# Patient Record
Sex: Female | Born: 1960 | Race: White | Hispanic: No | Smoking: Current every day smoker
Health system: Southern US, Community
[De-identification: ages and names within clinical notes are randomized; demographics above are authoritative.]

## PROBLEM LIST (undated history)

## (undated) DIAGNOSIS — J449 Chronic obstructive pulmonary disease, unspecified: Secondary | ICD-10-CM

## (undated) DIAGNOSIS — R519 Headache, unspecified: Secondary | ICD-10-CM

## (undated) DIAGNOSIS — G620 Drug-induced polyneuropathy: Secondary | ICD-10-CM

## (undated) DIAGNOSIS — F1910 Other psychoactive substance abuse, uncomplicated: Secondary | ICD-10-CM

## (undated) DIAGNOSIS — Z5189 Encounter for other specified aftercare: Secondary | ICD-10-CM

## (undated) DIAGNOSIS — E7800 Pure hypercholesterolemia, unspecified: Secondary | ICD-10-CM

## (undated) DIAGNOSIS — C50919 Malignant neoplasm of unspecified site of unspecified female breast: Secondary | ICD-10-CM

## (undated) DIAGNOSIS — F419 Anxiety disorder, unspecified: Secondary | ICD-10-CM

## (undated) DIAGNOSIS — R112 Nausea with vomiting, unspecified: Secondary | ICD-10-CM

## (undated) DIAGNOSIS — J439 Emphysema, unspecified: Secondary | ICD-10-CM

## (undated) DIAGNOSIS — G629 Polyneuropathy, unspecified: Secondary | ICD-10-CM

## (undated) DIAGNOSIS — F32A Depression, unspecified: Secondary | ICD-10-CM

## (undated) DIAGNOSIS — R51 Headache: Secondary | ICD-10-CM

## (undated) DIAGNOSIS — Z9889 Other specified postprocedural states: Secondary | ICD-10-CM

## (undated) DIAGNOSIS — T451X5A Adverse effect of antineoplastic and immunosuppressive drugs, initial encounter: Secondary | ICD-10-CM

## (undated) DIAGNOSIS — F329 Major depressive disorder, single episode, unspecified: Secondary | ICD-10-CM

## (undated) DIAGNOSIS — K219 Gastro-esophageal reflux disease without esophagitis: Secondary | ICD-10-CM

## (undated) DIAGNOSIS — F4310 Post-traumatic stress disorder, unspecified: Secondary | ICD-10-CM

## (undated) DIAGNOSIS — T7840XA Allergy, unspecified, initial encounter: Secondary | ICD-10-CM

## (undated) DIAGNOSIS — E785 Hyperlipidemia, unspecified: Secondary | ICD-10-CM

## (undated) HISTORY — DX: Polyneuropathy, unspecified: G62.9

## (undated) HISTORY — DX: Depression, unspecified: F32.A

## (undated) HISTORY — PX: MASTECTOMY: SHX3

## (undated) HISTORY — DX: Anxiety disorder, unspecified: F41.9

## (undated) HISTORY — DX: Malignant neoplasm of unspecified site of unspecified female breast: C50.919

## (undated) HISTORY — DX: Nausea with vomiting, unspecified: R11.2

## (undated) HISTORY — DX: Encounter for other specified aftercare: Z51.89

## (undated) HISTORY — DX: Hyperlipidemia, unspecified: E78.5

## (undated) HISTORY — PX: TUBAL LIGATION: SHX77

## (undated) HISTORY — DX: Headache: R51

## (undated) HISTORY — DX: Adverse effect of antineoplastic and immunosuppressive drugs, initial encounter: T45.1X5A

## (undated) HISTORY — DX: Other psychoactive substance abuse, uncomplicated: F19.10

## (undated) HISTORY — PX: BREAST SURGERY: SHX581

## (undated) HISTORY — DX: Headache, unspecified: R51.9

## (undated) HISTORY — DX: Gastro-esophageal reflux disease without esophagitis: K21.9

## (undated) HISTORY — DX: Chronic obstructive pulmonary disease, unspecified: J44.9

## (undated) HISTORY — DX: Major depressive disorder, single episode, unspecified: F32.9

## (undated) HISTORY — DX: Emphysema, unspecified: J43.9

## (undated) HISTORY — DX: Other specified postprocedural states: Z98.890

## (undated) HISTORY — DX: Drug-induced polyneuropathy: G62.0

## (undated) HISTORY — DX: Allergy, unspecified, initial encounter: T78.40XA

---

## 1999-02-22 ENCOUNTER — Emergency Department (HOSPITAL_COMMUNITY): Admission: EM | Admit: 1999-02-22 | Discharge: 1999-02-22 | Payer: Self-pay | Admitting: Emergency Medicine

## 1999-02-22 ENCOUNTER — Encounter: Payer: Self-pay | Admitting: Emergency Medicine

## 1999-03-21 ENCOUNTER — Ambulatory Visit (HOSPITAL_COMMUNITY): Admission: RE | Admit: 1999-03-21 | Discharge: 1999-03-21 | Payer: Self-pay | Admitting: Orthopedic Surgery

## 1999-03-21 ENCOUNTER — Encounter: Payer: Self-pay | Admitting: Orthopedic Surgery

## 1999-06-12 ENCOUNTER — Encounter: Payer: Self-pay | Admitting: Emergency Medicine

## 1999-06-12 ENCOUNTER — Emergency Department (HOSPITAL_COMMUNITY): Admission: EM | Admit: 1999-06-12 | Discharge: 1999-06-12 | Payer: Self-pay | Admitting: Emergency Medicine

## 1999-07-17 ENCOUNTER — Encounter: Admission: RE | Admit: 1999-07-17 | Discharge: 1999-10-15 | Payer: Self-pay | Admitting: *Deleted

## 2000-04-09 ENCOUNTER — Encounter: Admission: RE | Admit: 2000-04-09 | Discharge: 2000-04-09 | Payer: Self-pay | Admitting: Family Medicine

## 2000-04-15 ENCOUNTER — Encounter: Payer: Self-pay | Admitting: *Deleted

## 2000-04-15 ENCOUNTER — Encounter: Admission: RE | Admit: 2000-04-15 | Discharge: 2000-04-15 | Payer: Self-pay | Admitting: *Deleted

## 2001-04-27 ENCOUNTER — Encounter: Payer: Self-pay | Admitting: Emergency Medicine

## 2001-04-27 ENCOUNTER — Emergency Department (HOSPITAL_COMMUNITY): Admission: EM | Admit: 2001-04-27 | Discharge: 2001-04-27 | Payer: Self-pay | Admitting: Emergency Medicine

## 2002-10-05 ENCOUNTER — Emergency Department (HOSPITAL_COMMUNITY): Admission: EM | Admit: 2002-10-05 | Discharge: 2002-10-05 | Payer: Self-pay | Admitting: Emergency Medicine

## 2005-12-25 ENCOUNTER — Ambulatory Visit (HOSPITAL_COMMUNITY): Admission: RE | Admit: 2005-12-25 | Discharge: 2005-12-25 | Payer: Self-pay | Admitting: Family Medicine

## 2006-06-12 ENCOUNTER — Ambulatory Visit (HOSPITAL_COMMUNITY): Admission: RE | Admit: 2006-06-12 | Discharge: 2006-06-12 | Payer: Self-pay | Admitting: Family Medicine

## 2006-06-12 ENCOUNTER — Encounter (INDEPENDENT_AMBULATORY_CARE_PROVIDER_SITE_OTHER): Payer: Self-pay | Admitting: Diagnostic Radiology

## 2006-06-12 ENCOUNTER — Encounter (INDEPENDENT_AMBULATORY_CARE_PROVIDER_SITE_OTHER): Payer: Self-pay | Admitting: *Deleted

## 2006-06-24 ENCOUNTER — Encounter: Admission: RE | Admit: 2006-06-24 | Discharge: 2006-06-24 | Payer: Self-pay | Admitting: Family Medicine

## 2006-06-24 ENCOUNTER — Encounter (INDEPENDENT_AMBULATORY_CARE_PROVIDER_SITE_OTHER): Payer: Self-pay | Admitting: *Deleted

## 2006-06-26 ENCOUNTER — Encounter: Admission: RE | Admit: 2006-06-26 | Discharge: 2006-06-26 | Payer: Self-pay | Admitting: General Surgery

## 2006-07-05 ENCOUNTER — Encounter (INDEPENDENT_AMBULATORY_CARE_PROVIDER_SITE_OTHER): Payer: Self-pay | Admitting: General Surgery

## 2006-07-05 ENCOUNTER — Encounter (INDEPENDENT_AMBULATORY_CARE_PROVIDER_SITE_OTHER): Payer: Self-pay | Admitting: Specialist

## 2006-07-05 ENCOUNTER — Inpatient Hospital Stay (HOSPITAL_COMMUNITY): Admission: RE | Admit: 2006-07-05 | Discharge: 2006-07-08 | Payer: Self-pay | Admitting: General Surgery

## 2006-07-24 ENCOUNTER — Encounter (HOSPITAL_COMMUNITY): Admission: RE | Admit: 2006-07-24 | Discharge: 2006-08-23 | Payer: Self-pay | Admitting: Oncology

## 2006-07-24 ENCOUNTER — Ambulatory Visit (HOSPITAL_COMMUNITY): Payer: Self-pay | Admitting: Oncology

## 2006-07-25 ENCOUNTER — Ambulatory Visit: Payer: Self-pay | Admitting: Internal Medicine

## 2006-07-29 ENCOUNTER — Ambulatory Visit (HOSPITAL_COMMUNITY): Admission: RE | Admit: 2006-07-29 | Discharge: 2006-07-29 | Payer: Self-pay | Admitting: General Surgery

## 2006-08-08 ENCOUNTER — Emergency Department (HOSPITAL_COMMUNITY): Admission: EM | Admit: 2006-08-08 | Discharge: 2006-08-08 | Payer: Self-pay | Admitting: Emergency Medicine

## 2006-08-28 ENCOUNTER — Encounter (HOSPITAL_COMMUNITY): Admission: RE | Admit: 2006-08-28 | Discharge: 2006-09-27 | Payer: Self-pay | Admitting: Oncology

## 2006-09-05 ENCOUNTER — Ambulatory Visit: Payer: Self-pay | Admitting: Cardiology

## 2006-09-12 ENCOUNTER — Ambulatory Visit (HOSPITAL_COMMUNITY): Payer: Self-pay | Admitting: Oncology

## 2006-10-02 ENCOUNTER — Encounter (HOSPITAL_COMMUNITY): Admission: RE | Admit: 2006-10-02 | Discharge: 2006-11-01 | Payer: Self-pay | Admitting: Oncology

## 2006-11-04 ENCOUNTER — Ambulatory Visit: Admission: RE | Admit: 2006-11-04 | Discharge: 2007-01-07 | Payer: Self-pay | Admitting: Radiation Oncology

## 2006-11-25 ENCOUNTER — Encounter (HOSPITAL_COMMUNITY): Admission: RE | Admit: 2006-11-25 | Discharge: 2006-12-25 | Payer: Self-pay | Admitting: Oncology

## 2006-11-29 ENCOUNTER — Ambulatory Visit (HOSPITAL_COMMUNITY): Payer: Self-pay | Admitting: Oncology

## 2007-01-20 ENCOUNTER — Ambulatory Visit (HOSPITAL_COMMUNITY): Payer: Self-pay | Admitting: Oncology

## 2007-01-20 ENCOUNTER — Encounter (HOSPITAL_COMMUNITY): Admission: RE | Admit: 2007-01-20 | Discharge: 2007-02-04 | Payer: Self-pay | Admitting: Oncology

## 2007-03-03 ENCOUNTER — Encounter (HOSPITAL_COMMUNITY): Admission: RE | Admit: 2007-03-03 | Discharge: 2007-04-02 | Payer: Self-pay | Admitting: Oncology

## 2007-03-18 ENCOUNTER — Encounter (INDEPENDENT_AMBULATORY_CARE_PROVIDER_SITE_OTHER): Payer: Self-pay | Admitting: Internal Medicine

## 2007-03-18 ENCOUNTER — Other Ambulatory Visit: Admission: RE | Admit: 2007-03-18 | Discharge: 2007-03-18 | Payer: Self-pay | Admitting: Internal Medicine

## 2007-05-12 ENCOUNTER — Encounter (HOSPITAL_COMMUNITY): Admission: RE | Admit: 2007-05-12 | Discharge: 2007-06-11 | Payer: Self-pay | Admitting: Oncology

## 2007-05-21 ENCOUNTER — Emergency Department (HOSPITAL_COMMUNITY): Admission: EM | Admit: 2007-05-21 | Discharge: 2007-05-21 | Payer: Self-pay | Admitting: Emergency Medicine

## 2007-05-27 ENCOUNTER — Ambulatory Visit (HOSPITAL_COMMUNITY): Payer: Self-pay | Admitting: Oncology

## 2007-06-06 ENCOUNTER — Ambulatory Visit (HOSPITAL_COMMUNITY): Admission: RE | Admit: 2007-06-06 | Discharge: 2007-06-06 | Payer: Self-pay | Admitting: Neurology

## 2007-08-11 ENCOUNTER — Ambulatory Visit (HOSPITAL_COMMUNITY): Payer: Self-pay | Admitting: Oncology

## 2007-08-11 ENCOUNTER — Encounter (HOSPITAL_COMMUNITY): Admission: RE | Admit: 2007-08-11 | Discharge: 2007-09-10 | Payer: Self-pay | Admitting: Oncology

## 2007-11-16 ENCOUNTER — Emergency Department (HOSPITAL_COMMUNITY): Admission: EM | Admit: 2007-11-16 | Discharge: 2007-11-17 | Payer: Self-pay | Admitting: Emergency Medicine

## 2008-02-11 ENCOUNTER — Encounter (HOSPITAL_COMMUNITY): Admission: RE | Admit: 2008-02-11 | Discharge: 2008-03-12 | Payer: Self-pay | Admitting: Oncology

## 2008-02-11 ENCOUNTER — Ambulatory Visit (HOSPITAL_COMMUNITY): Payer: Self-pay | Admitting: Oncology

## 2008-03-02 ENCOUNTER — Ambulatory Visit: Payer: Self-pay | Admitting: Gastroenterology

## 2008-03-10 ENCOUNTER — Encounter: Payer: Self-pay | Admitting: Gastroenterology

## 2008-03-10 ENCOUNTER — Ambulatory Visit: Payer: Self-pay | Admitting: Gastroenterology

## 2008-03-10 ENCOUNTER — Ambulatory Visit (HOSPITAL_COMMUNITY): Admission: RE | Admit: 2008-03-10 | Discharge: 2008-03-10 | Payer: Self-pay | Admitting: Gastroenterology

## 2008-03-10 HISTORY — PX: OTHER SURGICAL HISTORY: SHX169

## 2008-03-10 HISTORY — PX: ESOPHAGOGASTRODUODENOSCOPY: SHX1529

## 2008-04-20 ENCOUNTER — Emergency Department (HOSPITAL_COMMUNITY): Admission: EM | Admit: 2008-04-20 | Discharge: 2008-04-20 | Payer: Self-pay | Admitting: Emergency Medicine

## 2008-04-27 ENCOUNTER — Emergency Department (HOSPITAL_COMMUNITY): Admission: EM | Admit: 2008-04-27 | Discharge: 2008-04-27 | Payer: Self-pay | Admitting: Emergency Medicine

## 2008-08-11 ENCOUNTER — Ambulatory Visit (HOSPITAL_COMMUNITY): Payer: Self-pay | Admitting: Oncology

## 2009-02-14 ENCOUNTER — Ambulatory Visit (HOSPITAL_COMMUNITY): Payer: Self-pay | Admitting: Oncology

## 2009-04-06 ENCOUNTER — Encounter: Admission: RE | Admit: 2009-04-06 | Discharge: 2009-04-06 | Payer: Self-pay | Admitting: Family Medicine

## 2009-07-11 ENCOUNTER — Encounter: Admission: RE | Admit: 2009-07-11 | Discharge: 2009-07-11 | Payer: Self-pay | Admitting: Family Medicine

## 2009-08-15 ENCOUNTER — Ambulatory Visit (HOSPITAL_COMMUNITY): Payer: Self-pay | Admitting: Oncology

## 2009-08-15 ENCOUNTER — Encounter (HOSPITAL_COMMUNITY): Admission: RE | Admit: 2009-08-15 | Discharge: 2009-09-14 | Payer: Self-pay | Admitting: Oncology

## 2009-09-01 ENCOUNTER — Ambulatory Visit (HOSPITAL_BASED_OUTPATIENT_CLINIC_OR_DEPARTMENT_OTHER): Admission: RE | Admit: 2009-09-01 | Discharge: 2009-09-01 | Payer: Self-pay | Admitting: Orthopedic Surgery

## 2010-02-28 ENCOUNTER — Encounter (HOSPITAL_COMMUNITY): Admission: RE | Admit: 2010-02-28 | Payer: Self-pay | Admitting: Neurosurgery

## 2010-03-28 ENCOUNTER — Encounter (HOSPITAL_COMMUNITY)
Admission: RE | Admit: 2010-03-28 | Discharge: 2010-04-27 | Payer: Self-pay | Source: Home / Self Care | Attending: Oncology | Admitting: Oncology

## 2010-03-28 ENCOUNTER — Ambulatory Visit (HOSPITAL_COMMUNITY): Payer: Self-pay | Admitting: Oncology

## 2010-05-28 ENCOUNTER — Encounter (HOSPITAL_COMMUNITY): Payer: Self-pay | Admitting: Oncology

## 2010-05-29 ENCOUNTER — Encounter (HOSPITAL_COMMUNITY): Payer: Self-pay | Admitting: Oncology

## 2010-07-18 LAB — DIFFERENTIAL
Eosinophils Absolute: 0.2 10*3/uL (ref 0.0–0.7)
Eosinophils Relative: 3 % (ref 0–5)
Lymphs Abs: 2.1 10*3/uL (ref 0.7–4.0)

## 2010-07-18 LAB — COMPREHENSIVE METABOLIC PANEL
ALT: 14 U/L (ref 0–35)
AST: 18 U/L (ref 0–37)
CO2: 28 mEq/L (ref 19–32)
Calcium: 9.5 mg/dL (ref 8.4–10.5)
Chloride: 107 mEq/L (ref 96–112)
GFR calc Af Amer: >60 mL/min (ref >60)
GFR calc non Af Amer: >60 mL/min (ref >60)
Sodium: 141 mEq/L (ref 135–145)
Total Bilirubin: 0.4 mg/dL (ref 0.3–1.2)

## 2010-07-18 LAB — CANCER ANTIGEN 27.29: CA 27.29: 11 U/mL (ref 0–39)

## 2010-07-18 LAB — CBC
Hemoglobin: 14 g/dL (ref 12.0–15.0)
RBC: 4.54 MIL/uL (ref 3.87–5.11)

## 2010-07-18 LAB — SEDIMENTATION RATE: Sed Rate: 10 mm/hr (ref 0–22)

## 2010-07-26 LAB — DIFFERENTIAL
Basophils Absolute: 0 10*3/uL (ref 0.0–0.1)
Basophils Relative: 1 % (ref 0–1)
Lymphocytes Relative: 35 % (ref 12–46)
Neutro Abs: 3.2 10*3/uL (ref 1.7–7.7)
Neutrophils Relative %: 57 % (ref 43–77)

## 2010-07-26 LAB — CBC
HCT: 37.7 % (ref 36.0–46.0)
Hemoglobin: 13.1 g/dL (ref 12.0–15.0)
MCV: 86.1 fL (ref 78.0–100.0)
Platelets: 281 10*3/uL (ref 150–400)
WBC: 5.5 10*3/uL (ref 4.0–10.5)

## 2010-07-26 LAB — COMPREHENSIVE METABOLIC PANEL
BUN: 7 mg/dL (ref 6–23)
CO2: 27 mEq/L (ref 19–32)
Chloride: 103 mEq/L (ref 96–112)
Creatinine, Ser: 0.56 mg/dL (ref 0.4–1.2)
GFR calc non Af Amer: >60 mL/min (ref >60)
Glucose, Bld: 82 mg/dL (ref 70–99)
Total Bilirubin: 0.3 mg/dL (ref 0.3–1.2)

## 2010-09-19 NOTE — Procedures (Signed)
[name], Judy               ACCOUNT NO.:  [id number]   MEDICAL RECORD NO.:  [id number]           PATIENT TYPE:  REC   LOCATION:                                FACILITY:  [facility]   PHYSICIAN:  Gerrit Friends. Dietrich Pates, MD, FACCDATE OF BIRTH:  04-30-1961   DATE OF PROCEDURE:  09/05/2006  DATE OF DISCHARGE:                                ECHOCARDIOGRAM   REFERRING:  Nystrom.   CLINICAL DATA:  A 50 year old woman receiving chemotherapy.   Aorta 3.0, left atrium 3.1, septum 0.9, posterior wall 0.8, LV diastole  4.9, LV systole 3.5.  1. Technically adequate echocardiographic study.  2. Normal left atrium and right atrium.  3. Right ventricular size is prominent from the parasternal long axis      but normal from other views; no RVH; normal RV systolic function.  4. Normal pulmonic valve and proximal pulmonary artery.  5. Normal proximal ascending aorta; mild calcification of the wall.  6. Normal and trileaflet aortic valve.  7. Normal mitral and tricuspid valve; physiologic tricuspid      regurgitation.  8. Normal left ventricular size; no LVH; normal regional and global LV      systolic function.  Estimated ejection fraction is 0.65.  9. Normal IVC.      Gerrit Friends. Dietrich Pates, MD, [facility]  Electronically Signed     RMR/MEDQ  D:  09/05/2006  T:  09/05/2006  Job:  [id number]

## 2010-09-19 NOTE — Op Note (Signed)
[name], [name]               ACCOUNT NO.:  [id number]   MEDICAL RECORD NO.:  [id number]          PATIENT TYPE:  AMB   LOCATION:  DAY                           FACILITY:  [facility]   PHYSICIAN:  Kassie Mends, M.D.      DATE OF BIRTH:  [birth year]   DATE OF PROCEDURE:  03/10/2008  DATE OF DISCHARGE:                               OPERATIVE REPORT   REFERRING PROVIDERS:  1. [facility] Department.  2. Ladona Horns. Neijstrom, MD   PROCEDURES:  1. Ileocolonoscopy with random cold forceps biopsy.  2. Esophagogastroduodenoscopy with cold forceps biopsy of the gastric      and duodenal mucosa.   INDICATION FOR EXAMINATION:  [name] is a 50 year old female who  complains of intermittent nausea, vomiting, and diarrhea since the age  of 28.  She initially thought it was related to a kerosene heater.  She  denies any problem swallowing.  She reports not eating much, but she  has not had any weight loss.  Her BMI is 32.4(obese). She says she  snacks on bologna sandwiches and Little Debbies.  She has been taking  Prilosec.  She reports taking Prilosec daily for the last 6 months.  She  reports having 2-3 loose stools a day.  She does take ibuprofen daily.  She had breast cancer in February 2008, which resulted in a mastectomy,  radiation therapy, and chemotherapy.   FINDINGS:  1. Normal terminal ileum, approximately 10 cm visualized.  2. Normal colon without evidence of polyps, mass, inflammatory      changes, diverticula, or AVMs.  Biopsies obtained via cold forceps      to evaluate for microscopic colitis.  3. Normal retroflexed view of the rectum.  The retroflexed view had to      be performed with a therapeutic gastroscope.  4. Normal esophagus without evidence of Barrett, mass, erosion,      ulceration, or stricture.  5. Patchy erythema in the antrum without erosion or ulceration.      Biopsies obtained via cold forceps to evaluate for Helicobacter      pylori gastritis.  6.  Normal duodenal bulb and second portion of the duodenum.  Mild bile      staining.  Biopsies obtained via cold forceps to evaluate for      celiac sprue as an etiology for her chronic diarrhea.   RECOMMENDATIONS:  1. Screening colonoscopy in 10 years.  She should follow a lactose-      free and high-fiber diet.  She was given a handout on both.  2. She should continue the Prilosec and take it 30 minutes before her      first meal.  3. No aspirin, NSAIDs, or anticoagulation for 7 days.  She may use      Tylenol for headaches.  4. She was given information on gastritis.  5. If her biopsies do not reveal an etiology for nausea, vomiting, and      diarrhea, then would consider a gastric emptying study or a breath      test for lactose or fructose intolerance.  MEDICATIONS:  1. Demerol 100 mg IV.  2. Versed 4 mg IV.  3. Phenergan 25 mg IV.   PROCEDURE TECHNIQUE:  Physical exam was performed.  Informed consent was  obtained from the patient after explaining the benefits, risks, and  alternatives of the procedure.  The patient was connected to the monitor  and placed in the left lateral position.  Continuous oxygen was provided  by nasal cannula.  IV medicine administered through an indwelling  cannula.  After administration of sedation and rectal exam, the  patient's rectum was intubated and the scope was advanced under direct  visualization to the distal terminal ileum.  The scope was removed  slowly by carefully examining the color, texture, anatomy, and integrity  of the mucosa on the way out.   After the colonoscopy, the patient's esophagus was intubated with a  diagnostic gastroscope and the scope was advanced under direct  visualization to the second portion of the duodenum.  The scope was  removed slowly by carefully examining the color, texture, anatomy, and  integrity of the mucosa on the way out.  The patient was recovered in  endoscopy and discharged home in satisfactory  condition.   PATH:  Reactive gastropathy. Nl duodenum/colon.      Kassie Mends, M.D.  Electronically Signed     SM/MEDQ  D:  03/10/2008  T:  03/10/2008  Job:  [id number]   cc:   [facility] Department   Ladona Horns. Mariel Sleet, MD  Fax: [phone]

## 2010-09-19 NOTE — Procedures (Signed)
[name], [name]               ACCOUNT NO.:  [id number]   MEDICAL RECORD NO.:  [id number]          PATIENT TYPE:  REC   LOCATION:  RAD                           FACILITY:  [facility]   PHYSICIAN:  Madaline Savage, M.D.DATE OF BIRTH:  1960/12/28   DATE OF PROCEDURE:  10/02/2006  DATE OF DISCHARGE:                                ECHOCARDIOGRAM   INDICATIONS FOR PROCEDURE:  Left ventricular function assessment in a  patient with breast cancer.   Technically this study is suboptimal for interpretation results.   Left ventricle:  Left ventricular size is normal with LV systolic  dimension being 3.3 and LV diastolic dimension 4.6.  There is normal LV  size overall.  The septal and posterior wall dimensions of the left  ventricle are normal at between 0.7 and 0.9.  Left ventricular systolic  function looks to be between 45 and 55.  Segmental wall motion  abnormalities are not seen but cannot be ruled out due to the technical  quality of the study.  No obvious LV thrombus was seen.   Right ventricle:  Not well seen.   Right atrium:  Not well seen.   Left atrium:  Normal left atrial size 1.9.   Aorta:  Normal.  Normal aortic root dimension of 2.4.   Aortic valve:  Aortic valve was normal in appearance with no evidence of  stenosis or regurgitation.   Mitral valve looked normal.   Pulmonic valve was not seen.   Tricuspid valve was not seen.   Pericardium:  No obvious effusion.   FINAL DIAGNOSIS:  1. Left ventricular systolic function as described above.  2. No evidence of any major valvular pathology.           ______________________________  Madaline Savage, M.D.     WHG/MEDQ  D:  10/02/2006  T:  10/03/2006  Job:  [id number]   cc:   Ladona Horns. Mariel Sleet, MD  Fax: [phone]

## 2010-09-19 NOTE — Consult Note (Signed)
NAME:  [name], [name]               ACCOUNT NO.:  [id number]   MEDICAL RECORD NO.:  [id number]          PATIENT TYPE:  AMB   LOCATION:  DAY                           FACILITY:  [facility]   PHYSICIAN:  Kassie Mends, M.D.      DATE OF BIRTH:  1960-12-24   DATE OF CONSULTATION:  03/02/2008  DATE OF DISCHARGE:                                 CONSULTATION   REASON FOR CONSULTATION:  Chronic nausea, vomiting, and diarrhea.   REQUESTING PHYSICIAN:  Ladona Horns. Mariel Sleet, MD.   PRIMARY CARE PHYSICIAN:  [facility] Department.   HISTORY OF PRESENT ILLNESS:  The patient is a 50 year old lady who has a  history of bilateral breast cancer, status post right modified radical  mastectomy, in the left simple mastectomy back in February 2008, status  post radiation therapy and chemotherapy who presents today for further  evaluation of chronic vomiting and diarrhea.  Currently, she is in the  final stages of her reconstructive surgery.  She has finished her cancer  treatments.  She states for the past 10 years, she has had chronic  vomiting and diarrhea.  She may have vomiting daily for 2 weeks and then  may go a few weeks without any symptoms.  Generally, she wakes up early  in the morning with nausea.  Progresses throughout the morning and it  may last all day.  In between episodes of vomiting, she continues to  eat.  She generally only eats liquid diet, however.  She also complains  of chronic diarrhea.  She is very difficult in giving a history,  however.  She notes little more food she eats, more diarrhea she does  have.  Generally, she may have 2 or 3 stools a day.  Lately, she does  feel that she has been consuming very much and therefore, has not had 2  bouts of diarrhea.  She notes however, that she has a long-standing  weight.  She does have some intermittent heartburn, but takes Prilosec  as needed.  She denies any dysphagia or odynophagia.  She states a  couple of weeks ago, she had  black stool.  This may have been related to  Pepto-Bismol.  She had been on ibuprofen about 400 mg 3 times a day up  until last week when her Celexa and Ativan were increased.  She was  taking ibuprofen for headaches.   CURRENT MEDICATIONS:  1. Celexa 40 mg daily.  2. Wellbutrin 300 mg daily.  3. Ativan 1 mg t.i.d.  4. Prilosec 20 mg daily p.r.n.  5. Ibuprofen 400 mg t.i.d. p.r.n.   ALLERGIES:  No known drug allergies.   PAST MEDICAL HISTORY:  Bilateral breast cancer diagnosed in February  2008, status post resection, chemo and radiation therapy.  She has  reconstructive surgery in December 2008.  Port-A-Cath placed on March  2008.  Tubal ligation in 1990.  On chest CT, she had apparent mild COPD.   FAMILY HISTORY:  Negative for breast cancer.  She has a brother with  hepatitis C.  One brother died with a drug overdose.  Father had skin  cancer.  Mother has history of MI, aneurysm, and emphysema.   SOCIAL HISTORY:  She is single.  She has 2 children.  She is unemployed.  She smokes half pack of cigarettes daily, smoked for over 13 years.  Rarely consumes alcohol.   REVIEW OF SYSTEMS:  See HPI for GI and constitutional.  CARDIOPULMONARY:  She denies any shortness of breath, dyspnea on exertion, chest pain,  palpitations, or cough.  GENITOURINARY:  Denies any dysuria or  hematuria.  She does not have any menses.   PHYSICAL EXAMINATION:  VITAL SIGNS:  Weight 189, height 5 feet 4 inches,  temperature 97.5, blood pressure 116/64, and pulse 84.  GENERAL:  Pleasant, well-nourished, and well-developed Caucasian female,  in no acute distress.  SKIN:  Warm and dry.  No jaundice.  HEENT:  Sclerae nonicteric.  Oropharyngeal mucosa moist and pink.  No  lesions, erythema, or exudate.  No lymphadenopathy or thyromegaly.  CHEST:  Lungs are clear to auscultation.  CARDIAC:  Regular rate and rhythm.  Normal S1 and S2.  No murmurs, rubs,  or gallops.  ABDOMEN:  Positive bowel sounds.  Abdomen,  soft, nondistended, and  nontender.  No organomegaly or masses.  No rebound or guarding.  No  abdominal bruits or hernias.  LOWER EXTREMITIES:  No edema.   PLAN:  1. Hemoccult stool x3.  2. Retrieve labs from Dr. Thornton Papas office done in a couple of weeks      ago.  3. She will continue Prilosec to take it 20 mg daily for now.  4. Further recommendations and to follow pending labs.  I suspect, the      patient will need to have EGD and colonoscopy for further      evaluation of her symptoms.      Tana Coast, P.A.      Kassie Mends, M.D.  Electronically Signed    LL/MEDQ  D:  03/02/2008  T:  03/03/2008  Job:  [id number]   cc:   Ladona Horns. Mariel Sleet, MD  Fax: [phone]   [facility] Department

## 2010-09-22 NOTE — Discharge Summary (Signed)
NAME:  [name], [name] NO.:  [id number]   MEDICAL RECORD NO.:  [id number]          PATIENT TYPE:  INP   LOCATION:  A311                          FACILITY:  [facility]   PHYSICIAN:  Dalia Heading, M.D.  DATE OF BIRTH:  1961/03/06   DATE OF ADMISSION:  07/05/2006  DATE OF DISCHARGE:  03/03/2008LH                               DISCHARGE SUMMARY   HOSPITAL COURSE SUMMARY:  The patient is a 50 year old white female with  a history of right breast carcinoma and a highly suspicious neoplasm in  the left breast.  She subsequently came to the hospital on 07/05/2006  and underwent a right modified radical mastectomy as well as a left  simple mastectomy.  She tolerated the procedures well.  The  postoperative course was remarkable for a hematoma that developed below  the left breast incision.  This was evacuated at bedside on  postoperative day  #1.  She did receive 1 unit packed red blood cells for surgical anemia.  Her hematocrit subsequently corrected to 30.  Her diet was advanced  without difficulty.  Final pathology is pending.   FOLLOW UP:  The patient is to follow up with Dr. Franky Macho on  07/11/2006 .   DISCHARGE MEDICATIONS:  Include Xanax p.r.n., Vicodin 1-2 tablets p.o.  q.4 h p.r.n. pain, multivitamin 1 tablet p.o. daily.  She is to drain  and record her bulb suction in the right axilla twice a day.   PRINCIPAL DIAGNOSES:  1. Right breast carcinoma.  2. Possible left breast carcinoma, final pathology pending.  3. Anemia, secondary to surgery.   PRINCIPAL PROCEDURE:  Right modified radical mastectomy, left simple  mastectomy on 07/05/2006.  Please send a copy of this dictation to my  office to the chart and to [facility] Department thank you      Dalia Heading, M.D.  Electronically Signed     MAJ/MEDQ  D:  07/08/2006  T:  07/08/2006  Job:  [id number]   cc:   Dalia Heading, M.D.  Fax: [phone]   [facility] Department

## 2010-09-22 NOTE — H&P (Signed)
NAME:  [name], [name]               ACCOUNT NO.:  [id number]   MEDICAL RECORD NO.:  [id number]          PATIENT TYPE:  AMB   LOCATION:  DAY                           FACILITY:  [facility]   PHYSICIAN:  Dalia Heading, M.D.  DATE OF BIRTH:  [birth year]   DATE OF ADMISSION:  DATE OF DISCHARGE:  LH                              HISTORY & PHYSICAL   CHIEF COMPLAINT:  Right breast carcinoma, question left breast carcinoma  with atypia.   HISTORY OF PRESENT ILLNESS:  The patient is a 50 year old white female  who is referred for evaluation and treatment of a right breast cancer.  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  She has had an MRI which shows these 2 areas, but no other  evidence of disease.  She has no family history of breast carcinoma.  After extensive discussion with the patient, she would like to proceed  with bilateral mastectomies.   PAST MEDICAL HISTORY:  Unremarkable.   PAST SURGICAL HISTORY:  Unremarkable.   CURRENT MEDICATIONS:  None.   ALLERGIES:  No known drug allergies.   REVIEW OF SYSTEMS:  The patient smokes a pack of cigarettes a day.  She  drinks alcohol rarely.   PHYSICAL EXAMINATION:  GENERAL:  The patient is a well-developed, well-  nourished white female in no acute distress.  NECK:  Supple without lymphadenopathy.  LUNGS:  Clear to auscultation with equal breath sounds bilaterally.  HEART:  Regular rate and rhythm without S3, S4, or murmurs.  BREASTS:  Right breast examination reveals a dominant mass noted in the  lower, outer quadrant of the breast.  No nipple discharge or dimpling is  noted.  Shoddy lymphadenopathy is noted.  Left breast examination  reveals no dominant mass, nipple discharge, or dimpling.  The axilla is  negative for palpable nodes.   IMPRESSION:  1.  Right breast carcinoma.  2. Left breast neoplasm.   PLAN:  The patient is scheduled for right modified radical mastectomy as  well as left simple mastectomy on July 05, 2006.  The risks and  benefits of the procedures were fully explained to the patient who gave  informed consent.      Dalia Heading, M.D.  Electronically Signed     MAJ/MEDQ  D:  07/04/2006  T:  07/04/2006  Job:  [id number]   cc:   [facility] Department

## 2010-09-22 NOTE — Op Note (Signed)
[name], [name]               ACCOUNT NO.:  [id number]   MEDICAL RECORD NO.:  [id number]          PATIENT TYPE:  AMB   LOCATION:  DAY                           FACILITY:  [facility]   PHYSICIAN:  Dalia Heading, M.D.  DATE OF BIRTH:  1960-11-06   DATE OF PROCEDURE:  07/29/2006  DATE OF DISCHARGE:                               OPERATIVE REPORT   PREOPERATIVE DIAGNOSIS:  Right breast carcinoma.   POSTOPERATIVE DIAGNOSIS:  Right breast carcinoma.   PROCEDURE:  Port-A-Cath insertion.   SURGEON:  Dalia Heading, M.D.   ANESTHESIA:  MAC.   INDICATIONS:  The patient is a 50 year old white female with a history  of breast carcinoma who now presents for Port-A-Cath insertion for  central venous access for chemotherapy.  The risks and benefits of the  procedure, including bleeding, infection, and pneumothorax were fully  explained to the patient who gave informed consent.   PROCEDURE NOTE:  The patient was placed in the Trendelenburg position  after the left upper chest was prepped and draped using the usual  sterile technique with Betadine.  Surgical site confirmation was  performed.   1% Xylocaine was used for local anesthesia.  An incision was made below  the left clavicle.  A subcutaneous pocket was then formed.  A needle was  advanced into the left subclavian vein using the Seldinger technique  without difficulty.  The guidewire was then advanced into the right  atrium under fluoroscopic guidance.  An introducer and peel-away sheath  were then placed over the guidewire.  The catheter was inserted through  the peel-away sheath, and the peel-away sheath was removed.  The  catheter was then attached to the port and the port placed in the  subcutaneous pocket.  Adequate position was confirmed by fluoroscopy.  The port was flushed with 3000 units of heparin.  The subcutaneous layer  was reapproximated using 3-0 Vicryl interrupted suture.  The skin was  closed using 4-0 Vicryl  subcuticular suture.  Dermabond was then  applied.   All tape and needle counts were correct at the end of the procedure.  The patient was transferred to PACU in stable condition.  A chest x-ray  will be performed at that time.   COMPLICATIONS:  None.   SPECIMEN:  None.   ESTIMATED BLOOD LOSS:  Minimal.      Dalia Heading, M.D.  Electronically Signed     MAJ/MEDQ  D:  07/29/2006  T:  07/29/2006  Job:  [id number]   cc:   [facility] Department   Ladona Horns. Mariel Sleet, MD  Fax: [phone]

## 2010-09-22 NOTE — H&P (Signed)
NAME:  [name], [name]               ACCOUNT NO.:  [id number]   MEDICAL RECORD NO.:  [id number]          PATIENT TYPE:  AMB   LOCATION:  DAY                           FACILITY:  [facility]   PHYSICIAN:  Dalia Heading, M.D.  DATE OF BIRTH:  [birth year]   DATE OF ADMISSION:  DATE OF DISCHARGE:  LH                              HISTORY & PHYSICAL   CHIEF COMPLAINT:  Right breast carcinoma, need for central venous  access.   HISTORY OF PRESENT ILLNESS:  The patient is a 50 year old white female,  status post bilateral mastectomies for breast cancer.  She is now about  to undergo chemotherapy and needs a Port-A-Cath placed.   PAST MEDICAL HISTORY:  Is unremarkable.   PAST SURGICAL HISTORY:  Bilateral mastectomies on July 05, 2006.   CURRENT MEDICATIONS:  Vicodin p.r.n. pain.   ALLERGIES:  NO KNOWN DRUG ALLERGIES.   REVIEW OF SYSTEMS:  The patient smokes a pack cigarettes a day.  She  drinks alcohol rarely.   PHYSICAL EXAMINATION:  GENERAL:  The patient is a well-developed, well-  nourished white female in no acute distress.  LUNGS:  Clear to auscultation with equal breath sounds bilaterally.  HEART:  Examination reveals a regular rate and rhythm without S3, S4, or  murmurs.  Bilateral breast examination reveals well-healed mastectomy  scars.   IMPRESSION:  Right breast carcinoma, need for central venous access.   PLAN:  The patient is scheduled for Port-A-Cath insertion on July 29, 2006.  The risks and benefits of procedure including bleeding,  infection, and pneumothorax were fully explained to the patient, gave  informed consent.      Dalia Heading, M.D.  Electronically Signed     MAJ/MEDQ  D:  07/25/2006  T:  07/25/2006  Job:  [id number]   cc:   Dalia Heading, M.D.  Fax: [phone]   [facility]   [facility] Department   Ladona Horns. Mariel Sleet, MD  Fax: [phone]

## 2010-09-22 NOTE — Op Note (Signed)
[name], [name]               ACCOUNT NO.:  [id number]   MEDICAL RECORD NO.:  [id number]          PATIENT TYPE:  INP   LOCATION:  A311                          FACILITY:  [facility]   PHYSICIAN:  Dalia Heading, M.D.  DATE OF BIRTH:  1961-01-21   DATE OF PROCEDURE:  07/05/2006  DATE OF DISCHARGE:                               OPERATIVE REPORT   PREOPERATIVE DIAGNOSIS:  Bilateral breast carcinomas   POSTOPERATIVE DIAGNOSIS:  Bilateral breast carcinomas   PROCEDURE:  Right modified radical mastectomy, left simple mastectomy.   SURGEON:  Dr. Franky Macho.   ANESTHESIA:  General endotracheal.   INDICATIONS:  The patient is a 50 year old white female who presents  with biopsy-proven right breast carcinoma and a biopsy-proven atypical  ductal hyperplasia, with strong suspicion for malignancy.  She is about  to undergo a right modified radical mastectomy as well as left simple  mastectomy.  Risks and benefits of both procedures including bleeding,  infection, nerve injury, and the possibly arm swelling were fully  explained to the patient, who gave informed consent.   PROCEDURE NOTE:  The patient was placed in supine position.  After  induction of general endotracheal anesthesia, both breasts and axilla  were prepped and draped using the usual sterile technique with Betadine.  Surgical site confirmation was performed.   An elliptical incision was made medial lateral around the right nipple.  A superior flap was then formed up close to the clavicle.  An inferior  flap was then formed to the chest wall.  The right breast and fascia  were then removed from the pectoralis major muscle without difficulty.  A short suture was placed superiorly and long suture placed laterally  for orientation purposes.  The breast was sent to pathology for further  examination.  A level II right axillary dissection was then performed.  Care was taken to avoid the long thoracic nerve and the  thoracodorsal  nerve.  Any small vessels were ligated using small clips.  Several  shotty lymph nodes were palpable.  These were removed and sent to  pathology for further examination.  A #10 flat Jackson-Pratt drain was  placed into this region and brought inferiorly at the skin level and  secured using a 3-0 nylon interrupted suture.  The wound was then  irrigated normal saline.  The subcutaneous layer was reapproximated  using 2-0 Vicryl interrupted sutures.  The skin was closed using  staples.   Next, a left simple mastectomy was performed.  An elliptical incision  was made medial lateral around the left nipple.  The superior flap was  then formed up to the clavicle and inferior flap formed to the chest  wall.  The left breast with fascia was then freed away from the  pectoralis major muscle without difficulty.  A short suture was placed  superiorly and a long suture placed laterally to orient the left breast.  The left breast specimen was then sent to pathology for further  examination.  Any bleeding was controlled using Bovie electrocautery.  The wound was irrigated with normal saline.  Subcutaneous layer was  reapproximated using 2-0 Vicryl interrupted suture.  Skin was closed  using staples.  Betadine ointment and dry sterile dressings were applied  to both incisions.   All tape and needle counts were correct at the end of the procedure.  The patient was extubated in the operating room and went back to  recovery room awake in stable condition.   COMPLICATIONS:  None.   SPECIMEN:  Right breast and axilla, left breast.   BLOOD LOSS:  Less than 100 mL.   DRAINS:  Jackson-Pratt drain to right axilla.      Dalia Heading, M.D.  Electronically Signed     MAJ/MEDQ  D:  07/05/2006  T:  07/05/2006  Job:  [id number]   cc:   Health Department [facility]  Fax: [phone]

## 2010-09-22 NOTE — Procedures (Signed)
[name], [name]               ACCOUNT NO.:  [id number]   MEDICAL RECORD NO.:  [id number]         PATIENT TYPE:  SPCL   LOCATION:  DAY                           FACILITY:  [facility]   PHYSICIAN:  Pricilla Riffle, MD, FACCDATE OF BIRTH:  July 09, 1960   DATE OF PROCEDURE:  07/25/2006  DATE OF DISCHARGE:                                ECHOCARDIOGRAM   REFERRING PHYSICIAN:  Ladona Horns. Neijstrom, MD   INDICATIONS:  The patient is a 50 year old with breast cancer to receive  chemotherapy.   RESULTS:  Two-dimensional echocardiogram with echocardiogram Doppler:  Left ventricle is normal in size with an end-diastolic dimension at 45  mm.  Interventricular septum and posterior wall are normal at 9 mm each.   Left atrium normal at 30 mm.  Right atrium and right ventricle are  normal.   The aortic valve is normal with no insufficiency.  Mitral valve is  mildly thickened with no insufficiency.  Tricuspid valve is normal with  trace insufficiency.   Overall, LV systolic function is normal with an LVEF of approximately 55-  60%.  RVEF is normal.   No pericardial effusion is seen.      Pricilla Riffle, MD, [facility]  Electronically Signed     PVR/MEDQ  D:  07/25/2006  T:  07/25/2006  Job:  [id number]

## 2010-09-26 ENCOUNTER — Ambulatory Visit (HOSPITAL_COMMUNITY): Payer: Self-pay | Admitting: *Deleted

## 2010-09-26 ENCOUNTER — Other Ambulatory Visit (HOSPITAL_COMMUNITY): Payer: Self-pay | Admitting: Oncology

## 2010-09-26 ENCOUNTER — Ambulatory Visit (HOSPITAL_COMMUNITY): Payer: Self-pay | Admitting: Oncology

## 2010-09-26 ENCOUNTER — Encounter (HOSPITAL_COMMUNITY): Payer: Medicare Other | Attending: Oncology | Admitting: Oncology

## 2010-09-26 DIAGNOSIS — E042 Nontoxic multinodular goiter: Secondary | ICD-10-CM | POA: Insufficient documentation

## 2010-09-26 DIAGNOSIS — C50919 Malignant neoplasm of unspecified site of unspecified female breast: Secondary | ICD-10-CM

## 2010-09-26 DIAGNOSIS — J4489 Other specified chronic obstructive pulmonary disease: Secondary | ICD-10-CM | POA: Insufficient documentation

## 2010-09-26 DIAGNOSIS — J449 Chronic obstructive pulmonary disease, unspecified: Secondary | ICD-10-CM | POA: Insufficient documentation

## 2010-09-26 DIAGNOSIS — F411 Generalized anxiety disorder: Secondary | ICD-10-CM | POA: Insufficient documentation

## 2010-09-26 DIAGNOSIS — Z79899 Other long term (current) drug therapy: Secondary | ICD-10-CM | POA: Insufficient documentation

## 2010-09-26 DIAGNOSIS — Z853 Personal history of malignant neoplasm of breast: Secondary | ICD-10-CM | POA: Insufficient documentation

## 2010-09-26 LAB — DIFFERENTIAL
Basophils Absolute: 0 10*3/uL (ref 0.0–0.1)
Basophils Relative: 1 % (ref 0–1)
Eosinophils Absolute: 0.1 10*3/uL (ref 0.0–0.7)
Eosinophils Relative: 2 % (ref 0–5)
Lymphocytes Relative: 35 % (ref 12–46)
Lymphs Abs: 2.2 10*3/uL (ref 0.7–4.0)
Monocytes Absolute: 0.4 10*3/uL (ref 0.1–1.0)
Monocytes Relative: 6 % (ref 3–12)
Neutro Abs: 3.7 10*3/uL (ref 1.7–7.7)
Neutrophils Relative %: 57 % (ref 43–77)

## 2010-09-26 LAB — COMPREHENSIVE METABOLIC PANEL
ALT: 13 U/L (ref 0–35)
Albumin: 3.8 g/dL (ref 3.5–5.2)
Alkaline Phosphatase: 70 U/L (ref 39–117)
BUN: 8 mg/dL (ref 6–23)
Chloride: 103 mEq/L (ref 96–112)
Glucose, Bld: 81 mg/dL (ref 70–99)
Potassium: 4.1 mEq/L (ref 3.5–5.1)
Sodium: 139 mEq/L (ref 135–145)
Total Bilirubin: 0.2 mg/dL — ABNORMAL LOW (ref 0.3–1.2)
Total Protein: 6.6 g/dL (ref 6.0–8.3)

## 2010-09-26 LAB — CBC
HCT: 37.7 % (ref 36.0–46.0)
MCV: 91.5 fL (ref 78.0–100.0)
Platelets: 258 10*3/uL (ref 150–400)
RBC: 4.12 MIL/uL (ref 3.87–5.11)
WBC: 6.5 10*3/uL (ref 4.0–10.5)

## 2010-10-24 ENCOUNTER — Emergency Department (HOSPITAL_COMMUNITY)
Admission: EM | Admit: 2010-10-24 | Discharge: 2010-10-24 | Disposition: A | Discharge status: Home / Self Care | Payer: Medicare Other | Attending: Emergency Medicine | Admitting: Emergency Medicine

## 2010-10-24 ENCOUNTER — Emergency Department (HOSPITAL_COMMUNITY): Payer: Medicare Other

## 2010-10-24 DIAGNOSIS — M549 Dorsalgia, unspecified: Secondary | ICD-10-CM | POA: Insufficient documentation

## 2010-10-24 DIAGNOSIS — M542 Cervicalgia: Secondary | ICD-10-CM | POA: Insufficient documentation

## 2010-10-24 DIAGNOSIS — Z853 Personal history of malignant neoplasm of breast: Secondary | ICD-10-CM | POA: Insufficient documentation

## 2010-10-24 DIAGNOSIS — F3289 Other specified depressive episodes: Secondary | ICD-10-CM | POA: Insufficient documentation

## 2010-10-24 DIAGNOSIS — F411 Generalized anxiety disorder: Secondary | ICD-10-CM | POA: Insufficient documentation

## 2010-10-24 DIAGNOSIS — IMO0002 Reserved for concepts with insufficient information to code with codable children: Secondary | ICD-10-CM | POA: Insufficient documentation

## 2010-10-24 DIAGNOSIS — F329 Major depressive disorder, single episode, unspecified: Secondary | ICD-10-CM | POA: Insufficient documentation

## 2010-10-24 DIAGNOSIS — Z79899 Other long term (current) drug therapy: Secondary | ICD-10-CM | POA: Insufficient documentation

## 2010-10-24 DIAGNOSIS — X58XXXA Exposure to other specified factors, initial encounter: Secondary | ICD-10-CM | POA: Insufficient documentation

## 2010-10-24 LAB — BASIC METABOLIC PANEL
BUN: 9 mg/dL (ref 6–23)
CO2: 24 mEq/L (ref 19–32)
Calcium: 9.8 mg/dL (ref 8.4–10.5)
Chloride: 102 mEq/L (ref 96–112)
Creatinine, Ser: 0.54 mg/dL (ref 0.50–1.10)
Glucose, Bld: 86 mg/dL (ref 70–99)

## 2010-10-24 LAB — CBC
MCH: 30.3 pg (ref 26.0–34.0)
MCHC: 33.9 g/dL (ref 30.0–36.0)
MCV: 89.3 fL (ref 78.0–100.0)
Platelets: 294 10*3/uL (ref 150–400)
RBC: 4.59 MIL/uL (ref 3.87–5.11)
RDW: 13.3 % (ref 11.5–15.5)

## 2010-10-24 LAB — CK TOTAL AND CKMB (NOT AT ARMC): Total CK: 56 U/L (ref 7–177)

## 2010-10-24 LAB — DIFFERENTIAL
Basophils Relative: 1 % (ref 0–1)
Eosinophils Absolute: 0.2 10*3/uL (ref 0.0–0.7)
Eosinophils Relative: 2 % (ref 0–5)
Lymphs Abs: 2.3 10*3/uL (ref 0.7–4.0)
Monocytes Relative: 8 % (ref 3–12)
Neutrophils Relative %: 57 % (ref 43–77)

## 2011-01-30 LAB — CBC
Hemoglobin: 13.5
MCHC: 35.4
MCV: 89.1
RBC: 4.29
WBC: 5.3

## 2011-01-30 LAB — COMPREHENSIVE METABOLIC PANEL
ALT: 16
AST: 17
CO2: 26
Calcium: 9.5
Chloride: 103
GFR calc non Af Amer: >60
Glucose, Bld: 96
Sodium: 137
Total Bilirubin: 0.5

## 2011-01-30 LAB — DIFFERENTIAL
Basophils Absolute: 0.1
Basophils Relative: 1
Eosinophils Absolute: 0.1
Eosinophils Relative: 2
Lymphs Abs: 1.7
Neutrophils Relative %: 57

## 2011-02-05 LAB — CBC
HCT: 39.8
Platelets: 336
RDW: 13.6
WBC: 6.4

## 2011-02-05 LAB — COMPREHENSIVE METABOLIC PANEL
AST: 17
Albumin: 4.5
Alkaline Phosphatase: 69
BUN: 11
Chloride: 107
GFR calc Af Amer: >60
Potassium: 3.9
Sodium: 140
Total Bilirubin: 0.5
Total Protein: 7.3

## 2011-02-05 LAB — DIFFERENTIAL
Basophils Absolute: 0
Basophils Relative: 1
Eosinophils Relative: 1
Monocytes Absolute: 0.6
Monocytes Relative: 9
Neutro Abs: 4

## 2011-02-05 LAB — CANCER ANTIGEN 27.29: CA 27.29: 11

## 2011-02-09 LAB — URINALYSIS, ROUTINE W REFLEX MICROSCOPIC
Bilirubin Urine: NEGATIVE
Hgb urine dipstick: NEGATIVE
Ketones, ur: NEGATIVE mg/dL
Specific Gravity, Urine: 1.01 (ref 1.005–1.030)
Urobilinogen, UA: 0.2 mg/dL (ref 0.0–1.0)
pH: 7 (ref 5.0–8.0)

## 2011-02-14 ENCOUNTER — Telehealth (HOSPITAL_COMMUNITY): Payer: Self-pay | Admitting: *Deleted

## 2011-02-15 LAB — COMPREHENSIVE METABOLIC PANEL
BUN: 7
CO2: 27
Calcium: 9.3
Chloride: 103
Creatinine, Ser: 0.61
GFR calc non Af Amer: >60
Total Bilirubin: 0.5

## 2011-02-15 LAB — CBC
HCT: 39.2
MCHC: 33.9
MCV: 90.8
RBC: 4.32
WBC: 4.4

## 2011-02-15 LAB — DIFFERENTIAL
Basophils Absolute: 0
Lymphocytes Relative: 19
Neutro Abs: 3.1
Neutrophils Relative %: 70

## 2011-02-15 LAB — T4, FREE: Free T4: 1.07

## 2011-02-15 LAB — TSH: TSH: 1.751

## 2011-02-16 ENCOUNTER — Telehealth (HOSPITAL_COMMUNITY): Payer: Self-pay

## 2011-02-16 NOTE — Telephone Encounter (Signed)
Message left regarding patient request for Nicotine patches stage 3 and Prilosec 20 mg 1 po daily called to [facility] pharmacy in [place] per request of [employer], PA-C.

## 2011-02-22 LAB — DIFFERENTIAL
Basophils Absolute: 0
Eosinophils Absolute: 0.1
Eosinophils Relative: 1
Lymphocytes Relative: 14
Monocytes Absolute: 0.9 — ABNORMAL HIGH
Neutro Abs: 7.6
Neutrophils Relative %: 76

## 2011-02-22 LAB — CBC
MCHC: 34.4
RBC: 3.61 — ABNORMAL LOW
WBC: 9.9

## 2011-03-27 ENCOUNTER — Encounter (HOSPITAL_COMMUNITY): Payer: Medicare Other | Attending: Oncology | Admitting: Oncology

## 2011-03-27 ENCOUNTER — Encounter (HOSPITAL_COMMUNITY): Payer: Self-pay | Admitting: Oncology

## 2011-03-27 VITALS — BP 126/73 | HR 73 | Temp 97.7°F | Ht 64.76 in | Wt 134.2 lb

## 2011-03-27 DIAGNOSIS — J449 Chronic obstructive pulmonary disease, unspecified: Secondary | ICD-10-CM

## 2011-03-27 DIAGNOSIS — C50919 Malignant neoplasm of unspecified site of unspecified female breast: Secondary | ICD-10-CM

## 2011-03-27 NOTE — Patient Instructions (Addendum)
[name]  [id number] [birth year]   [facility]  Discharge Instructions  RECOMMENDATIONS MADE BY THE CONSULTANT AND ANY TEST RESULTS WILL BE SENT TO YOUR REFERRING DOCTOR.   EXAM FINDINGS BY MD TODAY AND SIGNS AND SYMPTOMS TO REPORT TO CLINIC OR PRIMARY MD: Per Dr. Mariel Sleet, no abnormalities were found on your breast exam.  INSTRUCTIONS GIVEN AND DISCUSSED: Return in the spring to see Dr. Mariel Sleet and have your labs drawn same day.  I acknowledge that I have been informed and understand all the instructions given to me and received a copy. I do not have any more questions at this time, but understand that I may call the Specialty Clinic at [facility] at [phone] during business hours should I have any further questions or need assistance in obtaining follow-up care.    __________________________________________  _____________  __________ Signature of Patient or Authorized Representative            Date                   Time    __________________________________________ Nurse's Signature  Discharge teaching was previously discussed with patient by Randall An, MD.

## 2011-03-27 NOTE — Progress Notes (Signed)
This office note has been dictated.

## 2011-03-27 NOTE — Progress Notes (Signed)
CC:   Billie Lade, Ph.D., M.D. [facility] Dalia Heading, M.D. Concepcion Living, MD  DIAGNOSES: 1. Stage II (T1c N1 M0), grade 3, triple negative breast cancer on the     right with 1/5 positive nodes.  She is, again, status post FEC     every 2 weeks in a dose-dense fashion for 6 cycles and then     radiation therapy by Dr. Roselind Messier for her high-risk disease.  Her     date of surgery was in February 2008. 2. Chronic obstructive pulmonary disease, still smoking on-half a pack     a day. 3. Anxiety and depression over multiple social issues. 4. Bilateral breast reconstruction by Dr. Dawna Part at [facility] with good     results. 5. Multinodular thyroid gland, most likely a goiter.  INTERVAL HISTORY:  [name] is here today for followup.  She is living with her 48 year old daughter, who is a drug addict.  She states she is also bipolar and is really giving Megan a fit presently.  She is just not taking care of herself and leaves a lot of stuff to be done by [place].  She herself today thought she found a lump underneath her right breast a month ago and brought it to my attention.  She has nothing else positive on review of systems.  PHYSICAL EXAMINATION:  Vital Signs:  She is still about 134 pounds. Height 5 feet and 4-3/4 inches.  BMI 22.5.  Blood pressure 126/73 on left arm in sitting position.  Pulse 72 and regular.  Respirations 16-18 and unlabored.  She is afebrile and not in any pain.  Lymphs:  She has no adenopathy in any location.  Lungs:  Actually clear to auscultation. Heart:  Regular rhythm and rate without murmur, rub, or gallop.  Breast Exam:  Both reconstructed breasts are negative and what she pointed out to me is really just part of the implant.  It is a nice little ridge that goes all along the inferior and lateral aspects of the right reconstructed breast and easily felt to be the implant.  There is no nodularity to the breast.  Abdomen:  Soft and  nontender without organomegaly.  Bowel sounds are normal.  Extremities:  She has no peripheral edema.  ASSESSMENT AND PLAN:  I think she is doing really quite well.  I do not think I need to do blood work today.  I will see her in 6 months and we will do it then.    ______________________________ Ladona Horns. Mariel Sleet, MD ESN/MEDQ  D:  03/27/2011  T:  03/27/2011  Job:  [id number]

## 2011-04-26 ENCOUNTER — Other Ambulatory Visit (HOSPITAL_COMMUNITY): Payer: Self-pay | Admitting: Oncology

## 2011-07-04 ENCOUNTER — Other Ambulatory Visit (HOSPITAL_COMMUNITY): Payer: Self-pay | Admitting: Oncology

## 2011-09-24 ENCOUNTER — Encounter (HOSPITAL_BASED_OUTPATIENT_CLINIC_OR_DEPARTMENT_OTHER): Payer: Medicare Other | Admitting: Oncology

## 2011-09-24 ENCOUNTER — Encounter (HOSPITAL_COMMUNITY): Payer: Medicare Other | Attending: Oncology

## 2011-09-24 VITALS — BP 94/66 | HR 88 | Temp 97.6°F | Wt 129.2 lb

## 2011-09-24 DIAGNOSIS — F17200 Nicotine dependence, unspecified, uncomplicated: Secondary | ICD-10-CM | POA: Insufficient documentation

## 2011-09-24 DIAGNOSIS — Z09 Encounter for follow-up examination after completed treatment for conditions other than malignant neoplasm: Secondary | ICD-10-CM | POA: Insufficient documentation

## 2011-09-24 DIAGNOSIS — C50919 Malignant neoplasm of unspecified site of unspecified female breast: Secondary | ICD-10-CM

## 2011-09-24 DIAGNOSIS — F341 Dysthymic disorder: Secondary | ICD-10-CM | POA: Insufficient documentation

## 2011-09-24 DIAGNOSIS — Z853 Personal history of malignant neoplasm of breast: Secondary | ICD-10-CM | POA: Insufficient documentation

## 2011-09-24 DIAGNOSIS — R209 Unspecified disturbances of skin sensation: Secondary | ICD-10-CM | POA: Insufficient documentation

## 2011-09-24 DIAGNOSIS — J438 Other emphysema: Secondary | ICD-10-CM | POA: Insufficient documentation

## 2011-09-24 DIAGNOSIS — F411 Generalized anxiety disorder: Secondary | ICD-10-CM

## 2011-09-24 DIAGNOSIS — K219 Gastro-esophageal reflux disease without esophagitis: Secondary | ICD-10-CM

## 2011-09-24 DIAGNOSIS — J449 Chronic obstructive pulmonary disease, unspecified: Secondary | ICD-10-CM

## 2011-09-24 DIAGNOSIS — E042 Nontoxic multinodular goiter: Secondary | ICD-10-CM | POA: Insufficient documentation

## 2011-09-24 LAB — CBC
HCT: 44.9 % (ref 36.0–46.0)
MCHC: 34.1 g/dL (ref 30.0–36.0)
MCV: 90 fL (ref 78.0–100.0)
Platelets: 294 10*3/uL (ref 150–400)
RDW: 13.2 % (ref 11.5–15.5)
WBC: 6.1 10*3/uL (ref 4.0–10.5)

## 2011-09-24 LAB — DIFFERENTIAL
Basophils Relative: 1 % (ref 0–1)
Lymphocytes Relative: 49 % — ABNORMAL HIGH (ref 12–46)
Lymphs Abs: 3 10*3/uL (ref 0.7–4.0)
Monocytes Absolute: 0.4 10*3/uL (ref 0.1–1.0)
Monocytes Relative: 6 % (ref 3–12)
Neutro Abs: 2.5 10*3/uL (ref 1.7–7.7)
Neutrophils Relative %: 41 % — ABNORMAL LOW (ref 43–77)

## 2011-09-24 LAB — COMPREHENSIVE METABOLIC PANEL
AST: 14 U/L (ref 0–37)
Albumin: 4.2 g/dL (ref 3.5–5.2)
BUN: 7 mg/dL (ref 6–23)
Chloride: 102 mEq/L (ref 96–112)
Creatinine, Ser: 0.61 mg/dL (ref 0.50–1.10)
Total Bilirubin: 0.4 mg/dL (ref 0.3–1.2)
Total Protein: 7.8 g/dL (ref 6.0–8.3)

## 2011-09-24 MED ORDER — OMEPRAZOLE 20 MG PO CPDR: 20.0000 mg | DELAYED_RELEASE_CAPSULE | Freq: Every day | ORAL | Status: DC

## 2011-09-24 NOTE — Progress Notes (Signed)
Labs drawn today for cbc/diff,cmp 

## 2011-09-24 NOTE — Progress Notes (Signed)
CC:   Earle Gell, M.D. Billie Lade, Ph.D., M.D. Dalia Heading, M.D.  DIAGNOSES: 1. Stage II (T1c N1 M0) grade 3 triple negative breast cancer, right-     sided with 1 of 5 positive nodes, status post FEC in a dose dense     fashion for 6 cycles followed by radiation therapy by Dr. Roselind Messier     for her high-risk disease with her initial date of surgery in     February 2008. 2. 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  She     started smoking when she was 34.  She has smoked as much as a pack     a day. 3. Anxiety and depression over multiple social issues, and her     daughter is still living with her. 4. Bilateral breast reconstruction by Dr. Dawna Part at [facility] with good     results though she is concerned that the right breast is starting     to become ptotic.  She will try to make an appoint with him to go     over things, but I do not think she needs surgery at this time, but     she wants an opinion. 5. Multinodular thyroid gland, most likely a goiter.  Reniyah is still having numbness in her 3rd, 4th and 5th fingers on both hands, about the same.  She did have an ulnar nerve release several years ago by Dr. Teressa Senter she thinks, but he could not do more on that right side.  He has not operated on the left and does not want to.  She also has numbness in several of her toes, but she is not specific which toes they were.  She is still smoking.  Her pulses in her feet, I think, are mildly diminished without question, and her lungs show markedly diminished breath sounds and hyperresonance to percussion now.  The rest of her exam shows both reconstructed breasts to be negative.  She has no masses, no obvious skin changes, perhaps mild ptosis on the right compared to the left.  Heart shows a regular rhythm and rate.  Abdomen is soft, nontender, without organomegaly.  She has no peripheral edema.  She  really does need to quit smoking.  I went over her chest x-ray with her that she had last year and that clearly shows some early emphysematous changes.  Her breathing evaluation even with the deepest of breath she cannot have tremendous breath sounds and a lot of air movement.  She has hyperresonance to percussion as well without question.  She has no adenopathy.  No skin lesions as I mentioned, no obvious recurrence of disease.  Her labs from today do show her hemoglobin is now high. and I suspect that is related to her COPD.  She will see me in a year now from now on.  I have asked her once again to quit smoking somehow and someway, to get more exercise to see if she can build up her breathing capacity since she does not have a great deal of reserve.  She can walk probably about a quarter of a mile, but gets short of breath even doing that.  She has a lot of stress at home.  She is living with her daughter who is bipolar, had been on drugs, etc.  Cipriana's own review of systems really is otherwise negative oncologically.  ______________________________ Ladona Horns. Mariel Sleet, MD ESN/MEDQ  D:  09/24/2011  T:  09/24/2011  Job:  [id number]

## 2011-09-24 NOTE — Patient Instructions (Signed)
[name]  [id number] 1960-06-10 Dr. Glenford Peers   [facility]  Discharge Instructions  RECOMMENDATIONS MADE BY THE CONSULTANT AND ANY TEST RESULTS WILL BE SENT TO YOUR REFERRING DOCTOR.   EXAM FINDINGS BY MD TODAY AND SIGNS AND SYMPTOMS TO REPORT TO CLINIC OR PRIMARY MD: Exam and discussion per MD.  MEDICATIONS PRESCRIBED: none   INSTRUCTIONS GIVEN AND DISCUSSED: Other :  Need to stop smoking.  Report any new lumps, bone pain or shortness of breath.  SPECIAL INSTRUCTIONS/FOLLOW-UP: Return to Clinic in 1 year.   I acknowledge that I have been informed and understand all the instructions given to me and received a copy. I do not have any more questions at this time, but understand that I may call the Specialty Clinic at [facility] at [phone] during business hours should I have any further questions or need assistance in obtaining follow-up care.    __________________________________________  _____________  __________ Signature of Patient or Authorized Representative            Date                   Time    __________________________________________ Nurse's Signature

## 2011-09-24 NOTE — Progress Notes (Signed)
This office note has been dictated.

## 2012-04-21 ENCOUNTER — Encounter (HOSPITAL_COMMUNITY): Payer: Medicare Other | Attending: Oncology | Admitting: Oncology

## 2012-04-21 ENCOUNTER — Encounter (HOSPITAL_COMMUNITY): Payer: Self-pay | Admitting: Oncology

## 2012-04-21 VITALS — BP 112/59 | HR 70 | Temp 97.4°F | Resp 16 | Wt 147.4 lb

## 2012-04-21 DIAGNOSIS — C50919 Malignant neoplasm of unspecified site of unspecified female breast: Secondary | ICD-10-CM

## 2012-04-21 DIAGNOSIS — N644 Mastodynia: Secondary | ICD-10-CM

## 2012-04-21 HISTORY — DX: Malignant neoplasm of unspecified site of unspecified female breast: C50.919

## 2012-04-21 NOTE — Progress Notes (Signed)
Ranell is here as a work-in today.  She called complaining of left breast pain.  She reports that it began 1 week ago.  She denies any radiation of the pain.  She reports that it is burning in sensation.    BP 112/59  Pulse 70  Temp 97.4 F (36.3 C) (Oral)  Resp 16  Wt 147 lb 6.4 oz (66.86 kg) Gen: NAD, pleasant HEENT: Atraumatic, normocephalic Cardiac: RRR Lungs: CTA B/L with severely diminished breath sounds.  Breast: B/L Breast implants without any abnormality appreciated on exam.  No masses, lesion, skin changes, or nodules appreciated.  Skin: Warm and dry Lymph: No adenopathy noted  Extremities: No erythema, heat, pain, or edema. Neuro: A and O x 3.  No focal deficits  Assessment:  1. Left breast pain. 2. Stage II (T1c N1 M0) grade 3 triple negative breast cancer, right- sided with 1 of 5 positive nodes, status post FEC in a dose dense fashion for 6 cycles followed by radiation therapy by Dr. Roselind Messier for her high-risk disease with her initial date of surgery in February 2008.   Plan: 1. CT of chest with contrast to evaluate for chest wall recurrence.   2. Chart reviewed 3. Return as scheduled for follow-up.  Will contact the patient after CT with results and further recommendations as necessary.   All questions were answered.   Patient and plan discussed with Dr. Mariel Sleet and he is in agreement with the aforementioned.   Elsi Stelzer

## 2012-04-21 NOTE — Patient Instructions (Addendum)
[facility] Discharge Instructions  RECOMMENDATIONS MADE BY THE CONSULTANT AND ANY TEST RESULTS WILL BE SENT TO YOUR REFERRING PHYSICIAN.  Exam per Jenita Seashore, PA today. We will get you scheduled for a CT scan within 1-2 weeks. Keep other appointments as already scheduled. Report any issues/concerns to clinic as needed.   Thank you for choosing [facility] to provide your oncology and hematology care.  To afford each patient quality time with our providers, please arrive at least 15 minutes before your scheduled appointment time.  With your help, our goal is to use those 15 minutes to complete the necessary work-up to ensure our physicians have the information they need to help with your evaluation and healthcare recommendations.    Effective January 1st, 2014, we ask that you re-schedule your appointment with our physicians should you arrive 10 or more minutes late for your appointment.  We strive to give you quality time with our providers, and arriving late affects you and other patients whose appointments are after yours.    Again, thank you for choosing [facility].  Our hope is that these requests will decrease the amount of time that you wait before being seen by our physicians.       _____________________________________________________________  I acknowledge that I have been informed and understand all the instructions given to me and received a copy. I do not have anymore questions at this time but understand that I may call the Cancer Center at [facility] at [phone] during business hours should I have any further questions or need assistance in obtaining follow-up care.    __________________________________________  _____________  __________ Signature of Patient or Authorized Representative            Date                   Time    __________________________________________ Nurse's Signature

## 2012-04-25 ENCOUNTER — Ambulatory Visit (HOSPITAL_COMMUNITY)
Admission: RE | Admit: 2012-04-25 | Discharge: 2012-04-25 | Disposition: A | Discharge status: Home / Self Care | Payer: Medicare Other | Source: Ambulatory Visit | Attending: Oncology | Admitting: Oncology

## 2012-04-25 DIAGNOSIS — Z853 Personal history of malignant neoplasm of breast: Secondary | ICD-10-CM | POA: Insufficient documentation

## 2012-04-25 DIAGNOSIS — N644 Mastodynia: Secondary | ICD-10-CM | POA: Insufficient documentation

## 2012-04-25 DIAGNOSIS — C50919 Malignant neoplasm of unspecified site of unspecified female breast: Secondary | ICD-10-CM

## 2012-04-25 MED ORDER — IOHEXOL 300 MG/ML  SOLN
80.0000 mL | Freq: Once | INTRAMUSCULAR | Status: AC | PRN
  Administered 2012-04-25: 80 mL via INTRAVENOUS

## 2012-09-22 ENCOUNTER — Ambulatory Visit (HOSPITAL_COMMUNITY): Payer: Medicare Other

## 2012-10-08 NOTE — Progress Notes (Signed)
-  No show, letter sent-   

## 2012-10-09 ENCOUNTER — Ambulatory Visit (HOSPITAL_COMMUNITY): Payer: Medicare Other | Admitting: Oncology

## 2012-12-18 ENCOUNTER — Encounter (HOSPITAL_COMMUNITY): Payer: Medicare Other | Attending: Oncology | Admitting: Oncology

## 2012-12-18 ENCOUNTER — Encounter (HOSPITAL_COMMUNITY): Payer: Self-pay | Admitting: Oncology

## 2012-12-18 VITALS — BP 110/76 | HR 73 | Temp 98.1°F | Resp 18 | Wt 157.0 lb

## 2012-12-18 DIAGNOSIS — R058 Other specified cough: Secondary | ICD-10-CM

## 2012-12-18 DIAGNOSIS — R05 Cough: Secondary | ICD-10-CM

## 2012-12-18 DIAGNOSIS — C50919 Malignant neoplasm of unspecified site of unspecified female breast: Secondary | ICD-10-CM

## 2012-12-18 DIAGNOSIS — C50911 Malignant neoplasm of unspecified site of right female breast: Secondary | ICD-10-CM

## 2012-12-18 DIAGNOSIS — Z720 Tobacco use: Secondary | ICD-10-CM | POA: Insufficient documentation

## 2012-12-18 DIAGNOSIS — K219 Gastro-esophageal reflux disease without esophagitis: Secondary | ICD-10-CM | POA: Insufficient documentation

## 2012-12-18 DIAGNOSIS — F17200 Nicotine dependence, unspecified, uncomplicated: Secondary | ICD-10-CM

## 2012-12-18 DIAGNOSIS — R059 Cough, unspecified: Secondary | ICD-10-CM

## 2012-12-18 HISTORY — DX: Gastro-esophageal reflux disease without esophagitis: K21.9

## 2012-12-18 MED ORDER — AZITHROMYCIN 250 MG PO TABS: ORAL_TABLET | ORAL | Status: AC

## 2012-12-18 MED ORDER — OMEPRAZOLE 20 MG PO CPDR: 20.0000 mg | DELAYED_RELEASE_CAPSULE | Freq: Every day | ORAL | Status: DC

## 2012-12-18 NOTE — Patient Instructions (Addendum)
[facility] Discharge Instructions  RECOMMENDATIONS MADE BY THE CONSULTANT AND ANY TEST RESULTS WILL BE SENT TO YOUR REFERRING PHYSICIAN.  MEDICATIONS PRESCRIBED:  Azithromycin Recommend Delsym Cough Suppressant for cough  INSTRUCTIONS GIVEN AND DISCUSSED: None  SPECIAL INSTRUCTIONS/FOLLOW-UP: Return in 1 year for follow-up. Continue follow-up with PCP  Thank you for choosing [facility] to provide your oncology and hematology care.  To afford each patient quality time with our providers, please arrive at least 15 minutes before your scheduled appointment time.  With your help, our goal is to use those 15 minutes to complete the necessary work-up to ensure our physicians have the information they need to help with your evaluation and healthcare recommendations.    Effective January 1st, 2014, we ask that you re-schedule your appointment with our physicians should you arrive 10 or more minutes late for your appointment.  We strive to give you quality time with our providers, and arriving late affects you and other patients whose appointments are after yours.    Again, thank you for choosing [facility].  Our hope is that these requests will decrease the amount of time that you wait before being seen by our physicians.       _____________________________________________________________  Should you have questions after your visit to [facility], please contact our office at [phone] between the hours of 8:30 a.m. and 5:00 p.m.  Voicemails left after 4:30 p.m. will not be returned until the following business day.  For prescription refill requests, have your pharmacy contact our office with your prescription refill request.

## 2012-12-18 NOTE — Progress Notes (Signed)
No primary provider on file. No primary provider on file.  Breast cancer, right  Acid reflux - Plan: omeprazole (PRILOSEC) 20 MG capsule, azithromycin (ZITHROMAX) 250 MG tablet  Productive cough - Plan: omeprazole (PRILOSEC) 20 MG capsule, azithromycin (ZITHROMAX) 250 MG tablet  Tobacco abuse - Plan: omeprazole (PRILOSEC) 20 MG capsule, azithromycin (ZITHROMAX) 250 MG tablet  CURRENT THERAPY: Observation  INTERVAL HISTORY: [name] 52 y.o. female returns for  regular  visit for followup of  Stage II (T1c N1 M0) grade 3 triple negative breast cancer, right- sided with 1 of 5 positive nodes, status post FEC in a dose dense fashion for 6 cycles followed by radiation therapy by Dr. Roselind Messier for her high-risk disease with her initial date of surgery in February 2008.  From an oncology standpoint, she denies any complaints and ROS questioning is negative.   With this being said, she reports that about 1 month ago she developed a productive cough of yellow and green sputum.  She treated it symptomatically with OTC medications.  It has improved but she continues with a cough a and sputum production.  The sputum is brown in color. She denies any fevers or shaking chills.  I will treat with antibiotic for bronchitis/URI and I have recommend Delsym OTC for cough suppression.  Unfortunately, last year, her daughter passed at home.  She likely had heart failure secondary to drug abuse.  This of course was a traumatic time for the patient and she is coping with this loss well to date she reports.   She continues to smoke 1/2-1 ppd.  Smoking cessation education was provided today.  She hopes to quit within the year.    She asks for a refill on her Omeprazole and I have give her a 1 year supply of this medication.   Past Medical History  Diagnosis Date  . Breast cancer   . Neuropathy   . Osteoporosis   . Depression   . Breast cancer 04/21/2012    Stage II (T1c N1 M0) grade 3 triple  negative breast cancer, right- sided with 1 of 5 positive nodes, status post FEC in a dose dense fashion for 6 cycles followed by radiation therapy by Dr. Roselind Messier for her high-risk disease with her initial date of surgery in February 2008.     has Breast cancer and Tobacco abuse on her problem list.     has No Known Allergies.  Ms. Scogin had no medications administered during this visit.  Past Surgical History  Procedure Laterality Date  . Tubal ligation    . Mastectomy      bilateral    Denies any headaches, dizziness, double vision, fevers, chills, night sweats, nausea, vomiting, diarrhea, constipation, chest pain, heart palpitations, shortness of breath, blood in stool, black tarry stool, urinary pain, urinary burning, urinary frequency, hematuria.   PHYSICAL EXAMINATION  ECOG PERFORMANCE STATUS: 0 - Asymptomatic  Filed Vitals:   12/18/12 1300  BP: 110/76  Pulse: 73  Temp: 98.1 F (36.7 C)  Resp: 18    GENERAL:alert, no distress, well nourished, well developed, comfortable, cooperative and smiling SKIN: skin color, texture, turgor are normal, no rashes or significant lesions HEAD: Normocephalic, No masses, lesions, tenderness or abnormalities EYES: normal, PERRLA, EOMI, Conjunctiva are pink and non-injected EARS: External ears normal OROPHARYNX:mucous membranes are moist  NECK: supple, no adenopathy, thyroid normal size, non-tender, without nodularity, no stridor, non-tender, trachea midline LYMPH:  no palpable lymphadenopathy, no hepatosplenomegaly BREAST:bilateral implants, no palpable  abnormalities otherwise LUNGS: positive findings: rhonchi  right mid posterior, right lower posterior and left lower posterior, decreased breath sounds HEART: regular rate & rhythm, no murmurs, no gallops, S1 normal and S2 normal ABDOMEN:abdomen soft, non-tender, normal bowel sounds, no masses or organomegaly and no hepatosplenomegaly BACK: Back symmetric, no curvature., No CVA  tenderness EXTREMITIES:less then 2 second capillary refill, no joint deformities, effusion, or inflammation, no edema, no skin discoloration, no clubbing, no cyanosis  NEURO: alert & oriented x 3 with fluent speech, no focal motor/sensory deficits, gait normal    ASSESSMENT:  1. Stage II (T1c N1 M0) grade 3 triple negative breast cancer, right- sided with 1 of 5 positive nodes, status post FEC in a dose dense fashion for 6 cycles followed by radiation therapy by Dr. Roselind Messier for her high-risk disease with her initial date of surgery in February 2008. 2. Productive cough, bronchitis versus URI 3. GERD 4. Tobacco abuse, still smoking 1/2-1 ppd  Patient Active Problem List   Diagnosis Date Noted  . Tobacco abuse 12/18/2012  . Breast cancer 04/21/2012     PLAN:  1. Smoking cessation education provided 2. Patient education regarding self-breast exams 3. Rx for Omeprazole 20 mg daily x 1 year supply 4. Rx for Z-Pak 5. Recommended OTC Delsym 6. Return in 1 year for follow-up   THERAPY PLAN:  Oncologically, the patient is doing well.  We will see her back in 1 year for follow-up, sooner if needed.   All questions were answered. The patient knows to call the clinic with any problems, questions or concerns. We can certainly see the patient much sooner if necessary.  Patient and plan discussed with Dr. Benita Gutter and he is in agreement with the aforementioned.   KEFALAS,THOMAS

## 2013-03-04 ENCOUNTER — Other Ambulatory Visit (HOSPITAL_COMMUNITY): Payer: Self-pay | Admitting: Family Medicine

## 2013-03-10 ENCOUNTER — Telehealth: Payer: Self-pay

## 2013-03-10 NOTE — Telephone Encounter (Signed)
Pt was referred for colonoscopy. Last one was 03/10/2008 by Dr. Darrick Penna and next one due 04/2018. Pt is having some diarrhea and OV is scheduled for 03/25/2013 at 3:00 PM.

## 2013-03-11 ENCOUNTER — Ambulatory Visit (HOSPITAL_COMMUNITY)
Admission: RE | Admit: 2013-03-11 | Discharge: 2013-03-11 | Disposition: A | Discharge status: Home / Self Care | Payer: Medicare Other | Source: Ambulatory Visit | Attending: Family Medicine | Admitting: Family Medicine

## 2013-03-25 ENCOUNTER — Ambulatory Visit: Payer: Medicare Other | Admitting: Gastroenterology

## 2013-04-06 ENCOUNTER — Telehealth: Payer: Self-pay | Admitting: Gastroenterology

## 2013-04-06 ENCOUNTER — Ambulatory Visit: Payer: Medicare Other | Admitting: Gastroenterology

## 2013-04-06 NOTE — Telephone Encounter (Signed)
Please send note

## 2013-04-06 NOTE — Telephone Encounter (Signed)
Pt was a no show

## 2013-07-01 ENCOUNTER — Encounter (HOSPITAL_COMMUNITY): Payer: Self-pay | Admitting: Oncology

## 2013-07-01 ENCOUNTER — Ambulatory Visit (HOSPITAL_COMMUNITY)
Admission: RE | Admit: 2013-07-01 | Discharge: 2013-07-01 | Disposition: A | Discharge status: Home / Self Care | Payer: Medicare Other | Source: Ambulatory Visit | Attending: Oncology | Admitting: Oncology

## 2013-07-01 ENCOUNTER — Encounter (HOSPITAL_COMMUNITY): Payer: Medicare Other | Attending: Oncology | Admitting: Oncology

## 2013-07-01 VITALS — BP 111/71 | HR 114 | Temp 97.8°F | Resp 18 | Wt 174.0 lb

## 2013-07-01 DIAGNOSIS — M25512 Pain in left shoulder: Secondary | ICD-10-CM

## 2013-07-01 DIAGNOSIS — M79609 Pain in unspecified limb: Secondary | ICD-10-CM | POA: Insufficient documentation

## 2013-07-01 DIAGNOSIS — C50519 Malignant neoplasm of lower-outer quadrant of unspecified female breast: Secondary | ICD-10-CM

## 2013-07-01 DIAGNOSIS — M79602 Pain in left arm: Secondary | ICD-10-CM

## 2013-07-01 DIAGNOSIS — C50919 Malignant neoplasm of unspecified site of unspecified female breast: Secondary | ICD-10-CM

## 2013-07-01 DIAGNOSIS — M25519 Pain in unspecified shoulder: Secondary | ICD-10-CM

## 2013-07-01 NOTE — Progress Notes (Signed)
Patient is seen as a work in today.  She reports since the fall of 2014, she's been having significant issues with her left "shoulder." She reports that her primary care physician has been working on this issue. She's had x-rays of the left shoulder she reports. She's also undergone a "steroid injection" in the left shoulder. This was ineffective.  She is seen holding her left forearm across her chest. She denies any trauma. She explains that the discomfort is actually in the humeral area of the left arm and not so much in the left shoulder. On examination, she has significantly decreased range of motion. She is able to extend and abduct the left shoulder to about 90. His motion causes left humeral oral and left superior shoulder discomfort near the acromion. Deep palpation left humeral area is uncomfortable. Her left arm is not significantly edematous compared to her right.  Mild erythema is noted the left humeral area. No increased heat appreciated. She is unable to externally rotate her left shoulder. She is unable to put her arm behind her back.  No adenopathy noted in the left axilla.  She reports her primary care physician has recommended she follow up with [place] since he has no other opinions or options. This does not seem to be an oncologic issue, but I will work this up.  I will order the following: 1. Ultrasound of left upper extremity, focus on humeral area.  This will be done to rule out DVT. 2. Left humeral x-ray 3. MRI of left shoulder with and without contrast with extended imaging of left axilla.  4. She will return in approximately 2 weeks following MRI to review results. She may need a physical therapy referral for evaluation and treatment if MRI is negative. She maintained required orthopedic referral as well.  Patient and plan discussed with Dr. Farrel Gobble and he is in agreement with the aforementioned.   [name]  Addendum:  CLINICAL DATA: Pain. Breast cancer.   EXAM:  LEFT HUMERUS - 2+ VIEW  COMPARISON: Left shoulder series 06/29/2013.  FINDINGS:  Degenerative changes noted of the left shoulder. No acute bony  abnormality identified. Specifically no focal abnormality noted of  the humerus.  IMPRESSION:  No acute abnormality.  Electronically Signed  By: Marcello Moores Register  On: 07/01/2013 16:20  CLINICAL DATA: Left arm pain  EXAM:  LEFT UPPER EXTREMITY VENOUS DUPLEX ULTRASOUND  TECHNIQUE:  Doppler venous assessment of the left upper extremity deep venous  system was performed, including characterization of spectral flow,  compressibility, and phasicity.  COMPARISON: None.  FINDINGS:  There is complete compressibility of the left internal jugular, left  subclavian, left axillary, left brachial, left basilic, left  cephalic veins. There is compressibility of the radial and ulnar  veins in the forearm. Doppler analysis demonstrates a  normal-appearing venous waveform.  IMPRESSION:  No evidence of left upper extremity DVT.  Electronically Signed  By: Maryclare Bean M.D.  On: 07/01/2013 16:15   Both studies are negative.  Will pursue MRI left shoulder.  I discussed the case with Dr. Zigmund Daniel (Radiologist) as I have requested left axilla imaging with the MRI.  This is noted in the order for MRI.   Duane Earnshaw

## 2013-07-01 NOTE — Patient Instructions (Signed)
[facility] Discharge Instructions  RECOMMENDATIONS MADE BY THE CONSULTANT AND ANY TEST RESULTS WILL BE SENT TO YOUR REFERRING PHYSICIAN.  EXAM FINDINGS BY THE PHYSICIAN TODAY AND SIGNS OR SYMPTOMS TO REPORT TO CLINIC OR PRIMARY PHYSICIAN: Exam and findings as discussed by Robynn Pane, PA-C.  Will do an ultrasound and xrays of your left upper arm.  If they are negative we will get a MRI of your left shoulder.  MEDICATIONS PRESCRIBED:  none  INSTRUCTIONS/FOLLOW-UP: 2 weeks.  Thank you for choosing [facility] to provide your oncology and hematology care.  To afford each patient quality time with our providers, please arrive at least 15 minutes before your scheduled appointment time.  With your help, our goal is to use those 15 minutes to complete the necessary work-up to ensure our physicians have the information they need to help with your evaluation and healthcare recommendations.    Effective January 1st, 2014, we ask that you re-schedule your appointment with our physicians should you arrive 10 or more minutes late for your appointment.  We strive to give you quality time with our providers, and arriving late affects you and other patients whose appointments are after yours.    Again, thank you for choosing [facility].  Our hope is that these requests will decrease the amount of time that you wait before being seen by our physicians.       _____________________________________________________________  Should you have questions after your visit to [facility], please contact our office at [phone] between the hours of 8:30 a.m. and 5:00 p.m.  Voicemails left after 4:30 p.m. will not be returned until the following business day.  For prescription refill requests, have your pharmacy contact our office with your prescription refill request.

## 2013-07-07 ENCOUNTER — Ambulatory Visit (HOSPITAL_COMMUNITY): Admission: RE | Admit: 2013-07-07 | Payer: Medicare Other | Source: Ambulatory Visit

## 2013-07-09 ENCOUNTER — Ambulatory Visit (HOSPITAL_COMMUNITY)
Admission: RE | Admit: 2013-07-09 | Discharge: 2013-07-09 | Disposition: A | Discharge status: Home / Self Care | Payer: Medicare Other | Source: Ambulatory Visit | Attending: Oncology | Admitting: Oncology

## 2013-07-09 DIAGNOSIS — M25512 Pain in left shoulder: Secondary | ICD-10-CM

## 2013-07-09 DIAGNOSIS — M259 Joint disorder, unspecified: Secondary | ICD-10-CM | POA: Diagnosis not present

## 2013-07-09 DIAGNOSIS — M719 Bursopathy, unspecified: Principal | ICD-10-CM | POA: Insufficient documentation

## 2013-07-09 DIAGNOSIS — M79602 Pain in left arm: Secondary | ICD-10-CM

## 2013-07-09 DIAGNOSIS — M25519 Pain in unspecified shoulder: Secondary | ICD-10-CM | POA: Diagnosis present

## 2013-07-09 DIAGNOSIS — Z853 Personal history of malignant neoplasm of breast: Secondary | ICD-10-CM | POA: Insufficient documentation

## 2013-07-09 DIAGNOSIS — M67919 Unspecified disorder of synovium and tendon, unspecified shoulder: Secondary | ICD-10-CM | POA: Diagnosis not present

## 2013-07-09 DIAGNOSIS — C50919 Malignant neoplasm of unspecified site of unspecified female breast: Secondary | ICD-10-CM

## 2013-07-09 MED ORDER — GADOBENATE DIMEGLUMINE 529 MG/ML IV SOLN
15.0000 mL | Freq: Once | INTRAVENOUS | Status: AC | PRN
  Administered 2013-07-09: 15 mL via INTRAVENOUS

## 2013-07-15 ENCOUNTER — Encounter: Payer: Self-pay | Admitting: Orthopedic Surgery

## 2013-07-15 ENCOUNTER — Ambulatory Visit (INDEPENDENT_AMBULATORY_CARE_PROVIDER_SITE_OTHER): Payer: Medicare Other | Admitting: Orthopedic Surgery

## 2013-07-15 VITALS — BP 124/74 | Ht 64.0 in | Wt 171.0 lb

## 2013-07-15 DIAGNOSIS — M7500 Adhesive capsulitis of unspecified shoulder: Secondary | ICD-10-CM

## 2013-07-15 MED ORDER — ACETAMINOPHEN-CODEINE #3 300-30 MG PO TABS: 1.0000 | ORAL_TABLET | ORAL | Status: DC | PRN

## 2013-07-15 NOTE — Patient Instructions (Signed)
Call [name] to arrange therapy

## 2013-07-15 NOTE — Progress Notes (Signed)
Patient ID: [name], female   DOB: March 01, 1961, 53 y.o.   MRN: [id number]  Chief Complaint  Patient presents with  . Shoulder Pain    Left shoulder pain, no injury. Referred by Dr. Sheldon Silvan for eval and treat.   HISTORY: Pain left arm with no trauma sudden onset started October received one intramuscular shot of steroids in the left shoulder still complains of 10 out of 10 throbbing constant pain and popping. Noticeable stiffness over the last month or so  The past, family history and social history have been reviewed and are recorded in the corresponding sections of epic   Vital signs:   General the patient is well-developed and well-nourished grooming and hygiene are normal Oriented x3 Mood and affect normal Ambulation normal  Inspection of the left shoulder reveals decreased range of motion internal rotation as well as external rotation she also has decreased for elevation. Shoulder remained stable. Motor exam is normal scans intact there is tenderness diffusely about the shoulder. The scope neurovascular exam is intact  Right shoulder full range of motion   Cardiovascular exam is normal Sensory exam normal  X-ray and MRI already performed no evidence of rotator cuff tear  Impression frozen shoulder  Recommend physical therapy and Tylenol with codeine for pain as she is experiencing more has experienced nausea from hydrocodone  Followup in 2 months

## 2013-07-17 ENCOUNTER — Ambulatory Visit (HOSPITAL_COMMUNITY): Payer: Medicare Other | Admitting: Oncology

## 2013-07-23 ENCOUNTER — Ambulatory Visit (HOSPITAL_COMMUNITY)
Admission: RE | Admit: 2013-07-23 | Discharge: 2013-07-23 | Disposition: A | Discharge status: Home / Self Care | Payer: Medicare Other | Source: Ambulatory Visit | Attending: Orthopedic Surgery | Admitting: Orthopedic Surgery

## 2013-07-23 DIAGNOSIS — M7500 Adhesive capsulitis of unspecified shoulder: Secondary | ICD-10-CM

## 2013-07-23 DIAGNOSIS — J4489 Other specified chronic obstructive pulmonary disease: Secondary | ICD-10-CM | POA: Insufficient documentation

## 2013-07-23 DIAGNOSIS — F17200 Nicotine dependence, unspecified, uncomplicated: Secondary | ICD-10-CM | POA: Insufficient documentation

## 2013-07-23 DIAGNOSIS — J449 Chronic obstructive pulmonary disease, unspecified: Secondary | ICD-10-CM | POA: Insufficient documentation

## 2013-07-23 DIAGNOSIS — M25519 Pain in unspecified shoulder: Secondary | ICD-10-CM | POA: Insufficient documentation

## 2013-07-23 DIAGNOSIS — M25619 Stiffness of unspecified shoulder, not elsewhere classified: Secondary | ICD-10-CM | POA: Insufficient documentation

## 2013-07-23 DIAGNOSIS — IMO0001 Reserved for inherently not codable concepts without codable children: Secondary | ICD-10-CM | POA: Insufficient documentation

## 2013-07-23 NOTE — Evaluation (Signed)
Occupational Therapy Evaluation  Patient Details  Name: [name] MRN: [id number] Date of Birth: July 09, 1960  Today's Date: 07/23/2013 Time: 1350-1430 OT Time Calculation (min): 40 min OT Evaluation 1350-1405 15' Manual therapy 1405-1420 15'  heat no charge Visit#: 1 of 16  Re-eval: 08/20/13  Assessment Diagnosis: Left Shoulder Adhesive Capsulitis Next MD Visit: 2 months Prior Therapy: n/a  Authorization: Medicare   Authorization Time Period: before 10th visit  Authorization Visit#: 1 of 10   Past Medical History:  Past Medical History  Diagnosis Date  . Breast cancer   . Neuropathy   . Osteoporosis   . Depression   . Breast cancer 04/21/2012    Stage II (T1c N1 M0) grade 3 triple negative breast cancer, right- sided with 1 of 5 positive nodes, status post FEC in a dose dense fashion for 6 cycles followed by radiation therapy by Dr. Sondra [name] for her high-risk disease with her initial date of surgery in February 2008.   [name] GERD (gastroesophageal reflux disease) 12/18/2012   Past Surgical History:  Past Surgical History  Procedure Laterality Date  . Tubal ligation    . Mastectomy      bilateral  . Esophagogastroduodenoscopy  03/10/2008    SLF: Normal esophagus without evidence of Barrett, mass, erosion  ulceration, or stricture  . Leocolonoscopy  03/10/2008    YYQ:MGNOIB terminal ileum, approximately 10 cm visualized/Normal colon without evidence of polyps, mass, inflammatory changes, diverticula, or AVMs/Normal retroflexed view of the rectum/Patchy erythema in the antrum without erosion or ulceration    Subjective Symptoms/Limitations Symptoms: S:  I can't reach overhead, or behind my back very well. Pertinent History: [name] reports that she woke up one day in October with horrible shoulder pain that did not go away.  She consulted with her PCP and received a cortisone injection, which did not alleviate her symptoms.  She had an xray, ultrasound, and MRI and was  diagnosed with left shoulder adhesive capsulitis by Dr. Aline [name].  She has been referred to occupational therapy for evaluation and treatment. Limitations: progress as tolerated  Patient has history of bilateral breast cancer, double mastectomy and reconstructive surgery, COPD Special Tests: FOTO 31/100 Pain Assessment Currently in Pain?: Yes Pain Score: 10-Worst pain ever Pain Location: Shoulder Pain Orientation: Left Pain Type: Acute pain Effect of Pain on Daily Activities: pain is 0/10 at rest and increases to 10/10 when reaching or actively using her left arm.   Precautions/Restrictions  Precautions Precautions: None Restrictions Weight Bearing Restrictions: No  Balance Screening Balance Screen Has the patient fallen in the past 6 months: No  Prior [place] expects to be discharged to:: Private residence Additional Comments: lives alone Prior Function Level of Independence: Independent with basic ADLs;Independent with homemaking with ambulation  Able to Take Stairs?: Yes Driving: Yes Vocation: On disability Comments: enjoys swimming, hiking, sports and watching her grandson play sports  Assessment ADL/Vision/Perception ADL ADL Comments: Patient is having difficulty raising her left arm over her head, pulling up her pants, getting dressed,and washing her hair. Dominant Hand: Left Vision - History Baseline Vision: Wears glasses all the time Perception Perception: Within Functional Limits Praxis Praxis: Intact  Cognition/Observation Cognition Overall Cognitive Status: Within Functional Limits for tasks assessed  Sensation/Coordination/Edema Sensation Light Touch: Appears Intact Coordination Gross Motor Movements are Fluid and Coordinated: Yes Fine Motor Movements are Fluid and Coordinated: Yes Edema Edema: n/a  Additional Assessments LUE AROM (degrees) LUE Overall AROM Comments: assessed in standing, ER/IR with shoulder  adducted  Left Shoulder Flexion: 110 Degrees Left Shoulder ABduction: 30 Degrees Left Shoulder Internal Rotation: 70 Degrees Left Shoulder External Rotation: 35 Degrees LUE PROM (degrees) LUE Overall PROM Comments: assessed in standing, ER/IR with shoulder adducted Left Shoulder Flexion: 130 Degrees Left Shoulder ABduction: 75 Degrees Left Shoulder Internal Rotation: 70 Degrees Left Shoulder External Rotation: 44 Degrees LUE Strength LUE Overall Strength Comments: assessed in standing, in available range Left Shoulder Flexion: 3+/5 Left Shoulder ABduction: 3-/5 Left Shoulder Internal Rotation: 4/5 Left Shoulder External Rotation: 3-/5 Palpation Palpation: max fascial restrictions noted in left shoulder region     Exercise/Treatments    Modalities Modalities: Moist Heat Manual Therapy Manual Therapy: Myofascial release Myofascial Release: MFR and manual stretching to left upper arm, shoulder, and scapular region to decrease pain and fascial restrictions and increase pain free mobiilty in left shoulder. Moist Heat Therapy Number Minutes Moist Heat: 10 Minutes Moist Heat Location: Shoulder  Occupational Therapy Assessment and Plan OT Assessment and Plan Clinical Impression Statement: A:  Patient referred to occupational therapy due to decreased AROM and strength, and increased pain and fascial restrictions in her left shoulder due to adhesive capsulitis Pt will benefit from skilled therapeutic intervention in order to improve on the following deficits: Decreased range of motion;Decreased strength;Pain;Increased fascial restricitons;Increased muscle spasms Rehab Potential: Good OT Frequency: Min 2X/week OT Duration: 8 weeks OT Treatment/Interventions: Self-care/ADL training;Therapeutic exercise;Patient/family education;Modalities;Manual therapy;Therapeutic activities OT Plan: P:  Skilled OT intervention to decrease pain and fascial restrictions and improve pain free mobility in  her left shoulder region in order to return to use of left upper extremity as dominant.  Treatment Plan:  MFR and manual stretching in supine, PROM progressing to AAROM and AROM, as tolerated.  Scapular mobilizations, ball stretches, thumbtacks, prot.ret.elev.dep.  progress as tolerated   Goals Short Term Goals Time to Complete Short Term Goals: 4 weeks Short Term Goal 1: Patient will be educated on HEP. Short Term Goal 2: Patient will increase left shoulder PROM to [facility] for increased ability to don and doff shirts and coats. Short Term Goal 3: Patient will increase left shoulder strength to 4-/5 for increased ability to lift bags of groceries. Short Term Goal 4: Patient will decrease left shoulder pain to 5/10 when completing daily activities. Short Term Goal 5: Patient will decrease left shoulder restrictions to moderate. Long Term Goals Time to Complete Long Term Goals: 8 weeks Long Term Goal 1: Patient will return to prior level of function and use of left arm as dominant with all daily activities.  Long Term Goal 2: Patient will increase left shoulder AROM to WNL for increased ability to reach overhead, comb her hair and pull up her pants.  Long Term Goal 3: Patient will increase left shoulder strength to 4+/5 for increased ability to lift bags of groceries. Long Term Goal 4: Patient will decrease left shoulder pain to 2/10 when completing daily activities. Long Term Goal 5: Patient will decrease left shoulder restrictions to minimal.  Problem List Patient Active Problem List   Diagnosis Date Noted  . Pain in joint, shoulder region 07/23/2013  . Decreased range of motion of shoulder 07/23/2013  . 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 Tobacco abuse 12/18/2012  . GERD (gastroesophageal reflux disease) 12/18/2012  . Breast cancer 04/21/2012    End of Session Activity Tolerance: Patient tolerated treatment well General Behavior During Therapy: [facility] for tasks assessed/performed OT  Plan of Care OT Home Exercise Plan: towel slides OT Patient Instructions: issued handout, handout  scanned Consulted and Agree with Plan of Care: Patient  GO Functional Assessment Tool Used: FOTO scored 31/100 = 69% impairment level  Functional Limitation: Carrying, moving and handling objects Carrying, Moving and Handling Objects Current Status ([id number]): At least 60 percent but less than 80 percent impaired, limited or restricted Carrying, Moving and Handling Objects Goal Status [phone]): At least 40 percent but less than 60 percent impaired, limited or restricted  Vangie Bicker, OTR/L  07/23/2013, 4:58 PM  Physician Documentation Your signature is required to indicate approval of the treatment plan as stated above.  Please sign and either send electronically or make a copy of this report for your files and return this physician signed original.  Please mark one 1.__approve of plan  2. ___approve of plan with the following conditions.   ______________________________                                                          _____________________ Physician Signature                                                                                                             Date

## 2013-07-27 ENCOUNTER — Ambulatory Visit (HOSPITAL_COMMUNITY)
Admission: RE | Admit: 2013-07-27 | Discharge: 2013-07-27 | Disposition: A | Discharge status: Home / Self Care | Payer: Medicare Other | Source: Ambulatory Visit | Attending: Internal Medicine | Admitting: Internal Medicine

## 2013-07-27 NOTE — Progress Notes (Signed)
Occupational Therapy Treatment Patient Details  Name: [name] MRN: [id number] Date of Birth: Aug 27, 1960  Today's Date: 07/27/2013 Time: 5361-4431 OT Time Calculation (min): 40 min Manual 1347-1410 (23') Therapeutic Exercises 5400-8676 (78')  Visit#: 2 of 16  Re-eval: 08/20/13    Authorization: Medicare   Authorization Time Period: before 10th visit  Authorization Visit#: 2 of 10  Subjective Symptoms/Limitations Symptoms: Its getting better with the exericses, its not as bad. I haven't been movign it much either." Pain Assessment Currently in Pain?: Yes Pain Score: 5  Pain Location: Shoulder Pain Orientation: Left Pain Type: Acute pain  Precautions/Restrictions     Exercise/Treatments Supine Protraction: PROM;10 reps Horizontal ABduction: PROM;10 reps External Rotation: PROM;10 reps Internal Rotation: PROM;10 reps Flexion: PROM;10 reps ABduction: PROM;10 reps Other Supine Exercises: bridges 15x Seated Elevation: AROM;15 reps Extension: AROM;15 reps Row: AROM;15 reps Therapy Ball Flexion: 15 reps ABduction: 15 reps ROM / Strengthening / Isometric Strengthening Prot/Ret//Elev/Dep: 10 reps      Manual Therapy Manual Therapy: Myofascial release Myofascial Release: MFR and manual stretching to left upper arm, shoulder, and scapular region to decrease pain and fascial restrictions and increase pain free mobiilty in left shoulder.    Occupational Therapy Assessment and Plan OT Assessment and Plan Clinical Impression Statement: Pt had increased pain with ball stretches in abudction. Overall decreased range in PROM - flexion to just above 90, abduction to only 90, and external rotation to neutral.  Pt tolerated well all PROM and scapular AROM this session. OT Plan: P: Continue PROM to increase range. MFR. Add thumntacks.   Goals Short Term Goals Short Term Goal 1: Patient will be educated on HEP. Short Term Goal 1 Progress: Progressing toward goal Short  Term Goal 2: Patient will increase left shoulder PROM to [facility] for increased ability to don and doff shirts and coats. Short Term Goal 2 Progress: Progressing toward goal Short Term Goal 3: Patient will increase left shoulder strength to 4-/5 for increased ability to lift bags of groceries. Short Term Goal 3 Progress: Progressing toward goal Short Term Goal 4: Patient will decrease left shoulder pain to 5/10 when completing daily activities. Short Term Goal 4 Progress: Progressing toward goal Short Term Goal 5: Patient will decrease left shoulder restrictions to moderate. Short Term Goal 5 Progress: Progressing toward goal Long Term Goals Long Term Goal 1: Patient will return to prior level of function and use of left arm as dominant with all daily activities.  Long Term Goal 1 Progress: Progressing toward goal Long Term Goal 2: Patient will increase left shoulder AROM to WNL for increased ability to reach overhead, comb her hair and pull up her pants.  Long Term Goal 2 Progress: Progressing toward goal Long Term Goal 3: Patient will increase left shoulder strength to 4+/5 for increased ability to lift bags of groceries. Long Term Goal 3 Progress: Progressing toward goal Long Term Goal 4: Patient will decrease left shoulder pain to 2/10 when completing daily activities. Long Term Goal 4 Progress: Progressing toward goal Long Term Goal 5: Patient will decrease left shoulder restrictions to minimal. Long Term Goal 5 Progress: Progressing toward goal  Problem List Patient Active Problem List   Diagnosis Date Noted  . Pain in joint, shoulder region 07/23/2013  . Decreased range of motion of shoulder 07/23/2013  . 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 Tobacco abuse 12/18/2012  . GERD (gastroesophageal reflux disease) 12/18/2012  . Breast cancer 04/21/2012    End of Session Activity Tolerance: Patient tolerated  treatment well General Behavior During Therapy: [facility] for tasks  assessed/performed  GO    Bea Graff, MS, OTR/L [phone]  07/27/2013, 2:29 PM

## 2013-07-29 ENCOUNTER — Inpatient Hospital Stay (HOSPITAL_COMMUNITY): Admission: RE | Admit: 2013-07-29 | Payer: Medicare Other | Source: Ambulatory Visit

## 2013-08-05 ENCOUNTER — Ambulatory Visit (HOSPITAL_COMMUNITY)
Admission: RE | Admit: 2013-08-05 | Discharge: 2013-08-05 | Disposition: A | Discharge status: Home / Self Care | Payer: Medicare Other | Source: Ambulatory Visit | Attending: Internal Medicine | Admitting: Internal Medicine

## 2013-08-05 DIAGNOSIS — M25619 Stiffness of unspecified shoulder, not elsewhere classified: Secondary | ICD-10-CM

## 2013-08-05 DIAGNOSIS — F17200 Nicotine dependence, unspecified, uncomplicated: Secondary | ICD-10-CM | POA: Insufficient documentation

## 2013-08-05 DIAGNOSIS — M25519 Pain in unspecified shoulder: Secondary | ICD-10-CM

## 2013-08-05 DIAGNOSIS — J4489 Other specified chronic obstructive pulmonary disease: Secondary | ICD-10-CM | POA: Insufficient documentation

## 2013-08-05 DIAGNOSIS — J449 Chronic obstructive pulmonary disease, unspecified: Secondary | ICD-10-CM | POA: Insufficient documentation

## 2013-08-05 DIAGNOSIS — IMO0001 Reserved for inherently not codable concepts without codable children: Secondary | ICD-10-CM | POA: Insufficient documentation

## 2013-08-05 NOTE — Progress Notes (Signed)
Occupational Therapy Treatment Patient Details  Name: [name] MRN: [id number] Date of Birth: 1960/05/27  Today's Date: 08/05/2013 Time: 7290-2111 OT Time Calculation (min): 37 min Manual therapy 1350-1405 15' Therapeutic exercises 5520-8022 22'  Visit#: 3 of 16  Re-eval: 08/20/13    Authorization: Medicare   Authorization Time Period: before 10th visit  Authorization Visit#: 3 of 10  Subjective  S:  Its still sore, just not quite as bad.   Pain Assessment Currently in Pain?: Yes Pain Score: 5  Pain Location: Shoulder Pain Orientation: Left Pain Type: Acute pain  Precautions/Restrictions     Exercise/Treatments Supine Protraction: PROM;AAROM;10 reps Horizontal ABduction: PROM;AAROM;10 reps External Rotation: PROM;AAROM;10 reps Internal Rotation: PROM;AAROM;10 reps Flexion: PROM;AAROM;10 reps ABduction: PROM;AAROM;10 reps Seated Elevation: AROM;15 reps Extension: AROM;15 reps Row: AROM;15 reps Pulleys Flexion: 2 minutes ABduction: 2 minutes Therapy Ball Flexion: 20 reps ABduction: 20 reps ROM / Strengthening / Isometric Strengthening Thumb Tacks: 1' Prot/Ret//Elev/Dep: 1' with max tactile facilitation from OTR/L for correct positioning.       Manual Therapy Manual Therapy: Myofascial release Myofascial Release: MFR and manual stretching to left upper arm, shoulder, and scapular region to decrease pain and fascial restrictions and increase pain free mobiilty in left shoulder.   Occupational Therapy Assessment and Plan OT Assessment and Plan Clinical Impression Statement: A:  Patient able to stretch herself a bit further into external rotation with dowel rod vs PROM due to guarding.  Required max tactile faciliation to retraction scapula. OT Plan: P:  Add scapular mobilizations to improve pain free mobility.    Goals    Problem List Patient Active Problem List   Diagnosis Date Noted  . Pain in joint, shoulder region 07/23/2013  . Decreased range of  motion of shoulder 07/23/2013  . 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 Tobacco abuse 12/18/2012  . GERD (gastroesophageal reflux disease) 12/18/2012  . Breast cancer 04/21/2012    End of Session Activity Tolerance: Patient tolerated treatment well General Behavior During Therapy: [facility] for tasks assessed/performed  GO    Vangie Bicker, OTR/L  08/05/2013, 2:31 PM

## 2013-08-06 ENCOUNTER — Ambulatory Visit (HOSPITAL_COMMUNITY)
Admission: RE | Admit: 2013-08-06 | Discharge: 2013-08-06 | Disposition: A | Discharge status: Home / Self Care | Payer: Medicare Other | Source: Ambulatory Visit | Attending: Internal Medicine | Admitting: Internal Medicine

## 2013-08-06 NOTE — Progress Notes (Signed)
Occupational Therapy Treatment Patient Details  Name: [name] MRN: [id number] Date of Birth: 1960/10/13  Today's Date: 08/06/2013 Time: 5027-7412 OT Time Calculation (min): 47 min Manual 1525-1555 (30') Therapeutic Exercises 8786-7672 (75')  Visit#: 4 of 16  Re-eval: 08/20/13    Authorization: Medicare   Authorization Time Period: before 10th visit  Authorization Visit#: 4 of 10  Subjective Symptoms/Limitations Symptoms: "Sore, because I worked out yesterday." Pain Assessment Currently in Pain?: Yes Pain Score: 5  Pain Location: Shoulder Pain Orientation: Left Pain Type: Acute pain  Precautions/Restrictions     Exercise/Treatments Supine Protraction: PROM;AAROM;10 reps Horizontal ABduction: PROM;AAROM;10 reps External Rotation: PROM;AAROM;10 reps Internal Rotation: PROM;AAROM;10 reps Flexion: PROM;AAROM;10 reps ABduction: PROM;AAROM;10 reps Seated Elevation: AROM;20 reps Extension: AROM;20 reps Row: AROM;20 reps Pulleys Flexion: 2 minutes ABduction: 2 minutes ROM / Strengthening / Isometric Strengthening Thumb Tacks: 1' Prot/Ret//Elev/Dep: 1' with max tactile facilitation from OTR/L for correct positioning.       Manual Therapy Manual Therapy: Scapular mobilization Myofascial Release: MFR and manual stretching to left upper arm, shoulder, and scapular region to decrease pain and fascial restrictions and increase pain free mobiilty in left shoulder.   Scapular Mobilization: Mobilization to scapula in supine to promote decreased tightness, pain free ROM, and improved scapuar movements  Occupational Therapy Assessment and Plan OT Assessment and Plan Clinical Impression Statement: Pt had improved tolerance of PROM this session and signifiantly less pain with AAROM.  Scapular retraction still requires max facilitation. OT Plan: Add finger ladder. Increase supine AAROM reps.   Goals Short Term Goals Short Term Goal 1: Patient will be educated on  HEP. Short Term Goal 1 Progress: Progressing toward goal Short Term Goal 2: Patient will increase left shoulder PROM to [facility] for increased ability to don and doff shirts and coats. Short Term Goal 2 Progress: Progressing toward goal Short Term Goal 3: Patient will increase left shoulder strength to 4-/5 for increased ability to lift bags of groceries. Short Term Goal 3 Progress: Progressing toward goal Short Term Goal 4: Patient will decrease left shoulder pain to 5/10 when completing daily activities. Short Term Goal 4 Progress: Progressing toward goal Short Term Goal 5: Patient will decrease left shoulder restrictions to moderate. Short Term Goal 5 Progress: Progressing toward goal Long Term Goals Long Term Goal 1: Patient will return to prior level of function and use of left arm as dominant with all daily activities.  Long Term Goal 1 Progress: Progressing toward goal Long Term Goal 2: Patient will increase left shoulder AROM to WNL for increased ability to reach overhead, comb her hair and pull up her pants.  Long Term Goal 2 Progress: Progressing toward goal Long Term Goal 3: Patient will increase left shoulder strength to 4+/5 for increased ability to lift bags of groceries. Long Term Goal 3 Progress: Progressing toward goal Long Term Goal 4: Patient will decrease left shoulder pain to 2/10 when completing daily activities. Long Term Goal 4 Progress: Progressing toward goal Long Term Goal 5: Patient will decrease left shoulder restrictions to minimal. Long Term Goal 5 Progress: Progressing toward goal  Problem List Patient Active Problem List   Diagnosis Date Noted  . Pain in joint, shoulder region 07/23/2013  . Decreased range of motion of shoulder 07/23/2013  . 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 Tobacco abuse 12/18/2012  . GERD (gastroesophageal reflux disease) 12/18/2012  . Breast cancer 04/21/2012    End of Session Activity Tolerance: Patient tolerated treatment  well General Behavior During Therapy: [facility]  for tasks assessed/performed  GO   Bea Graff, MS, OTR/L [phone]  08/06/2013, 4:15 PM

## 2013-08-11 ENCOUNTER — Ambulatory Visit (HOSPITAL_COMMUNITY)
Admission: RE | Admit: 2013-08-11 | Discharge: 2013-08-11 | Disposition: A | Discharge status: Home / Self Care | Payer: Medicare Other | Source: Ambulatory Visit | Attending: Internal Medicine | Admitting: Internal Medicine

## 2013-08-11 DIAGNOSIS — M25519 Pain in unspecified shoulder: Secondary | ICD-10-CM

## 2013-08-11 DIAGNOSIS — M25619 Stiffness of unspecified shoulder, not elsewhere classified: Secondary | ICD-10-CM

## 2013-08-11 NOTE — Progress Notes (Signed)
Occupational Therapy Treatment Patient Details  Name: [name] MRN: [id number] Date of Birth: 1960/08/10  Today's Date: 08/11/2013 Time: 9937-1696 OT Time Calculation (min): 40 min Manual therapy 7893-8101 17' Therapeutic exercises 7510-2585 23' Visit#: 5 of 16  Re-eval: 08/20/13    Authorization: Medicare   Authorization Time Period: before 10th visit  Authorization Visit#: 5 of 10  Subjective Symptoms/Limitations Symptoms: S:  It's sore after we work together but it is a good pain.  Limitations: progress as tolerated  Patient has history of bilateral breast cancer, double mastectomy and reconstructive surgery, COPD Pain Assessment Currently in Pain?: No/denies  Precautions/Restrictions   progress as tolerated  Exercise/Treatments Supine Protraction: PROM;10 reps;AAROM;15 reps Horizontal ABduction: PROM;10 reps;AAROM;15 reps External Rotation: PROM;10 reps;AAROM;15 reps Internal Rotation: PROM;10 reps;AAROM;15 reps Flexion: PROM;10 reps;AAROM;15 reps ABduction: PROM;10 reps;AAROM;15 reps Seated Protraction: AAROM;10 reps Horizontal ABduction: AAROM;10 reps External Rotation: AAROM;10 reps Internal Rotation: AAROM;10 reps Flexion: AAROM;10 reps Abduction: AAROM;10 reps Standing Other Standing Exercises: finger ladder for flexion to top rung X 5, abduction to 8 X 2 with max tactile cues to maintain positioning Pulleys Flexion: 2 minutes ABduction: 2 minutes Therapy Ball Flexion: 20 reps ABduction: 20 reps Right/Left:  (attempted and unable due to lack of range of movement)      Manual Therapy Manual Therapy: Myofascial release Myofascial Release: MFR and manual stretching to left upper arm, shoulder, and scapular region to decrease pain and fascial restrictions and increase pain free mobiilty in left shoulder.  Scapular Mobilization: Mobilization to scapula in supine to promote decreased tightness, pain free ROM, and improved scapuar  movements  Occupational Therapy Assessment and Plan OT Assessment and Plan Clinical Impression Statement: A:  Increased AAROM repetitions to 15, added scapular mobility in seated.  OT Plan: P:  Increase available range with PROM, increase to 10 or better with abduction finger ladder with less cuing for positioning.     Goals Short Term Goals Short Term Goal 1: Patient will be educated on HEP. Short Term Goal 1 Progress: Progressing toward goal Short Term Goal 2: Patient will increase left shoulder PROM to [facility] for increased ability to don and doff shirts and coats. Short Term Goal 2 Progress: Progressing toward goal Short Term Goal 3: Patient will increase left shoulder strength to 4-/5 for increased ability to lift bags of groceries. Short Term Goal 3 Progress: Progressing toward goal Short Term Goal 4: Patient will decrease left shoulder pain to 5/10 when completing daily activities. Short Term Goal 4 Progress: Progressing toward goal Short Term Goal 5: Patient will decrease left shoulder restrictions to moderate. Short Term Goal 5 Progress: Progressing toward goal Long Term Goals Long Term Goal 1: Patient will return to prior level of function and use of left arm as dominant with all daily activities.  Long Term Goal 1 Progress: Progressing toward goal Long Term Goal 2: Patient will increase left shoulder AROM to WNL for increased ability to reach overhead, comb her hair and pull up her pants.  Long Term Goal 2 Progress: Progressing toward goal Long Term Goal 3: Patient will increase left shoulder strength to 4+/5 for increased ability to lift bags of groceries. Long Term Goal 3 Progress: Progressing toward goal Long Term Goal 4: Patient will decrease left shoulder pain to 2/10 when completing daily activities. Long Term Goal 4 Progress: Progressing toward goal Long Term Goal 5: Patient will decrease left shoulder restrictions to minimal. Long Term Goal 5 Progress: Progressing toward  goal  Problem List Patient Active  Problem List   Diagnosis Date Noted  . Pain in joint, shoulder region 07/23/2013  . Decreased range of motion of shoulder 07/23/2013  . 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 Tobacco abuse 12/18/2012  . GERD (gastroesophageal reflux disease) 12/18/2012  . Breast cancer 04/21/2012    End of Session Activity Tolerance: Patient tolerated treatment well General Behavior During Therapy: [facility] for tasks assessed/performed  GO    Vangie Bicker, OTR/L  08/11/2013, 2:35 PM

## 2013-08-13 ENCOUNTER — Telehealth (HOSPITAL_COMMUNITY): Payer: Self-pay

## 2013-08-13 ENCOUNTER — Inpatient Hospital Stay (HOSPITAL_COMMUNITY): Admission: RE | Admit: 2013-08-13 | Payer: Medicare Other | Source: Ambulatory Visit

## 2013-08-18 ENCOUNTER — Ambulatory Visit (HOSPITAL_COMMUNITY)
Admission: RE | Admit: 2013-08-18 | Discharge: 2013-08-18 | Disposition: A | Discharge status: Home / Self Care | Payer: Medicare Other | Source: Ambulatory Visit | Attending: Internal Medicine | Admitting: Internal Medicine

## 2013-08-18 NOTE — Progress Notes (Signed)
Occupational Therapy Treatment Patient Details  Name: [name] MRN: [id number] Date of Birth: [birth year]  Today's Date: 08/18/2013 Time: 0998-3382 OT Time Calculation (min): 42 min Manual 1356-1415 (21') Therapeutic Exercises 1415-1438 (85')  Visit#: 6 of 53  Re-eval: 08/20/13    Authorization: Medicare   Authorization Time Period: before 10th visit  Authorization Visit#: 6 of 10  Subjective Symptoms/Limitations Symptoms: "Its sore, but atleast I'm able to do." Pain Assessment Currently in Pain?: Yes Pain Score: 3  Pain Location: Shoulder Pain Orientation: Left Pain Type: Acute pain  Precautions/Restrictions     Exercise/Treatments Supine Protraction: PROM;10 reps;AAROM;15 reps Horizontal ABduction: PROM;10 reps;AAROM;15 reps External Rotation: PROM;10 reps;AAROM;15 reps Internal Rotation: PROM;10 reps;AAROM;15 reps Flexion: PROM;10 reps;AAROM;15 reps ABduction: PROM;10 reps;AAROM;15 reps Seated Protraction: AAROM;12 reps Horizontal ABduction: AAROM;12 reps External Rotation: AAROM;12 reps Internal Rotation: AAROM;12 reps Flexion: AAROM;12 reps Abduction: AAROM;12 reps Standing Other Standing Exercises: figner ladder in flexion to top rung x5, in abduction to 15 x5 with min cues to mainain positioning   Manual Therapy Manual Therapy: Myofascial release Myofascial Release: MFR and manual stretching to left upper arm, shoulder, and scapular region to decrease pain and fascial restrictions and increase pain free mobiilty in left shoulder.    Occupational Therapy Assessment and Plan OT Assessment and Plan Clinical Impression Statement: pt had significant improvement in abduction at finger ladder this session. Increased seated AAROM reps with no complaints of pain. Pt tolerated well all exercises. OT Plan: Re-assessment   Goals Short Term Goals Short Term Goal 1: Patient will be educated on HEP. Short Term Goal 1 Progress: Progressing toward goal Short  Term Goal 2: Patient will increase left shoulder PROM to [facility] for increased ability to don and doff shirts and coats. Short Term Goal 2 Progress: Progressing toward goal Short Term Goal 3: Patient will increase left shoulder strength to 4-/5 for increased ability to lift bags of groceries. Short Term Goal 3 Progress: Progressing toward goal Short Term Goal 4: Patient will decrease left shoulder pain to 5/10 when completing daily activities. Short Term Goal 4 Progress: Progressing toward goal Short Term Goal 5: Patient will decrease left shoulder restrictions to moderate. Short Term Goal 5 Progress: Progressing toward goal Long Term Goals Long Term Goal 1: Patient will return to prior level of function and use of left arm as dominant with all daily activities.  Long Term Goal 1 Progress: Progressing toward goal Long Term Goal 2: Patient will increase left shoulder AROM to WNL for increased ability to reach overhead, comb her hair and pull up her pants.  Long Term Goal 2 Progress: Progressing toward goal Long Term Goal 3: Patient will increase left shoulder strength to 4+/5 for increased ability to lift bags of groceries. Long Term Goal 3 Progress: Progressing toward goal Long Term Goal 4: Patient will decrease left shoulder pain to 2/10 when completing daily activities. Long Term Goal 4 Progress: Progressing toward goal Long Term Goal 5: Patient will decrease left shoulder restrictions to minimal. Long Term Goal 5 Progress: Progressing toward goal  Problem List Patient Active Problem List   Diagnosis Date Noted  . Pain in joint, shoulder region 07/23/2013  . Decreased range of motion of shoulder 07/23/2013  . 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 Tobacco abuse 12/18/2012  . GERD (gastroesophageal reflux disease) 12/18/2012  . Breast cancer 04/21/2012    End of Session Activity Tolerance: Patient tolerated treatment well General Behavior During Therapy: [facility] for tasks  assessed/performed  GO  Bea Graff, MS, OTR/L [phone]  08/18/2013, 4:10 PM

## 2013-08-20 ENCOUNTER — Ambulatory Visit (HOSPITAL_COMMUNITY): Payer: Medicare Other | Admitting: Specialist

## 2013-09-15 ENCOUNTER — Ambulatory Visit: Payer: Medicare Other | Admitting: Orthopedic Surgery

## 2013-09-23 ENCOUNTER — Telehealth (HOSPITAL_COMMUNITY): Payer: Self-pay

## 2013-09-23 NOTE — Progress Notes (Signed)
  Discharge Note  Patient Details  Name: [name] MRN: [id number] Date of Birth: [birth year]  Today's Date: 09/23/2013 Patient has not attend therapy since 08/18/13 for that reason patient will be discharged from [facility]. Spoke with patient this date to confirm.   Ailene Ravel, OTR/L,CBIS   09/23/2013, 3:27 PM

## 2013-09-23 NOTE — Telephone Encounter (Signed)
Date: 09/23/13  Patient had not been to therapy since 08/18/13. Patient states she is feeling better and would like to be discharged.   Ailene Ravel, OTR/L,CBIS

## 2013-09-23 NOTE — Addendum Note (Signed)
Encounter addended by: Debby Bud, OT on: 09/23/2013  3:29 PM<BR>     Documentation filed: Episodes, Notes Section

## 2013-12-18 ENCOUNTER — Encounter (HOSPITAL_COMMUNITY): Payer: Medicare Other

## 2013-12-19 NOTE — Progress Notes (Signed)
This encounter was created in error - please disregard.

## 2014-01-04 ENCOUNTER — Encounter (HOSPITAL_BASED_OUTPATIENT_CLINIC_OR_DEPARTMENT_OTHER): Payer: Medicare Other

## 2014-01-04 ENCOUNTER — Encounter (HOSPITAL_COMMUNITY): Payer: Medicare Other | Attending: Hematology and Oncology

## 2014-01-04 ENCOUNTER — Encounter (HOSPITAL_COMMUNITY): Payer: Self-pay

## 2014-01-04 VITALS — BP 105/58 | HR 94 | Temp 98.3°F | Resp 22 | Wt 159.5 lb

## 2014-01-04 DIAGNOSIS — Z171 Estrogen receptor negative status [ER-]: Secondary | ICD-10-CM | POA: Insufficient documentation

## 2014-01-04 DIAGNOSIS — C50919 Malignant neoplasm of unspecified site of unspecified female breast: Secondary | ICD-10-CM | POA: Diagnosis present

## 2014-01-04 DIAGNOSIS — K219 Gastro-esophageal reflux disease without esophagitis: Secondary | ICD-10-CM

## 2014-01-04 DIAGNOSIS — M719 Bursopathy, unspecified: Secondary | ICD-10-CM | POA: Insufficient documentation

## 2014-01-04 DIAGNOSIS — M67919 Unspecified disorder of synovium and tendon, unspecified shoulder: Secondary | ICD-10-CM | POA: Diagnosis not present

## 2014-01-04 DIAGNOSIS — G589 Mononeuropathy, unspecified: Secondary | ICD-10-CM | POA: Insufficient documentation

## 2014-01-04 DIAGNOSIS — Z853 Personal history of malignant neoplasm of breast: Secondary | ICD-10-CM

## 2014-01-04 DIAGNOSIS — J42 Unspecified chronic bronchitis: Secondary | ICD-10-CM | POA: Insufficient documentation

## 2014-01-04 DIAGNOSIS — Z885 Allergy status to narcotic agent status: Secondary | ICD-10-CM | POA: Insufficient documentation

## 2014-01-04 DIAGNOSIS — M810 Age-related osteoporosis without current pathological fracture: Secondary | ICD-10-CM | POA: Insufficient documentation

## 2014-01-04 DIAGNOSIS — R05 Cough: Secondary | ICD-10-CM | POA: Diagnosis not present

## 2014-01-04 DIAGNOSIS — C50911 Malignant neoplasm of unspecified site of right female breast: Secondary | ICD-10-CM

## 2014-01-04 DIAGNOSIS — F17200 Nicotine dependence, unspecified, uncomplicated: Secondary | ICD-10-CM | POA: Insufficient documentation

## 2014-01-04 DIAGNOSIS — Z9010 Acquired absence of unspecified breast and nipple: Secondary | ICD-10-CM | POA: Insufficient documentation

## 2014-01-04 DIAGNOSIS — F329 Major depressive disorder, single episode, unspecified: Secondary | ICD-10-CM | POA: Diagnosis not present

## 2014-01-04 DIAGNOSIS — Z809 Family history of malignant neoplasm, unspecified: Secondary | ICD-10-CM | POA: Insufficient documentation

## 2014-01-04 DIAGNOSIS — F3289 Other specified depressive episodes: Secondary | ICD-10-CM | POA: Diagnosis not present

## 2014-01-04 DIAGNOSIS — R059 Cough, unspecified: Secondary | ICD-10-CM | POA: Insufficient documentation

## 2014-01-04 DIAGNOSIS — Z79899 Other long term (current) drug therapy: Secondary | ICD-10-CM | POA: Insufficient documentation

## 2014-01-04 DIAGNOSIS — R058 Other specified cough: Secondary | ICD-10-CM

## 2014-01-04 DIAGNOSIS — M7552 Bursitis of left shoulder: Secondary | ICD-10-CM

## 2014-01-04 DIAGNOSIS — Z720 Tobacco use: Secondary | ICD-10-CM

## 2014-01-04 LAB — CBC WITH DIFFERENTIAL/PLATELET
BASOS ABS: 0.1 10*3/uL (ref 0.0–0.1)
Basophils Relative: 1 % (ref 0–1)
EOS ABS: 0.2 10*3/uL (ref 0.0–0.7)
Eosinophils Relative: 3 % (ref 0–5)
HCT: 43.8 % (ref 36.0–46.0)
Hemoglobin: 15.1 g/dL — ABNORMAL HIGH (ref 12.0–15.0)
LYMPHS ABS: 2.6 10*3/uL (ref 0.7–4.0)
LYMPHS PCT: 41 % (ref 12–46)
MCH: 30.7 pg (ref 26.0–34.0)
MCHC: 34.5 g/dL (ref 30.0–36.0)
MCV: 89 fL (ref 78.0–100.0)
Monocytes Absolute: 0.4 10*3/uL (ref 0.1–1.0)
Monocytes Relative: 6 % (ref 3–12)
NEUTROS PCT: 49 % (ref 43–77)
Neutro Abs: 3.2 10*3/uL (ref 1.7–7.7)
PLATELETS: 283 10*3/uL (ref 150–400)
RBC: 4.92 MIL/uL (ref 3.87–5.11)
RDW: 13.4 % (ref 11.5–15.5)
WBC: 6.4 10*3/uL (ref 4.0–10.5)

## 2014-01-04 LAB — COMPREHENSIVE METABOLIC PANEL
ALK PHOS: 80 U/L (ref 39–117)
ALT: 27 U/L (ref 0–35)
AST: 25 U/L (ref 0–37)
Albumin: 4 g/dL (ref 3.5–5.2)
Anion gap: 13 (ref 5–15)
BUN: 8 mg/dL (ref 6–23)
CALCIUM: 9.4 mg/dL (ref 8.4–10.5)
CO2: 25 mEq/L (ref 19–32)
Chloride: 102 mEq/L (ref 96–112)
Creatinine, Ser: 0.62 mg/dL (ref 0.50–1.10)
GFR calc non Af Amer: >90 mL/min (ref >90)
GLUCOSE: 100 mg/dL — AB (ref 70–99)
POTASSIUM: 4.2 meq/L (ref 3.7–5.3)
SODIUM: 140 meq/L (ref 137–147)
TOTAL PROTEIN: 7.5 g/dL (ref 6.0–8.3)
Total Bilirubin: 0.6 mg/dL (ref 0.3–1.2)

## 2014-01-04 MED ORDER — OMEPRAZOLE 20 MG PO CPDR: 20.0000 mg | DELAYED_RELEASE_CAPSULE | Freq: Every day | ORAL | Status: DC

## 2014-01-04 NOTE — Patient Instructions (Signed)
[facility] Discharge Instructions  RECOMMENDATIONS MADE BY THE CONSULTANT AND ANY TEST RESULTS WILL BE SENT TO YOUR REFERRING PHYSICIAN.  EXAM FINDINGS BY THE PHYSICIAN TODAY AND SIGNS OR SYMPTOMS TO REPORT TO CLINIC OR PRIMARY PHYSICIAN: Exam and findings as discussed by Dr. Barnet Glasgow.   Report any new lumps, bone pain, shortness of breath or other symptoms.  Will let you know of any concerns about your labs.  MEDICATIONS PRESCRIBED:  Prilosec refilled  INSTRUCTIONS/FOLLOW-UP: Follow-up in 1 year with labs and office visit.  Thank you for choosing [facility] to provide your oncology and hematology care.  To afford each patient quality time with our providers, please arrive at least 15 minutes before your scheduled appointment time.  With your help, our goal is to use those 15 minutes to complete the necessary work-up to ensure our physicians have the information they need to help with your evaluation and healthcare recommendations.    Effective January 1st, 2014, we ask that you re-schedule your appointment with our physicians should you arrive 10 or more minutes late for your appointment.  We strive to give you quality time with our providers, and arriving late affects you and other patients whose appointments are after yours.    Again, thank you for choosing [facility].  Our hope is that these requests will decrease the amount of time that you wait before being seen by our physicians.       _____________________________________________________________  Should you have questions after your visit to [facility], please contact our office at [phone] between the hours of 8:30 a.m. and 4:30 p.m.  Voicemails left after 4:30 p.m. will not be returned until the following business day.  For prescription refill requests, have your pharmacy contact our office with your prescription refill request.     _______________________________________________________________  We hope that we have given you very good care.  You may receive a patient satisfaction survey in the mail, please complete it and return it as soon as possible.  We value your feedback!  _______________________________________________________________  Have you asked about our STAR program?  STAR stands for Survivorship Training and Rehabilitation, and this is a nationally recognized cancer care program that focuses on survivorship and rehabilitation.  Cancer and cancer treatments may cause problems, such as, pain, making you feel tired and keeping you from doing the things that you need or want to do. Cancer rehabilitation can help. Our goal is to reduce these troubling effects and help you have the best quality of life possible.  You may receive a survey from a nurse that asks questions about your current state of health.  Based on the survey results, all eligible patients will be referred to the [facility] program for an evaluation so we can better serve you!  A frequently asked questions sheet is available upon request.

## 2014-01-04 NOTE — Progress Notes (Signed)
[name] presented for labwork. Labs per MD order drawn via Peripheral Line 21 gauge needle inserted in left antecubital.  Good blood return present. Procedure without incident.  Needle removed intact. Patient tolerated procedure well.

## 2014-01-04 NOTE — Progress Notes (Signed)
[facility]  OFFICE PROGRESS NOTE  [name], MD [address] [place] [address]  DIAGNOSIS: Breast cancer, right - Plan: CBC with Differential, Comprehensive metabolic panel, CEA, Cancer antigen 27.29, CBC with Differential, Comprehensive metabolic panel, CEA, Cancer antigen 27.29  Gastroesophageal reflux disease without esophagitis - Plan: omeprazole (PRILOSEC) 20 MG capsule  Bursitis of shoulder region, left  Smoking  Productive cough - Plan: omeprazole (PRILOSEC) 20 MG capsule  Tobacco abuse - Plan: omeprazole (PRILOSEC) 20 MG capsule  Chief Complaint  Patient presents with  . Breast Cancer    CURRENT THERAPY: Watchful expectation for triple negative breast cancer diagnosed in February of 2008,  bilateral mastectomies were performed. Tumor was on the right side mostly noninvasive ductal neoplasia with a focus of invasion.  One lymph node was involved. Bilateral breast implants.  INTERVAL HISTORY: [name] 53 y.o. female returns for followup of Stage II (T1c N1 M0) grade 3 triple negative breast cancer, right- sided with 1 of 5 positive nodes, status post FEC in a dose dense fashion for 6 cycles followed by radiation therapy by Dr. Sondra Come for her high-risk disease with her initial date of surgery in February 2008. She is concerned about a growth on the right shoulder. Left shoulder bursitis has improved dramatically after physical therapy. She denies any lymphedema, but does have chronic cough without expectoration or hemoptysis. She denies any epistaxis, melena, hematochezia, hematuria, vaginal bleeding or urinary incontinence. She does get hot flashes. She denies any PND, orthopnea, palpitations, but has had headaches since running out of her anti-anxiety medication.   MEDICAL HISTORY: Past Medical History  Diagnosis Date  . Breast cancer   . Neuropathy   . Osteoporosis   . Depression   . Breast cancer  04/21/2012    Stage II (T1c N1 M0) grade 3 triple negative breast cancer, right- sided with 1 of 5 positive nodes, status post FEC in a dose dense fashion for 6 cycles followed by radiation therapy by Dr. Sondra Come for her high-risk disease with her initial date of surgery in February 2008.   [name] GERD (gastroesophageal reflux disease) 12/18/2012    INTERIM HISTORY: has Breast cancer; Tobacco abuse; GERD (gastroesophageal reflux disease); Frozen shoulder syndrome; Pain in joint, shoulder region; and Decreased range of motion of shoulder on her problem list.    ALLERGIES:  is allergic to vicodin.  MEDICATIONS: has a current medication list which includes the following prescription(s): acetaminophen-codeine, omeprazole, aripiprazole, citalopram, and clonazepam.  SURGICAL HISTORY:  Past Surgical History  Procedure Laterality Date  . Tubal ligation    . Mastectomy      bilateral  . Esophagogastroduodenoscopy  03/10/2008    SLF: Normal esophagus without evidence of Barrett, mass, erosion  ulceration, or stricture  . Leocolonoscopy  03/10/2008    ASN:[id number] terminal ileum, approximately 10 cm visualized/Normal colon without evidence of polyps, mass, inflammatory changes, diverticula, or AVMs/Normal retroflexed view of the rectum/Patchy erythema in the antrum without erosion or ulceration    FAMILY HISTORY: family history includes Cancer in her father.  SOCIAL HISTORY:  reports that she has been smoking Cigarettes.  She has been smoking about 1.00 pack per day. She has never used smokeless tobacco. She reports that she does not drink alcohol or use illicit drugs.  REVIEW OF SYSTEMS:  Other than that discussed above is noncontributory.  PHYSICAL EXAMINATION: ECOG PERFORMANCE STATUS: 1 - Symptomatic but completely ambulatory  Blood pressure  105/58, pulse 94, temperature 98.3 F (36.8 C), temperature source Oral, resp. rate 22, weight 159 lb 8 oz (72.349 kg), SpO2 93.00%.  GENERAL:alert, no  distress and comfortable SKIN: Right shoulder with a 0.8 cm keratosis. No evidence of ulceration or bleeding. EYES: PERLA; Conjunctiva are pink and non-injected, sclera clear SINUSES: No redness or tenderness over maxillary or ethmoid sinuses OROPHARYNX:no exudate, no erythema on lips, buccal mucosa, or tongue. NECK: supple, thyroid normal size, non-tender, without nodularity. No masses CHEST: Status post bilateral mastectomies with no axillary masses. Increased AP diameter. LYMPH:  no palpable lymphadenopathy in the cervical, axillary or inguinal LUNGS: clear to auscultation and percussion with normal breathing effort HEART: regular rate & rhythm and no murmurs. ABDOMEN:abdomen soft, non-tender and normal bowel sounds MUSCULOSKELETAL:no cyanosis of digits and no clubbing. Range of motion normal.  NEURO: alert & oriented x 3 with fluent speech, no focal motor/sensory deficits   LABORATORY DATA: Office Visit on 01/04/2014  Component Date Value Ref Range Status  . WBC 01/04/2014 6.4  4.0 - 10.5 K/uL Final  . RBC 01/04/2014 4.92  3.87 - 5.11 MIL/uL Final  . Hemoglobin 01/04/2014 15.1* 12.0 - 15.0 g/dL Final  . HCT 01/04/2014 43.8  36.0 - 46.0 % Final  . MCV 01/04/2014 89.0  78.0 - 100.0 fL Final  . MCH 01/04/2014 30.7  26.0 - 34.0 pg Final  . MCHC 01/04/2014 34.5  30.0 - 36.0 g/dL Final  . RDW 01/04/2014 13.4  11.5 - 15.5 % Final  . Platelets 01/04/2014 283  150 - 400 K/uL Final  . Neutrophils Relative % 01/04/2014 49  43 - 77 % Final  . Neutro Abs 01/04/2014 3.2  1.7 - 7.7 K/uL Final  . Lymphocytes Relative 01/04/2014 41  12 - 46 % Final  . Lymphs Abs 01/04/2014 2.6  0.7 - 4.0 K/uL Final  . Monocytes Relative 01/04/2014 6  3 - 12 % Final  . Monocytes Absolute 01/04/2014 0.4  0.1 - 1.0 K/uL Final  . Eosinophils Relative 01/04/2014 3  0 - 5 % Final  . Eosinophils Absolute 01/04/2014 0.2  0.0 - 0.7 K/uL Final  . Basophils Relative 01/04/2014 1  0 - 1 % Final  . Basophils Absolute  01/04/2014 0.1  0.0 - 0.1 K/uL Final  . Sodium 01/04/2014 140  137 - 147 mEq/L Final  . Potassium 01/04/2014 4.2  3.7 - 5.3 mEq/L Final  . Chloride 01/04/2014 102  96 - 112 mEq/L Final  . CO2 01/04/2014 25  19 - 32 mEq/L Final  . Glucose, Bld 01/04/2014 100* 70 - 99 mg/dL Final  . BUN 01/04/2014 8  6 - 23 mg/dL Final  . Creatinine, Ser 01/04/2014 0.62  0.50 - 1.10 mg/dL Final  . Calcium 01/04/2014 9.4  8.4 - 10.5 mg/dL Final  . Total Protein 01/04/2014 7.5  6.0 - 8.3 g/dL Final  . Albumin 01/04/2014 4.0  3.5 - 5.2 g/dL Final  . AST 01/04/2014 25  0 - 37 U/L Final  . ALT 01/04/2014 27  0 - 35 U/L Final  . Alkaline Phosphatase 01/04/2014 80  39 - 117 U/L Final  . Total Bilirubin 01/04/2014 0.6  0.3 - 1.2 mg/dL Final  . GFR calc non Af Amer 01/04/2014 >90  >90 mL/min Final  . GFR calc Af Amer 01/04/2014 >90  >90 mL/min Final   Comment: (NOTE)  The eGFR has been calculated using the CKD EPI equation.                          This calculation has not been validated in all clinical situations.                          eGFR's persistently <90 mL/min signify possible Chronic Kidney                          Disease.  . Anion gap 01/04/2014 13  5 - 15 Final    PATHOLOGY: Triple negative right breast cancer with one node positive.  Urinalysis    Component Value Date/Time   COLORURINE YELLOW 04/27/2008 1432   APPEARANCEUR CLEAR 04/27/2008 1432   LABSPEC 1.010 04/27/2008 1432   PHURINE 7.0 04/27/2008 1432   GLUCOSEU NEGATIVE 04/27/2008 1432   HGBUR NEGATIVE 04/27/2008 1432   BILIRUBINUR NEGATIVE 04/27/2008 1432   KETONESUR NEGATIVE 04/27/2008 1432   PROTEINUR NEGATIVE 04/27/2008 1432   UROBILINOGEN 0.2 04/27/2008 1432   NITRITE NEGATIVE 04/27/2008 1432   LEUKOCYTESUR NEGATIVE MICROSCOPIC NOT DONE ON URINES WITH NEGATIVE PROTEIN, BLOOD, LEUKOCYTES, NITRITE, OR GLUCOSE <1000 mg/dL. 04/27/2008 1432    RADIOGRAPHIC STUDIES: MR Shoulder Left W Wo Contrast Status:  Final result         PACS Images    Show images for MR Shoulder Left W Wo Contrast         Study Result    CLINICAL DATA: History of left breast cancer. Left shoulder pain  radiating into the left humerus since October, 2014.  EXAM:  MRI OF THE LEFT SHOULDER WITHOUT AND WITH CONTRAST  TECHNIQUE:  Multiplanar, multisequence MR imaging was performed both before and  after administration of intravenous contrast.  CONTRAST: 15 mL MULTIHANCE GADOBENATE DIMEGLUMINE 529 MG/ML IV SOLN  COMPARISON: Plain films left humerus 07/01/2013.  FINDINGS:  There is no evidence of metastatic disease. Mild appearing  supraspinatus, infraspinatus and subscapularis tendinopathy without  tear is identified. The long head of biceps tendon is intact. The  labrum is intact. There is fluid in the subacromial/subdeltoid bursa  with postcontrast enhancement consistent with bursitis. Mild  acromioclavicular degenerative change is seen. Mild glenohumeral  degenerative disease is also noted. Left breast implant is partially  visualized.  IMPRESSION:  Negative for metastatic disease or acute abnormality.  Mild appearing rotator cuff tendinopathy without tear.  Subacromial/subdeltoid bursitis.  Mild acromioclavicular degenerative change.  Electronically Signed  By: Inge Rise M.D.  On: 07/09/2013 11:03     ASSESSMENT:  1. Stage II (T1c N1 M0) grade 3 triple negative breast cancer, right- sided with 1 of 5 positive nodes, status post FEC in a dose dense fashion for 6 cycles followed by radiation therapy by Dr. Sondra Come for her high-risk disease with her initial date of surgery in February 2008. #2. Chronic bronchitis. #3. Depression, off medications at this time but in communication with PCP to obtain more medication. #4. Gastroesophageal reflux disease, on Prilosec. #5. Smoking 0.5 packs of cigarette.    PLAN:  #1. Continue watchful expectation. #2. Followup one year with CBC, chem profile,  CEA, CA 27-29.   All questions were answered. The patient knows to call the clinic with any problems, questions or concerns. We can certainly see the patient much sooner if necessary.   I spent 25 minutes counseling the patient face to face.  The total time spent in the appointment was 30 minutes.    Doroteo Bradford, MD 01/04/2014 11:43 AM  DISCLAIMER:  This note was dictated with voice recognition software.  Similar sounding words can inadvertently be transcribed inaccurately and may not be corrected upon review.

## 2014-01-05 LAB — CANCER ANTIGEN 27.29: CA 27.29: 7 U/mL (ref 0–39)

## 2014-01-05 LAB — CEA: CEA: 2.2 ng/mL (ref 0.0–5.0)

## 2014-02-05 ENCOUNTER — Ambulatory Visit (INDEPENDENT_AMBULATORY_CARE_PROVIDER_SITE_OTHER): Payer: Medicare Other | Admitting: Psychiatry

## 2014-02-05 ENCOUNTER — Encounter (HOSPITAL_COMMUNITY): Payer: Self-pay | Admitting: Psychiatry

## 2014-02-05 VITALS — BP 130/78 | Ht 64.0 in | Wt 156.0 lb

## 2014-02-05 DIAGNOSIS — F332 Major depressive disorder, recurrent severe without psychotic features: Secondary | ICD-10-CM

## 2014-02-05 DIAGNOSIS — F329 Major depressive disorder, single episode, unspecified: Secondary | ICD-10-CM | POA: Insufficient documentation

## 2014-02-05 DIAGNOSIS — F411 Generalized anxiety disorder: Secondary | ICD-10-CM

## 2014-02-05 DIAGNOSIS — F4310 Post-traumatic stress disorder, unspecified: Secondary | ICD-10-CM

## 2014-02-05 MED ORDER — OLANZAPINE 5 MG PO TABS: 5.0000 mg | ORAL_TABLET | Freq: Every day | ORAL | Status: DC

## 2014-02-05 MED ORDER — CLONAZEPAM 0.5 MG PO TABS: 0.5000 mg | ORAL_TABLET | Freq: Three times a day (TID) | ORAL | Status: DC

## 2014-02-05 MED ORDER — DULOXETINE HCL 60 MG PO CPEP: 60.0000 mg | ORAL_CAPSULE | Freq: Every day | ORAL | Status: DC
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
## 2014-03-05 ENCOUNTER — Encounter (HOSPITAL_COMMUNITY): Payer: Self-pay | Admitting: Psychiatry

## 2014-03-05 ENCOUNTER — Ambulatory Visit (INDEPENDENT_AMBULATORY_CARE_PROVIDER_SITE_OTHER): Payer: Medicare Other | Admitting: Psychiatry

## 2014-03-05 VITALS — BP 101/57 | HR 88 | Ht 64.0 in | Wt 168.6 lb

## 2014-03-05 DIAGNOSIS — R45851 Suicidal ideations: Secondary | ICD-10-CM

## 2014-03-05 DIAGNOSIS — F4310 Post-traumatic stress disorder, unspecified: Secondary | ICD-10-CM

## 2014-03-05 DIAGNOSIS — F332 Major depressive disorder, recurrent severe without psychotic features: Secondary | ICD-10-CM

## 2014-03-05 DIAGNOSIS — F411 Generalized anxiety disorder: Secondary | ICD-10-CM

## 2014-03-05 MED ORDER — QUETIAPINE FUMARATE 50 MG PO TABS: 50.0000 mg | ORAL_TABLET | Freq: Every day | ORAL | Status: DC

## 2014-03-05 MED ORDER — CLONAZEPAM 1 MG PO TABS: 1.0000 mg | ORAL_TABLET | Freq: Three times a day (TID) | ORAL | Status: DC

## 2014-03-05 MED ORDER — DULOXETINE HCL 60 MG PO CPEP: 60.0000 mg | ORAL_CAPSULE | Freq: Every day | ORAL | Status: DC
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
## 2014-04-06 ENCOUNTER — Ambulatory Visit (INDEPENDENT_AMBULATORY_CARE_PROVIDER_SITE_OTHER): Payer: Medicare Other | Admitting: Psychiatry

## 2014-04-06 ENCOUNTER — Encounter (HOSPITAL_COMMUNITY): Payer: Self-pay | Admitting: Psychiatry

## 2014-04-06 VITALS — BP 96/62 | HR 82 | Ht 64.0 in | Wt 166.4 lb

## 2014-04-06 DIAGNOSIS — F332 Major depressive disorder, recurrent severe without psychotic features: Secondary | ICD-10-CM

## 2014-04-06 DIAGNOSIS — F411 Generalized anxiety disorder: Secondary | ICD-10-CM

## 2014-04-06 DIAGNOSIS — F4310 Post-traumatic stress disorder, unspecified: Secondary | ICD-10-CM

## 2014-04-06 DIAGNOSIS — R45851 Suicidal ideations: Secondary | ICD-10-CM

## 2014-04-06 MED ORDER — QUETIAPINE FUMARATE 50 MG PO TABS: 50.0000 mg | ORAL_TABLET | Freq: Every day | ORAL | Status: DC

## 2014-04-06 MED ORDER — DULOXETINE HCL 60 MG PO CPEP: 60.0000 mg | ORAL_CAPSULE | Freq: Every day | ORAL | Status: DC

## 2014-04-06 MED ORDER — CLONAZEPAM 1 MG PO TABS: 1.0000 mg | ORAL_TABLET | Freq: Three times a day (TID) | ORAL | Status: DC
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
## 2014-04-08 ENCOUNTER — Ambulatory Visit (HOSPITAL_COMMUNITY): Payer: Medicare Other | Admitting: Psychology

## 2014-05-13 ENCOUNTER — Ambulatory Visit (HOSPITAL_COMMUNITY): Payer: Medicare Other | Admitting: Psychology

## 2014-05-18 ENCOUNTER — Ambulatory Visit (INDEPENDENT_AMBULATORY_CARE_PROVIDER_SITE_OTHER): Payer: Medicare Other | Admitting: Psychiatry

## 2014-05-18 ENCOUNTER — Encounter (HOSPITAL_COMMUNITY): Payer: Self-pay | Admitting: Psychiatry

## 2014-05-18 VITALS — BP 116/66 | HR 81 | Ht 64.0 in | Wt 168.4 lb

## 2014-05-18 DIAGNOSIS — F332 Major depressive disorder, recurrent severe without psychotic features: Secondary | ICD-10-CM

## 2014-05-18 DIAGNOSIS — F411 Generalized anxiety disorder: Secondary | ICD-10-CM

## 2014-05-18 DIAGNOSIS — F4310 Post-traumatic stress disorder, unspecified: Secondary | ICD-10-CM

## 2014-05-18 MED ORDER — DULOXETINE HCL 60 MG PO CPEP: 60.0000 mg | ORAL_CAPSULE | Freq: Every day | ORAL | Status: DC

## 2014-05-18 MED ORDER — QUETIAPINE FUMARATE 50 MG PO TABS: 50.0000 mg | ORAL_TABLET | Freq: Every day | ORAL | Status: DC

## 2014-05-18 MED ORDER — CLONAZEPAM 1 MG PO TABS: 1.0000 mg | ORAL_TABLET | Freq: Three times a day (TID) | ORAL | Status: DC
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
## 2014-06-08 ENCOUNTER — Ambulatory Visit (HOSPITAL_COMMUNITY): Payer: Self-pay | Admitting: Psychology

## 2014-06-08 ENCOUNTER — Telehealth (HOSPITAL_COMMUNITY): Payer: Self-pay | Admitting: *Deleted

## 2014-07-06 ENCOUNTER — Other Ambulatory Visit (HOSPITAL_COMMUNITY): Payer: Self-pay | Admitting: Psychiatry

## 2014-07-09 ENCOUNTER — Telehealth (HOSPITAL_COMMUNITY): Payer: Self-pay | Admitting: *Deleted

## 2014-07-09 NOTE — Telephone Encounter (Signed)
Phone call from patient need refill for Olanzapine.    She uses [facility].

## 2014-07-09 NOTE — Telephone Encounter (Signed)
O don't prescribe olanzapine for her. Please route to [facility] to call pharmacy

## 2014-07-12 ENCOUNTER — Ambulatory Visit (INDEPENDENT_AMBULATORY_CARE_PROVIDER_SITE_OTHER): Payer: Medicare Other | Admitting: Psychiatry

## 2014-07-12 ENCOUNTER — Encounter (HOSPITAL_COMMUNITY): Payer: Self-pay | Admitting: Psychiatry

## 2014-07-12 VITALS — BP 118/66 | HR 66 | Ht 64.0 in | Wt 179.6 lb

## 2014-07-12 DIAGNOSIS — F332 Major depressive disorder, recurrent severe without psychotic features: Secondary | ICD-10-CM

## 2014-07-12 DIAGNOSIS — F4310 Post-traumatic stress disorder, unspecified: Secondary | ICD-10-CM | POA: Diagnosis not present

## 2014-07-12 DIAGNOSIS — F411 Generalized anxiety disorder: Secondary | ICD-10-CM

## 2014-07-12 MED ORDER — CLONAZEPAM 1 MG PO TABS: 1.0000 mg | ORAL_TABLET | Freq: Four times a day (QID) | ORAL | Status: DC

## 2014-07-12 MED ORDER — QUETIAPINE FUMARATE 50 MG PO TABS: 50.0000 mg | ORAL_TABLET | Freq: Every day | ORAL | Status: DC

## 2014-07-12 MED ORDER — DULOXETINE HCL 60 MG PO CPEP: 60.0000 mg | ORAL_CAPSULE | Freq: Every day | ORAL | Status: DC

## 2014-07-12 NOTE — Telephone Encounter (Signed)
Per pt she did not know she was to stop this medication. Per Dr. Harrington Challenger to sch sooner appt for pt to discuss medications. Per pt agreed to make appt.
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
## 2014-07-15 ENCOUNTER — Ambulatory Visit (INDEPENDENT_AMBULATORY_CARE_PROVIDER_SITE_OTHER): Payer: Medicare Other | Admitting: Psychology

## 2014-07-15 DIAGNOSIS — F332 Major depressive disorder, recurrent severe without psychotic features: Secondary | ICD-10-CM | POA: Diagnosis not present

## 2014-07-16 ENCOUNTER — Ambulatory Visit (HOSPITAL_COMMUNITY): Payer: Self-pay | Admitting: Psychiatry

## 2014-07-30 ENCOUNTER — Emergency Department (HOSPITAL_COMMUNITY)
Admission: EM | Admit: 2014-07-30 | Discharge: 2014-07-30 | Disposition: A | Discharge status: Home / Self Care | Payer: Medicare Other | Attending: Emergency Medicine | Admitting: Emergency Medicine

## 2014-07-30 ENCOUNTER — Encounter (HOSPITAL_COMMUNITY): Payer: Self-pay | Admitting: Emergency Medicine

## 2014-07-30 DIAGNOSIS — Y9389 Activity, other specified: Secondary | ICD-10-CM | POA: Diagnosis not present

## 2014-07-30 DIAGNOSIS — Z8669 Personal history of other diseases of the nervous system and sense organs: Secondary | ICD-10-CM | POA: Insufficient documentation

## 2014-07-30 DIAGNOSIS — Z720 Tobacco use: Secondary | ICD-10-CM | POA: Diagnosis not present

## 2014-07-30 DIAGNOSIS — S39012A Strain of muscle, fascia and tendon of lower back, initial encounter: Secondary | ICD-10-CM | POA: Insufficient documentation

## 2014-07-30 DIAGNOSIS — Z853 Personal history of malignant neoplasm of breast: Secondary | ICD-10-CM | POA: Insufficient documentation

## 2014-07-30 DIAGNOSIS — F329 Major depressive disorder, single episode, unspecified: Secondary | ICD-10-CM | POA: Diagnosis not present

## 2014-07-30 DIAGNOSIS — R51 Headache: Secondary | ICD-10-CM | POA: Diagnosis not present

## 2014-07-30 DIAGNOSIS — Z8639 Personal history of other endocrine, nutritional and metabolic disease: Secondary | ICD-10-CM | POA: Diagnosis not present

## 2014-07-30 DIAGNOSIS — Y998 Other external cause status: Secondary | ICD-10-CM | POA: Diagnosis not present

## 2014-07-30 DIAGNOSIS — X58XXXA Exposure to other specified factors, initial encounter: Secondary | ICD-10-CM | POA: Diagnosis not present

## 2014-07-30 DIAGNOSIS — Y9289 Other specified places as the place of occurrence of the external cause: Secondary | ICD-10-CM | POA: Insufficient documentation

## 2014-07-30 DIAGNOSIS — M545 Low back pain: Secondary | ICD-10-CM | POA: Diagnosis present

## 2014-07-30 DIAGNOSIS — J449 Chronic obstructive pulmonary disease, unspecified: Secondary | ICD-10-CM | POA: Insufficient documentation

## 2014-07-30 DIAGNOSIS — Z79899 Other long term (current) drug therapy: Secondary | ICD-10-CM | POA: Diagnosis not present

## 2014-07-30 LAB — URINALYSIS, ROUTINE W REFLEX MICROSCOPIC
Bilirubin Urine: NEGATIVE
Glucose, UA: NEGATIVE mg/dL
HGB URINE DIPSTICK: NEGATIVE
Ketones, ur: NEGATIVE mg/dL
Leukocytes, UA: NEGATIVE
Nitrite: NEGATIVE
PROTEIN: NEGATIVE mg/dL
Specific Gravity, Urine: 1.01 (ref 1.005–1.030)
UROBILINOGEN UA: 0.2 mg/dL (ref 0.0–1.0)
pH: 6.5 (ref 5.0–8.0)

## 2014-07-30 MED ORDER — ACETAMINOPHEN-CODEINE #3 300-30 MG PO TABS
ORAL_TABLET | ORAL | Status: AC
  Filled 2014-07-30: qty 6

## 2014-07-30 MED ORDER — ACETAMINOPHEN-CODEINE #3 300-30 MG PO TABS: 1.0000 | ORAL_TABLET | Freq: Four times a day (QID) | ORAL | Status: DC | PRN

## 2014-07-30 MED ORDER — HYDROCODONE-ACETAMINOPHEN 5-325 MG PO TABS: 1.0000 | ORAL_TABLET | ORAL | Status: DC | PRN

## 2014-07-30 MED ORDER — PROMETHAZINE HCL 12.5 MG PO TABS
ORAL_TABLET | ORAL | Status: AC
  Filled 2014-07-30: qty 6

## 2014-07-30 MED ORDER — PROMETHAZINE HCL 12.5 MG PO TABS: 12.5000 mg | ORAL_TABLET | Freq: Four times a day (QID) | ORAL | Status: DC | PRN

## 2014-07-30 MED ORDER — BACLOFEN 10 MG PO TABS: 10.0000 mg | ORAL_TABLET | Freq: Three times a day (TID) | ORAL | Status: AC

## 2014-07-30 MED ORDER — ONDANSETRON 4 MG PREPACK (~~LOC~~): 1.0000 | ORAL_TABLET | Freq: Four times a day (QID) | ORAL | Status: DC | PRN

## 2014-07-30 NOTE — Discharge Instructions (Signed)

## 2014-07-30 NOTE — ED Provider Notes (Signed)
CSN: [id number]     Arrival date & time 07/30/14  0927 History   First MD Initiated Contact with Patient 07/30/14 [phone]     Chief Complaint  Patient presents with  . Back Pain     (Consider location/radiation/quality/duration/timing/severity/associated sxs/prior Treatment) Patient is a 54 y.o. female presenting with back pain. The history is provided by the patient.  Back Pain Location:  Lumbar spine Quality:  Aching and stiffness Pain severity:  Severe Pain is:  Same all the time Onset quality:  Sudden Duration:  1 day Timing:  Intermittent Progression:  Worsening Chronicity:  New Context: lifting heavy objects   Relieved by:  Nothing Worsened by:  Movement and standing Ineffective treatments:  None tried Associated symptoms: headaches   Associated symptoms: no abdominal pain, no bladder incontinence, no bowel incontinence, no chest pain, no dysuria and no perianal numbness   Risk factors: hx of cancer     Past Medical History  Diagnosis Date  . Breast cancer   . Neuropathy   . Osteoporosis   . Depression   . Breast cancer 04/21/2012    Stage II (T1c N1 M0) grade 3 triple negative breast cancer, right- sided with 1 of 5 positive nodes, status post FEC in a dose dense fashion for 6 cycles followed by radiation therapy by Dr. Sondra Come for her high-risk disease with her initial date of surgery in February 2008.   [name] GERD (gastroesophageal reflux disease) 12/18/2012  . COPD (chronic obstructive pulmonary disease)   . Headache    Past Surgical History  Procedure Laterality Date  . Tubal ligation    . Mastectomy      bilateral  . Esophagogastroduodenoscopy  03/10/2008    SLF: Normal esophagus without evidence of Barrett, mass, erosion  ulceration, or stricture  . Leocolonoscopy  03/10/2008    UXN:ATFTDD terminal ileum, approximately 10 cm visualized/Normal colon without evidence of polyps, mass, inflammatory changes, diverticula, or AVMs/Normal retroflexed view of the  rectum/Patchy erythema in the antrum without erosion or ulceration   Family History  Problem Relation Age of Onset  . Cancer Father    History  Substance Use Topics  . Smoking status: Current Every Day Smoker -- 1.00 packs/day    Types: Cigarettes  . Smokeless tobacco: Never Used  . Alcohol Use: 3.6 oz/week    6 Shots of liquor per week   OB History    Gravida Para Term Preterm AB TAB SAB Ectopic Multiple Living   2 2 2       2      Review of Systems  Constitutional: Negative for activity change.       All ROS Neg except as noted in HPI  HENT: Negative for nosebleeds.   Eyes: Negative for photophobia and discharge.  Respiratory: Negative for cough, shortness of breath and wheezing.   Cardiovascular: Negative for chest pain and palpitations.  Gastrointestinal: Negative for abdominal pain, blood in stool and bowel incontinence.  Genitourinary: Negative for bladder incontinence, dysuria, frequency and hematuria.  Musculoskeletal: Positive for back pain and arthralgias. Negative for neck pain.  Skin: Negative.   Neurological: Positive for headaches. Negative for dizziness, seizures and speech difficulty.  Psychiatric/Behavioral: Negative for hallucinations and confusion.       Depression      Allergies  Vicodin  Home Medications   Prior to Admission medications   Medication Sig Start Date End Date Taking? Authorizing Provider  clonazePAM (KLONOPIN) 1 MG tablet Take 1 tablet (1 mg total) by mouth 4 (  four) times daily. 07/12/14 07/12/15  Cloria Spring, MD  DULoxetine (CYMBALTA) 60 MG capsule Take 1 capsule (60 mg total) by mouth daily. 07/12/14 07/12/15  Cloria Spring, MD  omeprazole (PRILOSEC) 40 MG capsule Take 40 mg by mouth daily.    Historical Provider, MD  QUEtiapine (SEROQUEL) 50 MG tablet Take 1 tablet (50 mg total) by mouth at bedtime. 07/12/14 07/12/15  Cloria Spring, MD   BP 116/96 mmHg  Pulse 95  Temp(Src) 97.5 F (36.4 C) (Oral)  Resp 24  Ht 5\' 4"  (1.626 m)  Wt 170  lb (77.111 kg)  BMI 29.17 kg/m2  SpO2 95% Physical Exam  Constitutional: She is oriented to person, place, and time. She appears well-developed and well-nourished.  Non-toxic appearance.  HENT:  Head: Normocephalic.  Right Ear: Tympanic membrane and external ear normal.  Left Ear: Tympanic membrane and external ear normal.  Eyes: EOM and lids are normal. Pupils are equal, round, and reactive to light.  Neck: Normal range of motion. Neck supple. Carotid bruit is not present.  Cardiovascular: Normal rate, regular rhythm, normal heart sounds, intact distal pulses and normal pulses.   Pulmonary/Chest: No respiratory distress. She has rhonchi.  Abdominal: Soft. Bowel sounds are normal. There is no tenderness. There is no guarding.  Musculoskeletal:       Lumbar back: She exhibits decreased range of motion, tenderness and pain. She exhibits no deformity.       Back:  Lymphadenopathy:       Head (right side): No submandibular adenopathy present.       Head (left side): No submandibular adenopathy present.    She has no cervical adenopathy.  Neurological: She is alert and oriented to person, place, and time. She has normal strength. No cranial nerve deficit or sensory deficit.  Skin: Skin is warm and dry.  Psychiatric: She has a normal mood and affect. Her speech is normal.  Nursing note and vitals reviewed.   ED Course  Pt states she can not fill a Rx for another week. Prepack given for norco and zofran.  Procedures (including critical care time) Labs Review Labs Reviewed - No data to display  Imaging Review No results found.   EKG Interpretation None      MDM  Patient was moving furniture on yesterday March 24 when she injured her lower back. Examination today is consistent with muscle strain. No gross neurologic deficits appreciated. The patient is concerned for possible urinary tract infection. She denies any recent fever, nausea, burning with urination, she does noted some  increased urine frequency.    Final diagnoses:  None    *I have reviewed nursing notes, vital signs, and all appropriate lab and imaging results for this patient.**    Lily Kocher, PA-C 07/31/14 2037  Daleen Bo, MD 08/02/14 [phone]

## 2014-07-30 NOTE — ED Notes (Signed)
PA at bedside.

## 2014-07-30 NOTE — ED Notes (Signed)
Patient c/o lower back pain that started yesterday. Per patient moved a couch yesterday. Patient reports taking aleve this morning at 5am with no relief. CNS intact. Denies any problems with urination or BMs.

## 2014-07-30 NOTE — ED Notes (Signed)
Pt order 6 pack of Zofran and 6 pack of Norco at discharge per Lily Kocher, PA.  And pt given prescriptions as noted in chart.

## 2014-08-01 LAB — URINE CULTURE: Colony Count: >=100000

## 2014-08-09 ENCOUNTER — Ambulatory Visit (HOSPITAL_COMMUNITY): Payer: Medicare Other | Admitting: Psychiatry

## 2014-08-09 MED FILL — Ondansetron HCl Tab 4 MG: ORAL | Qty: 4 | Status: AC

## 2014-08-09 MED FILL — Hydrocodone-Acetaminophen Tab 5-325 MG: ORAL | Qty: 6 | Status: AC

## 2014-08-18 ENCOUNTER — Ambulatory Visit (INDEPENDENT_AMBULATORY_CARE_PROVIDER_SITE_OTHER): Payer: Medicare Other | Admitting: Psychology

## 2014-08-18 ENCOUNTER — Encounter (HOSPITAL_COMMUNITY): Payer: Self-pay | Admitting: Psychology

## 2014-08-18 DIAGNOSIS — F332 Major depressive disorder, recurrent severe without psychotic features: Secondary | ICD-10-CM | POA: Diagnosis not present

## 2014-08-18 NOTE — Progress Notes (Signed)
asdfasdf

## 2014-08-19 ENCOUNTER — Ambulatory Visit (INDEPENDENT_AMBULATORY_CARE_PROVIDER_SITE_OTHER): Payer: Medicare Other | Admitting: Psychiatry

## 2014-08-19 ENCOUNTER — Encounter (HOSPITAL_COMMUNITY): Payer: Self-pay | Admitting: Psychiatry

## 2014-08-19 VITALS — BP 110/60 | HR 88 | Ht 64.0 in | Wt 181.0 lb

## 2014-08-19 DIAGNOSIS — F4310 Post-traumatic stress disorder, unspecified: Secondary | ICD-10-CM | POA: Diagnosis not present

## 2014-08-19 DIAGNOSIS — F332 Major depressive disorder, recurrent severe without psychotic features: Secondary | ICD-10-CM

## 2014-08-19 DIAGNOSIS — F411 Generalized anxiety disorder: Secondary | ICD-10-CM
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
## 2014-09-20 ENCOUNTER — Encounter (HOSPITAL_COMMUNITY): Payer: Self-pay | Admitting: Psychology

## 2014-09-20 ENCOUNTER — Ambulatory Visit (INDEPENDENT_AMBULATORY_CARE_PROVIDER_SITE_OTHER): Payer: Medicare Other | Admitting: Psychology

## 2014-09-20 DIAGNOSIS — F332 Major depressive disorder, recurrent severe without psychotic features: Secondary | ICD-10-CM | POA: Diagnosis not present

## 2014-09-20 NOTE — Progress Notes (Signed)
  Patient reports that she has been eating a little better and exercising (walking).  She has gone to [facility] to visit a friend.  She reports that her mood has continued to improve and responding well to medications.  The patient reports that she is working on Radiographer, therapeutic, etc.

## 2014-10-14 ENCOUNTER — Encounter (HOSPITAL_COMMUNITY): Payer: Self-pay | Admitting: Psychology

## 2014-10-14 ENCOUNTER — Ambulatory Visit (INDEPENDENT_AMBULATORY_CARE_PROVIDER_SITE_OTHER): Payer: Medicare Other | Admitting: Psychology

## 2014-10-14 DIAGNOSIS — F332 Major depressive disorder, recurrent severe without psychotic features: Secondary | ICD-10-CM | POA: Diagnosis not present

## 2014-10-14 NOTE — Progress Notes (Signed)
  The patient comes in today and reports that she has not been doing all that well. The patient reports that she has been isolating herself again and while there is been a reduction in anxiety the medications have really helped he still struggling. We continued work on therapeutic interventions are in issues related to her anxiety and depressive symptoms recur the patient reports that one of the things that keeps her from doing more activities outside her house is the fact that she feels embarrassed by some of the surgical scars effects of her cancer treatment.

## 2014-10-18 ENCOUNTER — Encounter (HOSPITAL_COMMUNITY): Payer: Self-pay | Admitting: Psychology

## 2014-10-18 NOTE — Progress Notes (Signed)
  We continue to work on issues related to her anxiety and depression. The patient has had a lot of difficulty around these issues.

## 2014-10-19 ENCOUNTER — Ambulatory Visit (INDEPENDENT_AMBULATORY_CARE_PROVIDER_SITE_OTHER): Payer: Medicare Other | Admitting: Psychiatry

## 2014-10-19 ENCOUNTER — Encounter (HOSPITAL_COMMUNITY): Payer: Self-pay | Admitting: Psychiatry

## 2014-10-19 VITALS — Ht 64.0 in

## 2014-10-19 DIAGNOSIS — F411 Generalized anxiety disorder: Secondary | ICD-10-CM

## 2014-10-19 DIAGNOSIS — F4310 Post-traumatic stress disorder, unspecified: Secondary | ICD-10-CM | POA: Diagnosis not present

## 2014-10-19 DIAGNOSIS — F332 Major depressive disorder, recurrent severe without psychotic features: Secondary | ICD-10-CM | POA: Diagnosis not present

## 2014-10-19 MED ORDER — CLONAZEPAM 1 MG PO TABS: 1.0000 mg | ORAL_TABLET | Freq: Four times a day (QID) | ORAL | Status: DC

## 2014-10-19 MED ORDER — QUETIAPINE FUMARATE 50 MG PO TABS: 50.0000 mg | ORAL_TABLET | Freq: Every day | ORAL | Status: DC

## 2014-10-19 MED ORDER — DULOXETINE HCL 60 MG PO CPEP: 60.0000 mg | ORAL_CAPSULE | Freq: Every day | ORAL | Status: DC
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
## 2014-11-05 ENCOUNTER — Ambulatory Visit (HOSPITAL_COMMUNITY): Payer: Self-pay | Admitting: Psychology

## 2014-11-17 ENCOUNTER — Ambulatory Visit (INDEPENDENT_AMBULATORY_CARE_PROVIDER_SITE_OTHER): Payer: Medicare Other | Admitting: Psychology

## 2014-11-17 DIAGNOSIS — F332 Major depressive disorder, recurrent severe without psychotic features: Secondary | ICD-10-CM | POA: Diagnosis not present

## 2014-11-19 NOTE — Progress Notes (Signed)
  The patient comes in today and reports that she has not been doing all that well. The patient reports that she has been isolating herself again and while there is been a reduction in anxiety the medications have really helped he still struggling. We continued work on therapeutic interventions are in issues related to her anxiety and depressive symptoms recur the patient reports that one of the things that keeps her from doing more activities outside her house is the fact that she feels embarrassed by some of the surgical scars effects of her cancer treatment.  11/17/2014

## 2014-12-05 ENCOUNTER — Emergency Department (HOSPITAL_COMMUNITY): Payer: Medicare Other

## 2014-12-05 ENCOUNTER — Emergency Department (HOSPITAL_COMMUNITY)
Admission: EM | Admit: 2014-12-05 | Discharge: 2014-12-05 | Disposition: A | Discharge status: Home / Self Care | Payer: Medicare Other | Attending: Emergency Medicine | Admitting: Emergency Medicine

## 2014-12-05 ENCOUNTER — Encounter (HOSPITAL_COMMUNITY): Payer: Self-pay | Admitting: Emergency Medicine

## 2014-12-05 DIAGNOSIS — Z79899 Other long term (current) drug therapy: Secondary | ICD-10-CM | POA: Diagnosis not present

## 2014-12-05 DIAGNOSIS — Y9389 Activity, other specified: Secondary | ICD-10-CM | POA: Insufficient documentation

## 2014-12-05 DIAGNOSIS — W01198A Fall on same level from slipping, tripping and stumbling with subsequent striking against other object, initial encounter: Secondary | ICD-10-CM | POA: Diagnosis not present

## 2014-12-05 DIAGNOSIS — Z720 Tobacco use: Secondary | ICD-10-CM | POA: Insufficient documentation

## 2014-12-05 DIAGNOSIS — J449 Chronic obstructive pulmonary disease, unspecified: Secondary | ICD-10-CM | POA: Insufficient documentation

## 2014-12-05 DIAGNOSIS — Y998 Other external cause status: Secondary | ICD-10-CM | POA: Insufficient documentation

## 2014-12-05 DIAGNOSIS — Z853 Personal history of malignant neoplasm of breast: Secondary | ICD-10-CM | POA: Insufficient documentation

## 2014-12-05 DIAGNOSIS — G629 Polyneuropathy, unspecified: Secondary | ICD-10-CM | POA: Diagnosis not present

## 2014-12-05 DIAGNOSIS — Z8739 Personal history of other diseases of the musculoskeletal system and connective tissue: Secondary | ICD-10-CM | POA: Diagnosis not present

## 2014-12-05 DIAGNOSIS — M25562 Pain in left knee: Secondary | ICD-10-CM

## 2014-12-05 DIAGNOSIS — Y9289 Other specified places as the place of occurrence of the external cause: Secondary | ICD-10-CM | POA: Diagnosis not present

## 2014-12-05 DIAGNOSIS — K219 Gastro-esophageal reflux disease without esophagitis: Secondary | ICD-10-CM | POA: Diagnosis not present

## 2014-12-05 DIAGNOSIS — F329 Major depressive disorder, single episode, unspecified: Secondary | ICD-10-CM | POA: Insufficient documentation

## 2014-12-05 DIAGNOSIS — S8992XA Unspecified injury of left lower leg, initial encounter: Secondary | ICD-10-CM | POA: Diagnosis present

## 2014-12-05 DIAGNOSIS — W19XXXA Unspecified fall, initial encounter: Secondary | ICD-10-CM

## 2014-12-05 DIAGNOSIS — S80212A Abrasion, left knee, initial encounter: Secondary | ICD-10-CM | POA: Diagnosis not present

## 2014-12-05 MED ORDER — HYDROCODONE-ACETAMINOPHEN 5-325 MG PO TABS: 1.0000 | ORAL_TABLET | Freq: Four times a day (QID) | ORAL | Status: DC | PRN

## 2014-12-05 MED ORDER — ONDANSETRON 4 MG PO TBDP: 4.0000 mg | ORAL_TABLET | Freq: Three times a day (TID) | ORAL | Status: DC | PRN

## 2014-12-05 MED ORDER — MORPHINE SULFATE 4 MG/ML IJ SOLN
4.0000 mg | Freq: Once | INTRAMUSCULAR | Status: AC
  Administered 2014-12-05: 4 mg via INTRAMUSCULAR
  Filled 2014-12-05: qty 1

## 2014-12-05 MED ORDER — KETOROLAC TROMETHAMINE 30 MG/ML IJ SOLN
30.0000 mg | Freq: Once | INTRAMUSCULAR | Status: AC
  Administered 2014-12-05: 30 mg via INTRAMUSCULAR
  Filled 2014-12-05: qty 1

## 2014-12-05 MED ORDER — IBUPROFEN 600 MG PO TABS: 600.0000 mg | ORAL_TABLET | Freq: Three times a day (TID) | ORAL | Status: AC

## 2014-12-05 NOTE — ED Notes (Signed)
Pt reports that she tripped on a curb last night and landed on her left knee. Pt states she applied ice but is unable to bear weight on left leg this AM.

## 2014-12-05 NOTE — Discharge Instructions (Signed)
As discussed, it is normal to feel worse in the days immediately following a fall regardless of medication use. ° °However, please take all medication as directed, use ice packs liberally.  If you develop any new, or concerning changes in your condition, please return here for further evaluation and management.   ° °Otherwise, please return followup with your physician ° ° ° °

## 2014-12-05 NOTE — ED Provider Notes (Signed)
CSN: [id number]     Arrival date & time 12/05/14  6712 History  This chart was scribed for Judy Muskrat, MD by Starleen Arms, ED Scribe. This patient was seen in room APA08/APA08 and the patient's care was started at 9:10 AM.   No chief complaint on file.  The history is provided by the patient. No language interpreter was used.   HPI Comments: [name] is a 54 y.o. female with a history of neuropathy after cancer who reports her leg became numb yesterday cuasing her to fall and hit her left knee on the curb.  The patient complains of pain in the left knee and left lower leg since and denies other injury at the time of fall including head, shoulder, neck.  Patient is unable to bear weight/ambulate without pain.  Patient has iced the complaint without pain relief.  She denies hip pain.   Past Medical History  Diagnosis Date  . Breast cancer   . Neuropathy   . Osteoporosis   . Depression   . Breast cancer 04/21/2012    Stage II (T1c N1 M0) grade 3 triple negative breast cancer, right- sided with 1 of 5 positive nodes, status post FEC in a dose dense fashion for 6 cycles followed by radiation therapy by Dr. Sondra Come for her high-risk disease with her initial date of surgery in February 2008.   [name] GERD (gastroesophageal reflux disease) 12/18/2012  . COPD (chronic obstructive pulmonary disease)   . Headache    Past Surgical History  Procedure Laterality Date  . Tubal ligation    . Mastectomy      bilateral  . Esophagogastroduodenoscopy  03/10/2008    SLF: Normal esophagus without evidence of Barrett, mass, erosion  ulceration, or stricture  . Leocolonoscopy  03/10/2008    [id number] terminal ileum, approximately 10 cm visualized/Normal colon without evidence of polyps, mass, inflammatory changes, diverticula, or AVMs/Normal retroflexed view of the rectum/Patchy erythema in the antrum without erosion or ulceration   Family History  Problem Relation Age of Onset  . Cancer Father     History  Substance Use Topics  . Smoking status: Current Every Day Smoker -- 1.00 packs/day    Types: Cigarettes  . Smokeless tobacco: Never Used  . Alcohol Use: 3.6 oz/week    6 Shots of liquor per week   OB History    Gravida Para Term Preterm AB TAB SAB Ectopic Multiple Living   2 2 2       2      Review of Systems  Constitutional:       Per HPI, otherwise negative  HENT:       Per HPI, otherwise negative  Respiratory:       Per HPI, otherwise negative  Cardiovascular:       Per HPI, otherwise negative  Gastrointestinal: Negative for vomiting.  Endocrine:       Negative aside from HPI  Genitourinary:       Neg aside from HPI   Musculoskeletal: Positive for arthralgias.       Per HPI, otherwise negative  Skin: Negative.   Neurological: Positive for weakness and numbness. Negative for syncope.      Allergies  Vicodin  Home Medications   Prior to Admission medications   Medication Sig Start Date End Date Taking? Authorizing Provider  acetaminophen-codeine (TYLENOL #3) 300-30 MG per tablet Take 1-2 tablets by mouth every 6 (six) hours as needed. 07/30/14   Lily Kocher, PA-C  clonazePAM (KLONOPIN) 1 MG  tablet Take 1 tablet (1 mg total) by mouth 4 (four) times daily. 10/19/14 10/19/15  Cloria Spring, MD  DULoxetine (CYMBALTA) 60 MG capsule Take 1 capsule (60 mg total) by mouth daily. 10/19/14 10/19/15  Cloria Spring, MD  HYDROcodone-acetaminophen (NORCO/VICODIN) 5-325 MG per tablet Take 1-2 tablets by mouth every 4 (four) hours as needed. 07/30/14   Lily Kocher, PA-C  omeprazole (PRILOSEC) 40 MG capsule Take 40 mg by mouth daily.    Historical Provider, MD  ondansetron (ZOFRAN) 4 mg TABS tablet Take 4 tablets by mouth every 6 (six) hours as needed. 07/30/14   Lily Kocher, PA-C  promethazine (PHENERGAN) 12.5 MG tablet Take 1 tablet (12.5 mg total) by mouth every 6 (six) hours as needed for nausea or vomiting. 07/30/14   Lily Kocher, PA-C  QUEtiapine (SEROQUEL) 50 MG  tablet Take 1 tablet (50 mg total) by mouth at bedtime. 10/19/14 10/19/15  Cloria Spring, MD   There were no vitals taken for this visit. Physical Exam  Constitutional: She is oriented to person, place, and time. She appears well-developed and well-nourished. No distress.  HENT:  Head: Normocephalic and atraumatic.  Eyes: Conjunctivae and EOM are normal.  Cardiovascular: Normal rate and regular rhythm.   Pulmonary/Chest: Effort normal and breath sounds normal. No stridor. No respiratory distress.  Abdominal: She exhibits no distension.  Musculoskeletal: She exhibits no edema.  ROM limited 180 by 160 secondary to pain about the knee.  Tender throughout the knee with effusion.  Pain suprapatellarly   Superficial abrasion on anterior aspect  Neurological: She is alert and oriented to person, place, and time. No cranial nerve deficit.  Skin: Skin is warm and dry.  Psychiatric: She has a normal mood and affect.  Nursing note and vitals reviewed.   ED Course  Procedures (including critical care time)  COORDINATION OF CARE:  9:15 AM Discussed treatment plan with patient at bedside.  Patient acknowledges and agrees with plan.    function of the quadriceps tendon is intact.    Imaging Review Dg Tibia/fibula Left  12/05/2014   CLINICAL DATA:  Fall last night, abrasions to left anterior patella and left lateral malleolus.  EXAM: LEFT TIBIA AND FIBULA - 2 VIEW  COMPARISON:  None.  FINDINGS: There is no evidence of fracture or other focal bone lesions. Soft tissues are unremarkable.  IMPRESSION: Negative.   Electronically Signed   By: Rolm Baptise M.D.   On: 12/05/2014 10:06   Dg Knee Complete 4 Views Left  12/05/2014   CLINICAL DATA:  Fall last night. Left anterior knee pain and abrasions. Initial encounter.  EXAM: LEFT KNEE - COMPLETE 4+ VIEW  COMPARISON:  None.  FINDINGS: There is no evidence of fracture, dislocation, or joint effusion. There is no evidence of arthropathy or other focal bone  abnormality. Soft tissues are unremarkable.  IMPRESSION: Negative.   Electronically Signed   By: Earle Gell M.D.   On: 12/05/2014 10:05    10:32 AM Pain improved  MDM    I personally performed the services described in this documentation, which was scribed in my presence. The recorded information has been reviewed and is accurate.   Patient presents one day after tripping on a curb, pain persistently in the knee. Here no evidence for fracture, and the patient has intact tendons throughout the left lower extremity. Given the patient's abrasion, pain, tenderness, mild effusion, she had Ace wrap, crutches, was discharged in stable condition with analgesia, to follow-up with orthopedics.  Judy Muskrat, MD 12/05/14 1034

## 2014-12-14 ENCOUNTER — Ambulatory Visit (INDEPENDENT_AMBULATORY_CARE_PROVIDER_SITE_OTHER): Payer: Medicare Other | Admitting: Orthopedic Surgery

## 2014-12-14 VITALS — BP 113/74 | Ht 64.0 in | Wt 181.0 lb

## 2014-12-14 DIAGNOSIS — S82109A Unspecified fracture of upper end of unspecified tibia, initial encounter for closed fracture: Secondary | ICD-10-CM

## 2014-12-14 DIAGNOSIS — S82409A Unspecified fracture of shaft of unspecified fibula, initial encounter for closed fracture: Secondary | ICD-10-CM

## 2014-12-14 DIAGNOSIS — S8290XA Unspecified fracture of unspecified lower leg, initial encounter for closed fracture: Secondary | ICD-10-CM | POA: Diagnosis not present

## 2014-12-14 DIAGNOSIS — S82839A Other fracture of upper and lower end of unspecified fibula, initial encounter for closed fracture: Secondary | ICD-10-CM

## 2014-12-14 DIAGNOSIS — S83512A Sprain of anterior cruciate ligament of left knee, initial encounter: Secondary | ICD-10-CM

## 2014-12-14 MED ORDER — HYDROCODONE-ACETAMINOPHEN 5-325 MG PO TABS: 1.0000 | ORAL_TABLET | Freq: Four times a day (QID) | ORAL | Status: DC | PRN

## 2014-12-14 MED ORDER — PROMETHAZINE HCL 12.5 MG PO TABS: 12.5000 mg | ORAL_TABLET | Freq: Four times a day (QID) | ORAL | Status: DC | PRN

## 2014-12-14 MED ORDER — IBUPROFEN 800 MG PO TABS: 800.0000 mg | ORAL_TABLET | Freq: Three times a day (TID) | ORAL | Status: DC | PRN

## 2014-12-14 NOTE — Patient Instructions (Addendum)
We will schedule MRI for you and call you with appointment  Two medications sent to your pharmacy

## 2014-12-14 NOTE — Progress Notes (Signed)
Patient ID: [name], female   DOB: [birth year], 54 y.o.   MRN: [id number]  Chief Complaint  Patient presents with  . Knee Pain    er follow up left knee pain, DOI 12/04/14 (FALL)    HPI [name] is a 54 y.o. female.  Presents after falling onto her flexed knee on July 30. She complains of pain and swelling. She went to the ER x-rays were negative. She was given crutches Ace bandage Norco and Phenergan for pain complains of anterior pain over the proximal tibia using and swelling of the entire leg joint effusion left knee and decreased range of motion. She is having painful weightbearing.  Review of systems abrasion of the skin over the left knee bruise of the skin of the left knee numbness and tingling are denied.    Review of Systems Review of Systems  Past Medical History  Diagnosis Date  . Breast cancer   . Neuropathy   . Osteoporosis   . Depression   . Breast cancer 04/21/2012    Stage II (T1c N1 M0) grade 3 triple negative breast cancer, right- sided with 1 of 5 positive nodes, status post FEC in a dose dense fashion for 6 cycles followed by radiation therapy by Dr. Sondra Come for her high-risk disease with her initial date of surgery in February 2008.   [name] GERD (gastroesophageal reflux disease) 12/18/2012  . COPD (chronic obstructive pulmonary disease)   . Headache     Past Surgical History  Procedure Laterality Date  . Tubal ligation    . Mastectomy      bilateral  . Esophagogastroduodenoscopy  03/10/2008    SLF: Normal esophagus without evidence of Barrett, mass, erosion  ulceration, or stricture  . Leocolonoscopy  03/10/2008    [id number]:[id number] terminal ileum, approximately 10 cm visualized/Normal colon without evidence of polyps, mass, inflammatory changes, diverticula, or AVMs/Normal retroflexed view of the rectum/Patchy erythema in the antrum without erosion or ulceration    Family History  Problem Relation Age of Onset  . Cancer Father     Social History History   Substance Use Topics  . Smoking status: Current Every Day Smoker -- 1.00 packs/day    Types: Cigarettes  . Smokeless tobacco: Never Used  . Alcohol Use: 3.6 oz/week    6 Shots of liquor per week    Allergies  Allergen Reactions  . Vicodin [Hydrocodone-Acetaminophen] Nausea Only    Current Outpatient Prescriptions  Medication Sig Dispense Refill  . clonazePAM (KLONOPIN) 1 MG tablet Take 1 tablet (1 mg total) by mouth 4 (four) times daily. 120 tablet 2  . omeprazole (PRILOSEC) 40 MG capsule Take 40 mg by mouth daily.    . ondansetron (ZOFRAN ODT) 4 MG disintegrating tablet Take 1 tablet (4 mg total) by mouth every 8 (eight) hours as needed for nausea or vomiting. 20 tablet 0  . QUEtiapine (SEROQUEL) 50 MG tablet Take 1 tablet (50 mg total) by mouth at bedtime. 30 tablet 2  . HYDROcodone-acetaminophen (NORCO/VICODIN) 5-325 MG per tablet Take 1 tablet by mouth every 6 (six) hours as needed for moderate pain. 56 tablet 0  . ibuprofen (ADVIL,MOTRIN) 800 MG tablet Take 1 tablet (800 mg total) by mouth every 8 (eight) hours as needed. 90 tablet 0  . promethazine (PHENERGAN) 12.5 MG tablet Take 1 tablet (12.5 mg total) by mouth every 6 (six) hours as needed for nausea or vomiting. 56 tablet 0   No current facility-administered medications for this visit.  Physical Exam Physical Exam Blood pressure 113/74, height 5\' 4"  (1.626 m), weight 181 lb (82.101 kg).  Body mass index is 31.05 kg/(m^2).  The patient is awake alert and oriented 3 mood and affect are normal her appearance is normal her body habitus is medium. She's a mature with crutches partial tiptoe weightbearing  Left knee joint effusion tenderness in the proximal tibia bruising of the skin in the proximal tibia and mid tibia sensation is normal vascularity is intact she does have peripheral edema knee feels stable anteriorly and posteriorly as well as collateral ligaments motor exam is normal  Data Reviewed The plain  films are negative  I aspirated the knee and got back 30 mL of blood she will need further imaging to diagnose the problem  Assessment    Encounter Diagnoses  Name Primary?  . Sprain, knee, anterior cruciate ligament, left, initial encounter Yes  . Fracture, tibia and fibula, proximal, unspecified laterality, closed, initial encounter     Of proximal tibia fracture versus cruciate ligament injury based on the amount of blood in the joint proliferative recommend MRI to evaluate further    Plan    MRI left knee  Meds ordered this encounter  Medications  . promethazine (PHENERGAN) 12.5 MG tablet    Sig: Take 1 tablet (12.5 mg total) by mouth every 6 (six) hours as needed for nausea or vomiting.    Dispense:  56 tablet    Refill:  0  . HYDROcodone-acetaminophen (NORCO/VICODIN) 5-325 MG per tablet    Sig: Take 1 tablet by mouth every 6 (six) hours as needed for moderate pain.    Dispense:  56 tablet    Refill:  0  . ibuprofen (ADVIL,MOTRIN) 800 MG tablet    Sig: Take 1 tablet (800 mg total) by mouth every 8 (eight) hours as needed.    Dispense:  90 tablet    Refill:  0          Arther Abbott 12/14/2014, 11:56 AM

## 2014-12-28 ENCOUNTER — Ambulatory Visit (HOSPITAL_COMMUNITY)
Admission: RE | Admit: 2014-12-28 | Discharge: 2014-12-28 | Disposition: A | Discharge status: Home / Self Care | Payer: Medicare Other | Source: Ambulatory Visit | Attending: Orthopedic Surgery | Admitting: Orthopedic Surgery

## 2014-12-28 DIAGNOSIS — S83512A Sprain of anterior cruciate ligament of left knee, initial encounter: Secondary | ICD-10-CM

## 2014-12-28 DIAGNOSIS — W19XXXA Unspecified fall, initial encounter: Secondary | ICD-10-CM | POA: Insufficient documentation

## 2014-12-28 DIAGNOSIS — S82102A Unspecified fracture of upper end of left tibia, initial encounter for closed fracture: Secondary | ICD-10-CM | POA: Diagnosis not present

## 2014-12-28 DIAGNOSIS — M25562 Pain in left knee: Secondary | ICD-10-CM | POA: Diagnosis not present

## 2014-12-29 ENCOUNTER — Ambulatory Visit (HOSPITAL_COMMUNITY): Payer: Self-pay | Admitting: Hematology & Oncology

## 2014-12-29 ENCOUNTER — Telehealth: Payer: Self-pay | Admitting: Orthopedic Surgery

## 2014-12-29 ENCOUNTER — Other Ambulatory Visit (HOSPITAL_COMMUNITY): Payer: Self-pay

## 2014-12-29 NOTE — Telephone Encounter (Addendum)
Patient called for results of MRI, done yesterday, 12/28/14, at [facility]; her ph# is [phone].  (Patient had been scheduled for fol/up appointment for results and for re-check, on 01/11/15).  Please advise.

## 2014-12-30 ENCOUNTER — Other Ambulatory Visit (HOSPITAL_COMMUNITY): Payer: Self-pay | Admitting: Psychiatry

## 2014-12-31 ENCOUNTER — Other Ambulatory Visit: Payer: Self-pay | Admitting: *Deleted

## 2014-12-31 MED ORDER — HYDROCODONE-ACETAMINOPHEN 5-325 MG PO TABS: 1.0000 | ORAL_TABLET | Freq: Four times a day (QID) | ORAL | Status: DC | PRN

## 2015-01-03 ENCOUNTER — Other Ambulatory Visit: Payer: Self-pay | Admitting: *Deleted

## 2015-01-03 ENCOUNTER — Telehealth: Payer: Self-pay | Admitting: Orthopedic Surgery

## 2015-01-03 MED ORDER — PROMETHAZINE HCL 12.5 MG PO TABS: 12.5000 mg | ORAL_TABLET | Freq: Four times a day (QID) | ORAL | Status: DC | PRN

## 2015-01-03 MED ORDER — IBUPROFEN 800 MG PO TABS: 800.0000 mg | ORAL_TABLET | Freq: Three times a day (TID) | ORAL | Status: DC | PRN

## 2015-01-03 NOTE — Telephone Encounter (Signed)
Called patient, no answer 

## 2015-01-03 NOTE — Telephone Encounter (Signed)
Patient is calling asking for a refill on medications HYDROcodone-acetaminophen (NORCO/VICODIN) 5-325 MG per tablet, ibuprofen (ADVIL,MOTRIN) 800 MG tablet  Andpromethazine (PHENERGAN) 12.5 MG tablet please advise?

## 2015-01-03 NOTE — Telephone Encounter (Signed)
Needs brace and medicine tomorrow at 11   Add on

## 2015-01-03 NOTE — Telephone Encounter (Signed)
Prescription available, called patient, no answer 

## 2015-01-04 ENCOUNTER — Ambulatory Visit (INDEPENDENT_AMBULATORY_CARE_PROVIDER_SITE_OTHER): Payer: Medicare Other | Admitting: Orthopedic Surgery

## 2015-01-04 VITALS — BP 130/79 | Ht 64.0 in | Wt 181.0 lb

## 2015-01-04 DIAGNOSIS — S82839A Other fracture of upper and lower end of unspecified fibula, initial encounter for closed fracture: Principal | ICD-10-CM

## 2015-01-04 DIAGNOSIS — S8290XA Unspecified fracture of unspecified lower leg, initial encounter for closed fracture: Secondary | ICD-10-CM

## 2015-01-04 DIAGNOSIS — S82409A Unspecified fracture of shaft of unspecified fibula, initial encounter for closed fracture: Secondary | ICD-10-CM

## 2015-01-04 DIAGNOSIS — S82109A Unspecified fracture of upper end of unspecified tibia, initial encounter for closed fracture: Secondary | ICD-10-CM

## 2015-01-04 MED ORDER — HYDROCODONE-ACETAMINOPHEN 5-325 MG PO TABS: 1.0000 | ORAL_TABLET | Freq: Four times a day (QID) | ORAL | Status: DC | PRN

## 2015-01-04 NOTE — Telephone Encounter (Signed)
Routing to Carol

## 2015-01-04 NOTE — Telephone Encounter (Signed)
Spoke w/Patient; aware of appointment 01/04/15

## 2015-01-04 NOTE — Progress Notes (Signed)
Chief Complaint  Patient presents with  . Follow-up    follow up left knee, needs brace and medication    The patient's MRI showed a proximal tibia fracture with brought her in to get a brace and to get her medication refilled  She exhibits proximal tibial pain  System review no numbness or tingling  No compartment syndrome. BP 130/79 mmHg  Ht 5\' 4"  (1.626 m)  Wt 181 lb (82.101 kg)  BMI 31.05 kg/m2  LMP 07/09/2013  We also note swelling and tenderness in the proximal tibia neurovascular exams intact skin is normal knee is stable range of motion is limited by pain motor exam normal  Fracture proximal tibia on MRI  Brace follow-up one month  Refill medication  Meds ordered this encounter  Medications  . DULoxetine (CYMBALTA) 60 MG capsule    Sig: Take 60 mg by mouth daily.  [name] HYDROcodone-acetaminophen (NORCO/VICODIN) 5-325 MG per tablet    Sig: Take 1 tablet by mouth every 6 (six) hours as needed for moderate pain.    Dispense:  56 tablet    Refill:  0

## 2015-01-05 ENCOUNTER — Ambulatory Visit (INDEPENDENT_AMBULATORY_CARE_PROVIDER_SITE_OTHER): Payer: Medicare Other | Admitting: Psychology

## 2015-01-05 ENCOUNTER — Other Ambulatory Visit (HOSPITAL_COMMUNITY): Payer: Self-pay

## 2015-01-05 ENCOUNTER — Ambulatory Visit (HOSPITAL_COMMUNITY): Payer: Self-pay | Admitting: Hematology & Oncology

## 2015-01-05 ENCOUNTER — Encounter (HOSPITAL_COMMUNITY): Payer: Self-pay | Admitting: Psychology

## 2015-01-05 DIAGNOSIS — F332 Major depressive disorder, recurrent severe without psychotic features: Secondary | ICD-10-CM

## 2015-01-05 NOTE — Progress Notes (Signed)
  The patient comes in today and reports that she has been doing better over the past week or so.  The patient reports that she does continue to avoid most social situations but has been spending time with her Elenor Legato, Barbaraann Rondo and a cousin.  The patient fell and broke her leg and has just gotten to the stage of wearing a brace and helps her mobility.  01/05/2015

## 2015-01-11 ENCOUNTER — Ambulatory Visit: Payer: Medicare Other | Admitting: Orthopedic Surgery

## 2015-01-17 ENCOUNTER — Other Ambulatory Visit (HOSPITAL_COMMUNITY): Payer: Self-pay | Admitting: Psychiatry

## 2015-01-18 ENCOUNTER — Other Ambulatory Visit (HOSPITAL_COMMUNITY): Payer: Self-pay | Admitting: Oncology

## 2015-01-18 DIAGNOSIS — Z720 Tobacco use: Secondary | ICD-10-CM

## 2015-01-18 DIAGNOSIS — R058 Other specified cough: Secondary | ICD-10-CM

## 2015-01-18 DIAGNOSIS — K219 Gastro-esophageal reflux disease without esophagitis: Secondary | ICD-10-CM

## 2015-01-18 DIAGNOSIS — R05 Cough: Secondary | ICD-10-CM

## 2015-01-18 MED ORDER — OMEPRAZOLE 20 MG PO CPDR: 20.0000 mg | DELAYED_RELEASE_CAPSULE | Freq: Every day | ORAL | Status: DC

## 2015-01-19 ENCOUNTER — Ambulatory Visit (INDEPENDENT_AMBULATORY_CARE_PROVIDER_SITE_OTHER): Payer: Medicare Other | Admitting: Psychiatry

## 2015-01-19 ENCOUNTER — Encounter (HOSPITAL_COMMUNITY): Payer: Self-pay | Admitting: Psychiatry

## 2015-01-19 VITALS — BP 107/74 | HR 95 | Ht 64.0 in | Wt 178.6 lb

## 2015-01-19 DIAGNOSIS — Z0100 Encounter for examination of eyes and vision without abnormal findings: Secondary | ICD-10-CM | POA: Diagnosis not present

## 2015-01-19 DIAGNOSIS — F411 Generalized anxiety disorder: Secondary | ICD-10-CM | POA: Diagnosis not present

## 2015-01-19 DIAGNOSIS — F332 Major depressive disorder, recurrent severe without psychotic features: Secondary | ICD-10-CM | POA: Diagnosis not present

## 2015-01-19 DIAGNOSIS — H524 Presbyopia: Secondary | ICD-10-CM | POA: Diagnosis not present

## 2015-01-19 DIAGNOSIS — F4310 Post-traumatic stress disorder, unspecified: Secondary | ICD-10-CM

## 2015-01-19 DIAGNOSIS — H5210 Myopia, unspecified eye: Secondary | ICD-10-CM | POA: Diagnosis not present

## 2015-01-19 MED ORDER — DULOXETINE HCL 60 MG PO CPEP: 60.0000 mg | ORAL_CAPSULE | Freq: Every day | ORAL | Status: DC

## 2015-01-19 MED ORDER — CLONAZEPAM 1 MG PO TABS: 1.0000 mg | ORAL_TABLET | Freq: Four times a day (QID) | ORAL | Status: DC

## 2015-01-19 MED ORDER — QUETIAPINE FUMARATE 100 MG PO TABS: 100.0000 mg | ORAL_TABLET | Freq: Every day | ORAL | Status: DC
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
## 2015-01-26 ENCOUNTER — Other Ambulatory Visit (HOSPITAL_COMMUNITY): Payer: Self-pay

## 2015-01-26 ENCOUNTER — Ambulatory Visit (HOSPITAL_COMMUNITY): Payer: Self-pay | Admitting: Hematology & Oncology

## 2015-01-31 ENCOUNTER — Other Ambulatory Visit: Payer: Self-pay | Admitting: *Deleted

## 2015-01-31 ENCOUNTER — Telehealth: Payer: Self-pay | Admitting: Orthopedic Surgery

## 2015-01-31 MED ORDER — PROMETHAZINE HCL 12.5 MG PO TABS: 12.5000 mg | ORAL_TABLET | Freq: Four times a day (QID) | ORAL | Status: DC | PRN

## 2015-01-31 MED ORDER — HYDROCODONE-ACETAMINOPHEN 5-325 MG PO TABS: 1.0000 | ORAL_TABLET | Freq: Four times a day (QID) | ORAL | Status: DC | PRN

## 2015-02-01 NOTE — Telephone Encounter (Signed)
Prescription available, patient aware  

## 2015-02-02 ENCOUNTER — Ambulatory Visit (HOSPITAL_COMMUNITY): Payer: Self-pay | Admitting: Psychology

## 2015-02-07 ENCOUNTER — Ambulatory Visit: Payer: Medicare Other | Admitting: Orthopedic Surgery

## 2015-02-10 ENCOUNTER — Other Ambulatory Visit (HOSPITAL_COMMUNITY): Payer: Self-pay

## 2015-02-10 DIAGNOSIS — C50919 Malignant neoplasm of unspecified site of unspecified female breast: Secondary | ICD-10-CM

## 2015-02-14 ENCOUNTER — Encounter (HOSPITAL_COMMUNITY): Payer: Self-pay | Admitting: Hematology & Oncology

## 2015-02-14 ENCOUNTER — Encounter (HOSPITAL_COMMUNITY): Payer: Medicare HMO | Attending: Hematology & Oncology | Admitting: Hematology & Oncology

## 2015-02-14 ENCOUNTER — Other Ambulatory Visit (HOSPITAL_COMMUNITY): Payer: Self-pay | Admitting: Psychiatry

## 2015-02-14 ENCOUNTER — Encounter (HOSPITAL_BASED_OUTPATIENT_CLINIC_OR_DEPARTMENT_OTHER): Payer: Medicare HMO

## 2015-02-14 VITALS — BP 121/63 | HR 80 | Temp 97.6°F | Resp 20 | Wt 182.6 lb

## 2015-02-14 DIAGNOSIS — C50919 Malignant neoplasm of unspecified site of unspecified female breast: Secondary | ICD-10-CM | POA: Diagnosis present

## 2015-02-14 DIAGNOSIS — J40 Bronchitis, not specified as acute or chronic: Secondary | ICD-10-CM

## 2015-02-14 DIAGNOSIS — Z0000 Encounter for general adult medical examination without abnormal findings: Secondary | ICD-10-CM

## 2015-02-14 DIAGNOSIS — Z853 Personal history of malignant neoplasm of breast: Secondary | ICD-10-CM

## 2015-02-14 DIAGNOSIS — K219 Gastro-esophageal reflux disease without esophagitis: Secondary | ICD-10-CM | POA: Diagnosis not present

## 2015-02-14 DIAGNOSIS — Z23 Encounter for immunization: Secondary | ICD-10-CM

## 2015-02-14 DIAGNOSIS — Z720 Tobacco use: Secondary | ICD-10-CM | POA: Diagnosis not present

## 2015-02-14 LAB — COMPREHENSIVE METABOLIC PANEL
ALBUMIN: 4.1 g/dL (ref 3.5–5.0)
ALK PHOS: 80 U/L (ref 38–126)
ALT: 20 U/L (ref 14–54)
AST: 24 U/L (ref 15–41)
Anion gap: 11 (ref 5–15)
BILIRUBIN TOTAL: 0.4 mg/dL (ref 0.3–1.2)
BUN: 13 mg/dL (ref 6–20)
CO2: 23 mmol/L (ref 22–32)
CREATININE: 0.56 mg/dL (ref 0.44–1.00)
Calcium: 8.6 mg/dL — ABNORMAL LOW (ref 8.9–10.3)
Chloride: 107 mmol/L (ref 101–111)
GFR calc Af Amer: >60 mL/min (ref >60)
GLUCOSE: 100 mg/dL — AB (ref 65–99)
POTASSIUM: 4 mmol/L (ref 3.5–5.1)
Sodium: 141 mmol/L (ref 135–145)
TOTAL PROTEIN: 7.5 g/dL (ref 6.5–8.1)

## 2015-02-14 LAB — CBC WITH DIFFERENTIAL/PLATELET
BASOS ABS: 0 10*3/uL (ref 0.0–0.1)
BASOS PCT: 1 %
Eosinophils Absolute: 0.3 10*3/uL (ref 0.0–0.7)
Eosinophils Relative: 3 %
HEMATOCRIT: 47.1 % — AB (ref 36.0–46.0)
HEMOGLOBIN: 15.6 g/dL — AB (ref 12.0–15.0)
LYMPHS PCT: 31 %
Lymphs Abs: 2.6 10*3/uL (ref 0.7–4.0)
MCH: 31.5 pg (ref 26.0–34.0)
MCHC: 33.1 g/dL (ref 30.0–36.0)
MCV: 95.2 fL (ref 78.0–100.0)
MONO ABS: 0.5 10*3/uL (ref 0.1–1.0)
Monocytes Relative: 6 %
NEUTROS ABS: 5 10*3/uL (ref 1.7–7.7)
NEUTROS PCT: 59 %
Platelets: 306 10*3/uL (ref 150–400)
RBC: 4.95 MIL/uL (ref 3.87–5.11)
RDW: 15.1 % (ref 11.5–15.5)
WBC: 8.4 10*3/uL (ref 4.0–10.5)

## 2015-02-14 MED ORDER — INFLUENZA VAC SPLIT QUAD 0.5 ML IM SUSY
0.5000 mL | PREFILLED_SYRINGE | Freq: Once | INTRAMUSCULAR | Status: AC
  Administered 2015-02-14: 0.5 mL via INTRAMUSCULAR
  Filled 2015-02-14: qty 0.5

## 2015-02-14 MED ORDER — ALBUTEROL SULFATE HFA 108 (90 BASE) MCG/ACT IN AERS: 2.0000 | INHALATION_SPRAY | Freq: Four times a day (QID) | RESPIRATORY_TRACT | Status: DC | PRN

## 2015-02-14 MED ORDER — AZITHROMYCIN 250 MG PO TABS: ORAL_TABLET | ORAL | Status: DC

## 2015-02-14 NOTE — Patient Instructions (Signed)
[place] at [facility] Discharge Instructions  RECOMMENDATIONS MADE BY THE CONSULTANT AND ANY TEST RESULTS WILL BE SENT TO YOUR REFERRING PHYSICIAN.  Exam and discussion by Dr. Whitney Muse. Flu Vaccine given Report any new lumps, bone pain, shortness of breath or other symptoms. Call with concerns  Follow-up in 1 year with labs and office visit.  Thank you for choosing [place] at [facility] to provide your oncology and hematology care.  To afford each patient quality time with our provider, please arrive at least 15 minutes before your scheduled appointment time.    You need to re-schedule your appointment should you arrive 10 or more minutes late.  We strive to give you quality time with our providers, and arriving late affects you and other patients whose appointments are after yours.  Also, if you no show three or more times for appointments you may be dismissed from the clinic at the providers discretion.     Again, thank you for choosing [facility].  Our hope is that these requests will decrease the amount of time that you wait before being seen by our physicians.       _____________________________________________________________  Should you have questions after your visit to [facility], please contact our office at [phone] between the hours of 8:30 a.m. and 4:30 p.m.  Voicemails left after 4:30 p.m. will not be returned until the following business day.  For prescription refill requests, have your pharmacy contact our office.

## 2015-02-14 NOTE — Progress Notes (Signed)
Judy Dura, [name] No primary provider on file.  Preventative health care - Plan: Influenza vac split quadrivalent PF (FLUARIX) injection 0.5 mL  CURRENT THERAPY: Observation  INTERVAL HISTORY: [name] 54 y.o. female returns for  regular  visit for followup of  Stage II (T1c N1 M0) grade 3 triple negative breast cancer, right- sided with 1 of 5 positive nodes, status post FEC in a dose dense fashion for 6 cycles followed by radiation therapy by Dr. Sondra Come for her high-risk disease with her initial date of surgery in February 2008.  From an oncology standpoint, she denies any complaints and ROS questioning is negative.   With this being said, she reports that about 1 month ago she developed a productive cough of yellow and green sputum.  She treated it symptomatically with OTC medications.  It has improved but she continues with a cough and sputum production.  The sputum is brown in color. She denies any fevers or shaking chills. Her current complaint is shortness of breath. This began about two months ago, it is exacerbated by warm and cold weather, exercise, and when she gets out of the shower. She has previously had bronchitis. She currently smokes 1/2 ppd, down from a previous amount of 1 ppd.   [name] is alone and here to discuss her history of breast cancer. She had a double mastectomy. No family history of breast cancer. Her breast reconstructions were completed by Dr. Luetta Nutting in [place].  She fractured the "t-bone" in her leg about two months ago. She follows with Dr. Aline Brochure for her leg. She reports that she has had swelling in her leg since fracturing it. Her neuropathy has worsened lately. She states it started after her chemotherapy. Her feet will go numb, even travelling up her legs. This numbness has caused her to fall.  Her last screening colonoscopy was performed "a while back".   Past Medical History  Diagnosis Date  . Breast cancer ([place])   . Neuropathy ([place])    . Osteoporosis   . Depression   . Breast cancer ([place]) 04/21/2012    Stage II (T1c N1 M0) grade 3 triple negative breast cancer, right- sided with 1 of 5 positive nodes, status post FEC in a dose dense fashion for 6 cycles followed by radiation therapy by Dr. Sondra Come for her high-risk disease with her initial date of surgery in February 2008.   [name] GERD (gastroesophageal reflux disease) 12/18/2012  . COPD (chronic obstructive pulmonary disease) ([place])   . Headache     has Breast cancer ([place]); Tobacco abuse; GERD (gastroesophageal reflux disease); Frozen shoulder syndrome; Pain in joint, shoulder region; Decreased range of motion of shoulder; and Major depression ([place]) on her problem list.     is allergic to vicodin.  Ms. Funches had no medications administered during this visit.  Past Surgical History  Procedure Laterality Date  . Tubal ligation    . Mastectomy      bilateral  . Esophagogastroduodenoscopy  03/10/2008    SLF: Normal esophagus without evidence of Barrett, mass, erosion  ulceration, or stricture  . Leocolonoscopy  03/10/2008    MBW:GYKZLD terminal ileum, approximately 10 cm visualized/Normal colon without evidence of polyps, mass, inflammatory changes, diverticula, or AVMs/Normal retroflexed view of the rectum/Patchy erythema in the antrum without erosion or ulceration    Denies any headaches, dizziness, double vision, fevers, chills, night sweats, nausea, vomiting, diarrhea, constipation, chest pain, heart palpitations, shortness of breath, blood in stool, black tarry stool,  urinary pain, urinary burning, urinary frequency, hematuria. Positive for shortness of breath.     Began about 2 months ago, worsened by extreme temperatures and exercise. Positive for leg swelling.     Attributed to tibia fracture sustained 2 months ago. Positive for neuropathy.     Worsening neuropathy, affects her hands, feet, travels up her legs.   PHYSICAL EXAMINATION  ECOG PERFORMANCE STATUS: 0  - Asymptomatic  Filed Vitals:   02/14/15 1019  BP: 121/63  Pulse: 80  Temp: 97.6 F (36.4 C)  Resp: 20    GENERAL:alert, no distress, well nourished, well developed, comfortable, cooperative and smiling SKIN: skin color, texture, turgor are normal, no rashes or significant lesions HEAD: Normocephalic, No masses, lesions, tenderness or abnormalities EYES: normal, PERRLA, EOMI, Conjunctiva are pink and non-injected EARS: External ears normal OROPHARYNX:mucous membranes are moist  NECK: supple, no adenopathy, thyroid normal size, non-tender, without nodularity, no stridor, non-tender, trachea midline LYMPH:  no palpable lymphadenopathy, no hepatosplenomegaly BREAST:bilateral implants, Latisimus dorsi flap reconstruction, no palpable abnormalities otherwise LUNGS: positive findings: Coarse rhonchi and wheezing throughout. Cough HEART: regular rate & rhythm, no murmurs, no gallops, S1 normal and S2 normal ABDOMEN:abdomen soft, non-tender, normal bowel sounds, no masses or organomegaly and no hepatosplenomegaly BACK: Back symmetric, no curvature., No CVA tenderness EXTREMITIES:less then 2 second capillary refill, no joint deformities, effusion, or inflammation, no edema, no skin discoloration, no clubbing, no cyanosis  NEURO: alert & oriented x 3 with fluent speech, no focal motor/sensory deficits, gait normal    ASSESSMENT:  1. Stage II (T1c N1 M0) grade 3 triple negative breast cancer, right- sided with 1 of 5 positive nodes, status post FEC in a dose dense fashion for 6 cycles followed by radiation therapy by Dr. Sondra Come for her high-risk disease with her initial date of surgery in February 2008. 2. Productive cough, bronchitis versus URI 3. GERD 4. Tobacco abuse, still smoking 1/2-1 ppd  Patient Active Problem List   Diagnosis Date Noted  . Major depression ([place]) 02/05/2014  . Pain in joint, shoulder region 07/23/2013  . Decreased range of motion of shoulder 07/23/2013  . Frozen  shoulder syndrome 07/15/2013  . Tobacco abuse 12/18/2012  . GERD (gastroesophageal reflux disease) 12/18/2012  . Breast cancer ([place]) 04/21/2012    PLAN:  1. Smoking cessation education provided 2. Patient education regarding self-breast exams 3. Rx for Omeprazole 20 mg daily x 1 year supply 4. Rx for Z-Pak, albuterol inhaler 5. Recommended OTC Delsym 6. Return in 1 year for follow-up   I discussed the importance of smoking cessation in regards to reducing cancer risk, vascular disease, and significant pulmonary disease. I treated her for bronchitis today. She has an appointment tomorrow with her primary care physician and I advised her to follow-up with them.  I am not sure that she has ever had genetic testing. She has undergone bilateral mastectomies and reconstruction. She was diagnosed at the age of 34 with triple negative disease. We will look into her genetic screening and refer her if it is not complete.  Oncologically, the patient is doing well.  We will see her back in 1 year for follow-up, sooner if needed.  All questions were answered. The patient knows to call the clinic with any problems, questions or concerns. We can certainly see the patient much sooner if necessary.  This document serves as a record of services personally performed by Ancil Linsey, [name]. It was created on her behalf by Arlyce Harman, a trained medical scribe.  The creation of this record is based on the scribe's personal observations and the provider's statements to them. This document has been checked and approved by the attending provider.  I have reviewed the above documentation for accuracy and completeness, and I agree with the above.  Kelby Fam. Whitney Muse, [name]

## 2015-02-14 NOTE — Progress Notes (Signed)
Ebony Yorio presents today for injection per MD orders. Flu vaccine administered INin left Deltoid. Administration without incident. Patient tolerated well.

## 2015-02-15 LAB — CANCER ANTIGEN 27.29: CA 27.29: 14.2 U/mL (ref 0.0–38.6)

## 2015-02-15 NOTE — Progress Notes (Signed)
Lab draw

## 2015-02-17 ENCOUNTER — Ambulatory Visit (INDEPENDENT_AMBULATORY_CARE_PROVIDER_SITE_OTHER): Payer: Self-pay | Admitting: Orthopedic Surgery

## 2015-02-17 VITALS — BP 115/76 | Ht 64.0 in | Wt 182.6 lb

## 2015-02-17 DIAGNOSIS — S82102D Unspecified fracture of upper end of left tibia, subsequent encounter for closed fracture with routine healing: Secondary | ICD-10-CM

## 2015-02-17 DIAGNOSIS — S82832D Other fracture of upper and lower end of left fibula, subsequent encounter for closed fracture with routine healing: Principal | ICD-10-CM

## 2015-02-17 DIAGNOSIS — S8292XD Unspecified fracture of left lower leg, subsequent encounter for closed fracture with routine healing: Secondary | ICD-10-CM

## 2015-02-17 MED ORDER — HYDROCODONE-ACETAMINOPHEN 5-325 MG PO TABS: 1.0000 | ORAL_TABLET | Freq: Four times a day (QID) | ORAL | Status: DC | PRN

## 2015-02-17 NOTE — Progress Notes (Signed)
Patient ID: [name], female   DOB: [birth year], 54 y.o.   MRN: [id number]  Follow up visit  Chief Complaint  Patient presents with  . Follow-up    1 month follow up left knee s/p brace + medication    BP 115/76 mmHg  Ht 5\' 4"  (1.626 m)  Wt 182 lb 9.6 oz (82.827 kg)  BMI 31.33 kg/m2  LMP 07/09/2013  No diagnosis found.  The patient follows up she had an MRI diagnosed proximal tibia fracture that looked really bad on MRI no evidence on plain film. She's had a hinged knee brace for 4 weeks she comes in for recheck. Her pain has improved or decreased although she still has some tenderness and pain in the proximal tibia. Knee flexion proximal to 90.  Continue bracing x-ray in 4 weeks weight-bear as tolerated  Meds ordered this encounter  Medications  . HYDROcodone-acetaminophen (NORCO/VICODIN) 5-325 MG tablet    Sig: Take 1 tablet by mouth every 6 (six) hours as needed for moderate pain.    Dispense:  56 tablet    Refill:  0

## 2015-03-07 ENCOUNTER — Telehealth (HOSPITAL_COMMUNITY): Payer: Self-pay | Admitting: *Deleted

## 2015-03-22 ENCOUNTER — Other Ambulatory Visit: Payer: Self-pay | Admitting: Orthopedic Surgery

## 2015-03-22 ENCOUNTER — Encounter: Payer: Self-pay | Admitting: Orthopedic Surgery

## 2015-03-22 ENCOUNTER — Ambulatory Visit (INDEPENDENT_AMBULATORY_CARE_PROVIDER_SITE_OTHER): Payer: Medicare HMO

## 2015-03-22 ENCOUNTER — Ambulatory Visit (INDEPENDENT_AMBULATORY_CARE_PROVIDER_SITE_OTHER): Payer: Medicare HMO | Admitting: Orthopedic Surgery

## 2015-03-22 VITALS — BP 110/80 | Ht 64.0 in | Wt 182.6 lb

## 2015-03-22 DIAGNOSIS — S82202D Unspecified fracture of shaft of left tibia, subsequent encounter for closed fracture with routine healing: Secondary | ICD-10-CM

## 2015-03-22 DIAGNOSIS — S82402D Unspecified fracture of shaft of left fibula, subsequent encounter for closed fracture with routine healing: Principal | ICD-10-CM

## 2015-03-22 DIAGNOSIS — M62838 Other muscle spasm: Secondary | ICD-10-CM | POA: Diagnosis not present

## 2015-03-22 DIAGNOSIS — S8292XD Unspecified fracture of left lower leg, subsequent encounter for closed fracture with routine healing: Secondary | ICD-10-CM

## 2015-03-22 MED ORDER — TIZANIDINE HCL 2 MG PO TABS: 2.0000 mg | ORAL_TABLET | Freq: Four times a day (QID) | ORAL | Status: DC | PRN

## 2015-03-22 NOTE — Addendum Note (Signed)
Addended by: Arther Abbott E on: 03/22/2015 03:10 PM   Modules accepted: Level of Service

## 2015-03-22 NOTE — Patient Instructions (Signed)
Take the brace off the knee.

## 2015-03-22 NOTE — Progress Notes (Addendum)
Chief Complaint  Patient presents with  . Follow-up    follow up + xray left knee, tib+fib fracture, DOI 12/04/14    X-rays today show fracture has healed patient has minimal complaints related to the fracture knee range of motion is within restored to normal  New problem chief complaint muscle spasms left thigh and calf   Ros: negative nerve, neg bowel , neg bowel   History 4-5 day history of spasms in the left leg, back pain is lower back left side ache in quality mild in severity worse with standing  Appearance is normal oriented 3 mood is normal ambulatory status is normal skin back and knee and leg intact neurovascular exam intact  Exam supple nontender Homans sign negative for range of motion knee knee stable, motor function normal  Knee no swelling   and hip however, tenderness in the lower back but no sensory or vascular deficits and straight leg raise is negative  BP 110/80 mmHg  Ht 5\' 4"  (1.626 m)  Wt 182 lb 9.6 oz (82.827 kg)  BMI 31.33 kg/m2  LMP 07/09/2013  Suspect lower back pain with radicular symptoms  Recommend muscle relaxer  Meds ordered this encounter  Medications  .       [name] tiZANidine (ZANAFLEX) 2 MG tablet    Sig: Take 1 tablet (2 mg total) by mouth every 6 (six) hours as needed for muscle spasms.    Dispense:  30 tablet    Refill:  0   Encounter Diagnoses  Name Primary?  . Tibia/fibula fracture, left, closed, with routine healing, subsequent encounter   . Muscle spasm of left lower extremity Yes

## 2015-03-26 ENCOUNTER — Encounter (HOSPITAL_COMMUNITY): Payer: Self-pay | Admitting: *Deleted

## 2015-03-26 ENCOUNTER — Other Ambulatory Visit (HOSPITAL_COMMUNITY): Payer: Self-pay | Admitting: Emergency Medicine

## 2015-03-26 ENCOUNTER — Emergency Department (HOSPITAL_COMMUNITY)
Admission: EM | Admit: 2015-03-26 | Discharge: 2015-03-26 | Disposition: A | Discharge status: Home / Self Care | Payer: Medicare HMO | Attending: Emergency Medicine | Admitting: Emergency Medicine

## 2015-03-26 DIAGNOSIS — Z8781 Personal history of (healed) traumatic fracture: Secondary | ICD-10-CM | POA: Diagnosis not present

## 2015-03-26 DIAGNOSIS — M7989 Other specified soft tissue disorders: Secondary | ICD-10-CM | POA: Diagnosis present

## 2015-03-26 DIAGNOSIS — Z8669 Personal history of other diseases of the nervous system and sense organs: Secondary | ICD-10-CM | POA: Diagnosis not present

## 2015-03-26 DIAGNOSIS — F4310 Post-traumatic stress disorder, unspecified: Secondary | ICD-10-CM | POA: Diagnosis not present

## 2015-03-26 DIAGNOSIS — K219 Gastro-esophageal reflux disease without esophagitis: Secondary | ICD-10-CM | POA: Diagnosis not present

## 2015-03-26 DIAGNOSIS — F17210 Nicotine dependence, cigarettes, uncomplicated: Secondary | ICD-10-CM | POA: Insufficient documentation

## 2015-03-26 DIAGNOSIS — M25562 Pain in left knee: Secondary | ICD-10-CM | POA: Insufficient documentation

## 2015-03-26 DIAGNOSIS — Z853 Personal history of malignant neoplasm of breast: Secondary | ICD-10-CM | POA: Insufficient documentation

## 2015-03-26 DIAGNOSIS — J449 Chronic obstructive pulmonary disease, unspecified: Secondary | ICD-10-CM | POA: Insufficient documentation

## 2015-03-26 DIAGNOSIS — F329 Major depressive disorder, single episode, unspecified: Secondary | ICD-10-CM | POA: Insufficient documentation

## 2015-03-26 DIAGNOSIS — M79605 Pain in left leg: Secondary | ICD-10-CM

## 2015-03-26 DIAGNOSIS — Z79899 Other long term (current) drug therapy: Secondary | ICD-10-CM | POA: Diagnosis not present

## 2015-03-26 HISTORY — DX: Post-traumatic stress disorder, unspecified: F43.10

## 2015-03-26 MED ORDER — HYDROCODONE-ACETAMINOPHEN 5-325 MG PO TABS: 1.0000 | ORAL_TABLET | ORAL | Status: DC | PRN

## 2015-03-26 NOTE — Discharge Instructions (Signed)
Edema °Edema is an abnormal buildup of fluids in your body tissues. Edema is somewhat dependent on gravity to pull the fluid to the lowest place in your body. That makes the condition more common in the legs and thighs (lower extremities). Painless swelling of the feet and ankles is common and becomes more likely as you get older. It is also common in looser tissues, like around your eyes.  °When the affected area is squeezed, the fluid may move out of that spot and leave a dent for a few moments. This dent is called pitting.  °CAUSES  °There are many possible causes of edema. Eating too much salt and being on your feet or sitting for a long time can cause edema in your legs and ankles. Hot weather may make edema worse. Common medical causes of edema include: °· Heart failure. °· Liver disease. °· Kidney disease. °· Weak blood vessels in your legs. °· Cancer. °· An injury. °· Pregnancy. °· Some medications. °· Obesity.  °SYMPTOMS  °Edema is usually painless. Your skin may look swollen or shiny.  °DIAGNOSIS  °Your health care provider may be able to diagnose edema by asking about your medical history and doing a physical exam. You may need to have tests such as X-rays, an electrocardiogram, or blood tests to check for medical conditions that may cause edema.  °TREATMENT  °Edema treatment depends on the cause. If you have heart, liver, or kidney disease, you need the treatment appropriate for these conditions. General treatment may include: °· Elevation of the affected body part above the level of your heart. °· Compression of the affected body part. Pressure from elastic bandages or support stockings squeezes the tissues and forces fluid back into the blood vessels. This keeps fluid from entering the tissues. °· Restriction of fluid and salt intake. °· Use of a water pill (diuretic). These medications are appropriate only for some types of edema. They pull fluid out of your body and make you urinate more often. This  gets rid of fluid and reduces swelling, but diuretics can have side effects. Only use diuretics as directed by your health care provider. °HOME CARE INSTRUCTIONS  °· Keep the affected body part above the level of your heart when you are lying down.   °· Do not sit still or stand for prolonged periods.   °· Do not put anything directly under your knees when lying down. °· Do not wear constricting clothing or garters on your upper legs.   °· Exercise your legs to work the fluid back into your blood vessels. This may help the swelling go down.   °· Wear elastic bandages or support stockings to reduce ankle swelling as directed by your health care provider.   °· Eat a low-salt diet to reduce fluid if your health care provider recommends it.   °· Only take medicines as directed by your health care provider.  °SEEK MEDICAL CARE IF:  °· Your edema is not responding to treatment. °· You have heart, liver, or kidney disease and notice symptoms of edema. °· You have edema in your legs that does not improve after elevating them.   °· You have sudden and unexplained weight gain. °SEEK IMMEDIATE MEDICAL CARE IF:  °· You develop shortness of breath or chest pain.   °· You cannot breathe when you lie down. °· You develop pain, redness, or warmth in the swollen areas.   °· You have heart, liver, or kidney disease and suddenly get edema. °· You have a fever and your symptoms suddenly get worse. °MAKE SURE YOU:  °·   Understand these instructions. °· Will watch your condition. °· Will get help right away if you are not doing well or get worse. °  °This information is not intended to replace advice given to you by your health care provider. Make sure you discuss any questions you have with your health care provider. °  °Document Released: 04/23/2005 Document Revised: 05/14/2014 Document Reviewed: 02/13/2013 °Elsevier Interactive Patient Education ©2016 [employer] ° °

## 2015-03-26 NOTE — ED Provider Notes (Signed)
CSN: [id number]     Arrival date & time 03/26/15  1057 History  By signing my name below, I, [facility], attest that this documentation has been prepared under the direction and in the presence of Virgel Manifold, MD. Electronically Signed: Virgel Bouquet, ED Scribe. 03/26/2015. 12:00 PM.    Chief Complaint  Patient presents with  . Leg Pain   The history is provided by the patient. No language interpreter was used.    HPI Comments: [name] is a 54 y.o. female with recent left tibia fx who presents to the Emergency Department complaining of moderate, cramping, throbbing, constant left leg pain that radiates from thigh to ankle onset 1 week ago. Patient reports that she recently had a left leg brace removed by her orthopaedist Dr. Koleen Nimrod and that he advised her to follow-up with her PCP for pain management. She also notes that he drew blood from her knee but did not follow-up on this information. She states that she is taking muscle relaxants and pain medication but these medications are no longer relieving her pain. Patient denies changes in chest pain or SOB and difficult ambulation.    Past Medical History  Diagnosis Date  . Breast cancer ([place])   . Neuropathy ([place])   . Osteoporosis   . Depression   . Breast cancer ([place]) 04/21/2012    Stage II (T1c N1 M0) grade 3 triple negative breast cancer, right- sided with 1 of 5 positive nodes, status post FEC in a dose dense fashion for 6 cycles followed by radiation therapy by Dr. Sondra Come for her high-risk disease with her initial date of surgery in February 2008.   [name] GERD (gastroesophageal reflux disease) 12/18/2012  . COPD (chronic obstructive pulmonary disease) ([place])   . Headache   . PTSD (post-traumatic stress disorder)    Past Surgical History  Procedure Laterality Date  . Tubal ligation    . Esophagogastroduodenoscopy  03/10/2008    SLF: Normal esophagus without evidence of Barrett, mass, erosion  ulceration, or  stricture  . Leocolonoscopy  03/10/2008    [id number] terminal ileum, approximately 10 cm visualized/Normal colon without evidence of polyps, mass, inflammatory changes, diverticula, or AVMs/Normal retroflexed view of the rectum/Patchy erythema in the antrum without erosion or ulceration  . Breast surgery    . Mastectomy      bilateral   Family History  Problem Relation Age of Onset  . Cancer Father    Social History  Substance Use Topics  . Smoking status: Current Every Day Smoker -- 1.00 packs/day    Types: Cigarettes  . Smokeless tobacco: Never Used  . Alcohol Use: 3.6 oz/week    6 Shots of liquor per week   OB History    Gravida Para Term Preterm AB TAB SAB Ectopic Multiple Living   2 2 2       2      Review of Systems A complete 10 system review of systems was obtained and all systems are negative except as noted in the HPI and PMH.     Allergies  Vicodin  Home Medications   Prior to Admission medications   Medication Sig Start Date End Date Taking? Authorizing Provider  albuterol (PROVENTIL HFA;VENTOLIN HFA) 108 (90 BASE) MCG/ACT inhaler Inhale 2 puffs into the lungs every 6 (six) hours as needed for wheezing or shortness of breath. 02/14/15  Yes Patrici Ranks, MD  clonazePAM (KLONOPIN) 1 MG tablet Take 1 tablet (1 mg total) by mouth 4 (four) times daily.  01/19/15 01/19/16 Yes Cloria Spring, MD  DULoxetine (CYMBALTA) 60 MG capsule Take 1 capsule (60 mg total) by mouth daily. 01/19/15  Yes Cloria Spring, MD  ibuprofen (ADVIL,MOTRIN) 800 MG tablet Take 1 tablet (800 mg total) by mouth every 8 (eight) hours as needed. Patient taking differently: Take 800 mg by mouth every 8 (eight) hours as needed for moderate pain.  01/03/15  Yes Carole Civil, MD  omeprazole (PRILOSEC) 20 MG capsule Take 1 capsule (20 mg total) by mouth daily. 01/18/15 02/04/16 Yes Manon Hilding Kefalas, PA-C  pravastatin (PRAVACHOL) 10 MG tablet Take 1 tablet by mouth every evening.  03/17/15  Yes  Historical Provider, MD  promethazine (PHENERGAN) 12.5 MG tablet Take 1 tablet (12.5 mg total) by mouth every 6 (six) hours as needed for nausea or vomiting. 01/31/15  Yes Carole Civil, MD  QUEtiapine (SEROQUEL) 100 MG tablet Take 1 tablet (100 mg total) by mouth at bedtime. 01/19/15 01/19/16 Yes Cloria Spring, MD  tiZANidine (ZANAFLEX) 2 MG tablet Take 1 tablet (2 mg total) by mouth every 6 (six) hours as needed for muscle spasms. 03/22/15  Yes Carole Civil, MD  HYDROcodone-acetaminophen (NORCO/VICODIN) 5-325 MG tablet Take 1 tablet by mouth every 6 (six) hours as needed for moderate pain. Patient not taking: Reported on 03/26/2015 02/17/15   Carole Civil, MD   BP 128/74 mmHg  Pulse 88  Temp(Src) 98.5 F (36.9 C) (Oral)  Resp 20  Ht 5\' 4"  (1.626 m)  Wt 172 lb (78.019 kg)  BMI 29.51 kg/m2  SpO2 94%  LMP 07/09/2013 Physical Exam  Constitutional: She is oriented to person, place, and time. She appears well-developed and well-nourished. No distress.  HENT:  Head: Normocephalic and atraumatic.  Eyes: Conjunctivae and EOM are normal.  Neck: Neck supple. No tracheal deviation present.  Cardiovascular: Normal rate.   Pulmonary/Chest: Effort normal. No respiratory distress.  Musculoskeletal: Normal range of motion.  Mild swelling of left lower extremity. Left calf tenderness. No palpable cords. Able to accurately range the left knee. No significant effusion. NVI distally.  Neurological: She is alert and oriented to person, place, and time.  Skin: Skin is warm and dry.  Psychiatric: She has a normal mood and affect. Her behavior is normal.  Nursing note and vitals reviewed.   ED Course  Procedures   DIAGNOSTIC STUDIES: Oxygen Saturation is 94% on RA, adequate by my interpretation.    COORDINATION OF CARE: 11:42 AM Advised patient to follow-up on Monday at [facility] for [place].  Will orderDiscussed treatment plan with pt at bedside and pt agreed to plan.   Labs  Review Labs Reviewed - No data to display  Imaging Review No results found. I have personally reviewed and evaluated these images and lab results as part of my medical decision-making.   EKG Interpretation None      MDM   Final diagnoses:  Left leg swelling    54 year old female with left lower exam knee pain and swelling. Somewhat recent orthopedic injury and decreased mobility. Some concern for possible DVT. Unfortunately cannot obtain ultrasound of this time. We'll have her return for imaging. No respiratory complaints. Suspicion is not high enough at this time to empirically treat for possible DVT.  I personally preformed the services scribed in my presence. The recorded information has been reviewed is accurate. Virgel Manifold, MD.    Virgel Manifold, MD 04/09/15 2119

## 2015-03-26 NOTE — ED Notes (Signed)
Pt states she had a recent left tibia tx and just had brace removed. States that when she went to [facility], she was having the same pain and was told to f/u with PCP, which she currently does not have one. States sharp, constant pain from left hip radiating down leg. States she is concerned because the other physician pulled blood from the knee previously. Pain has been constant x 7 days.

## 2015-03-26 NOTE — ED Notes (Signed)
Patient informed to be here tomorrow Sunday, November 20 at 9:00am for ultrasound

## 2015-03-27 ENCOUNTER — Ambulatory Visit (HOSPITAL_COMMUNITY)
Admission: RE | Admit: 2015-03-27 | Discharge: 2015-03-27 | Disposition: A | Discharge status: Home / Self Care | Payer: Medicare HMO | Source: Ambulatory Visit | Attending: Emergency Medicine | Admitting: Emergency Medicine

## 2015-03-27 ENCOUNTER — Inpatient Hospital Stay (HOSPITAL_COMMUNITY): Admission: RE | Admit: 2015-03-27 | Payer: Self-pay | Source: Ambulatory Visit

## 2015-03-27 DIAGNOSIS — M7989 Other specified soft tissue disorders: Secondary | ICD-10-CM | POA: Insufficient documentation

## 2015-03-27 DIAGNOSIS — M79605 Pain in left leg: Secondary | ICD-10-CM | POA: Insufficient documentation

## 2015-04-20 ENCOUNTER — Ambulatory Visit (HOSPITAL_COMMUNITY): Payer: Self-pay | Admitting: Psychiatry

## 2015-04-20 ENCOUNTER — Ambulatory Visit (INDEPENDENT_AMBULATORY_CARE_PROVIDER_SITE_OTHER): Payer: Medicare HMO | Admitting: Psychiatry

## 2015-04-20 ENCOUNTER — Encounter (HOSPITAL_COMMUNITY): Payer: Self-pay | Admitting: Psychiatry

## 2015-04-20 VITALS — Ht 64.0 in | Wt 176.0 lb

## 2015-04-20 DIAGNOSIS — F411 Generalized anxiety disorder: Secondary | ICD-10-CM | POA: Diagnosis not present

## 2015-04-20 DIAGNOSIS — F4310 Post-traumatic stress disorder, unspecified: Secondary | ICD-10-CM | POA: Diagnosis not present

## 2015-04-20 DIAGNOSIS — F332 Major depressive disorder, recurrent severe without psychotic features: Secondary | ICD-10-CM

## 2015-04-20 MED ORDER — CLONAZEPAM 1 MG PO TABS: 1.0000 mg | ORAL_TABLET | Freq: Four times a day (QID) | ORAL | Status: DC

## 2015-04-20 MED ORDER — DULOXETINE HCL 60 MG PO CPEP
60.0000 mg | ORAL_CAPSULE | Freq: Every day | ORAL | Status: DC
Start: 2015-04-20 — End: 2015-07-19

## 2015-04-20 MED ORDER — QUETIAPINE FUMARATE 100 MG PO TABS: 100.0000 mg | ORAL_TABLET | Freq: Every day | ORAL | Status: DC

## 2015-04-20 MED ORDER — DULOXETINE HCL 60 MG PO CPEP: 60.0000 mg | ORAL_CAPSULE | Freq: Every day | ORAL | Status: DC
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
## 2015-04-22 ENCOUNTER — Ambulatory Visit (HOSPITAL_COMMUNITY): Payer: Self-pay | Admitting: Psychology

## 2015-04-26 ENCOUNTER — Telehealth (HOSPITAL_COMMUNITY): Payer: Self-pay | Admitting: *Deleted

## 2015-04-26 NOTE — Telephone Encounter (Signed)
[facility] mail order called Olivia Mackie) stating that they would like a verbal refill request for pt medication. Per Olivia Mackie they received some of the request but not one medication. Per Olivia Mackie, they would like to see if Dr. Harrington Challenger office could refill pt Pravastatin. Informed Olivia Mackie that Pravastatin is a cholesterol medication and a psy doctor do not refill those types of medications. Per Olivia Mackie, she stated that pt requested that they send it to Dr. Harrington Challenger office to get it refilled. Per Olivia Mackie, she will put denial and call pt back to see if she could get this medication refilled by her PCP.

## 2015-05-16 ENCOUNTER — Emergency Department (HOSPITAL_COMMUNITY)
Admission: EM | Admit: 2015-05-16 | Discharge: 2015-05-16 | Disposition: A | Discharge status: Home / Self Care | Payer: Medicare HMO | Attending: Emergency Medicine | Admitting: Emergency Medicine

## 2015-05-16 ENCOUNTER — Emergency Department (HOSPITAL_COMMUNITY): Payer: Medicare HMO

## 2015-05-16 ENCOUNTER — Encounter (HOSPITAL_COMMUNITY): Payer: Self-pay

## 2015-05-16 DIAGNOSIS — R079 Chest pain, unspecified: Secondary | ICD-10-CM | POA: Diagnosis present

## 2015-05-16 DIAGNOSIS — Z9889 Other specified postprocedural states: Secondary | ICD-10-CM | POA: Insufficient documentation

## 2015-05-16 DIAGNOSIS — F4310 Post-traumatic stress disorder, unspecified: Secondary | ICD-10-CM | POA: Insufficient documentation

## 2015-05-16 DIAGNOSIS — Z8669 Personal history of other diseases of the nervous system and sense organs: Secondary | ICD-10-CM | POA: Insufficient documentation

## 2015-05-16 DIAGNOSIS — K219 Gastro-esophageal reflux disease without esophagitis: Secondary | ICD-10-CM | POA: Diagnosis not present

## 2015-05-16 DIAGNOSIS — F329 Major depressive disorder, single episode, unspecified: Secondary | ICD-10-CM | POA: Insufficient documentation

## 2015-05-16 DIAGNOSIS — Z853 Personal history of malignant neoplasm of breast: Secondary | ICD-10-CM | POA: Diagnosis not present

## 2015-05-16 DIAGNOSIS — F17210 Nicotine dependence, cigarettes, uncomplicated: Secondary | ICD-10-CM | POA: Insufficient documentation

## 2015-05-16 DIAGNOSIS — J449 Chronic obstructive pulmonary disease, unspecified: Secondary | ICD-10-CM

## 2015-05-16 DIAGNOSIS — Z79899 Other long term (current) drug therapy: Secondary | ICD-10-CM | POA: Insufficient documentation

## 2015-05-16 LAB — CBC WITH DIFFERENTIAL/PLATELET
BASOS ABS: 0.1 10*3/uL (ref 0.0–0.1)
BASOS PCT: 0 %
EOS ABS: 0.3 10*3/uL (ref 0.0–0.7)
Eosinophils Relative: 2 %
HEMATOCRIT: 45.2 % (ref 36.0–46.0)
HEMOGLOBIN: 15.1 g/dL — AB (ref 12.0–15.0)
Lymphocytes Relative: 33 %
Lymphs Abs: 3.7 10*3/uL (ref 0.7–4.0)
MCH: 31.9 pg (ref 26.0–34.0)
MCHC: 33.4 g/dL (ref 30.0–36.0)
MCV: 95.6 fL (ref 78.0–100.0)
MONOS PCT: 6 %
Monocytes Absolute: 0.6 10*3/uL (ref 0.1–1.0)
NEUTROS ABS: 6.5 10*3/uL (ref 1.7–7.7)
NEUTROS PCT: 59 %
Platelets: 341 10*3/uL (ref 150–400)
RBC: 4.73 MIL/uL (ref 3.87–5.11)
RDW: 13.4 % (ref 11.5–15.5)
WBC: 11.1 10*3/uL — ABNORMAL HIGH (ref 4.0–10.5)

## 2015-05-16 LAB — BASIC METABOLIC PANEL
ANION GAP: 11 (ref 5–15)
BUN: 6 mg/dL (ref 6–20)
CALCIUM: 9.3 mg/dL (ref 8.9–10.3)
CO2: 23 mmol/L (ref 22–32)
CREATININE: 0.56 mg/dL (ref 0.44–1.00)
Chloride: 104 mmol/L (ref 101–111)
Glucose, Bld: 112 mg/dL — ABNORMAL HIGH (ref 65–99)
Potassium: 3.9 mmol/L (ref 3.5–5.1)
SODIUM: 138 mmol/L (ref 135–145)

## 2015-05-16 LAB — D-DIMER, QUANTITATIVE (NOT AT ARMC)

## 2015-05-16 LAB — I-STAT TROPONIN, ED
TROPONIN I, POC: 0 ng/mL (ref 0.00–0.08)
TROPONIN I, POC: 0 ng/mL (ref 0.00–0.08)

## 2015-05-16 LAB — HCG, SERUM, QUALITATIVE: Preg, Serum: NEGATIVE

## 2015-05-16 MED ORDER — MORPHINE SULFATE (PF) 4 MG/ML IV SOLN
4.0000 mg | Freq: Once | INTRAVENOUS | Status: AC
  Administered 2015-05-16: 4 mg via INTRAVENOUS
  Filled 2015-05-16: qty 1

## 2015-05-16 MED ORDER — IBUPROFEN 400 MG PO TABS
600.0000 mg | ORAL_TABLET | Freq: Once | ORAL | Status: AC
  Administered 2015-05-16: 600 mg via ORAL
  Filled 2015-05-16: qty 2

## 2015-05-16 MED ORDER — IPRATROPIUM-ALBUTEROL 0.5-2.5 (3) MG/3ML IN SOLN
3.0000 mL | Freq: Once | RESPIRATORY_TRACT | Status: AC
  Administered 2015-05-16: 3 mL via RESPIRATORY_TRACT
  Filled 2015-05-16: qty 3

## 2015-05-16 NOTE — ED Notes (Signed)
Patient reports of chest pressure that started yesterday morning when she woke up. States she has had cough and pain is worse with cough. C/o tenderness to chest when palpated. NAD noted.

## 2015-05-16 NOTE — ED Notes (Signed)
Pt. Notified of labs and EKG to be done at 1800.

## 2015-05-16 NOTE — Discharge Instructions (Signed)
Please return without fail for worsening symptoms, including worsening pain, difficulty breathing, fevers, or any other symptoms concerning to you. You may continue to take your home Vicodin as well as ibuprofen as needed for pain control. For difficulty breathing related to your COPD, you can also take 4-6 puffs of albuterol as needed every 4-6 hours instead of just one puff per day.  Chest Wall Pain Chest wall pain is pain in or around the bones and muscles of your chest. Sometimes, an injury causes this pain. Sometimes, the cause may not be known. This pain may take several weeks or longer to get better. HOME CARE Pay attention to any changes in your symptoms. Take these actions to help with your pain:  Rest as told by your doctor.  Avoid activities that cause pain. Try not to use your chest, belly (abdominal), or side muscles to lift heavy things.  If directed, apply ice to the painful area:  Put ice in a plastic bag.  Place a towel between your skin and the bag.  Leave the ice on for 20 minutes, 2-3 times per day.  Take over-the-counter and prescription medicines only as told by your doctor.  Do not use tobacco products, including cigarettes, chewing tobacco, and e-cigarettes. If you need help quitting, ask your doctor.  Keep all follow-up visits as told by your doctor. This is important. GET HELP IF:  You have a fever.  Your chest pain gets worse.  You have new symptoms. GET HELP RIGHT AWAY IF:  You feel sick to your stomach (nauseous) or you throw up (vomit).  You feel sweaty or light-headed.  You have a cough with phlegm (sputum) or you cough up blood.  You are short of breath.   This information is not intended to replace advice given to you by your health care provider. Make sure you discuss any questions you have with your health care provider.   Document Released: 10/10/2007 Document Revised: 01/12/2015 Document Reviewed: 07/19/2014 Elsevier Interactive  Patient Education 2016 Else[employer]  Nonspecific Chest Pain  Chest pain can be caused by many different conditions. There is always a chance that your pain could be related to something serious, such as a heart attack or a blood clot in your lungs. Chest pain can also be caused by conditions that are not life-threatening. If you have chest pain, it is very important to follow up with your health care provider. CAUSES  Chest pain can be caused by:  Heartburn.  Pneumonia or bronchitis.  Anxiety or stress.  Inflammation around your heart (pericarditis) or lung (pleuritis or pleurisy).  A blood clot in your lung.  A collapsed lung (pneumothorax). It can develop suddenly on its own (spontaneous pneumothorax) or from trauma to the chest.  Shingles infection (varicella-zoster virus).  Heart attack.  Damage to the bones, muscles, and cartilage that make up your chest wall. This can include:  Bruised bones due to injury.  Strained muscles or cartilage due to frequent or repeated coughing or overwork.  Fracture to one or more ribs.  Sore cartilage due to inflammation (costochondritis). RISK FACTORS  Risk factors for chest pain may include:  Activities that increase your risk for trauma or injury to your chest.  Respiratory infections or conditions that cause frequent coughing.  Medical conditions or overeating that can cause heartburn.  Heart disease or family history of heart disease.  Conditions or health behaviors that increase your risk of developing a blood clot.  Having had chicken pox (  varicella zoster). SIGNS AND SYMPTOMS Chest pain can feel like:  Burning or tingling on the surface of your chest or deep in your chest.  Crushing, pressure, aching, or squeezing pain.  Dull or sharp pain that is worse when you move, cough, or take a deep breath.  Pain that is also felt in your back, neck, shoulder, or arm, or pain that spreads to any of these areas. Your chest  pain may come and go, or it may stay constant. DIAGNOSIS Lab tests or other studies may be needed to find the cause of your pain. Your health care provider may have you take a test called an ambulatory ECG (electrocardiogram). An ECG records your heartbeat patterns at the time the test is performed. You may also have other tests, such as:  Transthoracic echocardiogram (TTE). During echocardiography, sound waves are used to create a picture of all of the heart structures and to look at how blood flows through your heart.  Transesophageal echocardiogram (TEE).This is a more advanced imaging test that obtains images from inside your body. It allows your health care provider to see your heart in finer detail.  Cardiac monitoring. This allows your health care provider to monitor your heart rate and rhythm in real time.  Holter monitor. This is a portable device that records your heartbeat and can help to diagnose abnormal heartbeats. It allows your health care provider to track your heart activity for several days, if needed.  Stress tests. These can be done through exercise or by taking medicine that makes your heart beat more quickly.  Blood tests.  Imaging tests. TREATMENT  Your treatment depends on what is causing your chest pain. Treatment may include:  Medicines. These may include:  Acid blockers for heartburn.  Anti-inflammatory medicine.  Pain medicine for inflammatory conditions.  Antibiotic medicine, if an infection is present.  Medicines to dissolve blood clots.  Medicines to treat coronary artery disease.  Supportive care for conditions that do not require medicines. This may include:  Resting.  Applying heat or cold packs to injured areas.  Limiting activities until pain decreases. HOME CARE INSTRUCTIONS  If you were prescribed an antibiotic medicine, finish it all even if you start to feel better.  Avoid any activities that bring on chest pain.  Do not use any  tobacco products, including cigarettes, chewing tobacco, or electronic cigarettes. If you need help quitting, ask your health care provider.  Do not drink alcohol.  Take medicines only as directed by your health care provider.  Keep all follow-up visits as directed by your health care provider. This is important. This includes any further testing if your chest pain does not go away.  If heartburn is the cause for your chest pain, you may be told to keep your head raised (elevated) while sleeping. This reduces the chance that acid will go from your stomach into your esophagus.  Make lifestyle changes as directed by your health care provider. These may include:  Getting regular exercise. Ask your health care provider to suggest some activities that are safe for you.  Eating a heart-healthy diet. A registered dietitian can help you to learn healthy eating options.  Maintaining a healthy weight.  Managing diabetes, if necessary.  Reducing stress. SEEK MEDICAL CARE IF:  Your chest pain does not go away after treatment.  You have a rash with blisters on your chest.  You have a fever. SEEK IMMEDIATE MEDICAL CARE IF:   Your chest pain is worse.  You  have an increasing cough, or you cough up blood.  You have severe abdominal pain.  You have severe weakness.  You faint.  You have chills.  You have sudden, unexplained chest discomfort.  You have sudden, unexplained discomfort in your arms, back, neck, or jaw.  You have shortness of breath at any time.  You suddenly start to sweat, or your skin gets clammy.  You feel nauseous or you vomit.  You suddenly feel light-headed or dizzy.  Your heart begins to beat quickly, or it feels like it is skipping beats. These symptoms may represent a serious problem that is an emergency. Do not wait to see if the symptoms will go away. Get medical help right away. Call your local emergency services (911 in the [place] Do not drive yourself  to the hospital.   This information is not intended to replace advice given to you by your health care provider. Make sure you discuss any questions you have with your health care provider.   Document Released: 01/31/2005 Document Revised: 05/14/2014 Document Reviewed: 11/27/2013 Elsevier Interactive Patient Education [employer].

## 2015-05-16 NOTE — ED Provider Notes (Signed)
CSN: [id number]     Arrival date & time 05/16/15  1426 History   First MD Initiated Contact with Patient 05/16/15 1439     Chief Complaint  Patient presents with  . Chest Pain     (Consider location/radiation/quality/duration/timing/severity/associated sxs/prior Treatment) HPI  55 year old female who presents with chest pain. States she woke up yesterday morning with chest pressure. No similar symptoms in the past. Initially took acid reflux medications and percocet for symptoms, without significant relief. Feels dyspnea with this. Pain is worse with movement or bending down, coughing, and deep inspiration. No exertional activity or heavy lifting. Reports baseline cough with smoking, and no increased phlegm. Felt subjective fever yesterday.  Past Medical History  Diagnosis Date  . Breast cancer ([place])   . Neuropathy ([place])   . Osteoporosis   . Depression   . Breast cancer ([place]) 04/21/2012    Stage II (T1c N1 M0) grade 3 triple negative breast cancer, right- sided with 1 of 5 positive nodes, status post FEC in a dose dense fashion for 6 cycles followed by radiation therapy by Dr. Sondra Come for her high-risk disease with her initial date of surgery in February 2008.   [name] GERD (gastroesophageal reflux disease) 12/18/2012  . COPD (chronic obstructive pulmonary disease) ([place])   . Headache   . PTSD (post-traumatic stress disorder)    Past Surgical History  Procedure Laterality Date  . Tubal ligation    . Esophagogastroduodenoscopy  03/10/2008    SLF: Normal esophagus without evidence of Barrett, mass, erosion  ulceration, or stricture  . Leocolonoscopy  03/10/2008    [id number] terminal ileum, approximately 10 cm visualized/Normal colon without evidence of polyps, mass, inflammatory changes, diverticula, or AVMs/Normal retroflexed view of the rectum/Patchy erythema in the antrum without erosion or ulceration  . Breast surgery    . Mastectomy      bilateral   Family History  Problem Relation  Age of Onset  . Cancer Father    Social History  Substance Use Topics  . Smoking status: Current Every Day Smoker -- 0.50 packs/day    Types: Cigarettes  . Smokeless tobacco: Never Used  . Alcohol Use: 3.6 oz/week    6 Shots of liquor per week     Comment: occ   OB History    Gravida Para Term Preterm AB TAB SAB Ectopic Multiple Living   2 2 2       2      Review of Systems 10/14 systems reviewed and are negative other than those stated in the HPI    Allergies  Vicodin  Home Medications   Prior to Admission medications   Medication Sig Start Date End Date Taking? Authorizing Provider  albuterol (PROVENTIL HFA;VENTOLIN HFA) 108 (90 BASE) MCG/ACT inhaler Inhale 2 puffs into the lungs every 6 (six) hours as needed for wheezing or shortness of breath. 02/14/15  Yes Patrici Ranks, MD  clonazePAM (KLONOPIN) 1 MG tablet Take 1 tablet (1 mg total) by mouth 4 (four) times daily. 04/20/15 04/19/16 Yes Cloria Spring, MD  DULoxetine (CYMBALTA) 60 MG capsule Take 1 capsule (60 mg total) by mouth daily. 04/20/15  Yes Cloria Spring, MD  omeprazole (PRILOSEC) 20 MG capsule Take 1 capsule (20 mg total) by mouth daily. 01/18/15 02/04/16 Yes Baird Cancer, PA-C  oxyCODONE-acetaminophen (PERCOCET/ROXICET) 5-325 MG tablet Take 1 tablet by mouth daily as needed for severe pain (Pain).   Yes Historical Provider, MD  pravastatin (PRAVACHOL) 10 MG tablet Take 1  tablet by mouth every evening.  03/17/15  Yes Historical Provider, MD  QUEtiapine (SEROQUEL) 100 MG tablet Take 1 tablet (100 mg total) by mouth at bedtime. 04/20/15 04/19/16 Yes Cloria Spring, MD   BP 127/79 mmHg  Pulse 71  Temp(Src) 97.8 F (36.6 C) (Axillary)  Resp 23  Ht 5\' 4"  (1.626 m)  Wt 177 lb (80.287 kg)  BMI 30.37 kg/m2  SpO2 97%  LMP 07/09/2013 Physical Exam Physical Exam  Nursing note and vitals reviewed. Constitutional: Well developed, well nourished, non-toxic, and in no acute distress Head: Normocephalic and  atraumatic.  Mouth/Throat: Oropharynx is clear and moist.  Neck: Normal range of motion. Neck supple.  Cardiovascular: Normal rate and regular rhythm.   Pulmonary/Chest: Effort normal and breath sounds with expiratory wheezing. Tender to palpation over sternum Abdominal: Soft. There is no tenderness. There is no rebound and no guarding.  Musculoskeletal: Normal range of motion.  Neurological: Alert, no facial droop, fluent speech, moves all extremities symmetrically Skin: Skin is warm and dry.  Psychiatric: Cooperative  ED Course  Procedures (including critical care time) Labs Review Labs Reviewed  BASIC METABOLIC PANEL - Abnormal; Notable for the following:    Glucose, Bld 112 (*)    All other components within normal limits  CBC WITH DIFFERENTIAL/PLATELET - Abnormal; Notable for the following:    WBC 11.1 (*)    Hemoglobin 15.1 (*)    All other components within normal limits  D-DIMER, QUANTITATIVE (NOT AT [facility])  HCG, SERUM, QUALITATIVE  I-STAT TROPOININ, ED  I-STAT TROPOININ, ED    Imaging Review Dg Chest 2 View  05/16/2015  CLINICAL DATA:  Cough, chest congestion and pressure; history of COPD, current smoker, breast malignancy. EXAM: CHEST  2 VIEW COMPARISON:  AP portable chest x-ray of 02/23/2011 FINDINGS: The lungs are well-expanded. There is no focal infiltrate. The heart and pulmonary vascularity are normal. The mediastinum is normal in width. There is no pleural effusion. The bony thorax exhibits no acute abnormality. IMPRESSION: COPD.  There is no acute cardiopulmonary abnormality. Electronically Signed   By: David  Martinique M.D.   On: 05/16/2015 15:45   I have personally reviewed and evaluated these images and lab results as part of my medical decision-making.   EKG Interpretation   Date/Time:  Monday May 16 2015 17:58:13 EST Ventricular Rate:  79 PR Interval:  149 QRS Duration: 101 QT Interval:  428 QTC Calculation: 491 R Axis:   79 Text Interpretation:  Sinus  rhythm Low voltage, precordial leads  Borderline prolonged QT interval No significant change since last tracing  Confirmed by Filomena Pokorney MD, Hinton Dyer [id number]) on 05/16/2015 6:15:40 PM      MDM   Final diagnoses:  Chest pain, unspecified chest pain type  Chronic obstructive pulmonary disease, unspecified COPD type ([place])    55 year old female with prior history of breast cancer in remission and hyperlipidemia who presents with chest pain since yesterday morning. Chest pain reproducible with movement and palpation. Seems more MSK in nature. HEart score of 2, ruled out for ACS with serial troponins that are negative and serial EKGs without dynamic changes. Atypical for ACS, especially given over 24h of continuous pain with negative trop and EKG. Low risk for PE, but prior history of malignancy. D-dimer is negative and felt she is ruled out for PE, especially give no tachycardia or dyspnea. She does have wheezing on exam, resolved after one breathing treatment. States that her symptoms significantly improved with motrin and breathing treatment. CXR  without acute cardiopulmonary processes. Symptoms non-concerning for dissection or other serious cardiac or intrathoracic etiologies. Felt ruled out for serious causes of chest pain. Discussed supportive care instructions for home. Strict return and follow-up instructions reviewed. She expressed understanding of all discharge instructions and felt comfortable with the plan of care.   Forde Dandy, MD 05/16/15 [phone]

## 2015-05-16 NOTE — ED Notes (Signed)
RT called

## 2015-05-25 ENCOUNTER — Encounter (HOSPITAL_COMMUNITY): Payer: Self-pay | Admitting: *Deleted

## 2015-05-25 ENCOUNTER — Emergency Department (HOSPITAL_COMMUNITY)
Admission: EM | Admit: 2015-05-25 | Discharge: 2015-05-25 | Discharge status: Against Medical Advice | Payer: Medicare HMO | Attending: Emergency Medicine | Admitting: Emergency Medicine

## 2015-05-25 DIAGNOSIS — J449 Chronic obstructive pulmonary disease, unspecified: Secondary | ICD-10-CM | POA: Insufficient documentation

## 2015-05-25 DIAGNOSIS — R079 Chest pain, unspecified: Secondary | ICD-10-CM | POA: Insufficient documentation

## 2015-05-25 DIAGNOSIS — J069 Acute upper respiratory infection, unspecified: Secondary | ICD-10-CM | POA: Insufficient documentation

## 2015-05-25 DIAGNOSIS — F17210 Nicotine dependence, cigarettes, uncomplicated: Secondary | ICD-10-CM | POA: Diagnosis not present

## 2015-05-25 NOTE — ED Notes (Signed)
Pt c/o chest pain x 1 week; pt was dx with a URI and states the pain has not gotten any better

## 2015-05-25 NOTE — ED Notes (Signed)
Pt was called 3 times with no answer.

## 2015-05-27 ENCOUNTER — Emergency Department (HOSPITAL_COMMUNITY): Payer: Medicare HMO

## 2015-05-27 ENCOUNTER — Encounter (HOSPITAL_COMMUNITY): Payer: Self-pay

## 2015-05-27 ENCOUNTER — Emergency Department (HOSPITAL_COMMUNITY)
Admission: EM | Admit: 2015-05-27 | Discharge: 2015-05-27 | Disposition: A | Discharge status: Home / Self Care | Payer: Medicare HMO | Attending: Emergency Medicine | Admitting: Emergency Medicine

## 2015-05-27 DIAGNOSIS — R05 Cough: Secondary | ICD-10-CM | POA: Diagnosis not present

## 2015-05-27 DIAGNOSIS — Z8739 Personal history of other diseases of the musculoskeletal system and connective tissue: Secondary | ICD-10-CM | POA: Diagnosis not present

## 2015-05-27 DIAGNOSIS — J441 Chronic obstructive pulmonary disease with (acute) exacerbation: Secondary | ICD-10-CM | POA: Insufficient documentation

## 2015-05-27 DIAGNOSIS — R110 Nausea: Secondary | ICD-10-CM | POA: Insufficient documentation

## 2015-05-27 DIAGNOSIS — R079 Chest pain, unspecified: Secondary | ICD-10-CM | POA: Insufficient documentation

## 2015-05-27 DIAGNOSIS — R059 Cough, unspecified: Secondary | ICD-10-CM

## 2015-05-27 DIAGNOSIS — F17210 Nicotine dependence, cigarettes, uncomplicated: Secondary | ICD-10-CM | POA: Diagnosis not present

## 2015-05-27 DIAGNOSIS — Z853 Personal history of malignant neoplasm of breast: Secondary | ICD-10-CM | POA: Diagnosis not present

## 2015-05-27 DIAGNOSIS — F4310 Post-traumatic stress disorder, unspecified: Secondary | ICD-10-CM | POA: Diagnosis not present

## 2015-05-27 DIAGNOSIS — Z79899 Other long term (current) drug therapy: Secondary | ICD-10-CM | POA: Insufficient documentation

## 2015-05-27 DIAGNOSIS — Z9889 Other specified postprocedural states: Secondary | ICD-10-CM | POA: Insufficient documentation

## 2015-05-27 DIAGNOSIS — Z8669 Personal history of other diseases of the nervous system and sense organs: Secondary | ICD-10-CM | POA: Diagnosis not present

## 2015-05-27 DIAGNOSIS — F329 Major depressive disorder, single episode, unspecified: Secondary | ICD-10-CM | POA: Insufficient documentation

## 2015-05-27 DIAGNOSIS — K219 Gastro-esophageal reflux disease without esophagitis: Secondary | ICD-10-CM | POA: Insufficient documentation

## 2015-05-27 LAB — CBC WITH DIFFERENTIAL/PLATELET
BASOS PCT: 0 %
Basophils Absolute: 0 10*3/uL (ref 0.0–0.1)
Eosinophils Absolute: 0.2 10*3/uL (ref 0.0–0.7)
Eosinophils Relative: 2 %
HEMATOCRIT: 44.4 % (ref 36.0–46.0)
HEMOGLOBIN: 14.9 g/dL (ref 12.0–15.0)
LYMPHS PCT: 29 %
Lymphs Abs: 2.9 10*3/uL (ref 0.7–4.0)
MCH: 31.8 pg (ref 26.0–34.0)
MCHC: 33.6 g/dL (ref 30.0–36.0)
MCV: 94.7 fL (ref 78.0–100.0)
MONO ABS: 0.7 10*3/uL (ref 0.1–1.0)
MONOS PCT: 7 %
NEUTROS ABS: 6.1 10*3/uL (ref 1.7–7.7)
NEUTROS PCT: 62 %
Platelets: 247 10*3/uL (ref 150–400)
RBC: 4.69 MIL/uL (ref 3.87–5.11)
RDW: 13 % (ref 11.5–15.5)
WBC: 9.9 10*3/uL (ref 4.0–10.5)

## 2015-05-27 LAB — COMPREHENSIVE METABOLIC PANEL
ALBUMIN: 3.9 g/dL (ref 3.5–5.0)
ALK PHOS: 66 U/L (ref 38–126)
ALT: 21 U/L (ref 14–54)
ANION GAP: 9 (ref 5–15)
AST: 22 U/L (ref 15–41)
BILIRUBIN TOTAL: 0.4 mg/dL (ref 0.3–1.2)
BUN: 9 mg/dL (ref 6–20)
CALCIUM: 9.4 mg/dL (ref 8.9–10.3)
CO2: 27 mmol/L (ref 22–32)
Chloride: 104 mmol/L (ref 101–111)
Creatinine, Ser: 0.58 mg/dL (ref 0.44–1.00)
GFR calc Af Amer: >60 mL/min (ref >60)
GLUCOSE: 105 mg/dL — AB (ref 65–99)
POTASSIUM: 4.1 mmol/L (ref 3.5–5.1)
Sodium: 140 mmol/L (ref 135–145)
TOTAL PROTEIN: 7 g/dL (ref 6.5–8.1)

## 2015-05-27 LAB — TROPONIN I: Troponin I: <0.03 ng/mL (ref <0.031)

## 2015-05-27 LAB — D-DIMER, QUANTITATIVE: D-Dimer, Quant: <0.27 ug/mL-FEU (ref 0.00–0.50)

## 2015-05-27 LAB — LIPASE, BLOOD: Lipase: 35 U/L (ref 11–51)

## 2015-05-27 MED ORDER — IBUPROFEN 800 MG PO TABS: 800.0000 mg | ORAL_TABLET | Freq: Three times a day (TID) | ORAL | Status: DC | PRN

## 2015-05-27 MED ORDER — PREDNISONE 20 MG PO TABS
40.0000 mg | ORAL_TABLET | Freq: Once | ORAL | Status: AC
  Administered 2015-05-27: 40 mg via ORAL
  Filled 2015-05-27: qty 2

## 2015-05-27 MED ORDER — SODIUM CHLORIDE 0.9 % IV BOLUS (SEPSIS)
1000.0000 mL | Freq: Once | INTRAVENOUS | Status: AC
  Administered 2015-05-27: 1000 mL via INTRAVENOUS

## 2015-05-27 MED ORDER — PREDNISONE 20 MG PO TABS: 40.0000 mg | ORAL_TABLET | Freq: Every day | ORAL | Status: DC

## 2015-05-27 MED ORDER — KETOROLAC TROMETHAMINE 30 MG/ML IJ SOLN
30.0000 mg | Freq: Once | INTRAMUSCULAR | Status: AC
  Administered 2015-05-27: 30 mg via INTRAVENOUS
  Filled 2015-05-27: qty 1

## 2015-05-27 MED ORDER — IPRATROPIUM-ALBUTEROL 0.5-2.5 (3) MG/3ML IN SOLN
3.0000 mL | RESPIRATORY_TRACT | Status: DC
  Administered 2015-05-27: 3 mL via RESPIRATORY_TRACT
  Filled 2015-05-27: qty 3

## 2015-05-27 NOTE — ED Notes (Signed)
Pt complain of pain in let chest area for two days. States the pain is worse with movement and when taking a deep breath. States she was here two days ago for same.

## 2015-05-27 NOTE — ED Notes (Addendum)
Unable to get blood from IV start. Lab at bedside.

## 2015-05-27 NOTE — ED Notes (Addendum)
Respiratory contacted related to breathing treatment and made aware pt currently in xray.

## 2015-05-27 NOTE — ED Provider Notes (Signed)
CSN: [id number]     Arrival date & time 05/27/15  0704 History   First MD Initiated Contact with Patient 05/27/15 [phone]     Chief Complaint  Patient presents with  . Chest Pain     (Consider location/radiation/quality/duration/timing/severity/associated sxs/prior Treatment) Patient is a 55 y.o. female presenting with chest pain.  Chest Pain Pain location:  L chest Pain quality: sharp   Pain radiates to:  Does not radiate Pain radiates to the back: no   Pain severity:  Mild Onset quality:  Gradual Duration:  2 days Timing:  Constant Progression:  Worsening Chronicity:  New Context: not breathing, not at rest and no stress   Relieved by:  None tried Worsened by:  Nothing tried Ineffective treatments:  None tried Associated symptoms: cough and nausea   Associated symptoms: no abdominal pain, no anxiety, no fever, no PND and not vomiting     Past Medical History  Diagnosis Date  . Breast cancer ([place])   . Neuropathy ([place])   . Osteoporosis   . Depression   . Breast cancer ([place]) 04/21/2012    Stage II (T1c N1 M0) grade 3 triple negative breast cancer, right- sided with 1 of 5 positive nodes, status post FEC in a dose dense fashion for 6 cycles followed by radiation therapy by Dr. Sondra Come for her high-risk disease with her initial date of surgery in February 2008.   [name] GERD (gastroesophageal reflux disease) 12/18/2012  . COPD (chronic obstructive pulmonary disease) ([place])   . Headache   . PTSD (post-traumatic stress disorder)    Past Surgical History  Procedure Laterality Date  . Tubal ligation    . Esophagogastroduodenoscopy  03/10/2008    SLF: Normal esophagus without evidence of Barrett, mass, erosion  ulceration, or stricture  . Leocolonoscopy  03/10/2008    [id number] terminal ileum, approximately 10 cm visualized/Normal colon without evidence of polyps, mass, inflammatory changes, diverticula, or AVMs/Normal retroflexed view of the rectum/Patchy erythema in the antrum  without erosion or ulceration  . Breast surgery    . Mastectomy      bilateral   Family History  Problem Relation Age of Onset  . Cancer Father    Social History  Substance Use Topics  . Smoking status: Current Every Day Smoker -- 0.50 packs/day    Types: Cigarettes  . Smokeless tobacco: Never Used  . Alcohol Use: 3.6 oz/week    6 Shots of liquor per week     Comment: occ   OB History    Gravida Para Term Preterm AB TAB SAB Ectopic Multiple Living   2 2 2       2      Review of Systems  Constitutional: Negative for fever and chills.  Respiratory: Positive for cough.   Cardiovascular: Positive for chest pain. Negative for PND.  Gastrointestinal: Positive for nausea. Negative for vomiting and abdominal pain.  Endocrine: Negative for polydipsia and polyuria.  Genitourinary: Negative for dysuria.  All other systems reviewed and are negative.     Allergies  Vicodin  Home Medications   Prior to Admission medications   Medication Sig Start Date End Date Taking? Authorizing Provider  albuterol (PROVENTIL HFA;VENTOLIN HFA) 108 (90 BASE) MCG/ACT inhaler Inhale 2 puffs into the lungs every 6 (six) hours as needed for wheezing or shortness of breath. 02/14/15  Yes Patrici Ranks, MD  clonazePAM (KLONOPIN) 1 MG tablet Take 1 tablet (1 mg total) by mouth 4 (four) times daily. 04/20/15 04/19/16 Yes Cloria Spring,  MD  DULoxetine (CYMBALTA) 60 MG capsule Take 1 capsule (60 mg total) by mouth daily. 04/20/15  Yes Cloria Spring, MD  omeprazole (PRILOSEC) 20 MG capsule Take 1 capsule (20 mg total) by mouth daily. 01/18/15 02/04/16 Yes Baird Cancer, PA-C  oxyCODONE-acetaminophen (PERCOCET/ROXICET) 5-325 MG tablet Take 1 tablet by mouth daily as needed for severe pain (Pain).   Yes Historical Provider, MD  pravastatin (PRAVACHOL) 10 MG tablet Take 1 tablet by mouth every evening.  03/17/15  Yes Historical Provider, MD  QUEtiapine (SEROQUEL) 100 MG tablet Take 1 tablet (100 mg total)  by mouth at bedtime. 04/20/15 04/19/16 Yes Cloria Spring, MD  ibuprofen (ADVIL,MOTRIN) 800 MG tablet Take 1 tablet (800 mg total) by mouth every 8 (eight) hours as needed. 05/27/15   Merrily Pew, MD  predniSONE (DELTASONE) 20 MG tablet Take 2 tablets (40 mg total) by mouth daily with breakfast. 05/28/15   Merrily Pew, MD   BP 126/76 mmHg  Pulse 85  Temp(Src) 98.5 F (36.9 C) (Axillary)  Resp 26  Ht 5\' 4"  (1.626 m)  Wt 174 lb (78.926 kg)  BMI 29.85 kg/m2  SpO2 91%  LMP 07/09/2013 Physical Exam  Constitutional: She is oriented to person, place, and time. She appears well-developed and well-nourished.  HENT:  Head: Normocephalic and atraumatic.  Neck: Normal range of motion.  Cardiovascular: Normal rate and regular rhythm.   Pulmonary/Chest: Effort normal. No stridor. No respiratory distress. She has wheezes. She exhibits tenderness.  Abdominal: Soft. She exhibits no distension. There is no tenderness.  Musculoskeletal: Normal range of motion. She exhibits no edema or tenderness.  Neurological: She is alert and oriented to person, place, and time. No cranial nerve deficit.  Skin: Skin is warm and dry.  Nursing note and vitals reviewed.   ED Course  Procedures (including critical care time) Labs Review Labs Reviewed  COMPREHENSIVE METABOLIC PANEL - Abnormal; Notable for the following:    Glucose, Bld 105 (*)    All other components within normal limits  CBC WITH DIFFERENTIAL/PLATELET  TROPONIN I  D-DIMER, QUANTITATIVE (NOT AT [facility])  LIPASE, BLOOD  TROPONIN I    Imaging Review Dg Chest 2 View  05/27/2015  CLINICAL DATA:  Pneumonia with left-sided chest pain radiating to the arm. Evaluate for pneumothorax. EXAM: CHEST  2 VIEW COMPARISON:  05/16/2015 FINDINGS: Mild hyperinflation with interstitial coarsening, correlating with COPD history. There is mild atelectasis at the right base. There is no edema, consolidation, effusion, or pneumothorax. Normal heart size and aortic  contours. IMPRESSION: 1. No active cardiopulmonary disease. 2. COPD. Electronically Signed   By: Monte Fantasia M.D.   On: 05/27/2015 08:46   I have personally reviewed and evaluated these images and lab results as part of my medical decision-making.   EKG Interpretation   Date/Time:  Friday May 27 2015 07:13:37 EST Ventricular Rate:  84 PR Interval:  139 QRS Duration: 93 QT Interval:  389 QTC Calculation: 460 R Axis:   84 Text Interpretation:  Sinus rhythm Confirmed by HORTON  MD, COURTNEY  [id number]) on 05/27/2015 7:24:54 AM      MDM   Final diagnoses:  Chest pain, unspecified chest pain type  Cough    Nonspecific chest pain. Likely related to bronchitis.  Low HEART score, atypical type pain, delta troponins negative, ecg unchanged without evidence of ischemia, all making ACS unlikely.  Low Well's Score, doubt PE.  No e/o Pneumonia, no indication for antibiotics.  Based on H&P, low concern for dissection,  pneumothorax, pericarditis, boerhaves or other emergent causes for their symptoms at this time.     Merrily Pew, MD 05/27/15 1356

## 2015-07-19 ENCOUNTER — Ambulatory Visit (INDEPENDENT_AMBULATORY_CARE_PROVIDER_SITE_OTHER): Payer: Medicare Other | Admitting: Psychiatry

## 2015-07-19 ENCOUNTER — Encounter (HOSPITAL_COMMUNITY): Payer: Self-pay | Admitting: Psychiatry

## 2015-07-19 VITALS — BP 105/58 | HR 86 | Ht 64.0 in | Wt 182.2 lb

## 2015-07-19 DIAGNOSIS — F4310 Post-traumatic stress disorder, unspecified: Secondary | ICD-10-CM | POA: Diagnosis not present

## 2015-07-19 DIAGNOSIS — F411 Generalized anxiety disorder: Secondary | ICD-10-CM | POA: Diagnosis not present

## 2015-07-19 DIAGNOSIS — F332 Major depressive disorder, recurrent severe without psychotic features: Secondary | ICD-10-CM

## 2015-07-19 MED ORDER — QUETIAPINE FUMARATE 100 MG PO TABS: 200.0000 mg | ORAL_TABLET | Freq: Every day | ORAL | Status: DC

## 2015-07-19 MED ORDER — DULOXETINE HCL 60 MG PO CPEP: 60.0000 mg | ORAL_CAPSULE | Freq: Every day | ORAL | Status: DC

## 2015-07-19 MED ORDER — CLONAZEPAM 1 MG PO TABS: 1.0000 mg | ORAL_TABLET | Freq: Four times a day (QID) | ORAL | Status: DC

## 2015-07-19 MED ORDER — DULOXETINE HCL 60 MG PO CPEP: 60.0000 mg | ORAL_CAPSULE | Freq: Two times a day (BID) | ORAL | Status: DC
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
## 2015-07-21 ENCOUNTER — Telehealth (HOSPITAL_COMMUNITY): Payer: Self-pay | Admitting: *Deleted

## 2015-07-21 NOTE — Telephone Encounter (Signed)
She will need to keep the dose on the low end-75-100 mg because she is on a high dose of cymbalta

## 2015-07-21 NOTE — Telephone Encounter (Signed)
phone call from patient, she wants to know if she can take Wellbutrin along with what she is taking to quit smoking.

## 2015-07-22 ENCOUNTER — Other Ambulatory Visit (HOSPITAL_COMMUNITY): Payer: Self-pay | Admitting: Psychiatry

## 2015-07-22 MED ORDER — BUPROPION HCL 75 MG PO TABS: 75.0000 mg | ORAL_TABLET | Freq: Three times a day (TID) | ORAL | Status: DC

## 2015-07-22 NOTE — Telephone Encounter (Signed)
sent 

## 2015-07-22 NOTE — Telephone Encounter (Signed)
lmtcb

## 2015-07-22 NOTE — Telephone Encounter (Signed)
Informed pt what provider stated and pt would like provider to send script into pharmacy.

## 2015-07-26 NOTE — Telephone Encounter (Signed)
Pt is aware script is sent to pharmacy

## 2015-07-29 ENCOUNTER — Telehealth (HOSPITAL_COMMUNITY): Payer: Self-pay | Admitting: *Deleted

## 2015-07-29 ENCOUNTER — Other Ambulatory Visit (HOSPITAL_COMMUNITY): Payer: Self-pay | Admitting: Psychiatry

## 2015-07-29 MED ORDER — BUPROPION HCL 75 MG PO TABS: 75.0000 mg | ORAL_TABLET | Freq: Three times a day (TID) | ORAL | Status: DC

## 2015-07-29 NOTE — Telephone Encounter (Signed)
Pt called stating her pharmacy only received a Quantity of 30 tablets for her Wellbutrin. Per pt, she takes her Wellbutrin TID and would like to see if Dr. Harrington Challenger could call the pharmacy and approve her medication to last her a month. Pt number is [phone]

## 2015-07-29 NOTE — Telephone Encounter (Signed)
Phone call from patient, she said Wellbutrin 30 pills three times a day is only a 10 day supply.   She need some one to call her.

## 2015-07-29 NOTE — Telephone Encounter (Signed)
corrected

## 2015-08-01 NOTE — Telephone Encounter (Signed)
noted 

## 2015-08-11 ENCOUNTER — Ambulatory Visit (INDEPENDENT_AMBULATORY_CARE_PROVIDER_SITE_OTHER): Payer: Medicare Other | Admitting: Psychology

## 2015-08-11 ENCOUNTER — Encounter (HOSPITAL_COMMUNITY): Payer: Self-pay | Admitting: Psychology

## 2015-08-11 DIAGNOSIS — F332 Major depressive disorder, recurrent severe without psychotic features: Secondary | ICD-10-CM | POA: Diagnosis not present

## 2015-08-11 NOTE — Progress Notes (Signed)
  The patient has had a lot of trouble due to mother recently dying of cancer recently.  The patient has had to worry about isf she is going to have to move as she lives on her sister's land and the sister is about to get a divorce.  Ilean Skill, Psy.D.

## 2015-08-30 ENCOUNTER — Ambulatory Visit (INDEPENDENT_AMBULATORY_CARE_PROVIDER_SITE_OTHER): Payer: Medicare Other | Admitting: Psychiatry

## 2015-08-30 ENCOUNTER — Encounter (HOSPITAL_COMMUNITY): Payer: Self-pay | Admitting: Psychiatry

## 2015-08-30 VITALS — BP 112/67 | HR 68 | Ht 64.0 in | Wt 184.4 lb

## 2015-08-30 DIAGNOSIS — F332 Major depressive disorder, recurrent severe without psychotic features: Secondary | ICD-10-CM

## 2015-08-30 DIAGNOSIS — F4310 Post-traumatic stress disorder, unspecified: Secondary | ICD-10-CM | POA: Diagnosis not present

## 2015-08-30 DIAGNOSIS — F411 Generalized anxiety disorder: Secondary | ICD-10-CM

## 2015-08-30 MED ORDER — DULOXETINE HCL 60 MG PO CPEP: 60.0000 mg | ORAL_CAPSULE | Freq: Two times a day (BID) | ORAL | Status: DC

## 2015-08-30 MED ORDER — BUPROPION HCL 75 MG PO TABS: 75.0000 mg | ORAL_TABLET | Freq: Three times a day (TID) | ORAL | Status: DC

## 2015-08-30 MED ORDER — QUETIAPINE FUMARATE 100 MG PO TABS
200.0000 mg | ORAL_TABLET | Freq: Every day | ORAL | Status: DC
Start: 2015-08-30 — End: 2015-11-29
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
## 2015-09-07 ENCOUNTER — Ambulatory Visit (INDEPENDENT_AMBULATORY_CARE_PROVIDER_SITE_OTHER): Payer: Medicare Other | Admitting: Psychology

## 2015-09-07 ENCOUNTER — Encounter (HOSPITAL_COMMUNITY): Payer: Self-pay | Admitting: Psychology

## 2015-09-07 DIAGNOSIS — F332 Major depressive disorder, recurrent severe without psychotic features: Secondary | ICD-10-CM | POA: Diagnosis not present

## 2015-09-07 NOTE — Progress Notes (Signed)
  The patient reports that she has had more issues of depression recently with the birthday of her mother's birth day and having to clean out of her parents house with father moving out to live at her sister.  Worked on coping skills around depression.  Ilean Skill, Psy.D.

## 2015-10-10 ENCOUNTER — Ambulatory Visit (HOSPITAL_COMMUNITY): Payer: Self-pay | Admitting: Psychology

## 2015-10-27 ENCOUNTER — Telehealth (HOSPITAL_COMMUNITY): Payer: Self-pay | Admitting: *Deleted

## 2015-10-27 NOTE — Telephone Encounter (Signed)
lmtcb

## 2015-10-27 NOTE — Telephone Encounter (Signed)
Called pt due to previous phone call. lmtcb and number provided. Per pt chart, pt Seroquel was sent via e-scribe to pt pharmacy on 08-30-15 with 180 tablets and had 2 refills. Office will try to call pt at another time.

## 2015-10-27 NOTE — Telephone Encounter (Signed)
Phone call from patient regarding QUEtiapine was changed to two a day.   Pharmacy is saying they have no refills on it.

## 2015-10-28 NOTE — Telephone Encounter (Signed)
Spoke with pt and informed her that she should have more refills for her Quetiapine. Informed pt to call her pharmacy and see if they have it on hold and if they don't to have pharmacy call office. Pt verbalized understanding and stated she will.

## 2015-11-14 ENCOUNTER — Ambulatory Visit (INDEPENDENT_AMBULATORY_CARE_PROVIDER_SITE_OTHER): Payer: Medicare Other | Admitting: Psychology

## 2015-11-14 DIAGNOSIS — F332 Major depressive disorder, recurrent severe without psychotic features: Secondary | ICD-10-CM | POA: Diagnosis not present

## 2015-11-16 ENCOUNTER — Encounter (HOSPITAL_COMMUNITY): Payer: Self-pay | Admitting: Psychology

## 2015-11-16 NOTE — Progress Notes (Signed)
  The patient continues to struggle with issues related to depression. However, she reports that she has been doing better overall and that she has been actively working on coping skills around these issues of depression.  Ilean Skill, Psy.D.

## 2015-11-29 ENCOUNTER — Ambulatory Visit (INDEPENDENT_AMBULATORY_CARE_PROVIDER_SITE_OTHER): Payer: Medicare Other | Admitting: Psychiatry

## 2015-11-29 ENCOUNTER — Encounter (HOSPITAL_COMMUNITY): Payer: Self-pay | Admitting: Psychiatry

## 2015-11-29 VITALS — BP 130/86 | Ht 64.0 in | Wt 193.0 lb

## 2015-11-29 DIAGNOSIS — F332 Major depressive disorder, recurrent severe without psychotic features: Secondary | ICD-10-CM | POA: Diagnosis not present

## 2015-11-29 DIAGNOSIS — F3162 Bipolar disorder, current episode mixed, moderate: Secondary | ICD-10-CM

## 2015-11-29 MED ORDER — CLONAZEPAM 1 MG PO TABS: 1.0000 mg | ORAL_TABLET | Freq: Four times a day (QID) | ORAL | 1 refills | Status: DC

## 2015-11-29 MED ORDER — DULOXETINE HCL 60 MG PO CPEP: 60.0000 mg | ORAL_CAPSULE | Freq: Two times a day (BID) | ORAL | 2 refills | Status: DC

## 2015-11-29 MED ORDER — TEMAZEPAM 15 MG PO CAPS
15.0000 mg | ORAL_CAPSULE | Freq: Every evening | ORAL | 2 refills | Status: DC | PRN
Start: 2015-11-29 — End: 2016-02-29

## 2015-11-29 MED ORDER — BUPROPION HCL 75 MG PO TABS
75.0000 mg | ORAL_TABLET | Freq: Three times a day (TID) | ORAL | 2 refills | Status: DC
Start: 2015-11-29 — End: 2016-02-29
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
## 2015-11-30 LAB — THYROID PANEL WITH TSH
FREE THYROXINE INDEX: 1.1 — AB (ref 1.4–3.8)
T3 UPTAKE: 28 % (ref 22–35)
T4 TOTAL: 3.8 ug/dL — AB (ref 4.5–12.0)
TSH: 2.26 m[IU]/L

## 2015-11-30 NOTE — Progress Notes (Signed)
Called pt and lmtcb and office number provided.  

## 2015-12-05 ENCOUNTER — Telehealth (HOSPITAL_COMMUNITY): Payer: Self-pay | Admitting: *Deleted

## 2015-12-05 ENCOUNTER — Other Ambulatory Visit (HOSPITAL_COMMUNITY): Payer: Self-pay | Admitting: Psychiatry

## 2015-12-05 NOTE — Telephone Encounter (Signed)
Per Dr. Harrington Challenger with Previous verbal to call pt due to pt labs results to call her PCP office. Called pt and lmtcb and number provided. Provider also wants office to release pt lab results to pt PCP office but office do not have release on file for pt and unable to reach pt to verify name of PCP before labs are released.

## 2015-12-13 ENCOUNTER — Ambulatory Visit (HOSPITAL_COMMUNITY): Payer: Self-pay | Admitting: Psychology

## 2015-12-20 ENCOUNTER — Encounter (HOSPITAL_COMMUNITY): Payer: Self-pay | Admitting: Psychology

## 2016-02-07 ENCOUNTER — Other Ambulatory Visit (HOSPITAL_COMMUNITY): Payer: Self-pay | Admitting: Psychiatry

## 2016-02-16 ENCOUNTER — Ambulatory Visit (HOSPITAL_COMMUNITY): Payer: Self-pay | Admitting: Hematology & Oncology

## 2016-02-16 ENCOUNTER — Other Ambulatory Visit (HOSPITAL_COMMUNITY): Payer: Self-pay

## 2016-02-18 NOTE — Progress Notes (Signed)
This encounter was created in error - please disregard.

## 2016-02-29 ENCOUNTER — Ambulatory Visit (INDEPENDENT_AMBULATORY_CARE_PROVIDER_SITE_OTHER): Payer: Medicare Other | Admitting: Psychiatry

## 2016-02-29 ENCOUNTER — Encounter (HOSPITAL_COMMUNITY): Payer: Self-pay | Admitting: Psychiatry

## 2016-02-29 VITALS — BP 118/66 | HR 85 | Ht 64.0 in | Wt 182.2 lb

## 2016-02-29 DIAGNOSIS — F332 Major depressive disorder, recurrent severe without psychotic features: Secondary | ICD-10-CM | POA: Diagnosis not present

## 2016-02-29 DIAGNOSIS — F4310 Post-traumatic stress disorder, unspecified: Secondary | ICD-10-CM

## 2016-02-29 DIAGNOSIS — F411 Generalized anxiety disorder: Secondary | ICD-10-CM | POA: Diagnosis not present

## 2016-02-29 DIAGNOSIS — F3162 Bipolar disorder, current episode mixed, moderate: Secondary | ICD-10-CM

## 2016-02-29 MED ORDER — CLONAZEPAM 1 MG PO TABS: 1.0000 mg | ORAL_TABLET | Freq: Four times a day (QID) | ORAL | 2 refills | Status: DC

## 2016-02-29 MED ORDER — BUPROPION HCL 75 MG PO TABS: 75.0000 mg | ORAL_TABLET | Freq: Three times a day (TID) | ORAL | 2 refills | Status: DC

## 2016-02-29 MED ORDER — DULOXETINE HCL 60 MG PO CPEP: 60.0000 mg | ORAL_CAPSULE | Freq: Two times a day (BID) | ORAL | 2 refills | Status: DC

## 2016-02-29 MED ORDER — TRAZODONE HCL 100 MG PO TABS: ORAL_TABLET | ORAL | 2 refills | Status: DC
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
## 2016-03-20 ENCOUNTER — Encounter (HOSPITAL_COMMUNITY): Payer: Self-pay | Admitting: Emergency Medicine

## 2016-03-20 ENCOUNTER — Emergency Department (HOSPITAL_COMMUNITY)
Admission: EM | Admit: 2016-03-20 | Discharge: 2016-03-20 | Disposition: A | Discharge status: Home / Self Care | Payer: Medicare Other | Attending: Emergency Medicine | Admitting: Emergency Medicine

## 2016-03-20 DIAGNOSIS — R112 Nausea with vomiting, unspecified: Secondary | ICD-10-CM

## 2016-03-20 DIAGNOSIS — Z7951 Long term (current) use of inhaled steroids: Secondary | ICD-10-CM | POA: Insufficient documentation

## 2016-03-20 DIAGNOSIS — R197 Diarrhea, unspecified: Secondary | ICD-10-CM | POA: Insufficient documentation

## 2016-03-20 DIAGNOSIS — Z79899 Other long term (current) drug therapy: Secondary | ICD-10-CM | POA: Insufficient documentation

## 2016-03-20 DIAGNOSIS — Z791 Long term (current) use of non-steroidal anti-inflammatories (NSAID): Secondary | ICD-10-CM | POA: Diagnosis not present

## 2016-03-20 DIAGNOSIS — Z853 Personal history of malignant neoplasm of breast: Secondary | ICD-10-CM | POA: Insufficient documentation

## 2016-03-20 DIAGNOSIS — J449 Chronic obstructive pulmonary disease, unspecified: Secondary | ICD-10-CM | POA: Insufficient documentation

## 2016-03-20 DIAGNOSIS — F17210 Nicotine dependence, cigarettes, uncomplicated: Secondary | ICD-10-CM | POA: Diagnosis not present

## 2016-03-20 DIAGNOSIS — K602 Anal fissure, unspecified: Secondary | ICD-10-CM | POA: Diagnosis not present

## 2016-03-20 DIAGNOSIS — K625 Hemorrhage of anus and rectum: Secondary | ICD-10-CM | POA: Diagnosis present

## 2016-03-20 LAB — COMPREHENSIVE METABOLIC PANEL
ALT: 17 U/L (ref 14–54)
AST: 16 U/L (ref 15–41)
Albumin: 4.3 g/dL (ref 3.5–5.0)
Alkaline Phosphatase: 58 U/L (ref 38–126)
Anion gap: 8 (ref 5–15)
BUN: 9 mg/dL (ref 6–20)
CHLORIDE: 102 mmol/L (ref 101–111)
CO2: 28 mmol/L (ref 22–32)
CREATININE: 0.58 mg/dL (ref 0.44–1.00)
Calcium: 9.1 mg/dL (ref 8.9–10.3)
GFR calc Af Amer: >60 mL/min (ref >60)
GLUCOSE: 102 mg/dL — AB (ref 65–99)
Potassium: 3.7 mmol/L (ref 3.5–5.1)
SODIUM: 138 mmol/L (ref 135–145)
Total Bilirubin: 0.5 mg/dL (ref 0.3–1.2)
Total Protein: 7.5 g/dL (ref 6.5–8.1)

## 2016-03-20 LAB — CBC
HCT: 42.2 % (ref 36.0–46.0)
Hemoglobin: 13.9 g/dL (ref 12.0–15.0)
MCH: 31 pg (ref 26.0–34.0)
MCHC: 32.9 g/dL (ref 30.0–36.0)
MCV: 94.2 fL (ref 78.0–100.0)
PLATELETS: 304 10*3/uL (ref 150–400)
RBC: 4.48 MIL/uL (ref 3.87–5.11)
RDW: 13.4 % (ref 11.5–15.5)
WBC: 8.5 10*3/uL (ref 4.0–10.5)

## 2016-03-20 LAB — TYPE AND SCREEN
ABO/RH(D): B POS
Antibody Screen: NEGATIVE

## 2016-03-20 LAB — POC OCCULT BLOOD, ED: Fecal Occult Bld: NEGATIVE

## 2016-03-20 NOTE — ED Provider Notes (Signed)
[facility] DEPT Provider Note   CSN: [id number] Arrival date & time: 03/20/16  1118  By signing my name below, I, Higinio Plan, attest that this documentation has been prepared under the direction and in the presence of Daleen Bo, MD . Electronically Signed: Higinio Plan, Scribe. 03/20/2016. 1:18 PM.  History   Chief Complaint Chief Complaint  Patient presents with  . GI Bleeding   The history is provided by the patient. No language interpreter was used.   HPI Comments: [name] is a 55 y.o. female who presents to the Emergency Department for an evaluation of rectal bleeding. Pt reports she began to experience persistent, diarrhea 3 days ago followed by abdominal pain and cramping 2 days ago. She notes she was passing a bowel movement 2 days ago when she noticed "blood in the commode" and again "after wiping." She states she has only experienced 2 episodes of rectal bleeding. Pt reports she visited Urgent Care this morning for her symptoms and was advised to visit the ED. She states associated mild dizziness now in the ED but denies fever, cough, weakness, decreased appetite, eating unusual foods recently and sick contacts. She reports hx of breast cancer and states she is currently followed at [facility], Following nipple reconstructive surgery. She is not currently on antibiotics. There are no other known modifying factors.  Past Medical History:  Diagnosis Date  . Breast cancer ([place])   . Breast cancer ([place]) 04/21/2012   Stage II (T1c N1 M0) grade 3 triple negative breast cancer, right- sided with 1 of 5 positive nodes, status post FEC in a dose dense fashion for 6 cycles followed by radiation therapy by Dr. Sondra Come for her high-risk disease with her initial date of surgery in February 2008.   [name] COPD (chronic obstructive pulmonary disease) ([place])   . Depression   . GERD (gastroesophageal reflux disease) 12/18/2012  . Headache   . Neuropathy ([place])   . Osteoporosis   . PTSD  (post-traumatic stress disorder)     Patient Active Problem List   Diagnosis Date Noted  . Major depression 02/05/2014  . Pain in joint, shoulder region 07/23/2013  . Decreased range of motion of shoulder 07/23/2013  . 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 Tobacco abuse 12/18/2012  . GERD (gastroesophageal reflux disease) 12/18/2012  . Breast cancer ([place]) 04/21/2012    Past Surgical History:  Procedure Laterality Date  . BREAST SURGERY    . ESOPHAGOGASTRODUODENOSCOPY  03/10/2008   SLF: Normal esophagus without evidence of Barrett, mass, erosion  ulceration, or stricture  . leocolonoscopy  03/10/2008   [id number] terminal ileum, approximately 10 cm visualized/Normal colon without evidence of polyps, mass, inflammatory changes, diverticula, or AVMs/Normal retroflexed view of the rectum/Patchy erythema in the antrum without erosion or ulceration  . MASTECTOMY     bilateral  . TUBAL LIGATION      OB History    Gravida Para Term Preterm AB Living   2 2 2     2    SAB TAB Ectopic Multiple Live Births                 Home Medications    Prior to Admission medications   Medication Sig Start Date End Date Taking? Authorizing Provider  albuterol (PROVENTIL HFA;VENTOLIN HFA) 108 (90 BASE) MCG/ACT inhaler Inhale 2 puffs into the lungs every 6 (six) hours as needed for wheezing or shortness of breath. 02/14/15  Yes Patrici Ranks, MD  buPROPion (WELLBUTRIN) 75 MG tablet Take 1  tablet (75 mg total) by mouth 3 (three) times daily. 02/29/16 02/28/17 Yes Cloria Spring, MD  clonazePAM (KLONOPIN) 1 MG tablet Take 1 tablet (1 mg total) by mouth 4 (four) times daily. 02/29/16 02/28/17 Yes Cloria Spring, MD  DULoxetine (CYMBALTA) 60 MG capsule Take 1 capsule (60 mg total) by mouth 2 (two) times daily. 02/29/16  Yes Cloria Spring, MD  fluticasone-salmeterol (ADVAIR HFA) [phone] MCG/ACT inhaler Inhale 2 puffs into the lungs 2 (two) times daily.   Yes Historical Provider, MD  omeprazole  (PRILOSEC) 20 MG capsule Take 20 mg by mouth daily.   Yes Historical Provider, MD  pravastatin (PRAVACHOL) 20 MG tablet Take 20 mg by mouth at bedtime.   Yes Historical Provider, MD  traZODone (DESYREL) 100 MG tablet Take one or two at bedtime 02/29/16  Yes Cloria Spring, MD    Family History Family History  Problem Relation Age of Onset  . Cancer Father     Social History Social History  Substance Use Topics  . Smoking status: Current Every Day Smoker    Packs/day: 0.50    Types: Cigarettes  . Smokeless tobacco: Never Used  . Alcohol use 3.6 oz/week    6 Shots of liquor per week     Comment: occ     Allergies   Vicodin [hydrocodone-acetaminophen]   Review of Systems Review of Systems  Constitutional: Negative for fever.  Respiratory: Negative for cough.   Gastrointestinal: Positive for abdominal pain and anal bleeding.  Neurological: Positive for dizziness. Negative for weakness.  All other systems reviewed and are negative.  Physical Exam Updated Vital Signs BP (!) 101/51   Pulse 77   Temp 98.7 F (37.1 C) (Oral)   Resp 24   Ht 5\' 5"  (1.651 m)   Wt 187 lb (84.8 kg)   LMP 07/09/2013   SpO2 96%   BMI 31.12 kg/m   Physical Exam  Constitutional: She is oriented to person, place, and time. She appears well-developed and well-nourished.  HENT:  Head: Normocephalic.  Eyes: EOM are normal.  Neck: Normal range of motion.  Pulmonary/Chest: Effort normal.  Abdominal: She exhibits no distension and no mass. There is tenderness. There is no rebound and no guarding.  Mild LLQ abdominal tenderness   Genitourinary:  Genitourinary Comments: There are 2 posterior anal fissures, which are not actively bleeding. On digital rectal examination, there is no stool in the rectal vault, and there is no blood on the examining finger. There is no palpable rectal mass, visible or palpable hemorrhoidal tissue.  Musculoskeletal: Normal range of motion.  Neurological: She is alert  and oriented to person, place, and time. Sensory deficit: .ewed.  Psychiatric: She has a normal mood and affect.  Nursing note and vitals reviewed.  ED Treatments / Results  Labs (all labs ordered are listed, but only abnormal results are displayed) Labs Reviewed  COMPREHENSIVE METABOLIC PANEL - Abnormal; Notable for the following:       Result Value   Glucose, Bld 102 (*)    All other components within normal limits  CBC  POC OCCULT BLOOD, ED  POC OCCULT BLOOD, ED  TYPE AND SCREEN    EKG  EKG Interpretation None       Radiology No results found.  Procedures Procedures (including critical care time)  Medications Ordered in ED Medications - No data to display  DIAGNOSTIC STUDIES:  Oxygen Saturation is 96% on RA, normal by my interpretation.    COORDINATION OF CARE:  1:06 PM Discussed treatment plan with pt at bedside and pt agreed to plan.  Initial Impression / Assessment and Plan / ED Course  I have reviewed the triage vital signs and the nursing notes.  Pertinent labs & imaging results that were available during my care of the patient were reviewed by me and considered in my medical decision making (see chart for details).  Clinical Course    Medications - No data to display  Patient Vitals for the past 24 hrs:  BP Temp Temp src Pulse Resp SpO2 Height Weight  03/20/16 1313 (!) 101/42 - - 77 22 95 % - -  03/20/16 1200 (!) 101/51 - - 77 24 96 % - -  03/20/16 1124 114/60 98.7 F (37.1 C) Oral 87 20 97 % - -  03/20/16 1123 - - - - - - 5\' 5"  (1.651 m) 187 lb (84.8 kg)    1:23 PM Reevaluation with update and discussion. After initial assessment and treatment, an updated evaluation reveals no change in clinical status. Findings discussed with patient and all questions answered. Keia Rask L    I personally performed the services described in this documentation, which was scribed in my presence. The recorded information has been reviewed and is accurate.    Final Clinical Impressions(s) / ED Diagnoses   Final diagnoses:  Anal fissure  Nausea vomiting and diarrhea    Nausea, vomiting, diarrhea, gradually improving with residual rectal bleeding, stooling, 2. Notable anal fissures are present, which likely explain the stool. Patient is generally nontoxic, and I doubt that she has an acute colitis, or intestinal bleeding source. Her bleeding appears to be external. Doubt serious bacterial infection. Metabolic instability or impending vascular collapse.  Nursing Notes Reviewed/ Care Coordinated Applicable Imaging Reviewed Interpretation of Laboratory Data incorporated into ED treatment  The patient appears reasonably screened and/or stabilized for discharge and I doubt any other medical condition or other [facility] requiring further screening, evaluation, or treatment in the ED at this time prior to discharge.  Plan: Home Medications- continue; Home Treatments- rest, warm soaks, anal care; return here if the recommended treatment, does not improve the symptoms; Recommended follow up- PCP prn    New Prescriptions New Prescriptions   No medications on file     Daleen Bo, MD 03/20/16 1335

## 2016-03-20 NOTE — Discharge Instructions (Signed)
Soak in a tub 2 or 3 times a day to keep the area around the anus clean.  Wipe with a soft towelette, such as a diaper wipe, after having a bowel movement.  Get plenty of rest and drink a lot of fluids.  Take Tylenol every 4 hours as needed for pain.

## 2016-03-20 NOTE — ED Triage Notes (Signed)
Pt seen at Urgent Care this am for GI bleeding. Pt states symptoms started on Saturday. Pt had been having emesis and diarrhea, lower abdominal cramping. Pt states on Sunday she had 2 episodes of rectal bleeding. Pt is a cancer pt.

## 2016-05-03 NOTE — Progress Notes (Signed)
Labs sent to PCP on file via EPIC.

## 2016-05-03 NOTE — Progress Notes (Signed)
Pt is aware of results. 

## 2016-05-25 ENCOUNTER — Telehealth (HOSPITAL_COMMUNITY): Payer: Self-pay | Admitting: *Deleted

## 2016-05-25 NOTE — Telephone Encounter (Signed)
Called pt to resch appt for 05-28-2016. lmtcb and office number provided.

## 2016-05-28 ENCOUNTER — Other Ambulatory Visit (HOSPITAL_COMMUNITY): Payer: Self-pay

## 2016-05-28 ENCOUNTER — Ambulatory Visit (HOSPITAL_COMMUNITY): Payer: Self-pay | Admitting: Psychiatry

## 2016-05-28 ENCOUNTER — Ambulatory Visit (HOSPITAL_COMMUNITY): Payer: Self-pay | Admitting: Hematology & Oncology

## 2016-05-29 ENCOUNTER — Other Ambulatory Visit (HOSPITAL_COMMUNITY): Payer: Self-pay | Admitting: *Deleted

## 2016-05-29 DIAGNOSIS — C50919 Malignant neoplasm of unspecified site of unspecified female breast: Secondary | ICD-10-CM

## 2016-05-30 ENCOUNTER — Ambulatory Visit (INDEPENDENT_AMBULATORY_CARE_PROVIDER_SITE_OTHER): Payer: Medicare Other | Admitting: Psychiatry

## 2016-05-30 ENCOUNTER — Ambulatory Visit (HOSPITAL_COMMUNITY)
Admission: RE | Admit: 2016-05-30 | Discharge: 2016-05-30 | Disposition: A | Discharge status: Home / Self Care | Payer: Medicare Other | Source: Ambulatory Visit | Attending: Hematology & Oncology | Admitting: Hematology & Oncology

## 2016-05-30 ENCOUNTER — Encounter (HOSPITAL_COMMUNITY): Payer: Self-pay | Admitting: Psychiatry

## 2016-05-30 ENCOUNTER — Encounter (HOSPITAL_COMMUNITY): Payer: Medicare Other | Attending: Hematology & Oncology | Admitting: Hematology & Oncology

## 2016-05-30 ENCOUNTER — Encounter (HOSPITAL_COMMUNITY): Payer: Medicare Other

## 2016-05-30 ENCOUNTER — Other Ambulatory Visit (HOSPITAL_COMMUNITY): Payer: Self-pay | Admitting: Hematology & Oncology

## 2016-05-30 ENCOUNTER — Encounter (HOSPITAL_COMMUNITY): Payer: Self-pay | Admitting: Hematology & Oncology

## 2016-05-30 VITALS — BP 131/63 | HR 80 | Temp 99.0°F | Resp 18 | Wt 180.3 lb

## 2016-05-30 VITALS — BP 104/62 | HR 84 | Ht 65.0 in | Wt 180.0 lb

## 2016-05-30 DIAGNOSIS — Z720 Tobacco use: Secondary | ICD-10-CM | POA: Diagnosis not present

## 2016-05-30 DIAGNOSIS — C50919 Malignant neoplasm of unspecified site of unspecified female breast: Secondary | ICD-10-CM

## 2016-05-30 DIAGNOSIS — Z853 Personal history of malignant neoplasm of breast: Secondary | ICD-10-CM

## 2016-05-30 DIAGNOSIS — Z888 Allergy status to other drugs, medicaments and biological substances status: Secondary | ICD-10-CM | POA: Diagnosis not present

## 2016-05-30 DIAGNOSIS — K219 Gastro-esophageal reflux disease without esophagitis: Secondary | ICD-10-CM

## 2016-05-30 DIAGNOSIS — F332 Major depressive disorder, recurrent severe without psychotic features: Secondary | ICD-10-CM

## 2016-05-30 DIAGNOSIS — R197 Diarrhea, unspecified: Secondary | ICD-10-CM | POA: Diagnosis not present

## 2016-05-30 DIAGNOSIS — Z808 Family history of malignant neoplasm of other organs or systems: Secondary | ICD-10-CM

## 2016-05-30 DIAGNOSIS — Z79899 Other long term (current) drug therapy: Secondary | ICD-10-CM

## 2016-05-30 DIAGNOSIS — M542 Cervicalgia: Secondary | ICD-10-CM | POA: Insufficient documentation

## 2016-05-30 DIAGNOSIS — R918 Other nonspecific abnormal finding of lung field: Secondary | ICD-10-CM | POA: Diagnosis not present

## 2016-05-30 DIAGNOSIS — R9389 Abnormal findings on diagnostic imaging of other specified body structures: Secondary | ICD-10-CM

## 2016-05-30 LAB — CBC WITH DIFFERENTIAL/PLATELET
Basophils Absolute: 0.1 10*3/uL (ref 0.0–0.1)
Basophils Relative: 1 %
EOS ABS: 0.2 10*3/uL (ref 0.0–0.7)
Eosinophils Relative: 2 %
HCT: 44 % (ref 36.0–46.0)
HEMOGLOBIN: 14.8 g/dL (ref 12.0–15.0)
LYMPHS ABS: 3.6 10*3/uL (ref 0.7–4.0)
LYMPHS PCT: 39 %
MCH: 30.7 pg (ref 26.0–34.0)
MCHC: 33.6 g/dL (ref 30.0–36.0)
MCV: 91.3 fL (ref 78.0–100.0)
Monocytes Absolute: 0.6 10*3/uL (ref 0.1–1.0)
Monocytes Relative: 7 %
NEUTROS PCT: 51 %
Neutro Abs: 4.9 10*3/uL (ref 1.7–7.7)
Platelets: 311 10*3/uL (ref 150–400)
RBC: 4.82 MIL/uL (ref 3.87–5.11)
RDW: 13.6 % (ref 11.5–15.5)
WBC: 9.3 10*3/uL (ref 4.0–10.5)

## 2016-05-30 LAB — COMPREHENSIVE METABOLIC PANEL
ALK PHOS: 57 U/L (ref 38–126)
ALT: 21 U/L (ref 14–54)
AST: 20 U/L (ref 15–41)
Albumin: 4.5 g/dL (ref 3.5–5.0)
Anion gap: 9 (ref 5–15)
BUN: 9 mg/dL (ref 6–20)
CALCIUM: 9.5 mg/dL (ref 8.9–10.3)
CO2: 27 mmol/L (ref 22–32)
CREATININE: 0.59 mg/dL (ref 0.44–1.00)
Chloride: 101 mmol/L (ref 101–111)
GFR calc non Af Amer: >60 mL/min (ref >60)
GLUCOSE: 98 mg/dL (ref 65–99)
Potassium: 4 mmol/L (ref 3.5–5.1)
SODIUM: 137 mmol/L (ref 135–145)
Total Bilirubin: 0.5 mg/dL (ref 0.3–1.2)
Total Protein: 7.9 g/dL (ref 6.5–8.1)

## 2016-05-30 MED ORDER — VARENICLINE TARTRATE 0.5 MG PO TABS: 0.5000 mg | ORAL_TABLET | Freq: Two times a day (BID) | ORAL | 0 refills | Status: DC

## 2016-05-30 MED ORDER — VARENICLINE TARTRATE 1 MG PO TABS: 1.0000 mg | ORAL_TABLET | Freq: Two times a day (BID) | ORAL | 2 refills | Status: DC

## 2016-05-30 MED ORDER — DULOXETINE HCL 60 MG PO CPEP: 60.0000 mg | ORAL_CAPSULE | Freq: Two times a day (BID) | ORAL | 2 refills | Status: DC

## 2016-05-30 MED ORDER — CLONAZEPAM 1 MG PO TABS: 1.0000 mg | ORAL_TABLET | Freq: Four times a day (QID) | ORAL | 2 refills | Status: DC

## 2016-05-30 MED ORDER — TRAZODONE HCL 100 MG PO TABS: 200.0000 mg | ORAL_TABLET | Freq: Every day | ORAL | 2 refills | Status: DC
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
## 2016-05-30 NOTE — Patient Instructions (Signed)
[place] at [facility] Discharge Instructions  RECOMMENDATIONS MADE BY THE CONSULTANT AND ANY TEST RESULTS WILL BE SENT TO YOUR REFERRING PHYSICIAN.  You were seen today by Dr. Youlanda Roys will have an x-ray of your neck today, we will call you with the results Follow up in 6 weeks with Elzie Rings for smoking cessation Follow up in 1 year with Dr. Whitney Muse We will refer you to Dr. Oneida Alar for colonoscopy  Thank you for choosing [place] at [facility] to provide your oncology and hematology care.  To afford each patient quality time with our provider, please arrive at least 15 minutes before your scheduled appointment time.    If you have a lab appointment with the [facility] please come in thru the  Main Entrance and check in at the main information desk  You need to re-schedule your appointment should you arrive 10 or more minutes late.  We strive to give you quality time with our providers, and arriving late affects you and other patients whose appointments are after yours.  Also, if you no show three or more times for appointments you may be dismissed from the clinic at the providers discretion.     Again, thank you for choosing [facility].  Our hope is that these requests will decrease the amount of time that you wait before being seen by our physicians.       _____________________________________________________________  Should you have questions after your visit to [facility], please contact our office at [phone] between the hours of 8:30 a.m. and 4:30 p.m.  Voicemails left after 4:30 p.m. will not be returned until the following business day.  For prescription refill requests, have your pharmacy contact our office.       Resources For Cancer Patients and their Caregivers ? American Cancer Society: Can assist with transportation, wigs, general needs, runs Look Good Feel Better.         (613) 589-6247 ? Cancer Care: Provides financial assistance, online support groups, medication/co-pay assistance.  1-800-813-HOPE 939-470-1340) ? [facility] Assists [place] cancer patients and their families through emotional , educational and financial support.  442-813-8329 ? [place] DSS Where to apply for food stamps, Medicaid and utility assistance. 303-375-3897 ? [facility]: Transportation to medical appointments. (314)454-1823 ? Social Security Administration: May apply for disability if have a Stage IV cancer. (514)478-8190 9391853440 ? [employer], Disability and Transit Services: Assists with nutrition, care and transit needs. [place] Support Programs: @10RELATIVEDAYS @ > Cancer Support Group  2nd Tuesday of the month 1pm-2pm, Journey Room  > Creative Journey  3rd Tuesday of the month 1130am-1pm, Journey Room  > Look Good Feel Better  1st Wednesday of the month 10am-12 noon, Journey Room (Call [place] to register [phone])

## 2016-05-30 NOTE — Progress Notes (Signed)
PROGRESS NOTE  Rogers Blocker, MD No primary provider on file.  No diagnosis found.  CURRENT THERAPY: Observation  INTERVAL HISTORY: [name] 56 y.o. female returns for  regular  visit for followup of  Stage II (T1c N1 M0) grade 3 triple negative breast cancer, right- sided with 1 of 5 positive nodes, status post FEC in a dose dense fashion for 6 cycles followed by radiation therapy by Dr. Sondra Come for her high-risk disease with her initial date of surgery in February 2008.  From an oncology standpoint, [name] denies any complaints and ROS questioning is negative.   [name] currently smokes 1/2 ppd, down from a previous amount of 1 ppd.   [name] is today unaccompanied. [name] has been doing well.   [name] says [name] was told that her blood work showed a problem in her thyroid. [name] is going to see her new PCP, Dr. Meda Coffee for this.   [name] has some stiffness and pain in her neck when her [name] turns her head to the left or right.   [name] has only been eating once a day because [name] has no appetite. [name] says this is normal for her.   [name] has a lot of diarrhea. At one point, [name] started bleeding. [name] went to the doctor and was told that there was a tear and [name] needs to go get a colonoscopy. [name] still has some blood in her stool. [name] notes that [name] will let Dr. Meda Coffee refer her for a C-scope.   [name] is still smoking. [name] has tried patches, gum, and everything else to quit, but hasn't been successful. [name] would like to try Chantix. Her right breast did not heal well after reconstruction and the implant is now gone. [name] wants to have a breast reconstruction in her right breast, but Dr. Luetta Nutting says he won't do it until [name] quits smoking.   Past Medical History:  Diagnosis Date  . Breast cancer ([place])   . Breast cancer ([place]) 04/21/2012   Stage II (T1c N1 M0) grade 3 triple negative breast cancer, right- sided with 1 of 5 positive nodes, status post FEC in a dose dense fashion for 6 cycles followed by  radiation therapy by Dr. Sondra Come for her high-risk disease with her initial date of surgery in February 2008.   [name] COPD (chronic obstructive pulmonary disease) ([employer])   . Depression   . GERD (gastroesophageal reflux disease) 12/18/2012  . Headache   . Neuropathy ([place])   . Osteoporosis   . PTSD (post-traumatic stress disorder)     has Breast cancer ([place]); Tobacco abuse; GERD (gastroesophageal reflux disease); Frozen shoulder syndrome; Pain in joint, shoulder region; Decreased range of motion of shoulder; and Major depression on her problem list.     is allergic to vicodin [hydrocodone-acetaminophen].  [name] had no medications administered during this visit.  Past Surgical History:  Procedure Laterality Date  . BREAST SURGERY    . ESOPHAGOGASTRODUODENOSCOPY  03/10/2008   SLF: Normal esophagus without evidence of Barrett, mass, erosion  ulceration, or stricture  . leocolonoscopy  03/10/2008   [id number] terminal ileum, approximately 10 cm visualized/Normal colon without evidence of polyps, mass, inflammatory changes, diverticula, or AVMs/Normal retroflexed view of the rectum/Patchy erythema in the antrum without erosion or ulceration  . MASTECTOMY     bilateral  . TUBAL LIGATION      Review of Systems  Constitutional: Negative.   HENT: Negative.   Eyes: Negative.   Respiratory: Negative.   Cardiovascular:  Negative.   Gastrointestinal: Negative.   Genitourinary: Negative.   Musculoskeletal: Negative.   Skin: Negative.   Neurological: Negative.   Endo/Heme/Allergies: Negative.   Psychiatric/Behavioral: Negative.   All other systems reviewed and are negative. 14 point review of systems was performed and is negative except as detailed under history of present illness and above   PHYSICAL EXAMINATION  ECOG PERFORMANCE STATUS: 0 - Asymptomatic  Vitals:   05/30/16 1311  BP: 131/63  Pulse: 80  Resp: 18  Temp: 99 F (37.2 C)   Physical Exam  Constitutional: [name] is  oriented to person, place, and time and well-developed, well-nourished, and in no distress.  [name] was able to get on exam table without assistance.  Wears glasses.   HENT:  Head: Normocephalic and atraumatic.  Eyes: EOM are normal. Pupils are equal, round, and reactive to light.  Neck: Normal range of motion. Neck supple.  Cardiovascular: Normal rate, regular rhythm and normal heart sounds.   Pulmonary/Chest: Effort normal and breath sounds normal.    Abdominal: Soft. Bowel sounds are normal.  Musculoskeletal: Normal range of motion.  Neurological: [name] is alert and oriented to person, place, and time. Gait normal.  Skin: Skin is warm and dry.  Nursing note and vitals reviewed.  ASSESSMENT:  1. Stage II (T1c N1 M0) grade 3 triple negative breast cancer, right- sided with 1 of 5 positive nodes, status post FEC in a dose dense fashion for 6 cycles followed by radiation therapy by Dr. Sondra Come for her high-risk disease with her initial date of surgery in February 2008. 2. GERD, recent rectal bleeding 3. Tobacco abuse, still smoking 1/2-1 ppd  Patient Active Problem List   Diagnosis Date Noted  . Major depression 02/05/2014  . Pain in joint, shoulder region 07/23/2013  . Decreased range of motion of shoulder 07/23/2013  . 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 Tobacco abuse 12/18/2012  . GERD (gastroesophageal reflux disease) 12/18/2012  . Breast cancer ([place]) 04/21/2012    PLAN:  1. Smoking cessation education provided 2. Patient education regarding self-breast exams 3. Return in 1 year for follow-up   I discussed the importance of smoking cessation in regards to reducing cancer risk, vascular disease, and significant pulmonary disease. I have referred her to [facility] in 6 weeks here at [facility] for smoking cessation.  [name] has a new patient appointment with Dr. Meda Coffee. [name] wishes to have her refer her to GI for colonoscopy. I emphasized the importance of screening C-scope starting at age  24.  Plain films of her neck today.  Oncologically, the patient is doing well.  We will see her back in 1 year for follow-up, sooner if needed.  Orders Placed This Encounter  Procedures  . DG Cervical Spine 2 or 3 views    Standing Status:   Future    Number of Occurrences:   1    Standing Expiration Date:   07/28/2017    Order Specific Question:   Reason for Exam (SYMPTOM  OR DIAGNOSIS REQUIRED)    Answer:   neck pain    Order Specific Question:   Is patient pregnant?    Answer:   No    Order Specific Question:   Preferred imaging location?    Answer:   [facility]    All questions were answered. The patient knows to call the clinic with any problems, questions or concerns. We can certainly see the patient much sooner if necessary.  This document serves as a record of  services personally performed by Ancil Linsey, MD. It was created on her behalf by Martinique Casey, a trained medical scribe. The creation of this record is based on the scribe's personal observations and the provider's statements to them. This document has been checked and approved by the attending provider.  I have reviewed the above documentation for accuracy and completeness, and I agree with the above.  Kelby Fam. Whitney Muse, MD

## 2016-05-31 ENCOUNTER — Telehealth (HOSPITAL_COMMUNITY): Payer: Self-pay | Admitting: *Deleted

## 2016-05-31 LAB — CANCER ANTIGEN 27.29: CA 27.29: 16.4 U/mL (ref 0.0–38.6)

## 2016-05-31 NOTE — Telephone Encounter (Signed)
[name] will fax regarding prior authorization.

## 2016-06-04 NOTE — Telephone Encounter (Signed)
noted 

## 2016-06-06 ENCOUNTER — Telehealth (HOSPITAL_COMMUNITY): Payer: Self-pay | Admitting: *Deleted

## 2016-06-06 NOTE — Telephone Encounter (Signed)
Noted. Called pt and informed pt form was completed and faxed to [facility] per her verbal request. Pt verbalized understanding.

## 2016-06-06 NOTE — Telephone Encounter (Signed)
voice message from [place], would like someone to call regarding prior authorization for [place].

## 2016-06-06 NOTE — Telephone Encounter (Signed)
done

## 2016-06-06 NOTE — Telephone Encounter (Signed)
Received letter form [employer] requesting information on Chantix. Called [phone] spoke with Thomasena Edis who gave approval from 05/07/16-12/03/16.

## 2016-06-06 NOTE — Telephone Encounter (Signed)
Pt called stating she would like for Dr. Harrington Challenger to please fill out the form that [employer] is wanting filled out for her Chantix. Per pt she changed PCP and Dr. Harrington Challenger started her on it. Per pt she really need this Chantix in order to stop smoking for her to get her surgery. Form was placed in providers box. [employer] number is [phone] and pt case number is [id number]. Pt number is [phone].

## 2016-06-08 ENCOUNTER — Telehealth (HOSPITAL_COMMUNITY): Payer: Self-pay | Admitting: *Deleted

## 2016-06-08 NOTE — Telephone Encounter (Signed)
[facility] script faxed approval for pt Chantix from 05-07-2016 until 12-03-2016.

## 2016-06-12 ENCOUNTER — Telehealth (HOSPITAL_COMMUNITY): Payer: Self-pay

## 2016-06-12 ENCOUNTER — Encounter (HOSPITAL_COMMUNITY): Payer: Self-pay | Admitting: Hematology & Oncology

## 2016-06-12 NOTE — Telephone Encounter (Signed)
Called patient and notified her she needed to come for a CXR. She verbalized understanding.

## 2016-06-12 NOTE — Telephone Encounter (Signed)
-----   Message from Patrici Ranks, MD sent at 06/12/2016 11:27 AM EST ----- Please make sure patient comes in for CXR, especially given smoking history. Orders are in. Dr.P

## 2016-06-13 ENCOUNTER — Other Ambulatory Visit (HOSPITAL_COMMUNITY): Payer: Self-pay | Admitting: Oncology

## 2016-06-13 DIAGNOSIS — R0989 Other specified symptoms and signs involving the circulatory and respiratory systems: Secondary | ICD-10-CM

## 2016-06-21 ENCOUNTER — Telehealth: Payer: Self-pay

## 2016-06-21 ENCOUNTER — Encounter: Payer: Self-pay | Admitting: Family Medicine

## 2016-06-21 ENCOUNTER — Ambulatory Visit (INDEPENDENT_AMBULATORY_CARE_PROVIDER_SITE_OTHER): Payer: Medicare Other | Admitting: Family Medicine

## 2016-06-21 ENCOUNTER — Ambulatory Visit (HOSPITAL_COMMUNITY)
Admission: RE | Admit: 2016-06-21 | Discharge: 2016-06-21 | Disposition: A | Discharge status: Home / Self Care | Payer: Medicare Other | Source: Ambulatory Visit | Attending: Oncology | Admitting: Oncology

## 2016-06-21 VITALS — BP 118/72 | HR 84 | Temp 98.2°F | Resp 18 | Ht 64.0 in | Wt 182.1 lb

## 2016-06-21 DIAGNOSIS — K130 Diseases of lips: Secondary | ICD-10-CM

## 2016-06-21 DIAGNOSIS — F4310 Post-traumatic stress disorder, unspecified: Secondary | ICD-10-CM | POA: Diagnosis not present

## 2016-06-21 DIAGNOSIS — R0989 Other specified symptoms and signs involving the circulatory and respiratory systems: Secondary | ICD-10-CM | POA: Diagnosis present

## 2016-06-21 DIAGNOSIS — C50919 Malignant neoplasm of unspecified site of unspecified female breast: Secondary | ICD-10-CM | POA: Diagnosis not present

## 2016-06-21 DIAGNOSIS — Z7689 Persons encountering health services in other specified circumstances: Secondary | ICD-10-CM | POA: Diagnosis not present

## 2016-06-21 DIAGNOSIS — Z720 Tobacco use: Secondary | ICD-10-CM | POA: Diagnosis not present

## 2016-06-21 DIAGNOSIS — E559 Vitamin D deficiency, unspecified: Secondary | ICD-10-CM | POA: Diagnosis not present

## 2016-06-21 DIAGNOSIS — T451X5A Adverse effect of antineoplastic and immunosuppressive drugs, initial encounter: Secondary | ICD-10-CM | POA: Diagnosis not present

## 2016-06-21 DIAGNOSIS — J449 Chronic obstructive pulmonary disease, unspecified: Secondary | ICD-10-CM

## 2016-06-21 DIAGNOSIS — Z1211 Encounter for screening for malignant neoplasm of colon: Secondary | ICD-10-CM | POA: Diagnosis not present

## 2016-06-21 DIAGNOSIS — E785 Hyperlipidemia, unspecified: Secondary | ICD-10-CM

## 2016-06-21 DIAGNOSIS — G620 Drug-induced polyneuropathy: Secondary | ICD-10-CM

## 2016-06-21 HISTORY — DX: Drug-induced polyneuropathy: G62.0

## 2016-06-21 HISTORY — DX: Drug-induced polyneuropathy: T45.1X5A

## 2016-06-21 MED ORDER — PRAVASTATIN SODIUM 20 MG PO TABS: 20.0000 mg | ORAL_TABLET | Freq: Every day | ORAL | 3 refills | Status: DC

## 2016-06-21 NOTE — Patient Instructions (Addendum)
Don't but any more cigarettes It is important that you quit  You have been referred for a colonscopy Please call for this appointment  Need old records  See me in a month  Need blood test for cholesterol

## 2016-06-21 NOTE — Telephone Encounter (Signed)
[phone]  Patient received letter to schedule tcs

## 2016-06-21 NOTE — Progress Notes (Signed)
Chief Complaint  Patient presents with  . Establish Care   Here new to establish Under care of cancer center for history breast cancer. She sees psychiatry for history of major depression, anxiety and PTSD.   I have discussed the multiple health risks associated with cigarette smoking including, but not limited to, cardiovascular disease, lung disease and cancer.  I have strongly recommended that smoking be stopped.  I have reviewed the various methods of quitting including cold [place], classes, nicotine replacements and prescription medications.  I have offered assistance in this difficult process.  She is on chantix from her psychiatrist.  She wants "something stronger".  I emphasized to her that there is a single dose, and she is on it.  The rest is up to her.  She needs to JUST DO IT, put down the cigarettes.  Needs to engage her boyfriend in this effort and tell him to support her, and not give/buy her cigarettes. We discussed that 1. She already has COPD, 2. She already had cancer, 3. She has family history cancer - multiple motivating factors.   Patient Active Problem List   Diagnosis Date Noted  . Chemotherapy-induced neuropathy ([place]) 06/21/2016  . COPD mixed type ([place]) 06/21/2016  . HLD (hyperlipidemia) 06/21/2016  . Vitamin D deficiency 06/21/2016  . Post traumatic stress disorder (PTSD) 06/21/2016  . Major depression 02/05/2014  . Tobacco abuse 12/18/2012  . GERD (gastroesophageal reflux disease) 12/18/2012  . Breast cancer ([place]) 04/21/2012    Outpatient Encounter Prescriptions as of 06/21/2016  Medication Sig  . albuterol (PROVENTIL HFA;VENTOLIN HFA) 108 (90 BASE) MCG/ACT inhaler Inhale 2 puffs into the lungs every 6 (six) hours as needed for wheezing or shortness of breath.  . clonazePAM (KLONOPIN) 1 MG tablet Take 1 tablet (1 mg total) by mouth 4 (four) times daily.  . DOCOSAHEXAENOIC ACID PO Take 1 g by mouth.  . DULoxetine (CYMBALTA) 60 MG capsule Take 1 capsule (60 mg  total) by mouth 2 (two) times daily.  . fluticasone-salmeterol (ADVAIR HFA) 115-21 MCG/ACT inhaler Inhale 2 puffs into the lungs 2 (two) times daily.  [name] omeprazole (PRILOSEC) 20 MG capsule Take 20 mg by mouth daily.  . pravastatin (PRAVACHOL) 20 MG tablet Take 1 tablet (20 mg total) by mouth at bedtime.  . traZODone (DESYREL) 100 MG tablet Take 2 tablets (200 mg total) by mouth at bedtime.  . varenicline (CHANTIX) 0.5 MG tablet Take 1 tablet (0.5 mg total) by mouth 2 (two) times daily.  [name] buPROPion (WELLBUTRIN) 75 MG tablet   . varenicline (CHANTIX CONTINUING MONTH PAK) 1 MG tablet Take 1 tablet (1 mg total) by mouth 2 (two) times daily. (Patient not taking: Reported on 06/21/2016)   No facility-administered encounter medications on file as of 06/21/2016.     Past Medical History:  Diagnosis Date  . Allergy   . Anxiety   . Blood transfusion without reported diagnosis   . Breast cancer ([place])   . Breast cancer ([place]) 04/21/2012   Stage II (T1c N1 M0) grade 3 triple negative breast cancer, right- sided with 1 of 5 positive nodes, status post FEC in a dose dense fashion for 6 cycles followed by radiation therapy by Dr. Sondra Come for her high-risk disease with her initial date of surgery in February 2008.   [name] Chemotherapy-induced neuropathy ([place]) 06/21/2016  . COPD (chronic obstructive pulmonary disease) ([place])   . Depression   . Emphysema of lung ([place])   . GERD (gastroesophageal reflux disease) 12/18/2012  .  Headache   . Hyperlipidemia   . Neuropathy ([place])   . Osteoporosis   . PONV (postoperative nausea and vomiting)   . PTSD (post-traumatic stress disorder)   . Substance abuse     Past Surgical History:  Procedure Laterality Date  . BREAST SURGERY    . ESOPHAGOGASTRODUODENOSCOPY  03/10/2008   SLF: Normal esophagus without evidence of Barrett, mass, erosion  ulceration, or stricture  . leocolonoscopy  03/10/2008   [id number] terminal ileum, approximately 10 cm visualized/Normal colon  without evidence of polyps, mass, inflammatory changes, diverticula, or AVMs/Normal retroflexed view of the rectum/Patchy erythema in the antrum without erosion or ulceration  . MASTECTOMY     bilateral  . TUBAL LIGATION      Social History   Social History  . Marital status: Divorced    Spouse name: N/A  . Number of children: 2  . Years of education: 13   Occupational History  . disabled     neuropathy from chemo   Social History Main Topics  . Smoking status: Current Every Day Smoker    Packs/day: 0.50    Types: Cigarettes    Start date: 05/07/1992  . Smokeless tobacco: Never Used     Comment: discussed  . Alcohol use 3.6 oz/week    6 Shots of liquor per week     Comment: occ  . Drug use: No  . Sexual activity: Yes    Birth control/ protection: Post-menopausal   Other Topics Concern  . Not on file   Social History Narrative   Divorced   Lives alone   Spends time with boyfriend - he is a smoker    Family History  Problem Relation Age of Onset  . COPD Mother   . Depression Mother   . Heart disease Mother   . Hyperlipidemia Mother   . Hypertension Mother   . Cancer Mother     lung cancer, to bone  . Cancer Father     skin  . Heart disease Father   . Hypertension Father   . Hyperlipidemia Father   . Learning disabilities Father   . Drug abuse Brother     overdose at 79  . Drug abuse Daughter     drug overdose 26  . Cancer Maternal Uncle     lung cancer    Review of Systems  Constitutional: Negative for chills, fever and weight loss.  HENT: Negative for congestion and hearing loss.   Eyes: Negative for blurred vision and pain.  Respiratory: Positive for cough and shortness of breath.   Cardiovascular: Negative for chest pain and leg swelling.  Gastrointestinal: Negative for abdominal pain, constipation, diarrhea and heartburn.  Genitourinary: Negative for dysuria and frequency.  Musculoskeletal: Negative for falls, joint pain and myalgias.    Neurological: Positive for tingling. Negative for dizziness, seizures and headaches.       Numbness and tingling hands and feet  Psychiatric/Behavioral: Positive for depression. The patient is nervous/anxious and has insomnia.        Under care of psych   BP 118/72 (BP Location: Left Arm, Patient Position: Sitting, Cuff Size: Normal)   Pulse 84   Temp 98.2 F (36.8 C) (Temporal)   Resp 18   Ht 5\' 4"  (1.626 m)   Wt 182 lb 1.3 oz (82.6 kg)   LMP 07/09/2013   SpO2 95%   BMI 31.25 kg/m   Physical Exam  Constitutional: She is oriented to person, place, and time. She appears well-developed and  well-nourished.  Overweight.  Smells of tobacco.  Slightly anxious  HENT:  Head: Normocephalic and atraumatic.  Mouth/Throat: Oropharynx is clear and moist.  cheilitis corners of mouth  Eyes: Conjunctivae are normal. Pupils are equal, round, and reactive to light.  Neck: Normal range of motion. No thyromegaly present.  Cardiovascular: Normal rate, regular rhythm and normal heart sounds.   Pulmonary/Chest: Effort normal. She has wheezes.  Abdominal: Soft. Bowel sounds are normal.  Musculoskeletal: She exhibits no edema.  Lymphadenopathy:    She has no cervical adenopathy.  Neurological: She is alert and oriented to person, place, and time.   ASSESSMENT/PLAN:  1. Chemotherapy-induced neuropathy ([place])   2. Screening for colon cancer   3. Tobacco abuse   4. Malignant neoplasm of female breast, unspecified estrogen receptor status, unspecified laterality, unspecified site of breast ([place])   5. Encounter to establish care with new doctor   6. COPD mixed type ([place])  - CBC - Comprehensive metabolic panel - Lipid panel - Urinalysis, Routine w reflex microscopic - VITAMIN D 25 Hydroxy (Vit-D Deficiency, Fractures)  7. Hyperlipidemia, unspecified hyperlipidemia type  - Lipid panel  8. Vitamin D deficiency  - VITAMIN D 25 Hydroxy (Vit-D Deficiency, Fractures)  9. Post traumatic  stress disorder (PTSD)    Patient Instructions  Don't but any more cigarettes It is important that you quit  You have been referred for a colonscopy Please call for this appointment  Need old records  See me in a month  Need blood test for cholesterol       Raylene Everts, MD

## 2016-06-28 NOTE — Telephone Encounter (Signed)
LMOM to call.

## 2016-07-02 ENCOUNTER — Telehealth: Payer: Self-pay

## 2016-07-02 NOTE — Telephone Encounter (Signed)
PT has been scheduled an OV with Neil Crouch, PA on 08/02/2016 at 1:30 pm.

## 2016-07-02 NOTE — Telephone Encounter (Signed)
Opened in error

## 2016-07-02 NOTE — Telephone Encounter (Signed)
Pt was referred by Dr. Whitney Muse for a colonoscopy. She is on our Recall for 04/2018 ( Dr. Oneida Alar).  She is having some rectal bleeding on occasions and some diarrhea. Scheduled for some surgery on right breast on 07/24/2016. Wanted this appt to be somewhat later. She has OV with [place] on 08/02/2016 at 1:30 pm with Neil Crouch, PA. She will call if she has worsening problems prior to appt.   Sending FYI to Neil Crouch, PA.

## 2016-07-02 NOTE — Telephone Encounter (Signed)
Pt left Vm that she was trying to schedule colonoscopy. I returned her call and left Vm for a return call.

## 2016-07-02 NOTE — Telephone Encounter (Signed)
Pt called for DS. I told her that DS was with another patient and I could take a message or transfer her to VM. Pt said that she has already left messages. I told her that I would let DS know that she has called back and she can return her call. Please call [phone]

## 2016-07-03 NOTE — Telephone Encounter (Signed)
Is patient satisfied with appointment in late 07/2016? If not we could try to get her in earlier, but it sounds like she wants to come after her surgery.   H/H is normal. Sent for screening TCS. Needs OV because of problems.

## 2016-07-03 NOTE — Telephone Encounter (Signed)
Yes, pt wanted it this way.

## 2016-07-11 ENCOUNTER — Ambulatory Visit (HOSPITAL_COMMUNITY): Payer: Self-pay | Admitting: Adult Health

## 2016-07-19 ENCOUNTER — Ambulatory Visit (INDEPENDENT_AMBULATORY_CARE_PROVIDER_SITE_OTHER): Payer: Medicare Other | Admitting: Family Medicine

## 2016-07-19 ENCOUNTER — Encounter: Payer: Self-pay | Admitting: Family Medicine

## 2016-07-19 VITALS — BP 112/68 | HR 84 | Temp 98.7°F | Resp 18 | Ht 64.0 in | Wt 186.1 lb

## 2016-07-19 DIAGNOSIS — Z720 Tobacco use: Secondary | ICD-10-CM | POA: Diagnosis not present

## 2016-07-19 DIAGNOSIS — L219 Seborrheic dermatitis, unspecified: Secondary | ICD-10-CM | POA: Diagnosis not present

## 2016-07-19 MED ORDER — TRIAMCINOLONE ACETONIDE 0.1 % EX CREA: 1.0000 "application " | TOPICAL_CREAM | Freq: Two times a day (BID) | CUTANEOUS | 1 refills | Status: DC

## 2016-07-19 NOTE — Progress Notes (Signed)
Chief Complaint  Patient presents with  . Follow-up    1 month   Discussed recent labs from oncology Jan.  Has not gotten the ones I ordered for her. Is doing well Has stopped smoking - rarely will "bum" one off her BF, but not every day Lungs and breathing are normal Is scheduled for her breast surgery and is excited to get this done Not walking/exercising due to weather, but intends to   Patient Active Problem List   Diagnosis Date Noted  . Chemotherapy-induced neuropathy ([place]) 06/21/2016  . COPD mixed type ([place]) 06/21/2016  . HLD (hyperlipidemia) 06/21/2016  . Vitamin D deficiency 06/21/2016  . Post traumatic stress disorder (PTSD) 06/21/2016  . Angular cheilitis 06/21/2016  . Major depression 02/05/2014  . Tobacco abuse 12/18/2012  . GERD (gastroesophageal reflux disease) 12/18/2012  . Breast cancer ([place]) 04/21/2012    Outpatient Encounter Prescriptions as of 07/19/2016  Medication Sig  . albuterol (PROVENTIL HFA;VENTOLIN HFA) 108 (90 BASE) MCG/ACT inhaler Inhale 2 puffs into the lungs every 6 (six) hours as needed for wheezing or shortness of breath.  [name] buPROPion (WELLBUTRIN) 75 MG tablet   . clonazePAM (KLONOPIN) 1 MG tablet Take 1 tablet (1 mg total) by mouth 4 (four) times daily.  . DOCOSAHEXAENOIC ACID PO Take 1 g by mouth.  . DULoxetine (CYMBALTA) 60 MG capsule Take 1 capsule (60 mg total) by mouth 2 (two) times daily.  . fluticasone-salmeterol (ADVAIR HFA) 115-21 MCG/ACT inhaler Inhale 2 puffs into the lungs 2 (two) times daily.  [name] omeprazole (PRILOSEC) 20 MG capsule Take 20 mg by mouth daily.  . pravastatin (PRAVACHOL) 20 MG tablet Take 1 tablet (20 mg total) by mouth at bedtime.  . traZODone (DESYREL) 100 MG tablet Take 2 tablets (200 mg total) by mouth at bedtime.  . varenicline (CHANTIX CONTINUING MONTH PAK) 1 MG tablet Take 1 tablet (1 mg total) by mouth 2 (two) times daily.  . [DISCONTINUED] varenicline (CHANTIX) 0.5 MG tablet Take 1 tablet (0.5 mg total) by  mouth 2 (two) times daily.  [name] triamcinolone cream (KENALOG) 0.1 % Apply 1 application topically 2 (two) times daily. Until rash clears   No facility-administered encounter medications on file as of 07/19/2016.     Past Medical History:  Diagnosis Date  . Allergy   . Anxiety   . Blood transfusion without reported diagnosis   . Breast cancer ([place])   . Breast cancer ([place]) 04/21/2012   Stage II (T1c N1 M0) grade 3 triple negative breast cancer, right- sided with 1 of 5 positive nodes, status post FEC in a dose dense fashion for 6 cycles followed by radiation therapy by Dr. Sondra Come for her high-risk disease with her initial date of surgery in February 2008.   [name] Chemotherapy-induced neuropathy ([place]) 06/21/2016  . COPD (chronic obstructive pulmonary disease) ([place])   . Depression   . Emphysema of lung ([place])   . GERD (gastroesophageal reflux disease) 12/18/2012  . Headache   . Hyperlipidemia   . Neuropathy ([place])   . Osteoporosis   . PONV (postoperative nausea and vomiting)   . PTSD (post-traumatic stress disorder)   . Substance abuse     Past Surgical History:  Procedure Laterality Date  . BREAST SURGERY    . ESOPHAGOGASTRODUODENOSCOPY  03/10/2008   SLF: Normal esophagus without evidence of Barrett, mass, erosion  ulceration, or stricture  . leocolonoscopy  03/10/2008   WUJ:WJXBJY terminal ileum, approximately 10 cm visualized/Normal colon without evidence of polyps, mass, inflammatory  changes, diverticula, or AVMs/Normal retroflexed view of the rectum/Patchy erythema in the antrum without erosion or ulceration  . MASTECTOMY     bilateral  . TUBAL LIGATION      Social History   Social History  . Marital status: Divorced    Spouse name: N/A  . Number of children: 2  . Years of education: 13   Occupational History  . disabled     neuropathy from chemo   Social History Main Topics  . Smoking status: Current Every Day Smoker    Packs/day: 0.50    Types: Cigarettes    Start  date: 05/07/1992  . Smokeless tobacco: Never Used     Comment: discussed  . Alcohol use 3.6 oz/week    6 Shots of liquor per week     Comment: occ  . Drug use: No  . Sexual activity: Yes    Birth control/ protection: Post-menopausal   Other Topics Concern  . Not on file   Social History Narrative   Divorced   Lives alone   Spends time with boyfriend - he is a smoker    Family History  Problem Relation Age of Onset  . COPD Mother   . Depression Mother   . Heart disease Mother   . Hyperlipidemia Mother   . Hypertension Mother   . Cancer Mother     lung cancer, to bone  . Cancer Father     skin  . Heart disease Father   . Hypertension Father   . Hyperlipidemia Father   . Learning disabilities Father   . Drug abuse Brother     overdose at 28  . Drug abuse Daughter     drug overdose 72  . Cancer Maternal Uncle     lung cancer    Review of Systems  Constitutional: Positive for malaise/fatigue. Negative for weight loss.       Tired often  HENT: Negative for congestion and hearing loss.   Eyes: Negative for blurred vision and redness.  Respiratory: Negative for cough and sputum production.   Cardiovascular: Negative for chest pain and leg swelling.  Gastrointestinal: Negative for abdominal pain and heartburn.  Genitourinary: Negative for dysuria, frequency and urgency.  Skin: Positive for rash.       Red patches face/forehead  Neurological: Negative for dizziness and headaches.  Psychiatric/Behavioral: Negative for depression. The patient is not nervous/anxious.        On medicine    BP 112/68 (BP Location: Left Arm, Patient Position: Sitting, Cuff Size: Normal)   Pulse 84   Temp 98.7 F (37.1 C) (Temporal)   Resp 18   Ht 5\' 4"  (1.626 m)   Wt 186 lb 1.3 oz (84.4 kg)   LMP 07/09/2013   SpO2 95%   BMI 31.94 kg/m   Physical Exam  Constitutional: She is oriented to person, place, and time. She appears well-developed and well-nourished. No distress.  HENT:    Head: Normocephalic and atraumatic.  Mouth/Throat: Oropharynx is clear and moist.  Eyes: Conjunctivae are normal. Pupils are equal, round, and reactive to light.  Neck: Normal range of motion.  Cardiovascular: Normal rate and regular rhythm.   Pulmonary/Chest: Effort normal and breath sounds normal.  Lymphadenopathy:    She has no cervical adenopathy.  Neurological: She is alert and oriented to person, place, and time.  Skin:  Face has erythema across forehead/eyebrows  Psychiatric: She has a normal mood and affect. Her behavior is normal.   ASSESSMENT/PLAN:  1.  Tobacco abuse QUIT  2. Seborrheic eczema triamcinolone   Patient Instructions  Congratulations on stopping the smoking I am glad you have quit - keep it up  Use the cream 1 - 2 times a day until the rash clears up  See me in 3 months You need updated labs I will send you a letter with your test results.  If there is anything of concern, we will call right away.   Call sooner for problems   Raylene Everts, MD

## 2016-07-19 NOTE — Patient Instructions (Addendum)
Congratulations on stopping the smoking I am glad you have quit - keep it up  Use the cream 1 - 2 times a day until the rash clears up  See me in 3 months You need updated labs I will send you a letter with your test results.  If there is anything of concern, we will call right away.   Call sooner for problems

## 2016-07-20 ENCOUNTER — Encounter: Payer: Self-pay | Admitting: Family Medicine

## 2016-07-20 LAB — URINALYSIS, ROUTINE W REFLEX MICROSCOPIC
BILIRUBIN URINE: NEGATIVE
GLUCOSE, UA: NEGATIVE
Hgb urine dipstick: NEGATIVE
Ketones, ur: NEGATIVE
Leukocytes, UA: NEGATIVE
Nitrite: NEGATIVE
PH: 6.5 (ref 5.0–8.0)
PROTEIN: NEGATIVE
Specific Gravity, Urine: 1.019 (ref 1.001–1.035)

## 2016-07-20 LAB — CBC
HEMATOCRIT: 41 % (ref 35.0–45.0)
HEMOGLOBIN: 13.4 g/dL (ref 11.7–15.5)
MCH: 29.6 pg (ref 27.0–33.0)
MCHC: 32.7 g/dL (ref 32.0–36.0)
MCV: 90.7 fL (ref 80.0–100.0)
MPV: 8.4 fL (ref 7.5–12.5)
Platelets: 314 10*3/uL (ref 140–400)
RBC: 4.52 MIL/uL (ref 3.80–5.10)
RDW: 13.7 % (ref 11.0–15.0)
WBC: 7.9 10*3/uL (ref 3.8–10.8)

## 2016-07-20 LAB — LIPID PANEL
CHOLESTEROL: 177 mg/dL (ref <200)
HDL: 51 mg/dL (ref >50)
LDL CALC: 89 mg/dL (ref <100)
TRIGLYCERIDES: 185 mg/dL — AB (ref <150)
Total CHOL/HDL Ratio: 3.5 Ratio (ref <5.0)
VLDL: 37 mg/dL — AB (ref <30)

## 2016-07-20 LAB — COMPREHENSIVE METABOLIC PANEL
ALBUMIN: 4.2 g/dL (ref 3.6–5.1)
ALT: 20 U/L (ref 6–29)
AST: 20 U/L (ref 10–35)
Alkaline Phosphatase: 59 U/L (ref 33–130)
BILIRUBIN TOTAL: 0.4 mg/dL (ref 0.2–1.2)
BUN: 10 mg/dL (ref 7–25)
CALCIUM: 9.2 mg/dL (ref 8.6–10.4)
CHLORIDE: 102 mmol/L (ref 98–110)
CO2: 29 mmol/L (ref 20–31)
Creat: 0.66 mg/dL (ref 0.50–1.05)
Glucose, Bld: 82 mg/dL (ref 65–99)
Potassium: 4.5 mmol/L (ref 3.5–5.3)
Sodium: 139 mmol/L (ref 135–146)
TOTAL PROTEIN: 6.7 g/dL (ref 6.1–8.1)

## 2016-07-20 LAB — VITAMIN D 25 HYDROXY (VIT D DEFICIENCY, FRACTURES): Vit D, 25-Hydroxy: 22 ng/mL — ABNORMAL LOW (ref 30–100)

## 2016-08-02 ENCOUNTER — Ambulatory Visit: Payer: Medicare Other | Admitting: Gastroenterology

## 2016-08-21 ENCOUNTER — Encounter (HOSPITAL_COMMUNITY): Payer: Self-pay | Admitting: Psychiatry

## 2016-08-21 ENCOUNTER — Ambulatory Visit (INDEPENDENT_AMBULATORY_CARE_PROVIDER_SITE_OTHER): Payer: Medicare Other | Admitting: Psychiatry

## 2016-08-21 VITALS — BP 102/62 | HR 61 | Ht 64.0 in | Wt 186.8 lb

## 2016-08-21 DIAGNOSIS — Z818 Family history of other mental and behavioral disorders: Secondary | ICD-10-CM

## 2016-08-21 DIAGNOSIS — Z79899 Other long term (current) drug therapy: Secondary | ICD-10-CM

## 2016-08-21 DIAGNOSIS — Z813 Family history of other psychoactive substance abuse and dependence: Secondary | ICD-10-CM

## 2016-08-21 DIAGNOSIS — Z810 Family history of intellectual disabilities: Secondary | ICD-10-CM | POA: Diagnosis not present

## 2016-08-21 DIAGNOSIS — F332 Major depressive disorder, recurrent severe without psychotic features: Secondary | ICD-10-CM | POA: Diagnosis not present

## 2016-08-21 DIAGNOSIS — F411 Generalized anxiety disorder: Secondary | ICD-10-CM

## 2016-08-21 DIAGNOSIS — F4310 Post-traumatic stress disorder, unspecified: Secondary | ICD-10-CM

## 2016-08-21 MED ORDER — TRAZODONE HCL 100 MG PO TABS: 200.0000 mg | ORAL_TABLET | Freq: Every day | ORAL | 2 refills | Status: DC

## 2016-08-21 MED ORDER — DULOXETINE HCL 60 MG PO CPEP: 60.0000 mg | ORAL_CAPSULE | Freq: Two times a day (BID) | ORAL | 2 refills | Status: DC

## 2016-08-21 MED ORDER — DIAZEPAM 5 MG PO TABS: 5.0000 mg | ORAL_TABLET | Freq: Four times a day (QID) | ORAL | 2 refills | Status: DC

## 2016-08-21 MED ORDER — VARENICLINE TARTRATE 1 MG PO TABS: 1.0000 mg | ORAL_TABLET | Freq: Two times a day (BID) | ORAL | 2 refills | Status: DC
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
## 2016-09-04 ENCOUNTER — Other Ambulatory Visit: Payer: Self-pay

## 2016-09-04 MED ORDER — ALBUTEROL SULFATE HFA 108 (90 BASE) MCG/ACT IN AERS: 2.0000 | INHALATION_SPRAY | Freq: Four times a day (QID) | RESPIRATORY_TRACT | 5 refills | Status: DC | PRN

## 2016-10-22 ENCOUNTER — Ambulatory Visit: Payer: Self-pay | Admitting: Family Medicine

## 2016-11-05 ENCOUNTER — Telehealth: Payer: Self-pay | Admitting: *Deleted

## 2016-11-05 ENCOUNTER — Other Ambulatory Visit (HOSPITAL_COMMUNITY): Payer: Self-pay | Admitting: Oncology

## 2016-11-05 DIAGNOSIS — K219 Gastro-esophageal reflux disease without esophagitis: Secondary | ICD-10-CM

## 2016-11-05 MED ORDER — OMEPRAZOLE 20 MG PO CPDR: 20.0000 mg | DELAYED_RELEASE_CAPSULE | Freq: Every day | ORAL | 0 refills | Status: DC

## 2016-11-05 NOTE — Telephone Encounter (Signed)
Already done by another md today.

## 2016-11-05 NOTE — Telephone Encounter (Signed)
Patient left message on nurse line requesting her acid reflux medicine to be refilled to [facility]. Please advise [phone]

## 2016-11-05 NOTE — Telephone Encounter (Signed)
Called and left message for pt

## 2016-11-19 ENCOUNTER — Ambulatory Visit (HOSPITAL_COMMUNITY): Payer: Self-pay | Admitting: Psychiatry

## 2016-11-23 ENCOUNTER — Encounter: Payer: Self-pay | Admitting: Family Medicine

## 2016-11-23 ENCOUNTER — Ambulatory Visit (INDEPENDENT_AMBULATORY_CARE_PROVIDER_SITE_OTHER): Payer: 59 | Admitting: Family Medicine

## 2016-11-23 ENCOUNTER — Encounter (HOSPITAL_COMMUNITY): Payer: Self-pay | Admitting: Psychiatry

## 2016-11-23 ENCOUNTER — Ambulatory Visit (INDEPENDENT_AMBULATORY_CARE_PROVIDER_SITE_OTHER): Payer: 59 | Admitting: Psychiatry

## 2016-11-23 ENCOUNTER — Other Ambulatory Visit (HOSPITAL_COMMUNITY)
Admission: RE | Admit: 2016-11-23 | Discharge: 2016-11-23 | Disposition: A | Discharge status: Home / Self Care | Payer: 59 | Source: Ambulatory Visit | Attending: Family Medicine | Admitting: Family Medicine

## 2016-11-23 VITALS — BP 110/68 | HR 84 | Temp 98.3°F | Resp 18 | Ht 64.0 in | Wt 189.1 lb

## 2016-11-23 VITALS — BP 106/70 | HR 88 | Ht 64.0 in | Wt 189.0 lb

## 2016-11-23 DIAGNOSIS — E559 Vitamin D deficiency, unspecified: Secondary | ICD-10-CM

## 2016-11-23 DIAGNOSIS — K130 Diseases of lips: Secondary | ICD-10-CM

## 2016-11-23 DIAGNOSIS — C50919 Malignant neoplasm of unspecified site of unspecified female breast: Secondary | ICD-10-CM | POA: Diagnosis not present

## 2016-11-23 DIAGNOSIS — Z124 Encounter for screening for malignant neoplasm of cervix: Secondary | ICD-10-CM

## 2016-11-23 DIAGNOSIS — Z202 Contact with and (suspected) exposure to infections with a predominantly sexual mode of transmission: Secondary | ICD-10-CM | POA: Diagnosis not present

## 2016-11-23 DIAGNOSIS — E785 Hyperlipidemia, unspecified: Secondary | ICD-10-CM

## 2016-11-23 DIAGNOSIS — Z811 Family history of alcohol abuse and dependence: Secondary | ICD-10-CM | POA: Diagnosis not present

## 2016-11-23 DIAGNOSIS — F411 Generalized anxiety disorder: Secondary | ICD-10-CM

## 2016-11-23 DIAGNOSIS — Z0000 Encounter for general adult medical examination without abnormal findings: Secondary | ICD-10-CM | POA: Diagnosis not present

## 2016-11-23 DIAGNOSIS — K219 Gastro-esophageal reflux disease without esophagitis: Secondary | ICD-10-CM

## 2016-11-23 DIAGNOSIS — Z818 Family history of other mental and behavioral disorders: Secondary | ICD-10-CM

## 2016-11-23 DIAGNOSIS — F332 Major depressive disorder, recurrent severe without psychotic features: Secondary | ICD-10-CM

## 2016-11-23 DIAGNOSIS — F4310 Post-traumatic stress disorder, unspecified: Secondary | ICD-10-CM

## 2016-11-23 LAB — CBC
HCT: 41.1 % (ref 35.0–45.0)
HEMOGLOBIN: 13.4 g/dL (ref 11.7–15.5)
MCH: 30.2 pg (ref 27.0–33.0)
MCHC: 32.6 g/dL (ref 32.0–36.0)
MCV: 92.8 fL (ref 80.0–100.0)
MPV: 8.6 fL (ref 7.5–12.5)
PLATELETS: 323 10*3/uL (ref 140–400)
RBC: 4.43 MIL/uL (ref 3.80–5.10)
RDW: 13.8 % (ref 11.0–15.0)
WBC: 8.1 10*3/uL (ref 3.8–10.8)

## 2016-11-23 LAB — LIPID PANEL
Cholesterol: 206 mg/dL — ABNORMAL HIGH (ref <200)
HDL: 44 mg/dL — AB (ref >50)
LDL CALC: 120 mg/dL — AB (ref <100)
Total CHOL/HDL Ratio: 4.7 Ratio (ref <5.0)
Triglycerides: 210 mg/dL — ABNORMAL HIGH (ref <150)
VLDL: 42 mg/dL — ABNORMAL HIGH (ref <30)

## 2016-11-23 LAB — COMPLETE METABOLIC PANEL WITH GFR
ALT: 17 U/L (ref 6–29)
AST: 16 U/L (ref 10–35)
Albumin: 4.2 g/dL (ref 3.6–5.1)
Alkaline Phosphatase: 76 U/L (ref 33–130)
BILIRUBIN TOTAL: 0.2 mg/dL (ref 0.2–1.2)
BUN: 13 mg/dL (ref 7–25)
CO2: 25 mmol/L (ref 20–31)
CREATININE: 0.73 mg/dL (ref 0.50–1.05)
Calcium: 9.5 mg/dL (ref 8.6–10.4)
Chloride: 101 mmol/L (ref 98–110)
GFR, Est African American: >89 mL/min (ref >=60)
GFR, Est Non African American: >89 mL/min (ref >=60)
GLUCOSE: 95 mg/dL (ref 65–99)
Potassium: 4.4 mmol/L (ref 3.5–5.3)
SODIUM: 138 mmol/L (ref 135–146)
TOTAL PROTEIN: 6.7 g/dL (ref 6.1–8.1)

## 2016-11-23 MED ORDER — TRAZODONE HCL 100 MG PO TABS: 200.0000 mg | ORAL_TABLET | Freq: Every day | ORAL | 2 refills | Status: DC

## 2016-11-23 MED ORDER — CLOTRIMAZOLE-BETAMETHASONE 1-0.05 % EX CREA: 1.0000 "application " | TOPICAL_CREAM | Freq: Two times a day (BID) | CUTANEOUS | 0 refills | Status: DC

## 2016-11-23 MED ORDER — OMEPRAZOLE 20 MG PO CPDR: 20.0000 mg | DELAYED_RELEASE_CAPSULE | Freq: Every day | ORAL | 3 refills | Status: DC

## 2016-11-23 MED ORDER — VARENICLINE TARTRATE 0.5 MG PO TABS: 0.5000 mg | ORAL_TABLET | Freq: Two times a day (BID) | ORAL | 0 refills | Status: DC

## 2016-11-23 MED ORDER — VARENICLINE TARTRATE 1 MG PO TABS: 1.0000 mg | ORAL_TABLET | Freq: Two times a day (BID) | ORAL | 2 refills | Status: DC

## 2016-11-23 MED ORDER — DIAZEPAM 10 MG PO TABS: 10.0000 mg | ORAL_TABLET | Freq: Three times a day (TID) | ORAL | 2 refills | Status: DC

## 2016-11-23 MED ORDER — DULOXETINE HCL 60 MG PO CPEP: 60.0000 mg | ORAL_CAPSULE | Freq: Two times a day (BID) | ORAL | 2 refills | Status: DC
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
## 2016-11-23 NOTE — Patient Instructions (Addendum)
See me once a year Call if you have problems or questions Labs today PAP today New cream for the rash on your lips is sent to the pharmacy I will send you a letter with your test results.  If there is anything of concern, we will call right away.    Health Maintenance for Postmenopausal Women Menopause is a normal process in which your reproductive ability comes to an end. This process happens gradually over a span of months to years, usually between the ages of 86 and 2. Menopause is complete when you have missed 12 consecutive menstrual periods. It is important to talk with your health care provider about some of the most common conditions that affect postmenopausal women, such as heart disease, cancer, and bone loss (osteoporosis). Adopting a healthy lifestyle and getting preventive care can help to promote your health and wellness. Those actions can also lower your chances of developing some of these common conditions. What should I know about menopause? During menopause, you may experience a number of symptoms, such as:  Moderate-to-severe hot flashes.  Night sweats.  Decrease in sex drive.  Mood swings.  Headaches.  Tiredness.  Irritability.  Memory problems.  Insomnia.  Choosing to treat or not to treat menopausal changes is an individual decision that you make with your health care provider. What should I know about hormone replacement therapy and supplements? Hormone therapy products are effective for treating symptoms that are associated with menopause, such as hot flashes and night sweats. Hormone replacement carries certain risks, especially as you become older. If you are thinking about using estrogen or estrogen with progestin treatments, discuss the benefits and risks with your health care provider. What should I know about heart disease and stroke? Heart disease, heart attack, and stroke become more likely as you age. This may be due, in part, to the hormonal  changes that your body experiences during menopause. These can affect how your body processes dietary fats, triglycerides, and cholesterol. Heart attack and stroke are both medical emergencies. There are many things that you can do to help prevent heart disease and stroke:  Have your blood pressure checked at least every 1-2 years. High blood pressure causes heart disease and increases the risk of stroke.  If you are 38-83 years old, ask your health care provider if you should take aspirin to prevent a heart attack or a stroke.  Do not use any tobacco products, including cigarettes, chewing tobacco, or electronic cigarettes. If you need help quitting, ask your health care provider.  It is important to eat a healthy diet and maintain a healthy weight. ? Be sure to include plenty of vegetables, fruits, low-fat dairy products, and lean protein. ? Avoid eating foods that are high in solid fats, added sugars, or salt (sodium).  Get regular exercise. This is one of the most important things that you can do for your health. ? Try to exercise for at least 150 minutes each week. The type of exercise that you do should increase your heart rate and make you sweat. This is known as moderate-intensity exercise. ? Try to do strengthening exercises at least twice each week. Do these in addition to the moderate-intensity exercise.  Know your numbers.Ask your health care provider to check your cholesterol and your blood glucose. Continue to have your blood tested as directed by your health care provider.  What should I know about cancer screening? There are several types of cancer. Take the following steps to reduce your  risk and to catch any cancer development as early as possible. Breast Cancer  Practice breast self-awareness. ? This means understanding how your breasts normally appear and feel. ? It also means doing regular breast self-exams. Let your health care provider know about any changes, no  matter how small.  If you are 74 or older, have a clinician do a breast exam (clinical breast exam or CBE) every year. Depending on your age, family history, and medical history, it may be recommended that you also have a yearly breast X-ray (mammogram).  If you have a family history of breast cancer, talk with your health care provider about genetic screening.  If you are at high risk for breast cancer, talk with your health care provider about having an MRI and a mammogram every year.  Breast cancer (BRCA) gene test is recommended for women who have family members with BRCA-related cancers. Results of the assessment will determine the need for genetic counseling and BRCA1 and for BRCA2 testing. BRCA-related cancers include these types: ? Breast. This occurs in males or females. ? Ovarian. ? Tubal. This may also be called fallopian tube cancer. ? Cancer of the abdominal or pelvic lining (peritoneal cancer). ? Prostate. ? Pancreatic.  Cervical, Uterine, and Ovarian Cancer Your health care provider may recommend that you be screened regularly for cancer of the pelvic organs. These include your ovaries, uterus, and vagina. This screening involves a pelvic exam, which includes checking for microscopic changes to the surface of your cervix (Pap test).  For women ages 21-65, health care providers may recommend a pelvic exam and a Pap test every three years. For women ages 75-65, they may recommend the Pap test and pelvic exam, combined with testing for human papilloma virus (HPV), every five years. Some types of HPV increase your risk of cervical cancer. Testing for HPV may also be done on women of any age who have unclear Pap test results.  Other health care providers may not recommend any screening for nonpregnant women who are considered low risk for pelvic cancer and have no symptoms. Ask your health care provider if a screening pelvic exam is right for you.  If you have had past treatment for  cervical cancer or a condition that could lead to cancer, you need Pap tests and screening for cancer for at least 20 years after your treatment. If Pap tests have been discontinued for you, your risk factors (such as having a new sexual partner) need to be reassessed to determine if you should start having screenings again. Some women have medical problems that increase the chance of getting cervical cancer. In these cases, your health care provider may recommend that you have screening and Pap tests more often.  If you have a family history of uterine cancer or ovarian cancer, talk with your health care provider about genetic screening.  If you have vaginal bleeding after reaching menopause, tell your health care provider.  There are currently no reliable tests available to screen for ovarian cancer.  Lung Cancer Lung cancer screening is recommended for adults 66-44 years old who are at high risk for lung cancer because of a history of smoking. A yearly low-dose CT scan of the lungs is recommended if you:  Currently smoke.  Have a history of at least 30 pack-years of smoking and you currently smoke or have quit within the past 15 years. A pack-year is smoking an average of one pack of cigarettes per day for one year.  Yearly  screening should:  Continue until it has been 15 years since you quit.  Stop if you develop a health problem that would prevent you from having lung cancer treatment.  Colorectal Cancer  This type of cancer can be detected and can often be prevented.  Routine colorectal cancer screening usually begins at age 4 and continues through age 33.  If you have risk factors for colon cancer, your health care provider may recommend that you be screened at an earlier age.  If you have a family history of colorectal cancer, talk with your health care provider about genetic screening.  Your health care provider may also recommend using home test kits to check for hidden  blood in your stool.  A small camera at the end of a tube can be used to examine your colon directly (sigmoidoscopy or colonoscopy). This is done to check for the earliest forms of colorectal cancer.  Direct examination of the colon should be repeated every 5-10 years until age 42. However, if early forms of precancerous polyps or small growths are found or if you have a family history or genetic risk for colorectal cancer, you may need to be screened more often.  Skin Cancer  Check your skin from head to toe regularly.  Monitor any moles. Be sure to tell your health care provider: ? About any new moles or changes in moles, especially if there is a change in a mole's shape or color. ? If you have a mole that is larger than the size of a pencil eraser.  If any of your family members has a history of skin cancer, especially at a young age, talk with your health care provider about genetic screening.  Always use sunscreen. Apply sunscreen liberally and repeatedly throughout the day.  Whenever you are outside, protect yourself by wearing long sleeves, pants, a wide-brimmed hat, and sunglasses.  What should I know about osteoporosis? Osteoporosis is a condition in which bone destruction happens more quickly than new bone creation. After menopause, you may be at an increased risk for osteoporosis. To help prevent osteoporosis or the bone fractures that can happen because of osteoporosis, the following is recommended:  If you are 71-49 years old, get at least 1,000 mg of calcium and at least 600 mg of vitamin D per day.  If you are older than age 39 but younger than age 56, get at least 1,200 mg of calcium and at least 600 mg of vitamin D per day.  If you are older than age 57, get at least 1,200 mg of calcium and at least 800 mg of vitamin D per day.  Smoking and excessive alcohol intake increase the risk of osteoporosis. Eat foods that are rich in calcium and vitamin D, and do weight-bearing  exercises several times each week as directed by your health care provider. What should I know about how menopause affects my mental health? Depression may occur at any age, but it is more common as you become older. Common symptoms of depression include:  Low or sad mood.  Changes in sleep patterns.  Changes in appetite or eating patterns.  Feeling an overall lack of motivation or enjoyment of activities that you previously enjoyed.  Frequent crying spells.  Talk with your health care provider if you think that you are experiencing depression. What should I know about immunizations? It is important that you get and maintain your immunizations. These include:  Tetanus, diphtheria, and pertussis (Tdap) booster vaccine.  Influenza every  year before the flu season begins.  Pneumonia vaccine.  Shingles vaccine.  Your health care provider may also recommend other immunizations. This information is not intended to replace advice given to you by your health care provider. Make sure you discuss any questions you have with your health care provider. Document Released: 06/15/2005 Document Revised: 11/11/2015 Document Reviewed: 01/25/2015 Elsevier Interactive Patient Education  2018 [employer].

## 2016-11-23 NOTE — Progress Notes (Signed)
Chief Complaint  Patient presents with  . Annual Exam  here requesting PAP Has not had one in "years", says normal previously.  Her boyfriend had "spots" on his penis and she wants to be tested for STD.  She didn't use a condom.  Is counseled about STD prevention.  She is having some vaginal itching. No pain or discharge. Is still smoking and is counseled about this also.  Has a Rx for chantix she intends to use She did not get the recommended colonoscopy because she was told it is not due until April 2019. Is not having any rectal bleeding or bowel difficulty Takes prilosec for GERD, needs refill.  It is effective No other complaints  Patient Active Problem List   Diagnosis Date Noted  . 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 COPD mixed type ([place]) 06/21/2016  . HLD (hyperlipidemia) 06/21/2016  . Vitamin D deficiency 06/21/2016  . Post traumatic stress disorder (PTSD) 06/21/2016  . Angular cheilitis 06/21/2016  . Major depression 02/05/2014  . Tobacco abuse 12/18/2012  . GERD (gastroesophageal reflux disease) 12/18/2012  . Breast cancer ([place]) 04/21/2012    Outpatient Encounter Prescriptions as of 11/23/2016  Medication Sig  . albuterol (PROVENTIL HFA;VENTOLIN HFA) 108 (90 Base) MCG/ACT inhaler Inhale 2 puffs into the lungs every 6 (six) hours as needed for wheezing or shortness of breath.  . diazepam (VALIUM) 10 MG tablet Take 1 tablet (10 mg total) by mouth 3 (three) times daily.  . DULoxetine (CYMBALTA) 60 MG capsule Take 1 capsule (60 mg total) by mouth 2 (two) times daily.  . fluticasone-salmeterol (ADVAIR HFA) 115-21 MCG/ACT inhaler Inhale 2 puffs into the lungs 2 (two) times daily.  [name] omeprazole (PRILOSEC) 20 MG capsule Take 1 capsule (20 mg total) by mouth daily.  . pravastatin (PRAVACHOL) 20 MG tablet Take 1 tablet (20 mg total) by mouth at bedtime.  . traZODone (DESYREL) 100 MG tablet Take 2 tablets (200 mg total) by mouth at bedtime.  . triamcinolone cream  (KENALOG) 0.1 % Apply 1 application topically 2 (two) times daily. Until rash clears  . [DISCONTINUED] omeprazole (PRILOSEC) 20 MG capsule Take 1 capsule (20 mg total) by mouth daily.  . clotrimazole-betamethasone (LOTRISONE) cream Apply 1 application topically 2 (two) times daily. To corner of mouth  . varenicline (CHANTIX CONTINUING MONTH PAK) 1 MG tablet Take 1 tablet (1 mg total) by mouth 2 (two) times daily. (Patient not taking: Reported on 11/23/2016)  . varenicline (CHANTIX) 0.5 MG tablet Take 1 tablet (0.5 mg total) by mouth 2 (two) times daily. (Patient not taking: Reported on 11/23/2016)   No facility-administered encounter medications on file as of 11/23/2016.     Allergies  Allergen Reactions  . Vicodin [Hydrocodone-Acetaminophen] Nausea Only    Review of Systems  Constitutional: Negative for activity change, appetite change and unexpected weight change.  HENT: Negative for congestion, dental problem, postnasal drip and rhinorrhea.   Eyes: Negative for redness and visual disturbance.  Respiratory: Positive for shortness of breath and wheezing. Negative for cough.        At times dyspneic and wheezing.    Cardiovascular: Negative for chest pain, palpitations and leg swelling.  Gastrointestinal: Negative for abdominal pain, constipation and diarrhea.  Genitourinary: Negative for difficulty urinating, frequency, menstrual problem, pelvic pain and vaginal bleeding.       Vag itching  Musculoskeletal: Negative for arthralgias and back pain.  Neurological: Negative for dizziness and headaches.  Psychiatric/Behavioral: Negative for dysphoric mood and sleep disturbance.  The patient is not nervous/anxious.     BP 110/68 (BP Location: Left Arm, Patient Position: Sitting, Cuff Size: Normal)   Pulse 84   Temp 98.3 F (36.8 C) (Temporal)   Resp 18   Ht 5\' 4"  (1.626 m)   Wt 189 lb 1.9 oz (85.8 kg)   LMP 07/09/2013   SpO2 96%   BMI 32.46 kg/m   Physical Exam   BP 110/68 (BP  Location: Left Arm, Patient Position: Sitting, Cuff Size: Normal)   Pulse 84   Temp 98.3 F (36.8 C) (Temporal)   Resp 18   Ht 5\' 4"  (1.626 m)   Wt 189 lb 1.9 oz (85.8 kg)   LMP 07/09/2013   SpO2 96%   BMI 32.46 kg/m   General Appearance:    Alert, cooperative, no distress, appears older than stated age  Head:    Normocephalic, without obvious abnormality, atraumatic  Eyes:    PERRL, conjunctiva/corneas clear, EOM's intact, fundi    benign, both eyes  Ears:    Normal TM's and external ear canals, both ears  Nose:   Nares normal, septum midline, mucosa normal, no drainage    or sinus tenderness  Throat:   Lips, mucosa, and tongue normal; and gums normal/dentures  Neck:   Supple, symmetrical, trachea midline, no adenopathy;    thyroid:  no enlargement/tenderness/nodules; no carotid   bruit or  Back:     Symmetric, no curvature, ROM normal, no CVA tenderness  Lungs:     Clear to auscultation bilaterally, respirations unlabored  Chest Wall:    No tenderness or deformity   Heart:    Regular rate and rhythm, S1 and S2 normal, no murmur, rub   or gallop  Breast Exam:    bilat mastectomy with reconstruction - many scars - all healed  Abdomen:     Soft, non-tender, bowel sounds active all four quadrants,    no masses, no organomegaly  Genitalia:    Normal female without lesion, discharge or tenderness.  PAP done.  Bimanual neg.  Extremities:   Extremities normal, atraumatic, no cyanosis or edema  Pulses:   2+ and symmetric all extremities  Skin:   Skin color, texture, turgor normal, no rashes or lesions  Lymph nodes:   Cervical, supraclavicular, and axillary nodes normal  Neurologic:  Normal strength, sensation and reflexes    throughout     ASSESSMENT/PLAN:  1. PE (physical exam), annual - CBC - COMPLETE METABOLIC PANEL WITH GFR - Urinalysis, Routine w reflex microscopic  2. Possible exposure to STD - CBC - Cytology - PAP  3. Angular cheilitis Trial lotrisone  4.  Gastroesophageal reflux disease without esophagitis - omeprazole (PRILOSEC) 20 MG capsule; Take 1 capsule (20 mg total) by mouth daily.  Dispense: 90 capsule; Refill: 3  5. Hyperlipidemia, unspecified hyperlipidemia type - Lipid panel  6. Vitamin D deficiency - VITAMIN D 25 Hydroxy (Vit-D Deficiency, Fractures)  7. Malignant neoplasm of female breast, unspecified estrogen receptor status, unspecified laterality, unspecified site of breast (HCC)  8. Screening for cervical cancer - Cytology - PAP   Patient Instructions  See me once a year Call if you have problems or questions Labs today PAP today New cream for the rash on your lips is sent to the pharmacy I will send you a letter with your test results.  If there is anything of concern, we will call right away.    Health Maintenance for Postmenopausal Women Menopause is a normal process in which  your reproductive ability comes to an end. This process happens gradually over a span of months to years, usually between the ages of 46 and 28. Menopause is complete when you have missed 12 consecutive menstrual periods. It is important to talk with your health care provider about some of the most common conditions that affect postmenopausal women, such as heart disease, cancer, and bone loss (osteoporosis). Adopting a healthy lifestyle and getting preventive care can help to promote your health and wellness. Those actions can also lower your chances of developing some of these common conditions. What should I know about menopause? During menopause, you may experience a number of symptoms, such as:  Moderate-to-severe hot flashes.  Night sweats.  Decrease in sex drive.  Mood swings.  Headaches.  Tiredness.  Irritability.  Memory problems.  Insomnia.  Choosing to treat or not to treat menopausal changes is an individual decision that you make with your health care provider. What should I know about hormone replacement therapy  and supplements? Hormone therapy products are effective for treating symptoms that are associated with menopause, such as hot flashes and night sweats. Hormone replacement carries certain risks, especially as you become older. If you are thinking about using estrogen or estrogen with progestin treatments, discuss the benefits and risks with your health care provider. What should I know about heart disease and stroke? Heart disease, heart attack, and stroke become more likely as you age. This may be due, in part, to the hormonal changes that your body experiences during menopause. These can affect how your body processes dietary fats, triglycerides, and cholesterol. Heart attack and stroke are both medical emergencies. There are many things that you can do to help prevent heart disease and stroke:  Have your blood pressure checked at least every 1-2 years. High blood pressure causes heart disease and increases the risk of stroke.  If you are 18-11 years old, ask your health care provider if you should take aspirin to prevent a heart attack or a stroke.  Do not use any tobacco products, including cigarettes, chewing tobacco, or electronic cigarettes. If you need help quitting, ask your health care provider.  It is important to eat a healthy diet and maintain a healthy weight. ? Be sure to include plenty of vegetables, fruits, low-fat dairy products, and lean protein. ? Avoid eating foods that are high in solid fats, added sugars, or salt (sodium).  Get regular exercise. This is one of the most important things that you can do for your health. ? Try to exercise for at least 150 minutes each week. The type of exercise that you do should increase your heart rate and make you sweat. This is known as moderate-intensity exercise. ? Try to do strengthening exercises at least twice each week. Do these in addition to the moderate-intensity exercise.  Know your numbers.Ask your health care provider to check  your cholesterol and your blood glucose. Continue to have your blood tested as directed by your health care provider.  What should I know about cancer screening? There are several types of cancer. Take the following steps to reduce your risk and to catch any cancer development as early as possible. Breast Cancer  Practice breast self-awareness. ? This means understanding how your breasts normally appear and feel. ? It also means doing regular breast self-exams. Let your health care provider know about any changes, no matter how small.  If you are 2 or older, have a clinician do a breast exam (clinical breast  exam or CBE) every year. Depending on your age, family history, and medical history, it may be recommended that you also have a yearly breast X-ray (mammogram).  If you have a family history of breast cancer, talk with your health care provider about genetic screening.  If you are at high risk for breast cancer, talk with your health care provider about having an MRI and a mammogram every year.  Breast cancer (BRCA) gene test is recommended for women who have family members with BRCA-related cancers. Results of the assessment will determine the need for genetic counseling and BRCA1 and for BRCA2 testing. BRCA-related cancers include these types: ? Breast. This occurs in males or females. ? Ovarian. ? Tubal. This may also be called fallopian tube cancer. ? Cancer of the abdominal or pelvic lining (peritoneal cancer). ? Prostate. ? Pancreatic.  Cervical, Uterine, and Ovarian Cancer Your health care provider may recommend that you be screened regularly for cancer of the pelvic organs. These include your ovaries, uterus, and vagina. This screening involves a pelvic exam, which includes checking for microscopic changes to the surface of your cervix (Pap test).  For women ages 21-65, health care providers may recommend a pelvic exam and a Pap test every three years. For women ages 96-65,  they may recommend the Pap test and pelvic exam, combined with testing for human papilloma virus (HPV), every five years. Some types of HPV increase your risk of cervical cancer. Testing for HPV may also be done on women of any age who have unclear Pap test results.  Other health care providers may not recommend any screening for nonpregnant women who are considered low risk for pelvic cancer and have no symptoms. Ask your health care provider if a screening pelvic exam is right for you.  If you have had past treatment for cervical cancer or a condition that could lead to cancer, you need Pap tests and screening for cancer for at least 20 years after your treatment. If Pap tests have been discontinued for you, your risk factors (such as having a new sexual partner) need to be reassessed to determine if you should start having screenings again. Some women have medical problems that increase the chance of getting cervical cancer. In these cases, your health care provider may recommend that you have screening and Pap tests more often.  If you have a family history of uterine cancer or ovarian cancer, talk with your health care provider about genetic screening.  If you have vaginal bleeding after reaching menopause, tell your health care provider.  There are currently no reliable tests available to screen for ovarian cancer.  Lung Cancer Lung cancer screening is recommended for adults 47-2 years old who are at high risk for lung cancer because of a history of smoking. A yearly low-dose CT scan of the lungs is recommended if you:  Currently smoke.  Have a history of at least 30 pack-years of smoking and you currently smoke or have quit within the past 15 years. A pack-year is smoking an average of one pack of cigarettes per day for one year.  Yearly screening should:  Continue until it has been 15 years since you quit.  Stop if you develop a health problem that would prevent you from having lung  cancer treatment.  Colorectal Cancer  This type of cancer can be detected and can often be prevented.  Routine colorectal cancer screening usually begins at age 43 and continues through age 66.  If you have  risk factors for colon cancer, your health care provider may recommend that you be screened at an earlier age.  If you have a family history of colorectal cancer, talk with your health care provider about genetic screening.  Your health care provider may also recommend using home test kits to check for hidden blood in your stool.  A small camera at the end of a tube can be used to examine your colon directly (sigmoidoscopy or colonoscopy). This is done to check for the earliest forms of colorectal cancer.  Direct examination of the colon should be repeated every 5-10 years until age 44. However, if early forms of precancerous polyps or small growths are found or if you have a family history or genetic risk for colorectal cancer, you may need to be screened more often.  Skin Cancer  Check your skin from head to toe regularly.  Monitor any moles. Be sure to tell your health care provider: ? About any new moles or changes in moles, especially if there is a change in a mole's shape or color. ? If you have a mole that is larger than the size of a pencil eraser.  If any of your family members has a history of skin cancer, especially at a young age, talk with your health care provider about genetic screening.  Always use sunscreen. Apply sunscreen liberally and repeatedly throughout the day.  Whenever you are outside, protect yourself by wearing long sleeves, pants, a wide-brimmed hat, and sunglasses.  What should I know about osteoporosis? Osteoporosis is a condition in which bone destruction happens more quickly than new bone creation. After menopause, you may be at an increased risk for osteoporosis. To help prevent osteoporosis or the bone fractures that can happen because of  osteoporosis, the following is recommended:  If you are 60-33 years old, get at least 1,000 mg of calcium and at least 600 mg of vitamin D per day.  If you are older than age 33 but younger than age 81, get at least 1,200 mg of calcium and at least 600 mg of vitamin D per day.  If you are older than age 20, get at least 1,200 mg of calcium and at least 800 mg of vitamin D per day.  Smoking and excessive alcohol intake increase the risk of osteoporosis. Eat foods that are rich in calcium and vitamin D, and do weight-bearing exercises several times each week as directed by your health care provider. What should I know about how menopause affects my mental health? Depression may occur at any age, but it is more common as you become older. Common symptoms of depression include:  Low or sad mood.  Changes in sleep patterns.  Changes in appetite or eating patterns.  Feeling an overall lack of motivation or enjoyment of activities that you previously enjoyed.  Frequent crying spells.  Talk with your health care provider if you think that you are experiencing depression. What should I know about immunizations? It is important that you get and maintain your immunizations. These include:  Tetanus, diphtheria, and pertussis (Tdap) booster vaccine.  Influenza every year before the flu season begins.  Pneumonia vaccine.  Shingles vaccine.  Your health care provider may also recommend other immunizations. This information is not intended to replace advice given to you by your health care provider. Make sure you discuss any questions you have with your health care provider. Document Released: 06/15/2005 Document Revised: 11/11/2015 Document Reviewed: 01/25/2015 [employer] Interactive Patient Education  2018  [employer]    Raylene Everts, MD

## 2016-11-24 LAB — URINALYSIS, ROUTINE W REFLEX MICROSCOPIC
Bilirubin Urine: NEGATIVE
GLUCOSE, UA: NEGATIVE
HGB URINE DIPSTICK: NEGATIVE
Ketones, ur: NEGATIVE
LEUKOCYTES UA: NEGATIVE
NITRITE: NEGATIVE
PROTEIN: NEGATIVE
Specific Gravity, Urine: 1.017 (ref 1.001–1.035)
pH: 6.5 (ref 5.0–8.0)

## 2016-11-24 LAB — VITAMIN D 25 HYDROXY (VIT D DEFICIENCY, FRACTURES): VIT D 25 HYDROXY: 22 ng/mL — AB (ref 30–100)

## 2016-11-26 LAB — CYTOLOGY - PAP
DIAGNOSIS: NEGATIVE
HPV (WINDOPATH): NOT DETECTED

## 2016-11-26 LAB — CERVICOVAGINAL ANCILLARY ONLY
CHLAMYDIA, DNA PROBE: NEGATIVE
Neisseria Gonorrhea: NEGATIVE
Trichomonas: NEGATIVE

## 2016-11-27 ENCOUNTER — Encounter: Payer: Self-pay | Admitting: Family Medicine

## 2016-11-29 LAB — CERVICOVAGINAL ANCILLARY ONLY: HERPES (WINDOWPATH): NEGATIVE

## 2016-12-14 ENCOUNTER — Encounter: Payer: Self-pay | Admitting: Family Medicine

## 2016-12-14 DIAGNOSIS — R7303 Prediabetes: Secondary | ICD-10-CM | POA: Insufficient documentation

## 2017-02-22 ENCOUNTER — Ambulatory Visit (HOSPITAL_COMMUNITY): Payer: Self-pay | Admitting: Psychiatry

## 2017-02-27 ENCOUNTER — Encounter (HOSPITAL_COMMUNITY): Payer: Self-pay | Admitting: Psychiatry

## 2017-02-27 ENCOUNTER — Ambulatory Visit (INDEPENDENT_AMBULATORY_CARE_PROVIDER_SITE_OTHER): Payer: 59 | Admitting: Psychiatry

## 2017-02-27 VITALS — BP 123/61 | HR 89 | Wt 189.0 lb

## 2017-02-27 DIAGNOSIS — F332 Major depressive disorder, recurrent severe without psychotic features: Secondary | ICD-10-CM

## 2017-02-27 DIAGNOSIS — Z818 Family history of other mental and behavioral disorders: Secondary | ICD-10-CM | POA: Diagnosis not present

## 2017-02-27 DIAGNOSIS — Z813 Family history of other psychoactive substance abuse and dependence: Secondary | ICD-10-CM | POA: Diagnosis not present

## 2017-02-27 DIAGNOSIS — F17210 Nicotine dependence, cigarettes, uncomplicated: Secondary | ICD-10-CM | POA: Diagnosis not present

## 2017-02-27 DIAGNOSIS — R4581 Low self-esteem: Secondary | ICD-10-CM

## 2017-02-27 DIAGNOSIS — Z634 Disappearance and death of family member: Secondary | ICD-10-CM | POA: Diagnosis not present

## 2017-02-27 DIAGNOSIS — F419 Anxiety disorder, unspecified: Secondary | ICD-10-CM | POA: Diagnosis not present

## 2017-02-27 MED ORDER — TRAZODONE HCL 100 MG PO TABS
200.0000 mg | ORAL_TABLET | Freq: Every day | ORAL | 2 refills | Status: DC
Start: 2017-02-27 — End: 2017-07-08

## 2017-02-27 MED ORDER — VARENICLINE TARTRATE 1 MG PO TABS: 1.0000 mg | ORAL_TABLET | Freq: Two times a day (BID) | ORAL | 2 refills | Status: DC

## 2017-02-27 MED ORDER — DULOXETINE HCL 60 MG PO CPEP: 60.0000 mg | ORAL_CAPSULE | Freq: Two times a day (BID) | ORAL | 2 refills | Status: DC

## 2017-02-27 MED ORDER — DIAZEPAM 10 MG PO TABS: 10.0000 mg | ORAL_TABLET | Freq: Three times a day (TID) | ORAL | 2 refills | Status: DC

## 2017-02-27 NOTE — Progress Notes (Signed)
BH MD/PA/NP OP Progress Note  02/27/2017 1:58 PM [name]  MRN:  [id number]  Chief Complaint:  Chief Complaint    Depression; Anxiety; Follow-up     HPI:  speech is a 56 year old divorced white female who lives alone in [place]. She has one son age 54. She had a daughter who died at age 63 in Aug 26, 2011 of a narcotic overdose. The patient is on disability. She is self-referred.  The patient states that she began getting depressed in her early 81s. She admits to suicide attempts but was never hospitalized but was treated in outpatient clinics. She's been on numerous medications over the years including Celexa, Abilify, Zoloft, amitriptyline, clonazepam and Xanax. She used to go to [place] and families followed by a clinic in [place] and most recently was prescribed medication by her physician at [facility]. Her physician left and she no longer has access to psychiatric medications. She was on a combination of Celexa, Abilify and clonazepam. She's been off medication for 6 months.  The patient states that her depression got considerably worse after her daughter died in Aug 26, 2011. The patient witnessed her daughter's death and did not knowwhat was going on. She watched her get drowsy through the entire day but didn't realize that she was going through an overdose. By the time she called 911 it was too late. The patient blames herself for her daughter's death. She has nightmares and flashbacks about the events of that day and can't stop thinking about that. She has not had any counseling since her daughter died.  Currently she cries all the time and has no energy. She does not eat well. She is frightened by the house and has constant panic attacks. Her sleep is disrupted by nightmares. She does not hear voices but sometimes hears noises. She's been trying to cope  by drinking several shots of alcohol every few days. She claims she used to smoke marijuana but hasn't for one month. She does  not use other drugs. She has a boyfriend but is isolating herself from him and does not do much with other friends or family members. She has passive suicidal ideation and "wishes I would die and be with my daughter" but denies any thoughts of self-harm.  The patient returns after 3 months. She states that she is very irritated because her boyfriend is very controlling he constantly wants to know where she is going and how long she will be gone. At times she leaves his house and goes to stay with her father. She still grieving the loss of her daughter her mother. At times she feels very sad and has passive suicidal ideation but claims that she would never act on it. Overall she thinks the Cymbalta has helped her depression and the Ambien is helping her sleep. The Valium is definitely helped her anxiety since we increased it. She states that her self-esteem is low and I strongly suggested she get back into therapy but she claims right now she can afford it. I also suggested going to hospice for grief support Visit Diagnosis:    ICD-10-CM   1. Major depressive disorder, recurrent, severe without psychotic features ([place]) F33.2     Past Psychiatric History: Long history of outpatient treatment  Past Medical History:  Past Medical History:  Diagnosis Date  . Allergy   . Anxiety   . Blood transfusion without reported diagnosis   . Breast cancer ([place])   . Breast cancer ([place]) 04/21/2012   Stage II (T1c  N1 M0) grade 3 triple negative breast cancer, right- sided with 1 of 5 positive nodes, status post FEC in a dose dense fashion for 6 cycles followed by radiation therapy by Dr. Sondra Come for her high-risk disease with her initial date of surgery in February 2008.   [name] Chemotherapy-induced neuropathy ([place]) 06/21/2016  . COPD (chronic obstructive pulmonary disease) ([place])   . Depression   . Emphysema of lung ([place])   . GERD (gastroesophageal reflux disease) 12/18/2012  . Headache   . Hyperlipidemia   .  Neuropathy   . Osteoporosis   . PONV (postoperative nausea and vomiting)   . PTSD (post-traumatic stress disorder)   . Substance abuse [facility])     Past Surgical History:  Procedure Laterality Date  . BREAST SURGERY    . ESOPHAGOGASTRODUODENOSCOPY  03/10/2008   SLF: Normal esophagus without evidence of Barrett, mass, erosion  ulceration, or stricture  . leocolonoscopy  03/10/2008   YQM:VHQION terminal ileum, approximately 10 cm visualized/Normal colon without evidence of polyps, mass, inflammatory changes, diverticula, or AVMs/Normal retroflexed view of the rectum/Patchy erythema in the antrum without erosion or ulceration  . MASTECTOMY     bilateral  . TUBAL LIGATION      Family Psychiatric History: Daughter has history of drug abuse and died of a drug overdose  Family History:  Family History  Problem Relation Age of Onset  . COPD Mother   . Depression Mother   . Heart disease Mother   . Hyperlipidemia Mother   . Hypertension Mother   . Cancer Mother        lung cancer, to bone  . Cancer Father        skin  . Heart disease Father   . Hypertension Father   . Hyperlipidemia Father   . Learning disabilities Father   . Drug abuse Brother        overdose at 46  . Drug abuse Daughter        drug overdose 35  . Cancer Maternal Uncle        lung cancer    Social History:  Social History   Social History  . Marital status: Divorced    Spouse name: N/A  . Number of children: 2  . Years of education: 13   Occupational History  . disabled     neuropathy from chemo   Social History Main Topics  . Smoking status: Current Every Day Smoker    Packs/day: 0.50    Types: Cigarettes    Start date: 05/07/1992  . Smokeless tobacco: Never Used     Comment: discussed  . Alcohol use 3.6 oz/week    6 Shots of liquor per week     Comment: occ  . Drug use: No  . Sexual activity: Yes    Birth control/ protection: Post-menopausal   Other Topics Concern  . None   Social  History Narrative   Divorced   Lives alone   Spends time with boyfriend - he is a smoker    Allergies:  Allergies  Allergen Reactions  . Vicodin [Hydrocodone-Acetaminophen] Nausea Only    Metabolic Disorder Labs: No results found for: HGBA1C, MPG No results found for: PROLACTIN Lab Results  Component Value Date   CHOL 206 (H) 11/23/2016   TRIG 210 (H) 11/23/2016   HDL 44 (L) 11/23/2016   CHOLHDL 4.7 11/23/2016   VLDL 42 (H) 11/23/2016   LDLCALC 120 (H) 11/23/2016   LDLCALC 89 07/19/2016   Lab Results  Component Value Date   TSH 2.26 11/29/2015   TSH 1.197 03/28/2010    Therapeutic Level Labs: No results found for: LITHIUM No results found for: VALPROATE No components found for:  CBMZ  Current Medications: Current Outpatient Prescriptions  Medication Sig Dispense Refill  . varenicline (CHANTIX) 0.5 MG tablet Take 1 tablet (0.5 mg total) by mouth 2 (two) times daily. 60 tablet 0  . albuterol (PROVENTIL HFA;VENTOLIN HFA) 108 (90 Base) MCG/ACT inhaler Inhale 2 puffs into the lungs every 6 (six) hours as needed for wheezing or shortness of breath. 1 Inhaler 5  . clotrimazole-betamethasone (LOTRISONE) cream Apply 1 application topically 2 (two) times daily. To corner of mouth 30 g 0  . diazepam (VALIUM) 10 MG tablet Take 1 tablet (10 mg total) by mouth 3 (three) times daily. 90 tablet 2  . DULoxetine (CYMBALTA) 60 MG capsule Take 1 capsule (60 mg total) by mouth 2 (two) times daily. 180 capsule 2  . fluticasone-salmeterol (ADVAIR HFA) 115-21 MCG/ACT inhaler Inhale 2 puffs into the lungs 2 (two) times daily.    [name] omeprazole (PRILOSEC) 20 MG capsule Take 1 capsule (20 mg total) by mouth daily. 90 capsule 3  . pravastatin (PRAVACHOL) 20 MG tablet Take 1 tablet (20 mg total) by mouth at bedtime. 90 tablet 3  . traZODone (DESYREL) 100 MG tablet Take 2 tablets (200 mg total) by mouth at bedtime. 60 tablet 2  . triamcinolone cream (KENALOG) 0.1 % Apply 1 application topically 2  (two) times daily. Until rash clears 30 g 1  . varenicline (CHANTIX CONTINUING MONTH PAK) 1 MG tablet Take 1 tablet (1 mg total) by mouth 2 (two) times daily. 60 tablet 2   No current facility-administered medications for this visit.      Musculoskeletal: Strength & Muscle Tone: within normal limits Gait & Station: normal Patient leans: N/A  Psychiatric Specialty Exam: Review of Systems  Psychiatric/Behavioral: Positive for depression. The patient is nervous/anxious.   All other systems reviewed and are negative.   Blood pressure 123/61, pulse 89, weight 189 lb (85.7 kg), last menstrual period 07/09/2013.Body mass index is 32.44 kg/m.  General Appearance: Casual, Neat and Well Groomed  Eye Contact:  Good  Speech:  Clear and Coherent  Volume:  Normal  Mood:  Dysphoric  Affect:  Full Range  Thought Process:  Goal Directed  Orientation:  Full (Time, Place, and Person)  Thought Content: Rumination   Suicidal Thoughts:  No  Homicidal Thoughts:  No  Memory:  Immediate;   Good Recent;   Fair Remote;   Fair  Judgement:  Fair  Insight:  Fair  Psychomotor Activity:  Normal  Concentration:  Concentration: Good and Attention Span: Good  Recall:  Good  Fund of Knowledge: Good  Language: Good  Akathisia:  No  Handed:  Right  AIMS (if indicated): not done  Assets:  Communication Skills Desire for Improvement Resilience Social Support Talents/Skills  ADL's:  Intact  Cognition: WNL  Sleep:  Good   Screenings: PHQ2-9     Office Visit from 11/23/2016 in [facility] Office Visit from 06/21/2016 in [facility]  PHQ-2 Total Score  0  4  PHQ-9 Total Score  -  14       Assessment and Plan: This patient is a 56 year old white female who has a history of depression and anxiety. This is been particular worsened since the death of her daughter and mother. She still has low days but for the most part she  is doing better. She is standing up to her controlling  boyfriend. She does state that the Cymbalta has helped her mood stabilized, Ambien helps her sleep and Valium also helps with her anxiety. Counseling was strongly encouraged but she claims she cannot afford it at this time. She'll return to see me in 3 months   [name], MD 02/27/2017, 1:58 PM

## 2017-04-18 ENCOUNTER — Telehealth: Payer: Self-pay | Admitting: Family Medicine

## 2017-04-18 MED ORDER — FLUTICASONE-SALMETEROL 115-21 MCG/ACT IN AERO: 2.0000 | INHALATION_SPRAY | Freq: Two times a day (BID) | RESPIRATORY_TRACT | 5 refills | Status: DC

## 2017-04-18 MED ORDER — ALBUTEROL SULFATE HFA 108 (90 BASE) MCG/ACT IN AERS
2.0000 | INHALATION_SPRAY | Freq: Four times a day (QID) | RESPIRATORY_TRACT | 5 refills | Status: DC | PRN
Start: 2017-04-18 — End: 2019-12-21

## 2017-04-18 NOTE — Telephone Encounter (Signed)
Seen 7 20 18

## 2017-04-18 NOTE — Telephone Encounter (Signed)
Pt called and needs Inhaler called in -Generic Albuterol, Advair,  [facility] in [place]

## 2017-05-13 DIAGNOSIS — H1045 Other chronic allergic conjunctivitis: Secondary | ICD-10-CM | POA: Diagnosis not present

## 2017-05-13 DIAGNOSIS — H2513 Age-related nuclear cataract, bilateral: Secondary | ICD-10-CM | POA: Diagnosis not present

## 2017-05-13 DIAGNOSIS — H52223 Regular astigmatism, bilateral: Secondary | ICD-10-CM | POA: Diagnosis not present

## 2017-05-13 DIAGNOSIS — H5203 Hypermetropia, bilateral: Secondary | ICD-10-CM | POA: Diagnosis not present

## 2017-05-30 ENCOUNTER — Encounter (HOSPITAL_COMMUNITY): Payer: Self-pay | Admitting: Internal Medicine

## 2017-05-30 ENCOUNTER — Other Ambulatory Visit: Payer: Self-pay

## 2017-05-30 ENCOUNTER — Inpatient Hospital Stay (HOSPITAL_COMMUNITY): Payer: Medicare Other | Attending: Internal Medicine | Admitting: Internal Medicine

## 2017-05-30 VITALS — BP 96/78 | HR 95 | Temp 97.9°F | Resp 16 | Ht 64.0 in | Wt 194.0 lb

## 2017-05-30 DIAGNOSIS — F329 Major depressive disorder, single episode, unspecified: Secondary | ICD-10-CM | POA: Diagnosis not present

## 2017-05-30 DIAGNOSIS — F4310 Post-traumatic stress disorder, unspecified: Secondary | ICD-10-CM | POA: Diagnosis not present

## 2017-05-30 DIAGNOSIS — C50919 Malignant neoplasm of unspecified site of unspecified female breast: Secondary | ICD-10-CM

## 2017-05-30 DIAGNOSIS — Z853 Personal history of malignant neoplasm of breast: Secondary | ICD-10-CM | POA: Diagnosis not present

## 2017-05-30 NOTE — Progress Notes (Signed)
HEM/ONC FOLLOW UP NOTE ([place])  CHIEF COMPLAINT: .followup of  Stage II (T1c N1 M0) grade 3 triple negative breast cancer, right  HISTORY OF PRESENT ILLNESS:[name] 57 y.o. female returns for  regular  visit for followup of  Stage II (T1c N1 M0) grade 3 triple negative breast cancer, right- sided with 1 of 5 positive nodes, status post FEC in a dose dense fashion for 6 cycles followed by radiation therapy by Dr. Sondra Come for her high-risk disease with her initial date of surgery in February 2008.   Status post bilateral mastectomy. Tissue expander placed March 2018 breast implant exchange with silicone in June 1224  INTERVAL HISTORY: Recent removal of the old implants and replacement by silicone implants healing well  Continues to smoke. Denes new bone pains, fevers, no new lumps.  Chest x-ray from June 21 2016-. Labs 11/23/2016 CMP unremarkable CBC normal.  ROS As in [facility], .ros Physical Exam  Vitals:   05/30/17 1459  Weight: 194 lb (88 kg)  Height: 5\' 4"  (1.626 m)   Breast exam shows bilateral mastectomy no axillary lymph nodes bilateral implants in place  Extremities without edema. aaox3 No lymphadenopathy in the neck or axillae  ASSESSMENT AND PLAN:  Breast cancer history. NED return for follow-up in 1 year prefers to follow-up with as even though I offered her to discharge to primary care. Posttraumatic stress disorder, Major depressive disorder.  Well controlled on medications, seeing psychiatry.

## 2017-07-04 DIAGNOSIS — H2513 Age-related nuclear cataract, bilateral: Secondary | ICD-10-CM | POA: Diagnosis not present

## 2017-07-08 ENCOUNTER — Encounter: Payer: Self-pay | Admitting: Gastroenterology

## 2017-07-08 ENCOUNTER — Encounter: Payer: Self-pay | Admitting: Family Medicine

## 2017-07-08 ENCOUNTER — Ambulatory Visit (INDEPENDENT_AMBULATORY_CARE_PROVIDER_SITE_OTHER): Payer: Medicare Other | Admitting: Psychiatry

## 2017-07-08 ENCOUNTER — Encounter (HOSPITAL_COMMUNITY): Payer: Self-pay | Admitting: Psychiatry

## 2017-07-08 ENCOUNTER — Ambulatory Visit (INDEPENDENT_AMBULATORY_CARE_PROVIDER_SITE_OTHER): Payer: Medicare Other | Admitting: Family Medicine

## 2017-07-08 VITALS — BP 132/80 | HR 86 | Temp 99.7°F | Ht 64.0 in | Wt 192.8 lb

## 2017-07-08 VITALS — BP 110/79 | HR 88 | Ht 64.0 in | Wt 190.0 lb

## 2017-07-08 DIAGNOSIS — Z114 Encounter for screening for human immunodeficiency virus [HIV]: Secondary | ICD-10-CM | POA: Diagnosis not present

## 2017-07-08 DIAGNOSIS — Z915 Personal history of self-harm: Secondary | ICD-10-CM

## 2017-07-08 DIAGNOSIS — Z818 Family history of other mental and behavioral disorders: Secondary | ICD-10-CM | POA: Diagnosis not present

## 2017-07-08 DIAGNOSIS — Z736 Limitation of activities due to disability: Secondary | ICD-10-CM

## 2017-07-08 DIAGNOSIS — E785 Hyperlipidemia, unspecified: Secondary | ICD-10-CM | POA: Diagnosis not present

## 2017-07-08 DIAGNOSIS — R194 Change in bowel habit: Secondary | ICD-10-CM

## 2017-07-08 DIAGNOSIS — G4700 Insomnia, unspecified: Secondary | ICD-10-CM

## 2017-07-08 DIAGNOSIS — H6983 Other specified disorders of Eustachian tube, bilateral: Secondary | ICD-10-CM

## 2017-07-08 DIAGNOSIS — Z9109 Other allergy status, other than to drugs and biological substances: Secondary | ICD-10-CM | POA: Diagnosis not present

## 2017-07-08 DIAGNOSIS — F332 Major depressive disorder, recurrent severe without psychotic features: Secondary | ICD-10-CM | POA: Diagnosis not present

## 2017-07-08 DIAGNOSIS — Z1159 Encounter for screening for other viral diseases: Secondary | ICD-10-CM | POA: Diagnosis not present

## 2017-07-08 DIAGNOSIS — Z813 Family history of other psychoactive substance abuse and dependence: Secondary | ICD-10-CM | POA: Diagnosis not present

## 2017-07-08 DIAGNOSIS — F1010 Alcohol abuse, uncomplicated: Secondary | ICD-10-CM | POA: Diagnosis not present

## 2017-07-08 DIAGNOSIS — F17210 Nicotine dependence, cigarettes, uncomplicated: Secondary | ICD-10-CM | POA: Diagnosis not present

## 2017-07-08 DIAGNOSIS — Z634 Disappearance and death of family member: Secondary | ICD-10-CM | POA: Diagnosis not present

## 2017-07-08 DIAGNOSIS — E559 Vitamin D deficiency, unspecified: Secondary | ICD-10-CM | POA: Diagnosis not present

## 2017-07-08 MED ORDER — TRAZODONE HCL 100 MG PO TABS: 300.0000 mg | ORAL_TABLET | Freq: Every day | ORAL | 2 refills | Status: DC

## 2017-07-08 MED ORDER — DIAZEPAM 10 MG PO TABS: 10.0000 mg | ORAL_TABLET | Freq: Three times a day (TID) | ORAL | 2 refills | Status: DC

## 2017-07-08 MED ORDER — TRIAMCINOLONE ACETONIDE 55 MCG/ACT NA AERO: 2.0000 | INHALATION_SPRAY | Freq: Every day | NASAL | 12 refills | Status: DC

## 2017-07-08 MED ORDER — DULOXETINE HCL 60 MG PO CPEP: 60.0000 mg | ORAL_CAPSULE | Freq: Two times a day (BID) | ORAL | 2 refills | Status: DC

## 2017-07-08 NOTE — Patient Instructions (Signed)
Need labs today I will send you a letter with your test results.  If there is anything of concern, we will call right away.  Use the nasacort daily This will help with the sinus drainage and ears I am sending you to a gastro specialist for the bowel changes  We will contact you about follow up

## 2017-07-08 NOTE — Progress Notes (Signed)
BH MD/PA/NP OP Progress Note  07/08/2017 11:46 AM [name]  MRN:  [id number]  Chief Complaint:  Chief Complaint    Depression; Anxiety; Follow-up     HPI: this patient is a 57 year old divorced white female who lives alone in [place]. She has one son age 62. She had a daughter who died at age 68 in 09/01/11 of a narcotic overdose. The patient is on disability. She is self-referred.  The patient states that she began getting depressed in her early 58s. She admits to suicide attempts but was never hospitalized but was treated in outpatient clinics. She's been on numerous medications over the years including Celexa, Abilify, Zoloft, amitriptyline, clonazepam and Xanax. She used to go to [place] and families followed by a clinic in [place] and most recently was prescribed medication by her physician at [facility]. Her physician left and she no longer has access to psychiatric medications. She was on a combination of Celexa, Abilify and clonazepam. She's been off medication for 6 months.  The patient states that her depression got considerably worse after her daughter died in 09/01/11. The patient witnessed her daughter's death and did not knowwhat was going on. She watched her get drowsy through the entire day but didn't realize that she was going through an overdose. By the time she called 911 it was too late. The patient blames herself for her daughter's death. She has nightmares and flashbacks about the events of that day and can't stop thinking about that. She has not had any counseling since her daughter died.  Currently she cries all the time and has no energy. She does not eat well. She is frightened by the house and has constant panic attacks. Her sleep is disrupted by nightmares. She does not hear voices but sometimes hears noises. She's been trying to cope by drinking several shots of alcohol every few days. She claims she used to smoke marijuana but hasn't for one month. She  does not use other drugs. She has a boyfriend but is isolating herself from him and does not do much with other friends or family members. She has passive suicidal ideation and "wishes I would die and be with my daughter" but denies any thoughts of self-harm.  Patient returns after 3 months.  For the most part she is doing okay.  She went to the breast reconstruction and had her implant removed and replaced.  She spends most of her time taking care of her boyfriend who has significant back problems and her 19 year old father.  She does not spend much time on herself.  She states that she feels "like a shell but empty inside".  I suggested she get into the counseling here but she declined claiming she cannot afford it.  She does think the medicines are helpful but she is somewhat overworked.  She is not sleeping well at night and perhaps if we can get this improved she will feel better.  She likes the trazodone because it has stopped her nightmares but perhaps it is not strong enough Visit Diagnosis:    ICD-10-CM   1. Major depressive disorder, recurrent, severe without psychotic features ([place]) F33.2     Past Psychiatric History: Long history of outpatient treatment  Past Medical History:  Past Medical History:  Diagnosis Date  . Allergy   . Anxiety   . Blood transfusion without reported diagnosis   . Breast cancer ([place])   . Breast cancer ([place]) 04/21/2012   Stage II (T1c N1 M0)  grade 3 triple negative breast cancer, right- sided with 1 of 5 positive nodes, status post FEC in a dose dense fashion for 6 cycles followed by radiation therapy by Dr. Sondra Come for her high-risk disease with her initial date of surgery in February 2008.   [name] Chemotherapy-induced neuropathy ([place]) 06/21/2016  . COPD (chronic obstructive pulmonary disease) ([place])   . Depression   . Emphysema of lung ([place])   . GERD (gastroesophageal reflux disease) 12/18/2012  . Headache   . Hyperlipidemia   . Neuropathy   . Osteoporosis    . PONV (postoperative nausea and vomiting)   . PTSD (post-traumatic stress disorder)   . Substance abuse [facility])     Family Psychiatric History: See below  Family History:  Family History  Problem Relation Age of Onset  . COPD Mother   . Depression Mother   . Heart disease Mother   . Hyperlipidemia Mother   . Hypertension Mother   . Cancer Mother        lung cancer, to bone  . Cancer Father        skin  . Heart disease Father   . Hypertension Father   . Hyperlipidemia Father   . Learning disabilities Father   . Drug abuse Brother        overdose at 48  . Drug abuse Daughter        drug overdose 41  . Cancer Maternal Uncle        lung cancer    Social History:  Social History   Socioeconomic History  . Marital status: Divorced    Spouse name: None  . Number of children: 2  . Years of education: 52  . Highest education level: None  Social Needs  . Financial resource strain: None  . Food insecurity - worry: None  . Food insecurity - inability: None  . Transportation needs - medical: None  . Transportation needs - non-medical: None  Occupational History  . Occupation: disabled    Comment: neuropathy from chemo  Tobacco Use  . Smoking status: Current Every Day Smoker    Packs/day: 0.50    Types: Cigarettes    Start date: 05/07/1992  . Smokeless tobacco: Never Used  . Tobacco comment: discussed  Substance and Sexual Activity  . Alcohol use: Yes    Alcohol/week: 3.6 oz    Types: 6 Shots of liquor per week    Comment: occ  . Drug use: No  . Sexual activity: Yes    Birth control/protection: Post-menopausal  Other Topics Concern  . None  Social History Narrative   Divorced   Lives alone   Spends time with boyfriend - he is a smoker    Allergies:  Allergies  Allergen Reactions  . Vicodin [Hydrocodone-Acetaminophen] Nausea Only    Metabolic Disorder Labs: No results found for: HGBA1C, MPG No results found for: PROLACTIN Lab Results  Component Value  Date   CHOL 206 (H) 11/23/2016   TRIG 210 (H) 11/23/2016   HDL 44 (L) 11/23/2016   CHOLHDL 4.7 11/23/2016   VLDL 42 (H) 11/23/2016   LDLCALC 120 (H) 11/23/2016   LDLCALC 89 07/19/2016   Lab Results  Component Value Date   TSH 2.26 11/29/2015   TSH 1.197 03/28/2010    Therapeutic Level Labs: No results found for: LITHIUM No results found for: VALPROATE No components found for:  CBMZ  Current Medications: Current Outpatient Medications  Medication Sig Dispense Refill  . albuterol (PROVENTIL HFA;VENTOLIN HFA) 108 (90  Base) MCG/ACT inhaler Inhale 2 puffs into the lungs every 6 (six) hours as needed for wheezing or shortness of breath. 1 Inhaler 5  . cholecalciferol (VITAMIN D) 1000 units tablet Take 1,000 Units by mouth daily.    . clotrimazole-betamethasone (LOTRISONE) cream Apply 1 application topically 2 (two) times daily. To corner of mouth 30 g 0  . diazepam (VALIUM) 10 MG tablet Take 1 tablet (10 mg total) by mouth 3 (three) times daily. 90 tablet 2  . DULoxetine (CYMBALTA) 60 MG capsule Take 1 capsule (60 mg total) by mouth 2 (two) times daily. 180 capsule 2  . fluticasone-salmeterol (ADVAIR HFA) 115-21 MCG/ACT inhaler Inhale 2 puffs into the lungs 2 (two) times daily. 1 Inhaler 5  . omeprazole (PRILOSEC) 20 MG capsule Take 1 capsule (20 mg total) by mouth daily. 90 capsule 3  . pravastatin (PRAVACHOL) 20 MG tablet Take 1 tablet (20 mg total) by mouth at bedtime. 90 tablet 3  . traZODone (DESYREL) 100 MG tablet Take 3 tablets (300 mg total) by mouth at bedtime. 90 tablet 2  . triamcinolone (NASACORT) 55 MCG/ACT AERO nasal inhaler Place 2 sprays into the nose daily. 1 Inhaler 12  . triamcinolone cream (KENALOG) 0.1 % Apply 1 application topically 2 (two) times daily. Until rash clears 30 g 1  . vitamin C (ASCORBIC ACID) 500 MG tablet Take 500 mg by mouth daily.     No current facility-administered medications for this visit.      Musculoskeletal: Strength & Muscle Tone:  within normal limits Gait & Station: normal Patient leans: N/A  Psychiatric Specialty Exam: Review of Systems  Eyes: Positive for blurred vision.  Gastrointestinal: Positive for melena.  Psychiatric/Behavioral: The patient has insomnia.   All other systems reviewed and are negative.   Blood pressure 110/79, pulse 88, height 5\' 4"  (1.626 m), weight 190 lb (86.2 kg), last menstrual period 07/09/2013, SpO2 94 %.Body mass index is 32.61 kg/m.  General Appearance: Casual and Fairly Groomed  Eye Contact:  Good  Speech:  Clear and Coherent  Volume:  Normal  Mood:  Anxious  Affect:  Congruent  Thought Process:  Goal Directed  Orientation:  Full (Time, Place, and Person)  Thought Content: Rumination   Suicidal Thoughts:  No  Homicidal Thoughts:  No  Memory:  Immediate;   Good Recent;   Good Remote;   Fair  Judgement:  Fair  Insight:  Fair  Psychomotor Activity:  Normal  Concentration:  Concentration: Good and Attention Span: Good  Recall:  Good  Fund of Knowledge: Fair  Language: Good  Akathisia:  No  Handed:  Right  AIMS (if indicated): not done  Assets:  Communication Skills Desire for Improvement Resilience Social Support Talents/Skills  ADL's:  Intact  Cognition: WNL  Sleep:  Poor   Screenings: PHQ2-9     Office Visit from 11/23/2016 in [facility] Office Visit from 06/21/2016 in [facility]  PHQ-2 Total Score  0  4  PHQ-9 Total Score  No data  14       Assessment and Plan: This patient is a 57 year old female with a history of depression and anxiety.  She has lost her mother and her daughter in recent years and this has brought her down considerably.  For now she is functioning fairly well although she tends to take care of everyone else but herself.  I suggested counseling but she declined today.  She is not sleeping well so we will increase trazodone to 300  mg at bedtime.  For now she will continue Cymbalta 60 mg twice a day for depression  and Valium 10 mg 3 times a day for anxiety.  She will return to see me in 3 months or call sooner if needed   [name], MD 07/08/2017, 11:46 AM

## 2017-07-08 NOTE — Progress Notes (Signed)
Chief Complaint  Patient presents with  . Acute Visit    ear infections(both)(duration:1 month), BM's have been black in color, muscle pain in right arm/right leg   Patient is here for an acute visit.  She called and asked for a visit for her ears.  She states that they have been feeling plugged, and sometimes painful.  She has trouble clearing her ears.  She has a lot of postnasal drip and sinus congestion.  She feels like sometimes her ear is draining.  She does not have any fever or chills.  No current cold or sinus infection. She also complains about a change in her bowel habits.  She states she moves her bowels every day to every other day.  She had a few days last week where her bowel movements looked black.  Now they are back to brown.  She knows that this could be blood in her bowels.  She is due for colonoscopy in April.  I am going to go ahead and order a gastroenterology consult today.  She may need an EGD and colonoscopy.  We will also go to get some blood work to check a blood count.  I have ordered stool for occult blood. She complains that pain in her right shoulder.  Complains of pain in her right leg.  She states her shoulder hurts if she lifts it overhead for period of time, it sore when she tries to bring it back down.  She states she can shake it out and this pain will go away.  I told her simply do not keep your arm above your head long enough to cause pain, otherwise her arm is fully functional without discomfort.  The leg pain appears to be the right lateral calf region.  This pain comes and goes.  It feels like it is a muscle.  She is worried about a blood clot.  She has no swelling, no pain with ambulation.  We discussed that her blood clot would not cause intermittent pain for a month. She does have other medical problems that need to be updated.  Does not need mammograms due to double mastectomy.  Immunizations are up-to-date.  She feels she had a tetanus in the last 10  years.  We will do a screening lab work today to include hepatitis C and HIV since this is recommended.  She agrees to this additional testing.  Patient Active Problem List   Diagnosis Date Noted  . Prediabetes 12/14/2016  . 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 COPD mixed type ([place]) 06/21/2016  . HLD (hyperlipidemia) 06/21/2016  . Vitamin D deficiency 06/21/2016  . Post traumatic stress disorder (PTSD) 06/21/2016  . Angular cheilitis 06/21/2016  . Major depression 02/05/2014  . Tobacco abuse 12/18/2012  . GERD (gastroesophageal reflux disease) 12/18/2012  . Breast cancer ([place]) 04/21/2012    Outpatient Encounter Medications as of 07/08/2017  Medication Sig  . albuterol (PROVENTIL HFA;VENTOLIN HFA) 108 (90 Base) MCG/ACT inhaler Inhale 2 puffs into the lungs every 6 (six) hours as needed for wheezing or shortness of breath.  . cholecalciferol (VITAMIN D) 1000 units tablet Take 1,000 Units by mouth daily.  . clotrimazole-betamethasone (LOTRISONE) cream Apply 1 application topically 2 (two) times daily. To corner of mouth  . fluticasone-salmeterol (ADVAIR HFA) 115-21 MCG/ACT inhaler Inhale 2 puffs into the lungs 2 (two) times daily.  [name] omeprazole (PRILOSEC) 20 MG capsule Take 1 capsule (20 mg total) by mouth daily.  . pravastatin (PRAVACHOL) 20 MG  tablet Take 1 tablet (20 mg total) by mouth at bedtime.  . triamcinolone cream (KENALOG) 0.1 % Apply 1 application topically 2 (two) times daily. Until rash clears  . vitamin C (ASCORBIC ACID) 500 MG tablet Take 500 mg by mouth daily.  [name] triamcinolone (NASACORT) 55 MCG/ACT AERO nasal inhaler Place 2 sprays into the nose daily.   No facility-administered encounter medications on file as of 07/08/2017.     Allergies  Allergen Reactions  . Vicodin [Hydrocodone-Acetaminophen] Nausea Only    Review of Systems  Constitutional: Negative for activity change, appetite change and unexpected weight change.  HENT: Positive for congestion,  ear pain, postnasal drip and rhinorrhea. Negative for dental problem.   Eyes: Negative for redness and visual disturbance.  Respiratory: Negative for cough and shortness of breath.   Cardiovascular: Negative for chest pain, palpitations and leg swelling.  Gastrointestinal: Negative for abdominal pain, constipation and diarrhea.       Question blood in stool, dark colored stools  Genitourinary: Negative for difficulty urinating, frequency and menstrual problem.  Musculoskeletal: Negative for arthralgias and back pain.       Pain right shoulder, pain right leg see HPI  Neurological: Negative for dizziness and headaches.  Psychiatric/Behavioral: Negative for dysphoric mood and sleep disturbance. The patient is not nervous/anxious.     BP 132/80 (BP Location: Left Arm, Patient Position: Sitting, Cuff Size: Normal)   Pulse 86   Temp 99.7 F (37.6 C) (Temporal)   Ht 5\' 4"  (1.626 m)   Wt 192 lb 12 oz (87.4 kg)   LMP 07/09/2013   SpO2 96%   BMI 33.09 kg/m   Physical Exam  Constitutional: She is oriented to person, place, and time. She appears well-developed and well-nourished. No distress.  HENT:  Head: Normocephalic and atraumatic.  Right Ear: External ear normal.  Left Ear: External ear normal.  Mouth/Throat: Oropharynx is clear and moist.  Ear canals and TMs are clear.  Mild nasal congestion, pale membranes.  Eyes: Conjunctivae are normal. Pupils are equal, round, and reactive to light.  Neck: Normal range of motion.  Cardiovascular: Normal rate and regular rhythm.  Pulmonary/Chest: Effort normal and breath sounds normal.  Abdominal: Soft. Bowel sounds are normal. She exhibits no mass. There is no tenderness. There is no guarding.  Musculoskeletal:  Exam of right shoulder normal with good range of motion.  Examination of right lower extremity, hip knee ankle normal.  No calf tenderness.  No edema  Lymphadenopathy:    She has no cervical adenopathy.  Neurological: She is alert and  oriented to person, place, and time.  Skin:  Face has erythema across forehead/eyebrows-chronic seborrhea  Psychiatric: She has a normal mood and affect. Her behavior is normal.    ASSESSMENT/PLAN:  1. Change in bowel habits Refer to gastroenterology.  May need EGD as well as colon at goal. - Ambulatory referral to Gastroenterology - COMPLETE METABOLIC PANEL WITH GFR - CBC  2. Eustachian tube dysfunction, bilateral Discussed.  Prescribed Nasonex.  3. Environmental allergies As above  4. Hyperlipidemia, unspecified hyperlipidemia type Is on medication.  Her last lipid profile was not at goal - Lipid panel  5. Encounter for screening for HIV Tested - HIV antibody  6. Encounter for hepatitis C screening test for low risk patient Tested - Hepatitis C antibody  7. Vitamin D deficiency Prior history - VITAMIN D 25 Hydroxy (Vit-D Deficiency, Fractures)   Patient Instructions  Need labs today I will send you a letter with your  test results.  If there is anything of concern, we will call right away.  Use the nasacort daily This will help with the sinus drainage and ears I am sending you to a gastro specialist for the bowel changes  We will contact you about follow up     Raylene Everts, MD

## 2017-07-09 LAB — CBC
HCT: 40.4 % (ref 35.0–45.0)
Hemoglobin: 14.1 g/dL (ref 11.7–15.5)
MCH: 31.4 pg (ref 27.0–33.0)
MCHC: 34.9 g/dL (ref 32.0–36.0)
MCV: 90 fL (ref 80.0–100.0)
MPV: 9.4 fL (ref 7.5–12.5)
PLATELETS: 312 10*3/uL (ref 140–400)
RBC: 4.49 10*6/uL (ref 3.80–5.10)
RDW: 12.5 % (ref 11.0–15.0)
WBC: 10.2 10*3/uL (ref 3.8–10.8)

## 2017-07-09 LAB — COMPLETE METABOLIC PANEL WITH GFR
AG RATIO: 1.9 (calc) (ref 1.0–2.5)
ALKALINE PHOSPHATASE (APISO): 66 U/L (ref 33–130)
ALT: 17 U/L (ref 6–29)
AST: 17 U/L (ref 10–35)
Albumin: 4.6 g/dL (ref 3.6–5.1)
BUN: 10 mg/dL (ref 7–25)
CALCIUM: 9.5 mg/dL (ref 8.6–10.4)
CO2: 29 mmol/L (ref 20–32)
CREATININE: 0.64 mg/dL (ref 0.50–1.05)
Chloride: 104 mmol/L (ref 98–110)
GFR, Est African American: 116 mL/min/{1.73_m2} (ref >=60)
GFR, Est Non African American: 100 mL/min/{1.73_m2} (ref >=60)
Globulin: 2.4 g/dL (calc) (ref 1.9–3.7)
Glucose, Bld: 112 mg/dL (ref 65–139)
POTASSIUM: 4.4 mmol/L (ref 3.5–5.3)
SODIUM: 139 mmol/L (ref 135–146)
Total Bilirubin: 0.4 mg/dL (ref 0.2–1.2)
Total Protein: 7 g/dL (ref 6.1–8.1)

## 2017-07-09 LAB — LIPID PANEL
CHOLESTEROL: 177 mg/dL (ref <200)
HDL: 52 mg/dL (ref >50)
LDL Cholesterol (Calc): 103 mg/dL (calc) — ABNORMAL HIGH
Non-HDL Cholesterol (Calc): 125 mg/dL (calc) (ref <130)
TRIGLYCERIDES: 120 mg/dL (ref <150)
Total CHOL/HDL Ratio: 3.4 (calc) (ref <5.0)

## 2017-07-09 LAB — HEPATITIS C ANTIBODY
Hepatitis C Ab: NONREACTIVE
SIGNAL TO CUT-OFF: 0.05 (ref <1.00)

## 2017-07-09 LAB — VITAMIN D 25 HYDROXY (VIT D DEFICIENCY, FRACTURES): Vit D, 25-Hydroxy: 29 ng/mL — ABNORMAL LOW (ref 30–100)

## 2017-07-09 LAB — HIV ANTIBODY (ROUTINE TESTING W REFLEX): HIV 1&2 Ab, 4th Generation: NONREACTIVE

## 2017-07-10 ENCOUNTER — Encounter: Payer: Self-pay | Admitting: Family Medicine

## 2017-07-15 ENCOUNTER — Encounter: Payer: Self-pay | Admitting: Family Medicine

## 2017-07-29 ENCOUNTER — Telehealth: Payer: Self-pay | Admitting: Family Medicine

## 2017-07-29 NOTE — Telephone Encounter (Signed)
Pt is requesting refill on pravastatin (PRAVACHOL) 20 MG tablet [[id number]]  Please send to [facility]

## 2017-07-30 ENCOUNTER — Other Ambulatory Visit: Payer: Self-pay

## 2017-07-30 MED ORDER — PRAVASTATIN SODIUM 20 MG PO TABS: 20.0000 mg | ORAL_TABLET | Freq: Every day | ORAL | 0 refills | Status: DC

## 2017-08-08 DIAGNOSIS — H2511 Age-related nuclear cataract, right eye: Secondary | ICD-10-CM | POA: Diagnosis not present

## 2017-08-08 DIAGNOSIS — H2512 Age-related nuclear cataract, left eye: Secondary | ICD-10-CM | POA: Diagnosis not present

## 2017-08-12 ENCOUNTER — Encounter (HOSPITAL_COMMUNITY): Payer: Self-pay

## 2017-08-12 ENCOUNTER — Encounter (HOSPITAL_COMMUNITY)
Admission: RE | Admit: 2017-08-12 | Discharge: 2017-08-12 | Disposition: A | Discharge status: Home / Self Care | Payer: Medicare Other | Source: Ambulatory Visit | Attending: Ophthalmology | Admitting: Ophthalmology

## 2017-08-15 ENCOUNTER — Ambulatory Visit (HOSPITAL_COMMUNITY)
Admission: RE | Admit: 2017-08-15 | Discharge: 2017-08-15 | Disposition: A | Discharge status: Home / Self Care | Payer: Medicare Other | Source: Ambulatory Visit | Attending: Ophthalmology | Admitting: Ophthalmology

## 2017-08-15 ENCOUNTER — Ambulatory Visit (HOSPITAL_COMMUNITY): Payer: Medicare Other | Admitting: Certified Registered Nurse Anesthetist

## 2017-08-15 ENCOUNTER — Encounter (HOSPITAL_COMMUNITY)
Admission: RE | Disposition: A | Discharge status: Home / Self Care | Payer: Self-pay | Source: Ambulatory Visit | Attending: Ophthalmology

## 2017-08-15 ENCOUNTER — Encounter (HOSPITAL_COMMUNITY): Payer: Self-pay | Admitting: *Deleted

## 2017-08-15 DIAGNOSIS — Z79899 Other long term (current) drug therapy: Secondary | ICD-10-CM | POA: Insufficient documentation

## 2017-08-15 DIAGNOSIS — Z7951 Long term (current) use of inhaled steroids: Secondary | ICD-10-CM | POA: Insufficient documentation

## 2017-08-15 DIAGNOSIS — F17200 Nicotine dependence, unspecified, uncomplicated: Secondary | ICD-10-CM | POA: Insufficient documentation

## 2017-08-15 DIAGNOSIS — H2512 Age-related nuclear cataract, left eye: Secondary | ICD-10-CM | POA: Diagnosis not present

## 2017-08-15 DIAGNOSIS — Z853 Personal history of malignant neoplasm of breast: Secondary | ICD-10-CM | POA: Insufficient documentation

## 2017-08-15 DIAGNOSIS — J449 Chronic obstructive pulmonary disease, unspecified: Secondary | ICD-10-CM | POA: Insufficient documentation

## 2017-08-15 DIAGNOSIS — F419 Anxiety disorder, unspecified: Secondary | ICD-10-CM | POA: Diagnosis not present

## 2017-08-15 DIAGNOSIS — Z9013 Acquired absence of bilateral breasts and nipples: Secondary | ICD-10-CM | POA: Diagnosis not present

## 2017-08-15 DIAGNOSIS — Z923 Personal history of irradiation: Secondary | ICD-10-CM | POA: Insufficient documentation

## 2017-08-15 DIAGNOSIS — K219 Gastro-esophageal reflux disease without esophagitis: Secondary | ICD-10-CM | POA: Diagnosis not present

## 2017-08-15 DIAGNOSIS — F329 Major depressive disorder, single episode, unspecified: Secondary | ICD-10-CM | POA: Diagnosis not present

## 2017-08-15 HISTORY — PX: CATARACT EXTRACTION W/PHACO: SHX586

## 2017-08-15 SURGERY — PHACOEMULSIFICATION, CATARACT, WITH IOL INSERTION
Anesthesia: Monitor Anesthesia Care | Laterality: Left

## 2017-08-15 MED ORDER — CYCLOPENTOLATE-PHENYLEPHRINE 0.2-1 % OP SOLN
1.0000 [drp] | OPHTHALMIC | Status: AC
  Administered 2017-08-15 (×3): 1 [drp] via OPHTHALMIC

## 2017-08-15 MED ORDER — PHENYLEPHRINE HCL 2.5 % OP SOLN
1.0000 [drp] | OPHTHALMIC | Status: AC
  Administered 2017-08-15 (×3): 1 [drp] via OPHTHALMIC

## 2017-08-15 MED ORDER — EPHEDRINE SULFATE 50 MG/ML IJ SOLN
INTRAMUSCULAR | Status: AC
  Filled 2017-08-15: qty 1

## 2017-08-15 MED ORDER — ONDANSETRON HCL 4 MG/2ML IJ SOLN
INTRAMUSCULAR | Status: DC | PRN
  Administered 2017-08-15: 4 mg via INTRAVENOUS

## 2017-08-15 MED ORDER — LACTATED RINGERS IV SOLN
INTRAVENOUS | Status: DC
  Administered 2017-08-15: 1000 mL via INTRAVENOUS

## 2017-08-15 MED ORDER — EPINEPHRINE PF 1 MG/ML IJ SOLN
INTRAOCULAR | Status: DC | PRN
  Administered 2017-08-15: 500 mL

## 2017-08-15 MED ORDER — EPINEPHRINE PF 1 MG/ML IJ SOLN
INTRAMUSCULAR | Status: AC
  Filled 2017-08-15: qty 2

## 2017-08-15 MED ORDER — MIDAZOLAM HCL 2 MG/2ML IJ SOLN
INTRAMUSCULAR | Status: AC
Start: 2017-08-15 — End: ?
  Filled 2017-08-15: qty 2

## 2017-08-15 MED ORDER — BSS IO SOLN
INTRAOCULAR | Status: DC | PRN
  Administered 2017-08-15: 15 mL

## 2017-08-15 MED ORDER — SUCCINYLCHOLINE CHLORIDE 20 MG/ML IJ SOLN
INTRAMUSCULAR | Status: AC
  Filled 2017-08-15: qty 1

## 2017-08-15 MED ORDER — PROVISC 10 MG/ML IO SOLN
INTRAOCULAR | Status: DC | PRN
  Administered 2017-08-15: 0.85 mL via INTRAOCULAR

## 2017-08-15 MED ORDER — FENTANYL CITRATE (PF) 250 MCG/5ML IJ SOLN
INTRAMUSCULAR | Status: DC | PRN
  Administered 2017-08-15 (×4): 25 ug via INTRAVENOUS

## 2017-08-15 MED ORDER — FENTANYL CITRATE (PF) 100 MCG/2ML IJ SOLN
INTRAMUSCULAR | Status: AC
  Filled 2017-08-15: qty 2

## 2017-08-15 MED ORDER — MIDAZOLAM HCL 2 MG/2ML IJ SOLN
INTRAMUSCULAR | Status: DC | PRN
  Administered 2017-08-15 (×2): 2 mg via INTRAVENOUS

## 2017-08-15 MED ORDER — NEOMYCIN-POLYMYXIN-DEXAMETH 3.5-10000-0.1 OP SUSP
OPHTHALMIC | Status: DC | PRN
  Administered 2017-08-15: 2 [drp] via OPHTHALMIC

## 2017-08-15 MED ORDER — LIDOCAINE HCL 3.5 % OP GEL
1.0000 "application " | Freq: Once | OPHTHALMIC | Status: AC
  Administered 2017-08-15: 1 via OPHTHALMIC

## 2017-08-15 MED ORDER — LACTATED RINGERS IV SOLN
INTRAVENOUS | Status: DC | PRN
  Administered 2017-08-15: 07:00:00 via INTRAVENOUS

## 2017-08-15 MED ORDER — MIDAZOLAM HCL 2 MG/2ML IJ SOLN
INTRAMUSCULAR | Status: AC
  Filled 2017-08-15: qty 2

## 2017-08-15 MED ORDER — EPINEPHRINE PF 1 MG/ML IJ SOLN
INTRAOCULAR | Status: DC | PRN
  Administered 2017-08-15: 1 mL via OPHTHALMIC

## 2017-08-15 MED ORDER — ONDANSETRON HCL 4 MG/2ML IJ SOLN
INTRAMUSCULAR | Status: AC
  Filled 2017-08-15: qty 2

## 2017-08-15 MED ORDER — SODIUM HYALURONATE 23 MG/ML IO SOLN
INTRAOCULAR | Status: DC | PRN
  Administered 2017-08-15: 0.6 mL via INTRAOCULAR

## 2017-08-15 MED ORDER — POVIDONE-IODINE 5 % OP SOLN
OPHTHALMIC | Status: DC | PRN
  Administered 2017-08-15: 1 via OPHTHALMIC

## 2017-08-15 MED ORDER — TETRACAINE HCL 0.5 % OP SOLN
1.0000 [drp] | OPHTHALMIC | Status: AC
  Administered 2017-08-15 (×3): 1 [drp] via OPHTHALMIC

## 2017-08-15 MED ORDER — PROPOFOL 10 MG/ML IV BOLUS
INTRAVENOUS | Status: AC
  Filled 2017-08-15: qty 20

## 2017-08-15 MED ORDER — SODIUM CHLORIDE 0.9 % IJ SOLN
INTRAMUSCULAR | Status: AC
  Filled 2017-08-15: qty 10

## 2017-08-15 SURGICAL SUPPLY — 13 items
CLOTH BEACON ORANGE TIMEOUT ST (SAFETY) ×2 IMPLANT
EYE SHIELD UNIVERSAL CLEAR (GAUZE/BANDAGES/DRESSINGS) ×2 IMPLANT
GLOVE BIOGEL PI IND STRL 6.5 (GLOVE) IMPLANT
GLOVE BIOGEL PI IND STRL 7.0 (GLOVE) IMPLANT
GLOVE BIOGEL PI INDICATOR 6.5 (GLOVE) ×2
GLOVE BIOGEL PI INDICATOR 7.0 (GLOVE) ×2
PAD ARMBOARD 7.5X6 YLW CONV (MISCELLANEOUS) ×2 IMPLANT
SIGHTPATH CAT PROC W REG LENS (Ophthalmic Related) ×2 IMPLANT
SYR TB 1ML LL NO SAFETY (SYRINGE) ×2 IMPLANT
TAPE SURG TRANSPORE 1 IN (GAUZE/BANDAGES/DRESSINGS) IMPLANT
TAPE SURGICAL TRANSPORE 1 IN (GAUZE/BANDAGES/DRESSINGS) ×2
VISCOELASTIC ADDITIONAL (OPHTHALMIC RELATED) ×2 IMPLANT
WATER STERILE IRR 250ML POUR (IV SOLUTION) ×2 IMPLANT

## 2017-08-15 NOTE — Anesthesia Preprocedure Evaluation (Signed)
Anesthesia Evaluation  Patient identified by MRN, date of birth, ID band Patient awake    Reviewed: Allergy & Precautions, H&P , NPO status , Patient's Chart, lab work & pertinent test results, reviewed documented beta blocker date and time   Airway Mallampati: IV  TM Distance: >3 FB Neck ROM: limited  Mouth opening: Limited Mouth Opening  Dental no notable dental hx. (+) Upper Dentures, Lower Dentures   Pulmonary neg pulmonary ROS, asthma , COPD,  COPD inhaler, Current Smoker,    Pulmonary exam normal breath sounds clear to auscultation       Cardiovascular Exercise Tolerance: Good negative cardio ROS   Rhythm:regular Rate:Normal     Neuro/Psych  Headaches, Anxiety Depression  Neuromuscular disease negative neurological ROS  negative psych ROS   GI/Hepatic negative GI ROS, Neg liver ROS, GERD  ,  Endo/Other  negative endocrine ROS  Renal/GU negative Renal ROS  negative genitourinary   Musculoskeletal   Abdominal   Peds  Hematology negative hematology ROS (+)   Anesthesia Other Findings H/o breast cancer s/p bilateral mastectomy (2008) with subsequent sx and radiotherapy   Reports h/o nausea after anesthesia as anesthesia complication  Reproductive/Obstetrics negative OB ROS                             Anesthesia Physical Anesthesia Plan  ASA: III  Anesthesia Plan: MAC   Post-op Pain Management:    Induction:   PONV Risk Score and Plan:   Airway Management Planned:   Additional Equipment:   Intra-op Plan:   Post-operative Plan:   Informed Consent: I have reviewed the patients History and Physical, chart, labs and discussed the procedure including the risks, benefits and alternatives for the proposed anesthesia with the patient or authorized representative who has indicated his/her understanding and acceptance.   Dental Advisory Given  Plan Discussed with:  CRNA  Anesthesia Plan Comments:         Anesthesia Quick Evaluation

## 2017-08-15 NOTE — Anesthesia Postprocedure Evaluation (Signed)
Anesthesia Post Note  Patient: [name]  Procedure(s) Performed: CATARACT EXTRACTION WITH PHACOEMULSIFICATION  AND INTRAOCULAR LENS PLACEMENT LEFT EYE CDE=2.68 (Left )  Patient location during evaluation: PACU Anesthesia Type: MAC Level of consciousness: awake, awake and alert, oriented and patient cooperative Pain management: pain level controlled Vital Signs Assessment: post-procedure vital signs reviewed and stable Respiratory status: spontaneous breathing, nonlabored ventilation and respiratory function stable Cardiovascular status: stable Postop Assessment: no apparent nausea or vomiting Anesthetic complications: no     Last Vitals:  Vitals:   08/15/17 0715 08/15/17 0720  BP: (!) 112/59 111/60  Pulse:    Resp: (!) 26 18  Temp:    SpO2: 93% 95%    Last Pain:  Vitals:   08/15/17 0651  TempSrc: Oral  PainSc: 0-No pain                 [name]

## 2017-08-15 NOTE — Transfer of Care (Signed)
Immediate Anesthesia Transfer of Care Note  Patient: [name]  Procedure(s) Performed: CATARACT EXTRACTION WITH PHACOEMULSIFICATION  AND INTRAOCULAR LENS PLACEMENT LEFT EYE CDE=2.68 (Left )  Patient Location: PACU  Anesthesia Type:MAC  Level of Consciousness: awake, alert , oriented and patient cooperative  Airway & Oxygen Therapy: Patient Spontanous Breathing  Post-op Assessment: Report given to RN and Post -op Vital signs reviewed and stable  Post vital signs: Reviewed and stable  Last Vitals:  Vitals Value Taken Time  BP    Temp    Pulse    Resp    SpO2      Last Pain:  Vitals:   08/15/17 0651  TempSrc: Oral  PainSc: 0-No pain      Patients Stated Pain Goal: 8 (83/38/25 0539)  Complications: No apparent anesthesia complications

## 2017-08-15 NOTE — Op Note (Signed)
Date of procedure: 08/15/17  Pre-operative diagnosis: Visually significant cataract, Left Eye  Post-operative diagnosis: Visually significant cataract, Left Eye  Procedure: Removal of cataract via phacoemulsification and insertion of intra-ocular lens Johnson and Rutledge  +27.0D into the capsular bag of the Left Eye  Attending surgeon: Gerda Diss. Dorothea Yow, MD, MA  Anesthesia: MAC, Topical [place]  Complications: None  Estimated Blood Loss: <47m (minimal)  Specimens: None  Implants: As above  Indications:  Visually significant cataract, Left Eye  Procedure:  The patient was seen and identified in the pre-operative area. The operative eye was identified and dilated.  The operative eye was marked.  Topical anesthesia was administered to the operative eye.     The patient was then to the operative suite and placed in the supine position.  A timeout was performed confirming the patient, procedure to be performed, and all other relevant information.   The patient's face was prepped and draped in the usual fashion for intra-ocular surgery.  A lid speculum was placed into the operative eye and the surgical microscope moved into place and focused.  An inferotemporal paracentesis was created using a 20 gauge paracentesis blade.  Shugarcaine was injected into the anterior chamber.  Viscoelastic was injected into the anterior chamber.  A temporal clear-corneal main wound incision was created using a 2.42mmicrokeratome.  A continuous curvilinear capsulorrhexis was initiated using an irrigating cystitome and completed using capsulorrhexis forceps.  Hydrodissection and hydrodeliniation were performed.  Viscoelastic was injected into the anterior chamber.  A phacoemulsification handpiece and a chopper as a second instrument were used to remove the nucleus and epinucleus. The irrigation/aspiration handpiece was used to remove any remaining cortical material.   The capsular bag was reinflated with  viscoelastic, checked, and found to be intact.  The intraocular lens was inserted into the capsular bag and dialed into place using a Kuglen hook.  The irrigation/aspiration handpiece was used to remove any remaining viscoelastic.  The clear corneal wound and paracentesis wounds were then hydrated and checked with Weck-Cels to be watertight.  The lid-speculum and drape was removed, and the patient's face was cleaned with a wet and dry 4x4.  Maxitrol was instilled in the eye before a clear shield was taped over the eye. The patient was taken to the post-operative care unit in good condition, having tolerated the procedure well.  Post-Op Instructions: [facility] and will receive all other orders and instructions.

## 2017-08-15 NOTE — Discharge Instructions (Signed)
Please discharge patient when stable, will follow up today with Dr. Marisa Hua at the [facility] office at 8:50AM today.  Leave shield in place until visit.  All paperwork with discharge instructions will be given at the office.   Monitored Anesthesia Care, Care After These instructions provide you with information about caring for yourself after your procedure. Your health care provider may also give you more specific instructions. Your treatment has been planned according to current medical practices, but problems sometimes occur. Call your health care provider if you have any problems or questions after your procedure. What can I expect after the procedure? After your procedure, it is common to:  Feel sleepy for several hours.  Feel clumsy and have poor balance for several hours.  Feel forgetful about what happened after the procedure.  Have poor judgment for several hours.  Feel nauseous or vomit.  Have a sore throat if you had a breathing tube during the procedure.  Follow these instructions at home: For at least 24 hours after the procedure:   Do not: ? Participate in activities in which you could fall or become injured. ? Drive. ? Use heavy machinery. ? Drink alcohol. ? Take sleeping pills or medicines that cause drowsiness. ? Make important decisions or sign legal documents. ? Take care of children on your own.  Rest. Eating and drinking  Follow the diet that is recommended by your health care provider.  If you vomit, drink water, juice, or soup when you can drink without vomiting.  Make sure you have little or no nausea before eating solid foods. General instructions  Have a responsible adult stay with you until you are awake and alert.  Take over-the-counter and prescription medicines only as told by your health care provider.  If you smoke, do not smoke without supervision.  Keep all follow-up visits as told by your health care provider. This is  important. Contact a health care provider if:  You keep feeling nauseous or you keep vomiting.  You feel light-headed.  You develop a rash.  You have a fever. Get help right away if:  You have trouble breathing. This information is not intended to replace advice given to you by your health care provider. Make sure you discuss any questions you have with your health care provider. Document Released: 08/14/2015 Document Revised: 12/14/2015 Document Reviewed: 08/14/2015 Elsevier Interactive Patient Education  [employer].

## 2017-08-15 NOTE — H&P (Signed)
The H and P was reviewed and updated. The patient was examined.  No changes were found after exam.  The surgical eye was marked.  

## 2017-08-16 ENCOUNTER — Encounter (HOSPITAL_COMMUNITY): Payer: Self-pay | Admitting: Ophthalmology

## 2017-08-30 DIAGNOSIS — H2512 Age-related nuclear cataract, left eye: Secondary | ICD-10-CM | POA: Diagnosis not present

## 2017-09-02 ENCOUNTER — Encounter (HOSPITAL_COMMUNITY)
Admission: RE | Admit: 2017-09-02 | Discharge: 2017-09-02 | Disposition: A | Discharge status: Home / Self Care | Payer: Medicare Other | Source: Ambulatory Visit | Attending: Ophthalmology | Admitting: Ophthalmology

## 2017-09-02 ENCOUNTER — Encounter (HOSPITAL_COMMUNITY): Payer: Self-pay

## 2017-09-02 ENCOUNTER — Ambulatory Visit (INDEPENDENT_AMBULATORY_CARE_PROVIDER_SITE_OTHER): Payer: Medicare Other | Admitting: Family Medicine

## 2017-09-02 ENCOUNTER — Encounter: Payer: Self-pay | Admitting: Family Medicine

## 2017-09-02 VITALS — BP 105/64 | HR 82 | Temp 97.6°F | Ht 64.0 in | Wt 187.0 lb

## 2017-09-02 DIAGNOSIS — L309 Dermatitis, unspecified: Secondary | ICD-10-CM

## 2017-09-02 DIAGNOSIS — Z720 Tobacco use: Secondary | ICD-10-CM

## 2017-09-02 DIAGNOSIS — Z7689 Persons encountering health services in other specified circumstances: Secondary | ICD-10-CM | POA: Diagnosis not present

## 2017-09-02 MED ORDER — CRISABOROLE 2 % EX OINT: 1.0000 g | TOPICAL_OINTMENT | Freq: Every day | CUTANEOUS | 1 refills | Status: DC

## 2017-09-02 NOTE — Patient Instructions (Addendum)
I recommend seeing a Dermatologist for a yearly skin check.  Steps to Quit Smoking Smoking tobacco can be bad for your health. It can also affect almost every organ in your body. Smoking puts you and people around you at risk for many serious long-lasting (chronic) diseases. Quitting smoking is hard, but it is one of the best things that you can do for your health. It is never too late to quit. What are the benefits of quitting smoking? When you quit smoking, you lower your risk for getting serious diseases and conditions. They can include:  Lung cancer or lung disease.  Heart disease.  Stroke.  Heart attack.  Not being able to have children (infertility).  Weak bones (osteoporosis) and broken bones (fractures).  If you have coughing, wheezing, and shortness of breath, those symptoms may get better when you quit. You may also get sick less often. If you are pregnant, quitting smoking can help to lower your chances of having a baby of low birth weight. What can I do to help me quit smoking? Talk with your doctor about what can help you quit smoking. Some things you can do (strategies) include:  Quitting smoking totally, instead of slowly cutting back how much you smoke over a period of time.  Going to in-person counseling. You are more likely to quit if you go to many counseling sessions.  Using resources and support systems, such as: ? Database administrator with a Social worker. ? Phone quitlines. ? Careers information officer. ? Support groups or group counseling. ? Text messaging programs. ? Mobile phone apps or applications.  Taking medicines. Some of these medicines may have nicotine in them. If you are pregnant or breastfeeding, do not take any medicines to quit smoking unless your doctor says it is okay. Talk with your doctor about counseling or other things that can help you.  Talk with your doctor about using more than one strategy at the same time, such as taking medicines while you  are also going to in-person counseling. This can help make quitting easier. What things can I do to make it easier to quit? Quitting smoking might feel very hard at first, but there is a lot that you can do to make it easier. Take these steps:  Talk to your family and friends. Ask them to support and encourage you.  Call phone quitlines, reach out to support groups, or work with a Social worker.  Ask people who smoke to not smoke around you.  Avoid places that make you want (trigger) to smoke, such as: ? Bars. ? Parties. ? Smoke-break areas at work.  Spend time with people who do not smoke.  Lower the stress in your life. Stress can make you want to smoke. Try these things to help your stress: ? Getting regular exercise. ? Deep-breathing exercises. ? Yoga. ? Meditating. ? Doing a body scan. To do this, close your eyes, focus on one area of your body at a time from head to toe, and notice which parts of your body are tense. Try to relax the muscles in those areas.  Download or buy apps on your mobile phone or tablet that can help you stick to your quit plan. There are many free apps, such as QuitGuide from the [employer] Office manager for Disease Control and Prevention). You can find more support from smokefree.gov and other websites.  This information is not intended to replace advice given to you by your health care provider. Make sure you discuss any questions  you have with your health care provider. Document Released: 02/17/2009 Document Revised: 12/20/2015 Document Reviewed: 09/07/2014 Elsevier Interactive Patient Education  2018 [employer].

## 2017-09-02 NOTE — Progress Notes (Signed)
Subjective: [name] care HPI: [name] is a 57 y.o. female presenting to clinic today for:  1. Tobacco use disorder Smoked 1 ppd for 20 years.  She is currently smoking about 1/2 ppd due to finances.  Reports occasional cough.  Denies shortness of breath.  She was diagnosed with breast cancer in 2008.  She sees oncology intermittently for this.  2.  Eczema Patient reports that she has had difficulty with eczema, particularly around the face for many years.  She has been applying a topical Lotrisone cream with some relief.  She has never tried [place].  Past Medical History:  Diagnosis Date  . Allergy   . Anxiety   . Blood transfusion without reported diagnosis   . Breast cancer ([place])   . Breast cancer ([place]) 04/21/2012   Stage II (T1c N1 M0) grade 3 triple negative breast cancer, right- sided with 1 of 5 positive nodes, status post FEC in a dose dense fashion for 6 cycles followed by radiation therapy by Dr. Sondra Come for her high-risk disease with her initial date of surgery in February 2008.   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 COPD (chronic obstructive pulmonary disease) ([place])   . Depression   . Emphysema of lung ([place])   . GERD (gastroesophageal reflux disease) 12/18/2012  . Headache   . Hyperlipidemia   . Neuropathy   . Osteoporosis   . PONV (postoperative nausea and vomiting)   . PTSD (post-traumatic stress disorder)   . Substance abuse [facility])    Past Surgical History:  Procedure Laterality Date  . BREAST SURGERY    . CATARACT EXTRACTION W/PHACO Left 08/15/2017   Procedure: CATARACT EXTRACTION WITH PHACOEMULSIFICATION  AND INTRAOCULAR LENS PLACEMENT LEFT EYE CDE=2.68;  Surgeon: Baruch Goldmann, MD;  Location: [facility];  Service: Ophthalmology;  Laterality: Left;  left  . ESOPHAGOGASTRODUODENOSCOPY  03/10/2008   SLF: Normal esophagus without evidence of Barrett, mass, erosion  ulceration, or stricture  . leocolonoscopy  03/10/2008   WER:[id number]  terminal ileum, approximately 10 cm visualized/Normal colon without evidence of polyps, mass, inflammatory changes, diverticula, or AVMs/Normal retroflexed view of the rectum/Patchy erythema in the antrum without erosion or ulceration  . MASTECTOMY     bilateral  . TUBAL LIGATION     Social History   Socioeconomic History  . Marital status: Divorced    Spouse name: Not on file  . Number of children: 2  . Years of education: 29  . Highest education level: Not on file  Occupational History  . Occupation: disabled    Comment: neuropathy from [place]  . Financial resource strain: Not on file  . Food insecurity:    Worry: Not on file    Inability: Not on file  . Transportation needs:    Medical: Not on file    Non-medical: Not on file  Tobacco Use  . Smoking status: Current Every Day Smoker    Packs/day: 0.50    Years: 20.00    Pack years: 10.00    Types: Cigarettes    Start date: 05/07/1992  . Smokeless tobacco: Never Used  . Tobacco comment: discussed  Substance and Sexual Activity  . Alcohol use: Yes    Alcohol/week: 3.6 oz    Types: 6 Shots of liquor per week    Comment: occ  . Drug use: No  . Sexual activity: Yes    Birth control/protection: Post-menopausal  Lifestyle  . Physical activity:    Days per week: Not on file  Minutes per session: Not on file  . Stress: Not on file  Relationships  . Social connections:    Talks on phone: Not on file    Gets together: Not on file    Attends religious service: Not on file    Active member of club or organization: Not on file    Attends meetings of clubs or organizations: Not on file    Relationship status: Not on file  . Intimate partner violence:    Fear of current or ex partner: Not on file    Emotionally abused: Not on file    Physically abused: Not on file    Forced sexual activity: Not on file  Other Topics Concern  . Not on file  Social History Narrative   Divorced   Lives alone   Spends time  with boyfriend - he is a smoker   Current Meds  Medication Sig  . albuterol (PROVENTIL HFA;VENTOLIN HFA) 108 (90 Base) MCG/ACT inhaler Inhale 2 puffs into the lungs every 6 (six) hours as needed for wheezing or shortness of breath.  . cholecalciferol (VITAMIN D) 1000 units tablet Take 1,000 Units by mouth daily.  . clotrimazole-betamethasone (LOTRISONE) cream Apply 1 application topically 2 (two) times daily. To corner of mouth  . diazepam (VALIUM) 10 MG tablet Take 1 tablet (10 mg total) by mouth 3 (three) times daily.  . DULoxetine (CYMBALTA) 60 MG capsule Take 1 capsule (60 mg total) by mouth 2 (two) times daily.  . fluticasone-salmeterol (ADVAIR HFA) 115-21 MCG/ACT inhaler Inhale 2 puffs into the lungs 2 (two) times daily. (Patient taking differently: Inhale 2 puffs into the lungs 4 (four) times a week. )  . omeprazole (PRILOSEC) 20 MG capsule Take 1 capsule (20 mg total) by mouth daily.  . pravastatin (PRAVACHOL) 20 MG tablet Take 1 tablet (20 mg total) by mouth at bedtime.  . traZODone (DESYREL) 100 MG tablet Take 3 tablets (300 mg total) by mouth at bedtime.  . vitamin C (ASCORBIC ACID) 500 MG tablet Take 500 mg by mouth daily.   Family History  Problem Relation Age of Onset  . COPD Mother   . Depression Mother   . Heart disease Mother   . Hyperlipidemia Mother   . Hypertension Mother   . Cancer Mother        lung cancer, to bone  . Cancer Father        skin  . Heart disease Father   . Hypertension Father   . Hyperlipidemia Father   . Learning disabilities Father   . Drug abuse Brother        overdose at 39  . Drug abuse Daughter        drug overdose 60  . Cancer Maternal Uncle        lung cancer   Allergies  Allergen Reactions  . Vicodin [Hydrocodone-Acetaminophen] Nausea Only     Health Maintenance: Tetanus shot due but patient unable to afford today.  She plans for colonoscopy tomorrow. ROS: Per HPI  Objective: Office vital signs reviewed. BP 105/64   Pulse  82   Temp 97.6 F (36.4 C) (Oral)   Ht 5\' 4"  (1.626 m)   Wt 187 lb (84.8 kg)   LMP 07/09/2013   BMI 32.10 kg/m   Physical Examination:  General: Awake, alert, chronically ill appearing female, No acute distress HEENT: sclera white, MMM Cardio: regular rate and rhythm, S1S2 heard, no murmurs appreciated Pulm: clear to auscultation bilaterally, no wheezes, rhonchi or  rales; normal work of breathing on room air Extremities: warm, well perfused, No edema, cyanosis or clubbing; +2 pulses bilaterally MSK: normal gait and normal station Skin: dry; areas of diffuse hyperpigmentation appreciated within the face.  She has flaking of the skin. Psych: Mood intermittently depressed when talking about the loss of her daughter and mother, speech normal, affect appropriate  Depression screen [facility] 2/9 09/02/2017 11/23/2016 06/21/2016  Decreased Interest 2 0 2  Down, Depressed, Hopeless 2 0 2  PHQ - 2 Score 4 0 4  Altered sleeping 1 - 2  Tired, decreased energy 2 - 2  Change in appetite 0 - 2  Feeling bad or failure about yourself  - - 2  Trouble concentrating 1 - 2  Moving slowly or fidgety/restless 0 - 0  Suicidal thoughts 1 - 0  PHQ-9 Score 9 - 14     Assessment/ Plan: 57 y.o. female   1. Establishing care with new doctor, encounter for  2. Tobacco abuse Counseling performed.  Patient is contemplative.  Handout provided.  Will continue to readdress with each visit  3. Eczema, unspecified type We discussed that use of topical corticosteroids to the face is not recommended as it does discolor and thin the skin of the face.  I have prescribed her Georga Hacking and provided her a small sample, as I anticipate will probably need to do a prior authorization for the medication.  Follow-up as needed.   [name], DO [place] [phone]

## 2017-09-03 ENCOUNTER — Telehealth: Payer: Self-pay

## 2017-09-03 ENCOUNTER — Other Ambulatory Visit: Payer: Self-pay

## 2017-09-03 ENCOUNTER — Encounter: Payer: Self-pay | Admitting: Gastroenterology

## 2017-09-03 ENCOUNTER — Ambulatory Visit (INDEPENDENT_AMBULATORY_CARE_PROVIDER_SITE_OTHER): Payer: Medicare Other | Admitting: Gastroenterology

## 2017-09-03 VITALS — BP 128/80 | HR 85 | Temp 97.0°F | Ht 64.0 in | Wt 190.4 lb

## 2017-09-03 DIAGNOSIS — R197 Diarrhea, unspecified: Secondary | ICD-10-CM

## 2017-09-03 DIAGNOSIS — K921 Melena: Secondary | ICD-10-CM | POA: Insufficient documentation

## 2017-09-03 DIAGNOSIS — K219 Gastro-esophageal reflux disease without esophagitis: Secondary | ICD-10-CM

## 2017-09-03 DIAGNOSIS — K625 Hemorrhage of anus and rectum: Secondary | ICD-10-CM | POA: Diagnosis not present

## 2017-09-03 MED ORDER — CLENPIQ 10-3.5-12 MG-GM -GM/160ML PO SOLN: 1.0000 | Freq: Once | ORAL | 0 refills | Status: AC

## 2017-09-03 MED ORDER — PANTOPRAZOLE SODIUM 40 MG PO TBEC: 40.0000 mg | DELAYED_RELEASE_TABLET | Freq: Every day | ORAL | 11 refills | Status: DC

## 2017-09-03 NOTE — Assessment & Plan Note (Signed)
Refractory symptoms on omeprazole 20 mg daily.  Status post remote radiation in 2008 for breast cancer.  Denies dysphagia.  Will switch to pantoprazole 40 mg, 30 minutes before breakfast daily.  She may have complicated GERD from previous radiation, with reported melena previously cannot rule out gastritis/PUD.  Offered upper endoscopy at time of colonoscopy. Plan for deep sedation given polypharmacy.  I have discussed the risks, alternatives, benefits with regards to but not limited to the risk of reaction to medication, bleeding, infection, perforation and the patient is agreeable to proceed. Written consent to be obtained.

## 2017-09-03 NOTE — Telephone Encounter (Signed)
Eucrisa Ointment denied by insurance  Preferred that she needs to try one of is: Ala Cort hydrocortisone cream/ointment, Alclometasone, clobetasol cream/gel/ointment, Cordan, Desonide ointment, desoximetasone, Elidel Fluocinolone body oil, fluocinonide gel/ointment, fluticasone, Halobetasol, mometasone prednicarbate tacrolimus ointment and Triderm cream/ointment

## 2017-09-03 NOTE — Telephone Encounter (Signed)
Pt called office, informed of pre-op appt. 

## 2017-09-03 NOTE — Telephone Encounter (Signed)
Tried to call pt to inform of pre-op appt 10/29/17 at 11:00am. Letter mailed.

## 2017-09-03 NOTE — Telephone Encounter (Signed)
I will do an appeal

## 2017-09-03 NOTE — Assessment & Plan Note (Signed)
Recurrent diarrhea, persisting for several months, associated with bright red blood per rectum and lower abdominal pain.  Ileocolonoscopy unremarkable in 2008 including random colon biopsies.  Plan for colonoscopy in the near future with deep sedation given polypharmacy.  I have discussed the risks, alternatives, benefits with regards to but not limited to the risk of reaction to medication, bleeding, infection, perforation and the patient is agreeable to proceed. Written consent to be obtained.

## 2017-09-03 NOTE — Progress Notes (Signed)
cc'ed to pcp °

## 2017-09-03 NOTE — Patient Instructions (Signed)
1. Stop omeprazole. Start pantoprazole once daily before breakfast. Let me know if more expensive then the omeprazole.  2. Upper endoscopy and colonoscopy as scheduled. See separate instructions.

## 2017-09-03 NOTE — Progress Notes (Addendum)
Please let patient know I would like to check stool studies while we wait on TCS/EGD.   Let's send stool for GI path panel and Cdiff GDH. Orders placed.   Also let her know we can try bentyl for diarrhea is she would like.

## 2017-09-03 NOTE — Telephone Encounter (Signed)
She has been using betamethasone cream and failed this.  She is unable to use moderate/ high potency corticosteroids on the face.  Can we put this information in the PA comment field and resubmit the claim?

## 2017-09-03 NOTE — Addendum Note (Signed)
Addended by: Mahala Menghini on: 09/03/2017 10:04 AM   Modules accepted: Orders

## 2017-09-03 NOTE — Progress Notes (Addendum)
Primary Care Physician:  [name], DO  Primary Gastroenterologist:  Barney Drain, MD  REVIEWED.weight up 10 lbs since 2018.  Chief Complaint  Patient presents with  . Diarrhea    4-5 times a day  . Blood In Stools    last episode was 2 days ago, bright red    HPI:  [name] is a 57 y.o. female here at the request of Dr. Meda Coffee for further evaluation of change in bowel habits, blood in the stool.  Since referral was made, patient has had to establish care with a new PCP as Dr. Meda Coffee left the area.  She now sees Adam Phenix, DO.   Patient states back in March she had several episodes of black bloody stools for which she saw Dr. Meda Coffee.  Hemoglobin was normal at 14.  Stools basically have been brown lately however she continues to have predominantly loose stools, sometimes incontinence, nocturnal diarrhea.  About 5 BMs daily.  Occasional formed bowel movement.  Fresh blood in the stool noted frequently, last time a couple of days ago.  She complains of constant lower abdominal pressure, feeling the urge to have a bowel movement at all times.  She also notes that her reflux is terrible.  She has been on omeprazole for over 10 years.  She is waking up in the middle the night with regurgitation.  Really no dysphagia.  Has heartburn every single day.  He is afraid to eat as it causes more diarrhea.  Her appetite is poor.  Denies weight loss.  She has a history of breast cancer, underwent radiation, chemo, surgery back in 2008.  She is underwent reconstructive surgery after bilateral mastectomy.   Current Outpatient Medications  Medication Sig Dispense Refill  . albuterol (PROVENTIL HFA;VENTOLIN HFA) 108 (90 Base) MCG/ACT inhaler Inhale 2 puffs into the lungs every 6 (six) hours as needed for wheezing or shortness of breath. 1 Inhaler 5  . cholecalciferol (VITAMIN D) 1000 units tablet Take 1,000 Units by mouth daily.    . clotrimazole-betamethasone (LOTRISONE) cream Apply 1  application topically 2 (two) times daily. To corner of mouth 30 g 0  . Crisaborole (EUCRISA) 2 % OINT Apply 1 g topically daily. Apply to affected areas on face daily for eczema. 60 g 1  . diazepam (VALIUM) 10 MG tablet Take 1 tablet (10 mg total) by mouth 3 (three) times daily. 90 tablet 2  . DULoxetine (CYMBALTA) 60 MG capsule Take 1 capsule (60 mg total) by mouth 2 (two) times daily. 180 capsule 2  . fluticasone-salmeterol (ADVAIR HFA) 115-21 MCG/ACT inhaler Inhale 2 puffs into the lungs 2 (two) times daily. (Patient taking differently: Inhale 2 puffs into the lungs 4 (four) times a week. ) 1 Inhaler 5  . Loperamide HCl (ANTI-DIARRHEAL PO) Take by mouth as needed.    [name] omeprazole (PRILOSEC) 20 MG capsule Take 1 capsule (20 mg total) by mouth daily. 90 capsule 3  . pravastatin (PRAVACHOL) 20 MG tablet Take 1 tablet (20 mg total) by mouth at bedtime. 90 tablet 0  . traZODone (DESYREL) 100 MG tablet Take 3 tablets (300 mg total) by mouth at bedtime. 90 tablet 2  . triamcinolone cream (KENALOG) 0.1 % Apply 1 application topically 2 (two) times daily. Until rash clears 30 g 1  . vitamin C (ASCORBIC ACID) 500 MG tablet Take 500 mg by mouth daily.     No current facility-administered medications for this visit.     Allergies as of 09/03/2017 -  Review Complete 09/03/2017  Allergen Reaction Noted  . Vicodin [hydrocodone-acetaminophen] Nausea Only 07/15/2013    Past Medical History:  Diagnosis Date  . Allergy   . Anxiety   . Blood transfusion without reported diagnosis   . Breast cancer ([place])   . Breast cancer ([place]) 04/21/2012   Stage II (T1c N1 M0) grade 3 triple negative breast cancer, right- sided with 1 of 5 positive nodes, status post FEC in a dose dense fashion for 6 cycles followed by radiation therapy by Dr. Sondra Come for her high-risk disease with her initial date of surgery in February 2008.   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 COPD (chronic obstructive pulmonary  disease) ([place])   . Depression   . Emphysema of lung ([place])   . GERD (gastroesophageal reflux disease) 12/18/2012  . Headache   . Hyperlipidemia   . Neuropathy   . Osteoporosis   . PONV (postoperative nausea and vomiting)   . PTSD (post-traumatic stress disorder)   . Substance abuse [facility])     Past Surgical History:  Procedure Laterality Date  . BREAST SURGERY    . CATARACT EXTRACTION W/PHACO Left 08/15/2017   Procedure: CATARACT EXTRACTION WITH PHACOEMULSIFICATION  AND INTRAOCULAR LENS PLACEMENT LEFT EYE CDE=2.68;  Surgeon: Baruch Goldmann, MD;  Location: [facility];  Service: Ophthalmology;  Laterality: Left;  left  . ESOPHAGOGASTRODUODENOSCOPY  03/10/2008   SLF: Normal esophagus without evidence of Barrett, mass, erosion  ulceration, or stricture, small bowel bx negative, gastritis with NO h.pylori  . leocolonoscopy  03/10/2008   NTI:RWERXV terminal ileum, approximately 10 cm visualized/Normal colon without evidence of polyps, mass, inflammatory changes, diverticula, or AVMs/Normal retroflexed view of the rectum. random colon bx negative.  [name] MASTECTOMY     bilateral  . TUBAL LIGATION      Family History  Problem Relation Age of Onset  . COPD Mother   . Depression Mother   . Heart disease Mother   . Hyperlipidemia Mother   . Hypertension Mother   . Cancer Mother        lung cancer, to bone  . Cancer Father        skin  . Heart disease Father   . Hypertension Father   . Hyperlipidemia Father   . Learning disabilities Father   . Drug abuse Brother        overdose at 4  . Drug abuse Daughter        drug overdose 62  . Cancer Maternal Uncle        lung cancer  . Colon cancer Neg Hx     Social History   Socioeconomic History  . Marital status: Divorced    Spouse name: Not on file  . Number of children: 2  . Years of education: 16  . Highest education level: Not on file  Occupational History  . Occupation: disabled    Comment: neuropathy from [place]  .  Financial resource strain: Not on file  . Food insecurity:    Worry: Not on file    Inability: Not on file  . Transportation needs:    Medical: Not on file    Non-medical: Not on file  Tobacco Use  . Smoking status: Current Every Day Smoker    Packs/day: 0.50    Years: 20.00    Pack years: 10.00    Types: Cigarettes    Start date: 05/07/1992  . Smokeless tobacco: Never Used  . Tobacco comment: discussed  Substance and Sexual  Activity  . Alcohol use: Yes    Alcohol/week: 3.6 oz    Types: 6 Shots of liquor per week    Comment: occ  . Drug use: No  . Sexual activity: Yes    Birth control/protection: Post-menopausal  Lifestyle  . Physical activity:    Days per week: Not on file    Minutes per session: Not on file  . Stress: Not on file  Relationships  . Social connections:    Talks on phone: Not on file    Gets together: Not on file    Attends religious service: Not on file    Active member of club or organization: Not on file    Attends meetings of clubs or organizations: Not on file    Relationship status: Not on file  . Intimate partner violence:    Fear of current or ex partner: Not on file    Emotionally abused: Not on file    Physically abused: Not on file    Forced sexual activity: Not on file  Other Topics Concern  . Not on file  Social History Narrative   Divorced   Lives alone   Spends time with boyfriend - he is a smoker      ROS:  General: Negative for anorexia, weight loss, fever, chills, fatigue, weakness. Eyes: Negative for vision changes.  ENT: Negative for hoarseness, difficulty swallowing , nasal congestion.  Coughs up phlegm every day. CV: Negative for chest pain, angina, palpitations, dyspnea on exertion, peripheral edema.  Respiratory: Negative for dyspnea at rest, dyspnea on exertion, cough, sputum, wheezing.  GI: See history of present illness. GU:  Negative for dysuria, hematuria, urinary incontinence, urinary frequency, nocturnal  urination.  MS: Negative for joint pain, low back pain.  Derm: Negative for rash or itching.  Neuro: Negative for weakness, abnormal sensation, seizure, frequent headaches, memory loss, confusion.  Psych: Negative suicidal ideation, hallucinations.  Positive for anxiety, depression  Endo: Negative for unusual weight change.  Heme: Negative for bruising or bleeding. Allergy: Negative for rash or hives.    Physical Examination:  BP 128/80   Pulse 85   Temp (!) 97 F (36.1 C) (Oral)   Ht 5\' 4"  (1.626 m)   Wt 190 lb 6.4 oz (86.4 kg)   LMP 07/09/2013   BMI 32.68 kg/m    General: Well-nourished, well-developed in no acute distress.  Head: Normocephalic, atraumatic.   Eyes: Conjunctiva pink, no icterus. Mouth: Oropharyngeal mucosa moist and pink , no lesions erythema or exudate. Neck: Supple without thyromegaly, masses, or lymphadenopathy.  Lungs: Clear to auscultation bilaterally.  Heart: Regular rate and rhythm, no murmurs rubs or gallops.  Abdomen: Bowel sounds are normal, mild lower abdominal tenderness, nondistended, no hepatosplenomegaly or masses, no abdominal bruits or    hernia , no rebound or guarding.   Rectal: Deferred Extremities: No lower extremity edema. No clubbing or deformities.  Neuro: Alert and oriented x 4 , grossly normal neurologically.  Skin: Warm and dry, no rash or jaundice.   Psych: Alert and cooperative, normal mood and affect.  Labs: Lab Results  Component Value Date   WBC 10.2 07/08/2017   HGB 14.1 07/08/2017   HCT 40.4 07/08/2017   MCV 90.0 07/08/2017   PLT 312 07/08/2017   Lab Results  Component Value Date   CREATININE 0.64 07/08/2017   BUN 10 07/08/2017   NA 139 07/08/2017   K 4.4 07/08/2017   CL 104 07/08/2017   CO2 29 07/08/2017  Lab Results  Component Value Date   ALT 17 07/08/2017   AST 17 07/08/2017   ALKPHOS 76 11/23/2016   BILITOT 0.4 07/08/2017     Imaging Studies: No results found.

## 2017-09-03 NOTE — Progress Notes (Signed)
LMOM to call.

## 2017-09-04 NOTE — Progress Notes (Signed)
LMOM to call and mailing a letter to call also.

## 2017-09-06 ENCOUNTER — Ambulatory Visit (HOSPITAL_COMMUNITY)
Admission: RE | Admit: 2017-09-06 | Discharge: 2017-09-06 | Disposition: A | Discharge status: Home / Self Care | Payer: Medicare Other | Source: Ambulatory Visit | Attending: Ophthalmology | Admitting: Ophthalmology

## 2017-09-06 ENCOUNTER — Ambulatory Visit (HOSPITAL_COMMUNITY): Payer: Medicare Other | Admitting: Anesthesiology

## 2017-09-06 ENCOUNTER — Encounter (HOSPITAL_COMMUNITY): Payer: Self-pay | Admitting: Anesthesiology

## 2017-09-06 ENCOUNTER — Encounter (HOSPITAL_COMMUNITY)
Admission: RE | Disposition: A | Discharge status: Home / Self Care | Payer: Self-pay | Source: Ambulatory Visit | Attending: Ophthalmology

## 2017-09-06 DIAGNOSIS — H2511 Age-related nuclear cataract, right eye: Secondary | ICD-10-CM | POA: Insufficient documentation

## 2017-09-06 DIAGNOSIS — J45909 Unspecified asthma, uncomplicated: Secondary | ICD-10-CM | POA: Diagnosis not present

## 2017-09-06 DIAGNOSIS — E7800 Pure hypercholesterolemia, unspecified: Secondary | ICD-10-CM | POA: Diagnosis not present

## 2017-09-06 DIAGNOSIS — F329 Major depressive disorder, single episode, unspecified: Secondary | ICD-10-CM | POA: Diagnosis not present

## 2017-09-06 DIAGNOSIS — Z79899 Other long term (current) drug therapy: Secondary | ICD-10-CM | POA: Diagnosis not present

## 2017-09-06 DIAGNOSIS — F17200 Nicotine dependence, unspecified, uncomplicated: Secondary | ICD-10-CM | POA: Diagnosis not present

## 2017-09-06 HISTORY — PX: CATARACT EXTRACTION W/PHACO: SHX586

## 2017-09-06 SURGERY — PHACOEMULSIFICATION, CATARACT, WITH IOL INSERTION
Anesthesia: General | Site: Eye | Laterality: Right

## 2017-09-06 MED ORDER — PHENYLEPHRINE HCL 2.5 % OP SOLN
1.0000 [drp] | OPHTHALMIC | Status: AC
  Administered 2017-09-06 (×3): 1 [drp] via OPHTHALMIC

## 2017-09-06 MED ORDER — LIDOCAINE HCL 3.5 % OP GEL
1.0000 "application " | Freq: Once | OPHTHALMIC | Status: AC
  Administered 2017-09-06: 1 via OPHTHALMIC

## 2017-09-06 MED ORDER — BSS IO SOLN
INTRAOCULAR | Status: DC | PRN
  Administered 2017-09-06: 15 mL

## 2017-09-06 MED ORDER — CYCLOPENTOLATE-PHENYLEPHRINE 0.2-1 % OP SOLN
1.0000 [drp] | OPHTHALMIC | Status: AC
  Administered 2017-09-06 (×3): 1 [drp] via OPHTHALMIC

## 2017-09-06 MED ORDER — TETRACAINE HCL 0.5 % OP SOLN
1.0000 [drp] | OPHTHALMIC | Status: AC
  Administered 2017-09-06 (×3): 1 [drp] via OPHTHALMIC

## 2017-09-06 MED ORDER — POVIDONE-IODINE 5 % OP SOLN
OPHTHALMIC | Status: DC | PRN
  Administered 2017-09-06: 1 via OPHTHALMIC

## 2017-09-06 MED ORDER — NEOMYCIN-POLYMYXIN-DEXAMETH 3.5-10000-0.1 OP SUSP
OPHTHALMIC | Status: DC | PRN
  Administered 2017-09-06: 2 [drp] via OPHTHALMIC

## 2017-09-06 MED ORDER — EPINEPHRINE PF 1 MG/ML IJ SOLN
INTRAOCULAR | Status: DC | PRN
  Administered 2017-09-06: 500 mL

## 2017-09-06 MED ORDER — LIDOCAINE HCL (PF) 1 % IJ SOLN
INTRAMUSCULAR | Status: DC | PRN
  Administered 2017-09-06: 1 mL via OPHTHALMIC

## 2017-09-06 MED ORDER — MIDAZOLAM HCL 5 MG/5ML IJ SOLN
INTRAMUSCULAR | Status: DC | PRN
  Administered 2017-09-06 (×2): 1 mg via INTRAVENOUS

## 2017-09-06 MED ORDER — MIDAZOLAM HCL 2 MG/2ML IJ SOLN
INTRAMUSCULAR | Status: AC
  Filled 2017-09-06: qty 2

## 2017-09-06 MED ORDER — SODIUM HYALURONATE 23 MG/ML IO SOLN
INTRAOCULAR | Status: DC | PRN
  Administered 2017-09-06: 0.6 mL via INTRAOCULAR

## 2017-09-06 MED ORDER — PROVISC 10 MG/ML IO SOLN
INTRAOCULAR | Status: DC | PRN
  Administered 2017-09-06: 0.85 mL via INTRAOCULAR

## 2017-09-06 MED ORDER — LACTATED RINGERS IV SOLN
INTRAVENOUS | Status: DC
  Administered 2017-09-06: 08:00:00 via INTRAVENOUS

## 2017-09-06 SURGICAL SUPPLY — 16 items
CLOTH BEACON ORANGE TIMEOUT ST (SAFETY) ×2 IMPLANT
EYE SHIELD UNIVERSAL CLEAR (GAUZE/BANDAGES/DRESSINGS) ×2 IMPLANT
GLOVE BIOGEL PI IND STRL 6.5 (GLOVE) IMPLANT
GLOVE BIOGEL PI INDICATOR 6.5 (GLOVE) ×2
GLOVE SS BIOGEL STRL SZ 6.5 (GLOVE) IMPLANT
GLOVE SUPERSENSE BIOGEL SZ 6.5 (GLOVE) ×2
LENS ALC ACRYL/TECN (Ophthalmic Related) ×2 IMPLANT
NDL HYPO 18GX1.5 BLUNT FILL (NEEDLE) IMPLANT
NEEDLE HYPO 18GX1.5 BLUNT FILL (NEEDLE) ×3 IMPLANT
PAD ARMBOARD 7.5X6 YLW CONV (MISCELLANEOUS) ×2 IMPLANT
SYR TB 1ML LL NO SAFETY (SYRINGE) ×2 IMPLANT
TAPE PAPER 2X10 WHT MICROPORE (GAUZE/BANDAGES/DRESSINGS) ×2 IMPLANT
TAPE SURG TRANSPORE 1 IN (GAUZE/BANDAGES/DRESSINGS) IMPLANT
TAPE SURGICAL TRANSPORE 1 IN (GAUZE/BANDAGES/DRESSINGS) ×2
VISCOELASTIC ADDITIONAL (OPHTHALMIC RELATED) ×2 IMPLANT
WATER STERILE IRR 250ML POUR (IV SOLUTION) ×2 IMPLANT

## 2017-09-06 NOTE — Transfer of Care (Signed)
Immediate Anesthesia Transfer of Care Note  Patient: [name]  Procedure(s) Performed: CATARACT EXTRACTION PHACO AND INTRAOCULAR LENS PLACEMENT (IOC) (Right Eye)  Patient Location: Short Stay  Anesthesia Type:MAC  Level of Consciousness: awake  Airway & Oxygen Therapy: Patient Spontanous Breathing  Post-op Assessment: Report given to RN  Post vital signs: Reviewed  Last Vitals:  Vitals Value Taken Time  BP    Temp    Pulse    Resp    SpO2      Last Pain:  Vitals:   09/06/17 0756  TempSrc: Oral  PainSc: 0-No pain         Complications: No apparent anesthesia complications

## 2017-09-06 NOTE — H&P (Signed)
The H and P was reviewed and updated. The patient was examined.  No changes were found after exam.  The surgical eye was marked.  

## 2017-09-06 NOTE — Discharge Instructions (Signed)
Please discharge patient when stable, will follow up today with Dr. Felicia Bloomquist at the [facility] office immediately following discharge.  Leave shield in place until visit.  All paperwork with discharge instructions will be given at the office. ° ° °Monitored Anesthesia Care, Care After °These instructions provide you with information about caring for yourself after your procedure. Your health care provider may also give you more specific instructions. Your treatment has been planned according to current medical practices, but problems sometimes occur. Call your health care provider if you have any problems or questions after your procedure. °What can I expect after the procedure? °After your procedure, it is common to: °· Feel sleepy for several hours. °· Feel clumsy and have poor balance for several hours. °· Feel forgetful about what happened after the procedure. °· Have poor judgment for several hours. °· Feel nauseous or vomit. °· Have a sore throat if you had a breathing tube during the procedure. ° °Follow these instructions at home: °For at least 24 hours after the procedure: ° °· Do not: °? Participate in activities in which you could fall or become injured. °? Drive. °? Use heavy machinery. °? Drink alcohol. °? Take sleeping pills or medicines that cause drowsiness. °? Make important decisions or sign legal documents. °? Take care of children on your own. °· Rest. °Eating and drinking °· Follow the diet that is recommended by your health care provider. °· If you vomit, drink water, juice, or soup when you can drink without vomiting. °· Make sure you have little or no nausea before eating solid foods. °General instructions °· Have a responsible adult stay with you until you are awake and alert. °· Take over-the-counter and prescription medicines only as told by your health care provider. °· If you smoke, do not smoke without supervision. °· Keep all follow-up visits as told by your health care  provider. This is important. °Contact a health care provider if: °· You keep feeling nauseous or you keep vomiting. °· You feel light-headed. °· You develop a rash. °· You have a fever. °Get help right away if: °· You have trouble breathing. °This information is not intended to replace advice given to you by your health care provider. Make sure you discuss any questions you have with your health care provider. °Document Released: 08/14/2015 Document Revised: 12/14/2015 Document Reviewed: 08/14/2015 °Elsevier Interactive Patient Education © 2018 [employer] ° °

## 2017-09-06 NOTE — Anesthesia Preprocedure Evaluation (Addendum)
Anesthesia Evaluation  Patient identified by MRN, date of birth, ID band Patient awake    Reviewed: Allergy & Precautions, H&P , NPO status , Patient's Chart, lab work & pertinent test results, reviewed documented beta blocker date and time   Airway Mallampati: III  TM Distance: >3 FB Neck ROM: full    Dental no notable dental hx. (+) Upper Dentures, Lower Dentures   Pulmonary Current Smoker,    Pulmonary exam normal breath sounds clear to auscultation       Cardiovascular Exercise Tolerance: Good negative cardio ROS   Rhythm:regular Rate:Normal     Neuro/Psych    GI/Hepatic Neg liver ROS,   Endo/Other  negative endocrine ROS  Renal/GU negative Renal ROS  negative genitourinary   Musculoskeletal   Abdominal   Peds  Hematology negative hematology ROS (+)   Anesthesia Other Findings No clinical complaints; no recent changes in health history H/O  Breast cancer  Reproductive/Obstetrics negative OB ROS                             Anesthesia Physical Anesthesia Plan  ASA: III  Anesthesia Plan: MAC   Post-op Pain Management:    Induction:   PONV Risk Score and Plan:   Airway Management Planned:   Additional Equipment:   Intra-op Plan:   Post-operative Plan:   Informed Consent: I have reviewed the patients History and Physical, chart, labs and discussed the procedure including the risks, benefits and alternatives for the proposed anesthesia with the patient or authorized representative who has indicated his/her understanding and acceptance.   Dental Advisory Given  Plan Discussed with: CRNA  Anesthesia Plan Comments:        Anesthesia Quick Evaluation

## 2017-09-06 NOTE — Anesthesia Postprocedure Evaluation (Signed)
Anesthesia Post Note  Patient: [name]  Procedure(s) Performed: CATARACT EXTRACTION PHACO AND INTRAOCULAR LENS PLACEMENT (IOC) (Right Eye)  Patient location during evaluation: Short Stay Anesthesia Type: General Level of consciousness: awake and alert and oriented Pain management: pain level controlled Vital Signs Assessment: post-procedure vital signs reviewed and stable Respiratory status: spontaneous breathing Cardiovascular status: stable Postop Assessment: no apparent nausea or vomiting and adequate PO intake Anesthetic complications: no     Last Vitals:  Vitals:   09/06/17 0805 09/06/17 0810  BP: (!) 87/57   Pulse:    Resp: 20 15  Temp:    SpO2: 90% 93%    Last Pain:  Vitals:   09/06/17 0756  TempSrc: Oral  PainSc: 0-No pain                 Drezden Seitzinger

## 2017-09-06 NOTE — Op Note (Signed)
Date of procedure: 09/06/17  Pre-operative diagnosis: Visually significant cataract, Right Eye (H25.11)  Post-operative diagnosis: Visually significant cataract, Right Eye  Procedure: Removal of cataract via phacoemulsification and insertion of intra-ocular lens [employer] PCB00  +25.0D into the capsular bag of the Right Eye  Attending surgeon: Gerda Diss. Arren Laminack, MD, MA  Anesthesia: MAC, Topical [place]  Complications: None  Estimated Blood Loss: <63m (minimal)  Specimens: None  Implants: As above  Indications:  Visually significant cataract, Right Eye  Procedure:  The patient was seen and identified in the pre-operative area. The operative eye was identified and dilated.  The operative eye was marked.  Topical anesthesia was administered to the operative eye.     The patient was then to the operative suite and placed in the supine position.  A timeout was performed confirming the patient, procedure to be performed, and all other relevant information.   The patient's face was prepped and draped in the usual fashion for intra-ocular surgery.  A lid speculum was placed into the operative eye and the surgical microscope moved into place and focused.  A superotemporal paracentesis was created using a 20 gauge paracentesis blade.  Shugarcaine was injected into the anterior chamber.  Viscoelastic was injected into the anterior chamber.  A temporal clear-corneal main wound incision was created using a 2.429mmicrokeratome.  A continuous curvilinear capsulorrhexis was initiated using an irrigating cystitome and completed using capsulorrhexis forceps.  Hydrodissection and hydrodeliniation were performed.  Viscoelastic was injected into the anterior chamber.  A phacoemulsification handpiece and a chopper as a second instrument were used to remove the nucleus and epinucleus. The irrigation/aspiration handpiece was used to remove any remaining cortical material.   The capsular bag was  reinflated with viscoelastic, checked, and found to be intact.  The intraocular lens was inserted into the capsular bag and dialed into place using a Kuglen hook.  The irrigation/aspiration handpiece was used to remove any remaining viscoelastic.  The clear corneal wound and paracentesis wounds were then hydrated and checked with Weck-Cels to be watertight.  The lid-speculum and drape was removed, and the patient's face was cleaned with a wet and dry 4x4.  Maxitrol was instilled in the eye before a clear shield was taped over the eye. The patient was taken to the post-operative care unit in good condition, having tolerated the procedure well.  Post-Op Instructions: The [facility] post[facility]operative evaluation and will receive all other orders and instructions.

## 2017-09-09 ENCOUNTER — Encounter (HOSPITAL_COMMUNITY): Payer: Self-pay | Admitting: Ophthalmology

## 2017-09-12 ENCOUNTER — Telehealth: Payer: Self-pay

## 2017-09-12 NOTE — Telephone Encounter (Signed)
PT received letter and returned call. She is aware to do the stool studies while waiting to do the TCS/EGD.

## 2017-09-12 NOTE — Telephone Encounter (Signed)
Noted  

## 2017-09-16 ENCOUNTER — Telehealth: Payer: Self-pay | Admitting: Family Medicine

## 2017-09-16 ENCOUNTER — Telehealth: Payer: Self-pay

## 2017-09-16 DIAGNOSIS — R197 Diarrhea, unspecified: Secondary | ICD-10-CM

## 2017-09-16 NOTE — Telephone Encounter (Signed)
LMOM for pt that the orders were changed to [facility]. Call if questions.

## 2017-09-16 NOTE — Telephone Encounter (Signed)
[name], the patient called her PCP to see about doing the stools there and was told she needs to have the orders changed to [facility] orders in order to do so.  I told her I will call her when you get those changed.

## 2017-09-16 NOTE — Telephone Encounter (Signed)
Pt aware - lab will have to be changed from [facility] to [facility] for her to have these done here

## 2017-09-17 ENCOUNTER — Other Ambulatory Visit: Payer: Medicare Other

## 2017-09-17 ENCOUNTER — Telehealth: Payer: Self-pay

## 2017-09-17 NOTE — Telephone Encounter (Signed)
Appeal for Georga Hacking has been approved through 05/06/18

## 2017-09-17 NOTE — Telephone Encounter (Signed)
Wonderful to hear 

## 2017-09-18 ENCOUNTER — Other Ambulatory Visit: Payer: Medicare Other

## 2017-09-18 DIAGNOSIS — K921 Melena: Secondary | ICD-10-CM | POA: Diagnosis not present

## 2017-09-18 DIAGNOSIS — R197 Diarrhea, unspecified: Secondary | ICD-10-CM | POA: Diagnosis not present

## 2017-09-20 LAB — GI PROFILE, STOOL, PCR
ASTROVIRUS: NOT DETECTED
Adenovirus F 40/41: NOT DETECTED
C DIFFICILE TOXIN A/B: NOT DETECTED
CAMPYLOBACTER: NOT DETECTED
Cryptosporidium: NOT DETECTED
Cyclospora cayetanensis: NOT DETECTED
ENTAMOEBA HISTOLYTICA: NOT DETECTED
ENTEROPATHOGENIC E COLI: NOT DETECTED
Enteroaggregative E coli: NOT DETECTED
Enterotoxigenic E coli: NOT DETECTED
GIARDIA LAMBLIA: NOT DETECTED
NOROVIRUS GI/GII: NOT DETECTED
Plesiomonas shigelloides: NOT DETECTED
Rotavirus A: NOT DETECTED
SHIGELLA/ENTEROINVASIVE E COLI: NOT DETECTED
Salmonella: NOT DETECTED
Sapovirus: NOT DETECTED
Shiga-toxin-producing E coli: NOT DETECTED
VIBRIO CHOLERAE: NOT DETECTED
Vibrio: NOT DETECTED
YERSINIA ENTEROCOLITICA: NOT DETECTED

## 2017-09-23 ENCOUNTER — Telehealth: Payer: Self-pay | Admitting: Gastroenterology

## 2017-09-23 NOTE — Telephone Encounter (Signed)
LMOM to call.

## 2017-09-23 NOTE — Telephone Encounter (Signed)
Pt is aware and I called the RX to [facility] in [place] and left the message on VM.

## 2017-09-23 NOTE — Telephone Encounter (Signed)
Pt was returning a call from DS. Please call her at [phone]

## 2017-09-23 NOTE — Telephone Encounter (Signed)
See result note.  

## 2017-09-24 LAB — CLOSTRIDIUM DIFFICILE BY PCR: Toxigenic C. Difficile by PCR: NEGATIVE

## 2017-10-08 ENCOUNTER — Encounter (HOSPITAL_COMMUNITY): Payer: Self-pay | Admitting: Psychiatry

## 2017-10-08 ENCOUNTER — Ambulatory Visit (INDEPENDENT_AMBULATORY_CARE_PROVIDER_SITE_OTHER): Payer: Medicare Other | Admitting: Psychiatry

## 2017-10-08 VITALS — BP 126/73 | HR 79 | Ht 64.0 in | Wt 182.0 lb

## 2017-10-08 DIAGNOSIS — Z813 Family history of other psychoactive substance abuse and dependence: Secondary | ICD-10-CM | POA: Diagnosis not present

## 2017-10-08 DIAGNOSIS — F419 Anxiety disorder, unspecified: Secondary | ICD-10-CM

## 2017-10-08 DIAGNOSIS — F17210 Nicotine dependence, cigarettes, uncomplicated: Secondary | ICD-10-CM | POA: Diagnosis not present

## 2017-10-08 DIAGNOSIS — F332 Major depressive disorder, recurrent severe without psychotic features: Secondary | ICD-10-CM | POA: Diagnosis not present

## 2017-10-08 DIAGNOSIS — Z818 Family history of other mental and behavioral disorders: Secondary | ICD-10-CM | POA: Diagnosis not present

## 2017-10-08 DIAGNOSIS — Z79899 Other long term (current) drug therapy: Secondary | ICD-10-CM

## 2017-10-08 MED ORDER — TRAZODONE HCL 100 MG PO TABS: 300.0000 mg | ORAL_TABLET | Freq: Every day | ORAL | 2 refills | Status: DC

## 2017-10-08 MED ORDER — DULOXETINE HCL 60 MG PO CPEP: 60.0000 mg | ORAL_CAPSULE | Freq: Two times a day (BID) | ORAL | 2 refills | Status: DC

## 2017-10-08 MED ORDER — DIAZEPAM 10 MG PO TABS: 10.0000 mg | ORAL_TABLET | Freq: Three times a day (TID) | ORAL | 2 refills | Status: DC
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
## 2017-10-29 ENCOUNTER — Encounter (HOSPITAL_COMMUNITY)
Admission: RE | Admit: 2017-10-29 | Discharge: 2017-10-29 | Disposition: A | Discharge status: Home / Self Care | Payer: Medicare Other | Source: Ambulatory Visit | Attending: Gastroenterology | Admitting: Gastroenterology

## 2017-11-05 ENCOUNTER — Ambulatory Visit (HOSPITAL_COMMUNITY): Payer: Medicare Other | Admitting: Anesthesiology

## 2017-11-05 ENCOUNTER — Encounter (HOSPITAL_COMMUNITY)
Admission: RE | Disposition: A | Discharge status: Home / Self Care | Payer: Self-pay | Source: Ambulatory Visit | Attending: Gastroenterology

## 2017-11-05 ENCOUNTER — Ambulatory Visit (HOSPITAL_COMMUNITY)
Admission: RE | Admit: 2017-11-05 | Discharge: 2017-11-05 | Disposition: A | Discharge status: Home / Self Care | Payer: Medicare Other | Source: Ambulatory Visit | Attending: Gastroenterology | Admitting: Gastroenterology

## 2017-11-05 ENCOUNTER — Encounter (HOSPITAL_COMMUNITY): Payer: Self-pay | Admitting: *Deleted

## 2017-11-05 DIAGNOSIS — E785 Hyperlipidemia, unspecified: Secondary | ICD-10-CM | POA: Insufficient documentation

## 2017-11-05 DIAGNOSIS — K2980 Duodenitis without bleeding: Secondary | ICD-10-CM | POA: Diagnosis not present

## 2017-11-05 DIAGNOSIS — D125 Benign neoplasm of sigmoid colon: Secondary | ICD-10-CM | POA: Diagnosis not present

## 2017-11-05 DIAGNOSIS — K648 Other hemorrhoids: Secondary | ICD-10-CM | POA: Insufficient documentation

## 2017-11-05 DIAGNOSIS — Z79899 Other long term (current) drug therapy: Secondary | ICD-10-CM | POA: Insufficient documentation

## 2017-11-05 DIAGNOSIS — K219 Gastro-esophageal reflux disease without esophagitis: Secondary | ICD-10-CM | POA: Diagnosis not present

## 2017-11-05 DIAGNOSIS — F329 Major depressive disorder, single episode, unspecified: Secondary | ICD-10-CM | POA: Diagnosis not present

## 2017-11-05 DIAGNOSIS — F4310 Post-traumatic stress disorder, unspecified: Secondary | ICD-10-CM | POA: Diagnosis not present

## 2017-11-05 DIAGNOSIS — K2970 Gastritis, unspecified, without bleeding: Secondary | ICD-10-CM | POA: Diagnosis not present

## 2017-11-05 DIAGNOSIS — R197 Diarrhea, unspecified: Secondary | ICD-10-CM | POA: Diagnosis not present

## 2017-11-05 DIAGNOSIS — R1013 Epigastric pain: Secondary | ICD-10-CM

## 2017-11-05 DIAGNOSIS — K921 Melena: Secondary | ICD-10-CM

## 2017-11-05 DIAGNOSIS — Q438 Other specified congenital malformations of intestine: Secondary | ICD-10-CM | POA: Diagnosis not present

## 2017-11-05 DIAGNOSIS — J449 Chronic obstructive pulmonary disease, unspecified: Secondary | ICD-10-CM | POA: Diagnosis not present

## 2017-11-05 DIAGNOSIS — Z853 Personal history of malignant neoplasm of breast: Secondary | ICD-10-CM | POA: Diagnosis not present

## 2017-11-05 DIAGNOSIS — K625 Hemorrhage of anus and rectum: Secondary | ICD-10-CM

## 2017-11-05 DIAGNOSIS — F17210 Nicotine dependence, cigarettes, uncomplicated: Secondary | ICD-10-CM | POA: Insufficient documentation

## 2017-11-05 HISTORY — PX: POLYPECTOMY: SHX5525

## 2017-11-05 HISTORY — PX: COLONOSCOPY WITH PROPOFOL: SHX5780

## 2017-11-05 HISTORY — PX: BIOPSY: SHX5522

## 2017-11-05 HISTORY — PX: ESOPHAGOGASTRODUODENOSCOPY (EGD) WITH PROPOFOL: SHX5813

## 2017-11-05 LAB — CBC WITH DIFFERENTIAL/PLATELET
Basophils Absolute: 0 10*3/uL (ref 0.0–0.1)
Basophils Relative: 0 %
Eosinophils Absolute: 0.1 10*3/uL (ref 0.0–0.7)
Eosinophils Relative: 1 %
HEMATOCRIT: 44 % (ref 36.0–46.0)
HEMOGLOBIN: 14.3 g/dL (ref 12.0–15.0)
LYMPHS ABS: 2.7 10*3/uL (ref 0.7–4.0)
LYMPHS PCT: 29 %
MCH: 30.8 pg (ref 26.0–34.0)
MCHC: 32.5 g/dL (ref 30.0–36.0)
MCV: 94.6 fL (ref 78.0–100.0)
Monocytes Absolute: 0.6 10*3/uL (ref 0.1–1.0)
Monocytes Relative: 6 %
NEUTROS ABS: 6.1 10*3/uL (ref 1.7–7.7)
NEUTROS PCT: 64 %
Platelets: 277 10*3/uL (ref 150–400)
RBC: 4.65 MIL/uL (ref 3.87–5.11)
RDW: 13.2 % (ref 11.5–15.5)
WBC: 9.6 10*3/uL (ref 4.0–10.5)

## 2017-11-05 SURGERY — COLONOSCOPY WITH PROPOFOL
Anesthesia: Monitor Anesthesia Care

## 2017-11-05 MED ORDER — CHLORHEXIDINE GLUCONATE CLOTH 2 % EX PADS: 6.0000 | MEDICATED_PAD | Freq: Once | CUTANEOUS | Status: DC

## 2017-11-05 MED ORDER — LACTATED RINGERS IV SOLN: INTRAVENOUS | Status: DC

## 2017-11-05 MED ORDER — ONDANSETRON HCL 4 MG/2ML IJ SOLN
INTRAMUSCULAR | Status: DC | PRN
  Administered 2017-11-05: 4 mg via INTRAVENOUS

## 2017-11-05 MED ORDER — GLYCOPYRROLATE 0.2 MG/ML IJ SOLN
INTRAMUSCULAR | Status: AC
  Filled 2017-11-05: qty 1

## 2017-11-05 MED ORDER — MEPERIDINE HCL 100 MG/ML IJ SOLN: 6.2500 mg | INTRAMUSCULAR | Status: DC | PRN

## 2017-11-05 MED ORDER — LIDOCAINE VISCOUS HCL 2 % MT SOLN
OROMUCOSAL | Status: AC
  Filled 2017-11-05: qty 15

## 2017-11-05 MED ORDER — PROPOFOL 500 MG/50ML IV EMUL
INTRAVENOUS | Status: DC | PRN
  Administered 2017-11-05: 125 ug/kg/min via INTRAVENOUS

## 2017-11-05 MED ORDER — PROPOFOL 10 MG/ML IV BOLUS
INTRAVENOUS | Status: AC
  Filled 2017-11-05: qty 40

## 2017-11-05 MED ORDER — HYDROMORPHONE HCL 1 MG/ML IJ SOLN: 0.2500 mg | INTRAMUSCULAR | Status: DC | PRN

## 2017-11-05 MED ORDER — PROPOFOL 10 MG/ML IV BOLUS
INTRAVENOUS | Status: DC | PRN
  Administered 2017-11-05 (×2): 20 mg via INTRAVENOUS

## 2017-11-05 MED ORDER — LACTATED RINGERS IV SOLN
INTRAVENOUS | Status: DC
  Administered 2017-11-05: 1000 mL via INTRAVENOUS

## 2017-11-05 MED ORDER — ONDANSETRON HCL 4 MG/2ML IJ SOLN
INTRAMUSCULAR | Status: AC
  Filled 2017-11-05: qty 2

## 2017-11-05 MED ORDER — MIDAZOLAM HCL 2 MG/2ML IJ SOLN
INTRAMUSCULAR | Status: AC
  Filled 2017-11-05: qty 2

## 2017-11-05 MED ORDER — MIDAZOLAM HCL 5 MG/5ML IJ SOLN
INTRAMUSCULAR | Status: DC | PRN
  Administered 2017-11-05: 2 mg via INTRAVENOUS

## 2017-11-05 MED ORDER — GLYCOPYRROLATE 0.2 MG/ML IJ SOLN
INTRAMUSCULAR | Status: DC | PRN
  Administered 2017-11-05: 0.2 mg via INTRAVENOUS

## 2017-11-05 MED ORDER — PROMETHAZINE HCL 25 MG/ML IJ SOLN: 6.2500 mg | INTRAMUSCULAR | Status: DC | PRN

## 2017-11-05 NOTE — Anesthesia Preprocedure Evaluation (Signed)
Anesthesia Evaluation  Patient identified by MRN, date of birth, ID band Patient awake    Reviewed: Allergy & Precautions, H&P , NPO status , Patient's Chart, lab work & pertinent test results, reviewed documented beta blocker date and time   History of Anesthesia Complications (+) PONV and history of anesthetic complications  Airway Mallampati: II  TM Distance: >3 FB Neck ROM: full    Dental no notable dental hx. (+) Edentulous Upper, Edentulous Lower   Pulmonary neg pulmonary ROS, shortness of breath and with exertion, COPD,  COPD inhaler, Current Smoker,    Pulmonary exam normal breath sounds clear to auscultation       Cardiovascular Exercise Tolerance: Good negative cardio ROS   Rhythm:regular Rate:Normal     Neuro/Psych  Headaches,  Neuromuscular disease negative neurological ROS  negative psych ROS   GI/Hepatic negative GI ROS, Neg liver ROS, GERD  Medicated,  Endo/Other  negative endocrine ROS  Renal/GU negative Renal ROS  negative genitourinary   Musculoskeletal   Abdominal   Peds  Hematology negative hematology ROS (+)   Anesthesia Other Findings Active smoker with COPD, inhaler, NO home oxygen C/o "acid reflux" despite medications   Reproductive/Obstetrics negative OB ROS                             Anesthesia Physical Anesthesia Plan  ASA: IV  Anesthesia Plan: MAC   Post-op Pain Management:    Induction:   PONV Risk Score and Plan:   Airway Management Planned:   Additional Equipment:   Intra-op Plan:   Post-operative Plan:   Informed Consent: I have reviewed the patients History and Physical, chart, labs and discussed the procedure including the risks, benefits and alternatives for the proposed anesthesia with the patient or authorized representative who has indicated his/her understanding and acceptance.   Dental Advisory Given  Plan Discussed with: CRNA  and Anesthesiologist  Anesthesia Plan Comments:         Anesthesia Quick Evaluation

## 2017-11-05 NOTE — Transfer of Care (Signed)
Immediate Anesthesia Transfer of Care Note  Patient: [name]  Procedure(s) Performed: COLONOSCOPY WITH PROPOFOL (N/A ) ESOPHAGOGASTRODUODENOSCOPY (EGD) WITH PROPOFOL (N/A ) BIOPSY POLYPECTOMY  Patient Location: PACU  Anesthesia Type:MAC  Level of Consciousness: awake, alert  and oriented  Airway & Oxygen Therapy: Patient Spontanous Breathing  Post-op Assessment: Report given to RN  Post vital signs: Reviewed and stable  Last Vitals:  Vitals Value Taken Time  BP    Temp    Pulse 103 11/05/2017 12:30 PM  Resp 25 11/05/2017 12:31 PM  SpO2 77 % 11/05/2017 12:30 PM  Vitals shown include unvalidated device data.  Last Pain:  Vitals:   11/05/17 1024  TempSrc: Oral  PainSc: 0-No pain      Patients Stated Pain Goal: 8 (70/48/88 9169)  Complications: No apparent anesthesia complications

## 2017-11-05 NOTE — H&P (Signed)
Primary Care Physician:  [name], DO Primary Gastroenterologist:  Dr. Oneida Alar  Pre-Procedure History & Physical: HPI:  [name] is a 57 y.o. female here for Diarrhea/BRBPR/abdominal pain.  Past Medical History:  Diagnosis Date  . Allergy   . Anxiety   . Blood transfusion without reported diagnosis   .    [name] Breast cancer ([place]) 04/21/2012   Stage II (T1c N1 M0) grade 3 triple negative breast cancer, right- sided with 1 of 5 positive nodes, status post FEC in a dose dense fashion for 6 cycles followed by radiation therapy by Dr. Sondra Come for her high-risk disease with her initial date of surgery in February 2008.   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 COPD (chronic obstructive pulmonary disease) ([place])   . Depression       . GERD (gastroesophageal reflux disease) 12/18/2012  . Headache   . Hyperlipidemia   . Neuropathy   . Osteoporosis   . PONV (postoperative nausea and vomiting)   . PTSD (post-traumatic stress disorder)   . Substance abuse [facility])     Past Surgical History:  Procedure Laterality Date  . BREAST SURGERY    . CATARACT EXTRACTION W/PHACO Left 08/15/2017   Procedure: CATARACT EXTRACTION WITH PHACOEMULSIFICATION  AND INTRAOCULAR LENS PLACEMENT LEFT EYE CDE=2.68;  Surgeon: Baruch Goldmann, MD;  Location: [facility];  Service: Ophthalmology;  Laterality: Left;  left  . CATARACT EXTRACTION W/PHACO Right 09/06/2017   Procedure: CATARACT EXTRACTION PHACO AND INTRAOCULAR LENS PLACEMENT (IOC);  Surgeon: Baruch Goldmann, MD;  Location: [facility];  Service: Ophthalmology;  Laterality: Right;  CDE: 1.61  . ESOPHAGOGASTRODUODENOSCOPY  03/10/2008   SLF: Normal esophagus without evidence of Barrett, mass, erosion  ulceration, or stricture, small bowel bx negative, gastritis with NO h.pylori  . leocolonoscopy  03/10/2008   GEX:BMWUXL terminal ileum, approximately 10 cm visualized/Normal colon without evidence of polyps, mass, inflammatory changes, diverticula, or  AVMs/Normal retroflexed view of the rectum. random colon bx negative.  [name] MASTECTOMY     bilateral, status post reconstruction.  . TUBAL LIGATION      Prior to Admission medications   Medication Sig Start Date End Date Taking? Authorizing Provider  albuterol (PROVENTIL HFA;VENTOLIN HFA) 108 (90 Base) MCG/ACT inhaler Inhale 2 puffs into the lungs every 6 (six) hours as needed for wheezing or shortness of breath. 04/18/17  Yes Raylene Everts, MD  cholecalciferol (VITAMIN D) 1000 units tablet Take 1,000 Units by mouth daily.   Yes [provider]  clotrimazole-betamethasone (LOTRISONE) cream Apply 1 application topically 2 (two) times daily. To corner of mouth 11/23/16  Yes Raylene Everts, MD  Crisaborole (EUCRISA) 2 % OINT Apply 1 g topically daily. Apply to affected areas on face daily for eczema. 09/02/17  Yes Gottschalk, Ashly M, DO  diazepam (VALIUM) 10 MG tablet Take 1 tablet (10 mg total) by mouth 3 (three) times daily. 10/08/17  Yes Cloria Spring, MD  DULoxetine (CYMBALTA) 60 MG capsule Take 1 capsule (60 mg total) by mouth 2 (two) times daily. 10/08/17  Yes Cloria Spring, MD  fexofenadine (ALLEGRA) 180 MG tablet Take 180 mg by mouth daily as needed for allergies or rhinitis.   Yes [provider]  fluticasone-salmeterol (ADVAIR HFA) 115-21 MCG/ACT inhaler Inhale 2 puffs into the lungs 2 (two) times daily. Patient taking differently: Inhale 2 puffs into the lungs 2 (two) times daily as needed (shortness of breath).  04/18/17  Yes Raylene Everts, MD  pantoprazole ([place]) 40 MG  tablet Take 1 tablet (40 mg total) by mouth daily before breakfast. 09/03/17  Yes Mahala Menghini, PA-C  pravastatin (PRAVACHOL) 20 MG tablet Take 1 tablet (20 mg total) by mouth at bedtime. 07/30/17  Yes Raylene Everts, MD  traZODone (DESYREL) 100 MG tablet Take 3 tablets (300 mg total) by mouth at bedtime. 10/08/17  Yes Cloria Spring, MD  triamcinolone cream (KENALOG) 0.1 % Apply 1  application topically 2 (two) times daily. Until rash clears 07/19/16  Yes Raylene Everts, MD  vitamin C (ASCORBIC ACID) 500 MG tablet Take 500 mg by mouth daily.   Yes [provider]    Allergies as of 09/03/2017 - Review Complete 09/03/2017  Allergen Reaction Noted  . Vicodin [hydrocodone-acetaminophen] Nausea Only 07/15/2013    Family History  Problem Relation Age of Onset  . COPD Mother   . Depression Mother   . Heart disease Mother   . Hyperlipidemia Mother   . Hypertension Mother   . Cancer Mother        lung cancer, to bone  . Cancer Father        skin  . Heart disease Father   . Hypertension Father   . Hyperlipidemia Father   . Learning disabilities Father   . Drug abuse Brother        overdose at 43  . Drug abuse Daughter        drug overdose 74  . Cancer Maternal Uncle        lung cancer  . Colon cancer Neg Hx     Social History   Socioeconomic History  . Marital status: Divorced    Spouse name: Not on file  . Number of children: 2  . Years of education: 8  . Highest education level: Not on file  Occupational History  . Occupation: disabled    Comment: neuropathy from [place]  . Financial resource strain: Not on file  . Food insecurity:    Worry: Not on file    Inability: Not on file  . Transportation needs:    Medical: Not on file    Non-medical: Not on file  Tobacco Use  . Smoking status: Current Every Day Smoker    Packs/day: 0.50    Years: 20.00    Pack years: 10.00    Types: Cigarettes    Start date: 05/07/1992  . Smokeless tobacco: Never Used  . Tobacco comment: discussed  Substance and Sexual Activity  . Alcohol use: Yes    Alcohol/week: 3.6 oz    Types: 6 Shots of liquor per week    Comment: occ  . Drug use: No  . Sexual activity: Yes    Birth control/protection: Post-menopausal  Lifestyle  . Physical activity:    Days per week: Not on file    Minutes per session: Not on file  . Stress: Not on file   Relationships  . Social connections:    Talks on phone: Not on file    Gets together: Not on file    Attends religious service: Not on file    Active member of club or organization: Not on file    Attends meetings of clubs or organizations: Not on file    Relationship status: Not on file  . Intimate partner violence:    Fear of current or ex partner: Not on file    Emotionally abused: Not on file    Physically abused: Not on file    Forced  sexual activity: Not on file  Other Topics Concern  . Not on file  Social History Narrative   Divorced   Lives alone   Spends time with boyfriend - he is a smoker    Review of Systems: See HPI, otherwise negative ROS   Physical Exam: BP (!) 117/59   Pulse 74   Temp 98.4 F (36.9 C) (Oral)   Resp (!) 22   LMP 07/09/2013   SpO2 94%  General:   Alert,  pleasant and cooperative in NAD Head:  Normocephalic and atraumatic. Neck:  Supple; Lungs:  Clear throughout to auscultation.    Heart:  Regular rate and rhythm. Abdomen:  Soft, nontender and nondistended. Normal bowel sounds, without guarding, and without rebound.   Neurologic:  Alert and  oriented x4;  grossly normal neurologically.  Impression/Plan:     Diarrhea/BRBPR/abdominal pain  PLAN: EGD/TCS TODAY WITH BIOPSY.DISCUSSED PROCEDURE, BENEFITS, & RISKS: < 1% chance of medication reaction, bleeding, perforation, or rupture of spleen/liver.

## 2017-11-05 NOTE — Anesthesia Postprocedure Evaluation (Signed)
Anesthesia Post Note  Patient: [name]  Procedure(s) Performed: COLONOSCOPY WITH PROPOFOL (N/A ) ESOPHAGOGASTRODUODENOSCOPY (EGD) WITH PROPOFOL (N/A ) BIOPSY POLYPECTOMY  Patient location during evaluation: PACU Anesthesia Type: MAC Level of consciousness: oriented and awake and alert Pain management: pain level controlled Vital Signs Assessment: post-procedure vital signs reviewed and stable Respiratory status: spontaneous breathing Cardiovascular status: stable Postop Assessment: no apparent nausea or vomiting Anesthetic complications: no     Last Vitals:  Vitals:   11/05/17 1025 11/05/17 1030  BP: (!) 117/59   Pulse:    Resp: 17 (!) 22  Temp:    SpO2: 95% 94%    Last Pain:  Vitals:   11/05/17 1024  TempSrc: Oral  PainSc: 0-No pain                 Margrette Wynia

## 2017-11-05 NOTE — Discharge Instructions (Signed)
You have internal hemorrhoids, and HAD 1 polyp removed. You have mild gastritis.I biopsied your stomach, small bowel, and colon.   DRINK WATER TO KEEP YOUR URINE LIGHT YELLOW.  CONTINUE YOUR WEIGHT LOSS EFFORTS. YOUR BODY MASS INDEX IS OVER 30 WHICH MEANS YOU ARE OBESE. OBESITY IS ASSOCIATED WITH AN INCREASED FOR CIRRHOSIS AND ALL CANCERS, INCLUDING ESOPHAGEAL AND COLON CANCER. A WEIGHT OF 170 LBS OR LESS  WILL GET YOUR BODY MASS INDEX(BMI) UNDER 30.  FOLLOW A HIGH FIBER/Ldairy free DIET. AVOID ITEMS THAT CAUSE BLOATING. SEE INFO BELOW.   CONTINUE PROTONIX. TAKE 30 MINUTES PRIOR TO BREAKFAST.  USE PREPARATION H FOUR TIMES  A DAY IF NEEDED TO RELIEVE RECTAL PAIN/PRESSURE/BLEEDING.  YOUR BIOPSY WILL BE BACK IN 7 DAYS.   FOLLOW UP IN 4 MOS.   Next colonoscopy in 5-10 years.   ENDOSCOPY Care After Read the instructions outlined below and refer to this sheet in the next week. These discharge instructions provide you with general information on caring for yourself after you leave the hospital. While your treatment has been planned according to the most current medical practices available, unavoidable complications occasionally occur. If you have any problems or questions after discharge, call DR. Lavin Petteway, [phone].  ACTIVITY  You may resume your regular activity, but move at a slower pace for the next 24 hours.   Take frequent rest periods for the next 24 hours.   Walking will help get rid of the air and reduce the bloated feeling in your belly (abdomen).   No driving for 24 hours (because of the medicine (anesthesia) used during the test).   You may shower.   Do not sign any important legal documents or operate any machinery for 24 hours (because of the anesthesia used during the test).    NUTRITION  Drink plenty of fluids.   You may resume your normal diet as instructed by your doctor.   Begin with a light meal and progress to your normal diet. Heavy or fried foods are  harder to digest and may make you feel sick to your stomach (nauseated).   Avoid alcoholic beverages for 24 hours or as instructed.    MEDICATIONS  You may resume your normal medications.   WHAT YOU CAN EXPECT TODAY  Some feelings of bloating in the abdomen.   Passage of more gas than usual.   Spotting of blood in your stool or on the toilet paper  .  IF YOU HAD POLYPS REMOVED DURING THE ENDOSCOPY:  Eat a soft diet IF YOU HAVE NAUSEA, BLOATING, ABDOMINAL PAIN, OR VOMITING.    FINDING OUT THE RESULTS OF YOUR TEST Not all test results are available during your visit. DR. Oneida Alar WILL CALL YOU WITHIN 7 DAYS OF YOUR PROCEDUE WITH YOUR RESULTS. Do not assume everything is normal if you have not heard from DR. Johnthomas Lader IN ONE WEEK, CALL HER OFFICE AT [phone].  SEEK IMMEDIATE MEDICAL ATTENTION AND CALL THE OFFICE: [phone] IF:  You have more than a spotting of blood in your stool.   Your belly is swollen (abdominal distention).   You are nauseated or vomiting.   You have a temperature over 101F.   You have abdominal pain or discomfort that is severe or gets worse throughout the day.   Lactose Free Diet Lactose is a carbohydrate that is found mainly in milk and milk products, as well as in foods with added milk or whey. Lactose must be digested by the enzyme in order to be used  by the body. Lactose intolerance occurs when there is a shortage of lactase. When your body is not able to digest lactose, you may feel sick to your stomach (nausea), bloating, cramping, gas and diarrhea.  Tolerance to lactose varies widely   There are many dairy products that may be tolerated better than milk by some people:  The use of cultured dairy products such as yogurt, buttermilk, cottage cheese, and sweet acidophilus milk (Kefir) for lactase-deficient individuals is usually well tolerated. This is because the healthy bacteria help digest lactose.   Lactose-hydrolyzed milk (Lactaid)  contains 40-90% less lactose than milk and may also be well tolerated.    SPECIAL NOTES  Lactose is a carbohydrates. The major food source is dairy products. Reading food labels is important. Many products contain lactose even when they are not made from milk. Look for the following words: whey, milk solids, dry milk solids, nonfat dry milk powder. Typical sources of lactose other than dairy products include breads, candies, cold cuts, prepared and processed foods, and commercial sauces and gravies.   All foods must be prepared without milk, cream, or other dairy foods.   Soy milk and lactose-free supplements (LACTASE) may be used as an alternative to milk.   FOOD GROUP ALLOWED/RECOMMENDED AVOID/USE SPARINGLY  BREADS / STARCHES 4 servings or more* Breads and rolls made without milk. [place], [place], or [place] bread. Breads and rolls that contain milk. Prepared mixes such as muffins, biscuits, waffles, pancakes. Sweet rolls, donuts, [place] toast (if made with milk or lactose).  Crackers: Soda crackers, graham crackers. Any crackers prepared without lactose. Zwieback crackers, corn curls, or any that contain lactose.  Cereals: Cooked or dry cereals prepared without lactose (read labels). Cooked or dry cereals prepared with lactose (read labels). Total, Cocoa Krispies. Special K.  Potatoes / Pasta / Rice: Any prepared without milk or lactose. Popcorn. Instant potatoes, frozen [place] fries, scalloped or au gratin potatoes.  VEGETABLES 2 servings or more Fresh, frozen, and canned vegetables. Creamed or breaded vegetables. Vegetables in a cheese sauce or with lactose-containing margarines.  FRUIT 2 servings or more All fresh, canned, or frozen fruits that are not processed with lactose. Any canned or frozen fruits processed with lactose.  MEAT & SUBSTITUTES 2 servings or more (4 to 6 oz. total per day) Plain beef, chicken, fish, [place], lamb, veal, pork, or ham. Kosher prepared meat products.  Strained or junior meats that do not contain milk. Eggs, soy meat substitutes, nuts. Scrambled eggs, omelets, and souffles that contain milk. Creamed or breaded meat, fish, or fowl. Sausage products such as wieners, liver sausage, or cold cuts that contain milk solids. Cheese, cottage cheese, or cheese spreads.  MILK None. (See BEVERAGES for milk substitutes. See DESSERTS for ice cream and frozen desserts.) Milk (whole, 2%, skim, or chocolate). Evaporated, powdered, or condensed milk; malted milk.  SOUPS & COMBINATION FOODS Bouillon, broth, vegetable soups, clear soups, consomms. Homemade soups made with allowed ingredients. Combination or prepared foods that do not contain milk or milk products (read labels). Cream soups, chowders, commercially prepared soups containing lactose. Macaroni and cheese, pizza. Combination or prepared foods that contain milk or milk products.  DESSERTS & SWEETS In moderation Water and fruit ices; gelatin; angel food cake. Homemade cookies, pies, or cakes made from allowed ingredients. Pudding (if made with water or a milk substitute). Lactose-free tofu desserts. Sugar, honey, corn syrup, jam, jelly; marmalade; molasses (beet sugar); Pure sugar candy; marshmallows. Ice cream, ice milk, sherbet, custard, pudding, frozen yogurt.  Commercial cake and cookie mixes. Desserts that contain chocolate. Pie crust made with milk-containing margarine; reduced-calorie desserts made with a sugar substitute that contains lactose. Toffee, peppermint, butterscotch, chocolate, caramels.  FATS & OILS In moderation Butter (as tolerated; contains very small amounts of lactose). Margarines and dressings that do not contain milk, Vegetable oils, shortening, Miracle Whip, mayonnaise, nondairy cream & whipped toppings without lactose or milk solids added (examples: Coffee Rich, Carnation Coffeemate, Rich's Whipped Topping, PolyRich). Berniece Salines. Margarines and salad dressings containing milk; cream,  cream cheese; peanut butter with added milk solids, sour cream, chip dips, made with sour cream.  BEVERAGES Carbonated drinks; tea; coffee and freeze-dried coffee; some instant coffees (check labels). Fruit drinks; fruit and vegetable juice; Rice or Soy milk. Ovaltine, hot chocolate. Some cocoas; some instant coffees; instant iced teas; powdered fruit drinks (read labels).   CONDIMENTS / MISCELLANEOUS Soy sauce, carob powder, olives, gravy made with water, baker's cocoa, pickles, pure seasonings and spices, wine, pure monosodium glutamate, catsup, mustard. Some chewing gums, chocolate, some cocoas. Certain antibiotics and vitamin / mineral preparations. Spice blends if they contain milk products. MSG extender. Artificial sweeteners that contain lactose such as Equal (Nutra-Sweet) and Sweet 'n Low. Some nondairy creamers (read labels).  SAMPLE MENU*  Breakfast   Orange Juice.  Banana.   Bran flakes.   Nondairy Creamer.  [place] Bread (toasted).   Butter or milk-free margarine.   Coffee or tea.    Noon Meal   Chicken Breast.  Rice.   Green beans.   Butter or milk-free margarine.  Fresh melon.   Coffee or tea.    Evening Meal   Roast Beef.  Baked potato.   Butter or milk-free margarine.   Broccoli.   Lettuce salad with vinegar and oil dressing.  [employer]   Coffee or tea.        Polyps, Colon  A polyp is extra tissue that grows inside your body. Colon polyps grow in the large intestine. The large intestine, also called the colon, is part of your digestive system. It is a long, hollow tube at the end of your digestive tract where your body makes and stores stool. Most polyps are not dangerous. They are benign. This means they are not cancerous. But over time, some types of polyps can turn into cancer. Polyps that are smaller than a pea are usually not harmful. But larger polyps could someday become or may already be cancerous. To be safe, doctors remove  all polyps and test them.   PREVENTION There is not one sure way to prevent polyps. You might be able to lower your risk of getting them if you:  Eat more fruits and vegetables and less fatty food.   Do not smoke.   Avoid alcohol.   Exercise every day.   Lose weight if you are overweight.   Eating more calcium and folate can also lower your risk of getting polyps. Some foods that are rich in calcium are milk, cheese, and broccoli. Some foods that are rich in folate are chickpeas, kidney beans, and spinach.    Gastritis  Gastritis is an inflammation (the body's way of reacting to injury and/or infection) of the stomach. It is often caused by viral or bacterial (germ) infections. It can also be caused BY ASPIRIN, BC/GOODY POWDER'S, (IBUPROFEN) MOTRIN, OR ALEVE (NAPROXEN), chemicals (including alcohol), SPICY FOODS, and medications. This illness may be associated with generalized malaise (feeling tired, not well), UPPER ABDOMINAL STOMACH cramps, and fever. One common  bacterial cause of gastritis is an organism known as H. Pylori. This can be treated with antibiotics.    High-Fiber Diet A high-fiber diet changes your normal diet to include more whole grains, legumes, fruits, and vegetables. Changes in the diet involve replacing refined carbohydrates with unrefined foods. The calorie level of the diet is essentially unchanged. The Dietary Reference Intake (recommended amount) for adult males is 38 grams per day. For adult females, it is 25 grams per day. Pregnant and lactating women should consume 28 grams of fiber per day. Fiber is the intact part of a plant that is not broken down during digestion. Functional fiber is fiber that has been isolated from the plant to provide a beneficial effect in the body. PURPOSE  Increase stool bulk.   Ease and regulate bowel movements.   Lower cholesterol.   REDUCE RISK OF COLON CANCER  INDICATIONS THAT YOU NEED MORE FIBER  Constipation and  hemorrhoids.   Uncomplicated diverticulosis (intestine condition) and irritable bowel syndrome.   Weight management.   As a protective measure against hardening of the arteries (atherosclerosis), diabetes, and cancer.   GUIDELINES FOR INCREASING FIBER IN THE DIET  Start adding fiber to the diet slowly. A gradual increase of about 5 more grams (2 slices of whole-wheat bread, 2 servings of most fruits or vegetables, or 1 bowl of high-fiber cereal) per day is best. Too rapid an increase in fiber may result in constipation, flatulence, and bloating.   Drink enough water and fluids to keep your urine clear or pale yellow. Water, juice, or caffeine-free drinks are recommended. Not drinking enough fluid may cause constipation.   Eat a variety of high-fiber foods rather than one type of fiber.   Try to increase your intake of fiber through using high-fiber foods rather than fiber pills or supplements that contain small amounts of fiber.   The goal is to change the types of food eaten. Do not supplement your present diet with high-fiber foods, but replace foods in your present diet.    INCLUDE A VARIETY OF FIBER SOURCES  Replace refined and processed grains with whole grains, canned fruits with fresh fruits, and incorporate other fiber sources. White rice, white breads, and most bakery goods contain little or no fiber.   Brown whole-grain rice, buckwheat oats, and many fruits and vegetables are all good sources of fiber. These include: broccoli, Brussels sprouts, cabbage, cauliflower, beets, sweet potatoes, white potatoes (skin on), carrots, tomatoes, eggplant, squash, berries, fresh fruits, and dried fruits.   Cereals appear to be the richest source of fiber. Cereal fiber is found in whole grains and bran. Bran is the fiber-rich outer coat of cereal grain, which is largely removed in refining. In whole-grain cereals, the bran remains. In breakfast cereals, the largest amount of fiber is found in  those with "bran" in their names. The fiber content is sometimes indicated on the label.   You may need to include additional fruits and vegetables each day.   In baking, for 1 cup white flour, you may use the following substitutions:   1 cup whole-wheat flour minus 2 tablespoons.   1/2 cup white flour plus 1/2 cup whole-wheat flour.    Hemorrhoids Hemorrhoids are dilated (enlarged) veins around the rectum. Sometimes clots will form in the veins. This makes them swollen and painful. These are called thrombosed hemorrhoids. Causes of hemorrhoids include:  Constipation.   Straining to have a bowel movement.   HEAVY LIFTING  HOME CARE INSTRUCTIONS  Eat  a well balanced diet and drink 6 to 8 glasses of water every day to avoid constipation. You may also use a bulk laxative.   Avoid straining to have bowel movements.   Keep anal area dry and clean.   Do not use a donut shaped pillow or sit on the toilet for long periods. This increases blood pooling and pain.   Move your bowels when your body has the urge; this will require less straining and will decrease pain and pressure.

## 2017-11-05 NOTE — Op Note (Signed)
[facility] Patient Name: [name] Procedure Date: 11/05/2017 12:13 PM MRN: [id number] Date of Birth: 1960-11-11 Attending MD: Barney Drain MD, MD CSN: [id number] Age: 57 Admit Type: Outpatient Procedure:                Upper GI endoscopy WITH COLD FORCEPS BIOPSY Indications:              Dyspepsia, Melena, Diarrhea-MAR 2019 Hb 14.1.                            DRINKS MIL, EATS CHEESE, AND OCCASIONAL ICE CREAM Providers:                Barney Drain MD, MD, Gwenlyn Fudge RN, RN, Aram Candela Referring MD:             Koleen Distance. Gottschalk Medicines:                Propofol per Anesthesia Complications:            No immediate complications. Estimated Blood Loss:     Estimated blood loss was minimal. Procedure:                Pre-Anesthesia Assessment:                           - Prior to the procedure, a History and Physical                            was performed, and patient medications and                            allergies were reviewed. The patient's tolerance of                            previous anesthesia was also reviewed. The risks                            and benefits of the procedure and the sedation                            options and risks were discussed with the patient.                            All questions were answered, and informed consent                            was obtained. Prior Anticoagulants: The patient has                            taken no previous anticoagulant or antiplatelet                            agents. ASA Grade Assessment: II - A patient with  mild systemic disease. After reviewing the risks                            and benefits, the patient was deemed in                            satisfactory condition to undergo the procedure.                           After obtaining informed consent, the endoscope was                            passed under direct vision. Throughout  the                            procedure, the patient's blood pressure, pulse, and                            oxygen saturations were monitored continuously. The                            [id number] ([id number]) scope was introduced through the                            mouth, and advanced to the second part of duodenum.                            The upper GI endoscopy was accomplished without                            difficulty. The patient tolerated the procedure                            well. Scope In: 12:15:22 PM Scope Out: 12:24:09 PM Total Procedure Duration: 0 hours 8 minutes 47 seconds  Findings:      The examined esophagus was normal.      Localized mild inflammation characterized by congestion (edema) and       erythema was found in the gastric antrum. Biopsies were taken with a       cold forceps for Helicobacter pylori testing.      Localized mild inflammation characterized by congestion (edema),       erosions and erythema was found in the duodenal bulb. Biopsies for       histology were taken with a cold forceps for evaluation of celiac       disease.      The second portion of the duodenum was normal. Biopsies for histology       were taken with a cold forceps for evaluation of celiac disease. Impression:               - DIARRHEA MOST LIKELY DUE TO DAIRY INTAKE/IBS                           - MILD Gastritis/Duodenitis. Biopsied. Moderate Sedation:      Per Anesthesia Care Recommendation:           -  Await pathology results. CBC TODAY.                           - High fiber diet and lactose free diet.                           - Continue present medications.                           - Return to my office in 4 months.                           - Patient has a contact number available for                            emergencies. The signs and symptoms of potential                            delayed complications were discussed with the                             patient. Return to normal activities tomorrow.                            Written discharge instructions were provided to the                            patient. Procedure Code(s):        --- Professional ---                           [phone], Esophagogastroduodenoscopy, flexible,                            transoral; with biopsy, single or multiple Diagnosis Code(s):        --- Professional ---                           K29.70, Gastritis, unspecified, without bleeding                           K29.80, Duodenitis without bleeding                           R10.13, Epigastric pain                           K92.1, Melena (includes Hematochezia)                           R19.7, Diarrhea, unspecified CPT copyright 2017 [employer]. All rights reserved. The codes documented in this report are preliminary and upon coder review may  be revised to meet current compliance requirements. Barney Drain, MD Barney Drain MD, MD 11/05/2017 12:39:50 PM This report has been signed electronically. Number of Addenda: 0

## 2017-11-05 NOTE — Op Note (Signed)
[facility] Patient Name: [name] Procedure Date: 11/05/2017 11:11 AM MRN: [id number] Date of Birth: February 28, 1961 Attending MD: Barney Drain MD, MD CSN: [id number] Age: 57 Admit Type: Outpatient Procedure:                Colonoscopy WITH COLD FORCEPS/SNARE CAUTERY                            POLYPECTOMY Indications:              Clinically significant diarrhea of unexplained                            origin, Hematochezia/BLACK STOOLS -NL Hb(MAR 2019                            14.1) Providers:                Barney Drain MD, MD, Jeanann Lewandowsky. Sharon Seller, RN, Aram Candela Referring MD:             Koleen Distance. Gottschalk Medicines:                Propofol per Anesthesia Complications:            No immediate complications. Estimated Blood Loss:     Estimated blood loss was minimal. Procedure:                Pre-Anesthesia Assessment:                           - Prior to the procedure, a History and Physical                            was performed, and patient medications and                            allergies were reviewed. The patient's tolerance of                            previous anesthesia was also reviewed. The risks                            and benefits of the procedure and the sedation                            options and risks were discussed with the patient.                            All questions were answered, and informed consent                            was obtained. Prior Anticoagulants: The patient has                            taken no previous anticoagulant  or antiplatelet                            agents. ASA Grade Assessment: II - A patient with                            mild systemic disease. After reviewing the risks                            and benefits, the patient was deemed in                            satisfactory condition to undergo the procedure.                            After obtaining informed consent, the  colonoscope                            was passed under direct vision. Throughout the                            procedure, the patient's blood pressure, pulse, and                            oxygen saturations were monitored continuously. The                            [id number] ([id number]) scope was introduced through                            the anus and advanced to the 10 cm into the ileum.                            The colonoscopy was somewhat difficult due to a                            redundant colon. Successful completion of the                            procedure was aided by straightening and shortening                            the scope to obtain bowel loop reduction and                            COLOWRAP. The patient tolerated the procedure                            fairly well. The quality of the bowel preparation                            was excellent. The terminal ileum, ileocecal valve,  appendiceal orifice, and rectum were photographed. Scope In: 11:52:10 AM Scope Out: 12:10:00 PM Scope Withdrawal Time: 0 hours 15 minutes 28 seconds  Total Procedure Duration: 0 hours 17 minutes 50 seconds  Findings:      The terminal ileum appeared normal.      A 6 mm polyp was found in the sigmoid colon. The polyp was sessile. The       polyp was removed with a hot snare. Resection and retrieval were       complete.      The recto-sigmoid colon and sigmoid colon were mildly redundant.       Biopsies for histology were taken with a cold forceps from the cecum,       right colon and left colon for evaluation of microscopic colitis.      External and internal hemorrhoids were found during retroflexion. The       hemorrhoids were small. Impression:               - The examined portion of the ileum was normal.                           - One 6 mm polyp in the sigmoid colon, removed with                            a hot snare. Resected and retrieved.                            - Redundant LEFT colon.                           - INTERMITTENT RECTAL BLEEDING DUE TO SIGMOID COLON                            POLYP and internal hemorrhoids. Moderate Sedation:      Per Anesthesia Care Recommendation:           - Repeat colonoscopy in 5-10 years for surveillance.                           - High fiber diet. CHECK CBC TODAY.                           - Continue present medications.                           - Await pathology results.                           - Return to my office in 4 months.                           - Patient has a contact number available for                            emergencies. The signs and symptoms of potential  delayed complications were discussed with the                            patient. Return to normal activities tomorrow.                            Written discharge instructions were provided to the                            patient. Procedure Code(s):        --- Professional ---                           [phone], Colonoscopy, flexible; with removal of                            tumor(s), polyp(s), or other lesion(s) by snare                            technique                           [id number], 42, Colonoscopy, flexible; with biopsy,                            single or multiple Diagnosis Code(s):        --- Professional ---                           K64.8, Other hemorrhoids                           D12.5, Benign neoplasm of sigmoid colon                           R19.7, Diarrhea, unspecified                           K92.1, Melena (includes Hematochezia)                           Q43.8, Other specified congenital malformations of                            intestine CPT copyright 2017 American [employer] Association. All rights reserved. The codes documented in this report are preliminary and upon coder review may  be revised to meet current compliance requirements. Barney Drain,  MD Barney Drain MD, MD 11/05/2017 12:33:35 PM This report has been signed electronically. Number of Addenda: 0

## 2017-11-06 ENCOUNTER — Other Ambulatory Visit: Payer: Self-pay | Admitting: Family Medicine

## 2017-11-06 ENCOUNTER — Telehealth: Payer: Self-pay

## 2017-11-06 NOTE — Telephone Encounter (Signed)
PLEASE CALL PT. READ HER AVS TO HER. SHE CAN PICK HER AVS AT THE [facility] OR WE CAN MAIL IT TO HER.

## 2017-11-06 NOTE — Telephone Encounter (Signed)
Pt called and said she had procedure yesterday at the hospital. Michela Pitcher she did not receive any paperwork or instructions when she left. Her husband was with her and said he had went out to get the car and he did not receive any paperwork either.

## 2017-11-06 NOTE — Telephone Encounter (Signed)
I read the AVS to pt. I am mailing it to her since she lives out of town.

## 2017-11-07 ENCOUNTER — Telehealth: Payer: Self-pay | Admitting: Gastroenterology

## 2017-11-07 NOTE — Telephone Encounter (Signed)
Please call pt. She had ONE simple adenoma removed. Her STOMACH, SMALL BOWEL, AND small bowel biopsies are normal. HER SYMPTOMS ARE DUE TO GASTRITIS/GERD, AND IBS-D.   DRINK WATER TO KEEP YOUR URINE LIGHT YELLOW.  CONTINUE YOUR WEIGHT LOSS EFFORTS. A WEIGHT OF 170 LBS OR LESS  WILL GET YOUR BODY MASS INDEX(BMI) UNDER 30.  FOLLOW A HIGH FIBER/Ldairy free DIET. AVOID ITEMS THAT CAUSE BLOATING.    CONTINUE PROTONIX. TAKE 30 MINUTES PRIOR TO BREAKFAST.  USE PREPARATION H FOUR TIMES  A DAY IF NEEDED TO RELIEVE RECTAL PAIN/PRESSURE/BLEEDING.   FOLLOW UP IN 4 MOS.   NEXT COLONOSCOPY IN 5-10 YEARS.

## 2017-11-08 NOTE — Telephone Encounter (Signed)
PT called and is aware of the results.

## 2017-11-08 NOTE — Telephone Encounter (Signed)
Called. Many rings and no answer. Mailing a letter to call for results.  

## 2017-11-08 NOTE — Telephone Encounter (Signed)
Reminder in epic °

## 2017-11-11 ENCOUNTER — Encounter (HOSPITAL_COMMUNITY): Payer: Self-pay | Admitting: Gastroenterology

## 2017-11-11 NOTE — Telephone Encounter (Signed)
PATIENT SCHEDULED AND ON RECALL  °

## 2017-11-12 ENCOUNTER — Other Ambulatory Visit: Payer: Self-pay | Admitting: Family Medicine

## 2017-11-14 ENCOUNTER — Other Ambulatory Visit: Payer: Self-pay | Admitting: Family Medicine

## 2017-12-12 ENCOUNTER — Other Ambulatory Visit: Payer: Self-pay | Admitting: *Deleted

## 2017-12-13 MED ORDER — PRAVASTATIN SODIUM 20 MG PO TABS: 20.0000 mg | ORAL_TABLET | Freq: Every day | ORAL | 2 refills | Status: DC

## 2018-01-08 ENCOUNTER — Ambulatory Visit (HOSPITAL_COMMUNITY): Payer: Self-pay | Admitting: Psychiatry

## 2018-01-15 ENCOUNTER — Ambulatory Visit (HOSPITAL_COMMUNITY): Payer: Self-pay | Admitting: Psychiatry

## 2018-01-16 ENCOUNTER — Ambulatory Visit (INDEPENDENT_AMBULATORY_CARE_PROVIDER_SITE_OTHER): Payer: Medicare Other | Admitting: Psychiatry

## 2018-01-16 ENCOUNTER — Encounter (HOSPITAL_COMMUNITY): Payer: Self-pay | Admitting: Psychiatry

## 2018-01-16 VITALS — BP 157/76 | HR 107 | Ht 64.0 in | Wt 178.0 lb

## 2018-01-16 DIAGNOSIS — Z818 Family history of other mental and behavioral disorders: Secondary | ICD-10-CM | POA: Diagnosis not present

## 2018-01-16 DIAGNOSIS — F17210 Nicotine dependence, cigarettes, uncomplicated: Secondary | ICD-10-CM

## 2018-01-16 DIAGNOSIS — F332 Major depressive disorder, recurrent severe without psychotic features: Secondary | ICD-10-CM | POA: Diagnosis not present

## 2018-01-16 DIAGNOSIS — Z813 Family history of other psychoactive substance abuse and dependence: Secondary | ICD-10-CM | POA: Diagnosis not present

## 2018-01-16 DIAGNOSIS — Z810 Family history of intellectual disabilities: Secondary | ICD-10-CM

## 2018-01-16 MED ORDER — DULOXETINE HCL 60 MG PO CPEP: 60.0000 mg | ORAL_CAPSULE | Freq: Two times a day (BID) | ORAL | 2 refills | Status: DC

## 2018-01-16 MED ORDER — TRAZODONE HCL 100 MG PO TABS: 300.0000 mg | ORAL_TABLET | Freq: Every day | ORAL | 2 refills | Status: DC

## 2018-01-16 MED ORDER — DIAZEPAM 10 MG PO TABS: 10.0000 mg | ORAL_TABLET | Freq: Three times a day (TID) | ORAL | 2 refills | Status: DC

## 2018-01-16 MED ORDER — BREXPIPRAZOLE 1 MG PO TABS: 1.0000 mg | ORAL_TABLET | Freq: Every day | ORAL | 2 refills | Status: DC

## 2018-01-16 NOTE — Progress Notes (Signed)
[place] MD/PA/NP OP Progress Note  01/16/2018 2:09 PM [name]  MRN:  [id number]  Chief Complaint:  Chief Complaint    Depression; Anxiety; Follow-up     HPI: this patientis a 57 year old divorced white female who lives alone in [place]. She has one son age 55. She had a daughter who died at age 61 in 08/06/2011 of a narcotic overdose. The patient is on disability. She is self-referred.  The patient states that she began getting depressed in her early 40s. She admits to suicide attempts but was never hospitalized but was treated in outpatient clinics. She's been on numerous medications over the years including Celexa, Abilify, Zoloft, amitriptyline, clonazepam and Xanax. She used to go to [place] and families followed by a clinic in [place] and most recently was prescribed medication by her physician at [facility] family medicine. Her physician left and she no longer has access to psychiatric medications. She was on a combination of Celexa, Abilify and clonazepam. She's been off medication for 6 months.  The patient states that her depression got considerably worse after her daughter died in August 06, 2011. The patient witnessed her daughter's death and did not knowwhat was going on. She watched her get drowsy through the entire day but didn't realize that she was going through an overdose. By the time she called 911 it was too late. The patient blames herself for her daughter's death. She has nightmares and flashbacks about the events of that day and can't stop thinking about that. She has not had any counseling since her daughter died.  Currently she cries all the time and has no energy. She does not eat well. She is frightened by the house and has constant panic attacks. Her sleep is disrupted by nightmares. She does not hear voices but sometimes hears noises. She's been trying to cope by drinking several shots of alcohol every few days. She claims she used to smoke marijuana but hasn't for one month. She  does not use other drugs. She has a boyfriend but is isolating herself from him and does not do much with other friends or family members. She has passive suicidal ideation and "wishes I would die and be with my daughter" but denies any thoughts of self-harm.  The patient returns after 3 months.  She states that she is reconnected with her grandson who is her deceased daughter's child.  He lives in [place].  She has been going to some of his football games.  He is still high Retail buyer.  She still angry with his father for keeping the child from her.  She also states that her own son has little time for her because he still busy at work.  She takes this very hard and becomes upset and tearful.  She finds that she is more depressed lately.  At times she sought a suicide but claims she would never actually do it.  I suggested we add Rexulti for augmentation and she is agreeable and she states that she is sleeping well and her anxiety is under pretty good control Visit Diagnosis:    ICD-10-CM   1. Major depressive disorder, recurrent, severe without psychotic features ([place]) F33.2     Past Psychiatric History: Long history of outpatient treatment  Past Medical History:  Past Medical History:  Diagnosis Date  . Allergy   . Anxiety   . Blood transfusion without reported diagnosis   . Breast cancer ([place])   . Breast cancer ([place]) 04/21/2012   Stage II (T1c  N1 M0) grade 3 triple negative breast cancer, right- sided with 1 of 5 positive nodes, status post FEC in a dose dense fashion for 6 cycles followed by radiation therapy by Dr. Sondra Come for her high-risk disease with her initial date of surgery in February 2008.   [name] Chemotherapy-induced neuropathy ([place]) 06/21/2016  . COPD (chronic obstructive pulmonary disease) ([place])   . Depression   . Emphysema of lung ([place])   . GERD (gastroesophageal reflux disease) 12/18/2012  . Headache   . Hyperlipidemia   . Neuropathy   . Osteoporosis   . PONV  (postoperative nausea and vomiting)   . PTSD (post-traumatic stress disorder)   . Substance abuse [facility])     Past Surgical History:  Procedure Laterality Date  . BIOPSY  11/05/2017   Procedure: BIOPSY;  Surgeon: Danie Binder, MD;  Location[facility] END[facility];  Service: Endoscopy;;  colon duodenum gastric  . BREAST SURGERY    . CATARACT EXTRACTION W/PHACO Left 08/15/2017   Procedure: CATARACT EXTRACTION WITH PHACOEMULSIFICATION  AND INTRAOCULAR LENS PLACEMENT LEFT EYE CDE=2.68;  Surgeon: Baruch Goldmann, MD;  Location: [facility] ORS;  Service: Ophthalmology;  Laterality: Left;  left  . CATARACT EXTRACTION W/PHACO Right 09/06/2017   Procedure: CATARACT EXTRACTION PHACO AND INTRAOCULAR LENS PLACEMENT (IOC);  Surgeon: Baruch Goldmann, MD;  Location: [facility] ORS;  Service: Ophthalmology;  Laterality: Right;  CDE: 1.61  . COLONOSCOPY WITH PROPOFOL N/A 11/05/2017   Procedure: COLONOSCOPY WITH PROPOFOL;  Surgeon: Danie Binder, MD;  Location: [facility];  Service: Endoscopy;  Laterality: N/A;  12:30pm  . ESOPHAGOGASTRODUODENOSCOPY  03/10/2008   SLF: Normal esophagus without evidence of Barrett, mass, erosion  ulceration, or stricture, small bowel bx negative, gastritis with NO h.pylori  . ESOPHAGOGASTRODUODENOSCOPY (EGD) WITH PROPOFOL N/A 11/05/2017   Procedure: ESOPHAGOGASTRODUODENOSCOPY (EGD) WITH PROPOFOL;  Surgeon: Danie Binder, MD;  [facility];  Service: Endoscopy;  Laterality: N/A;  . leocolonoscopy  03/10/2008   KVQ:[id number] terminal ileum, approximately 10 cm visualized/Normal colon without evidence of polyps, mass, inflammatory changes, diverticula, or AVMs/Normal retroflexed view of the rectum. random colon bx negative.  [name] MASTECTOMY     bilateral, status post reconstruction.  [name] POLYPECTOMY  11/05/2017   Procedure: POLYPECTOMY;  Surgeon: Danie Binder, MD;  Location[facility];  Service: Endoscopy;;  colon  . TUBAL LIGATION      Family Psychiatric History: See below  Family History:   Family History  Problem Relation Age of Onset  . COPD Mother   . Depression Mother   . Heart disease Mother   . Hyperlipidemia Mother   . Hypertension Mother   . Cancer Mother        lung cancer, to bone  . Cancer Father        skin  . Heart disease Father   . Hypertension Father   . Hyperlipidemia Father   . Learning disabilities Father   . Drug abuse Brother        overdose at 43  . Drug abuse Daughter        drug overdose 87  . Cancer Maternal Uncle        lung cancer  . Colon cancer Neg Hx     Social History:  Social History   Socioeconomic History  . Marital status: Divorced    Spouse name: Not on file  . Number of children: 2  . Years of education: 52  . Highest education level: Not on file  Occupational History  .  Occupation: disabled    Comment: neuropathy from [place]  . Financial resource strain: Not on file  . Food insecurity:    Worry: Not on file    Inability: Not on file  . Transportation needs:    Medical: Not on file    Non-medical: Not on file  Tobacco Use  . Smoking status: Current Every Day Smoker    Packs/day: 0.50    Years: 20.00    Pack years: 10.00    Types: Cigarettes    Start date: 05/07/1992  . Smokeless tobacco: Never Used  . Tobacco comment: discussed  Substance and Sexual Activity  . Alcohol use: Yes    Alcohol/week: 6.0 standard drinks    Types: 6 Shots of liquor per week    Comment: occ  . Drug use: No  . Sexual activity: Yes    Birth control/protection: Post-menopausal  Lifestyle  . Physical activity:    Days per week: Not on file    Minutes per session: Not on file  . Stress: Not on file  Relationships  . Social connections:    Talks on phone: Not on file    Gets together: Not on file    Attends religious service: Not on file    Active member of club or organization: Not on file    Attends meetings of clubs or organizations: Not on file    Relationship status: Not on file  Other Topics Concern  .  Not on file  Social History Narrative   Divorced   Lives alone   Spends time with boyfriend - he is a smoker    Allergies:  Allergies  Allergen Reactions  . Vicodin [Hydrocodone-Acetaminophen] Nausea Only    Metabolic Disorder Labs: No results found for: HGBA1C, MPG No results found for: PROLACTIN Lab Results  Component Value Date   CHOL 177 07/08/2017   TRIG 120 07/08/2017   HDL 52 07/08/2017   CHOLHDL 3.4 07/08/2017   VLDL 42 (H) 11/23/2016   LDLCALC 103 (H) 07/08/2017   LDLCALC 120 (H) 11/23/2016   Lab Results  Component Value Date   TSH 2.26 11/29/2015   TSH 1.197 03/28/2010    Therapeutic Level Labs: No results found for: LITHIUM No results found for: VALPROATE No components found for:  CBMZ  Current Medications: Current Outpatient Medications  Medication Sig Dispense Refill  . albuterol (PROVENTIL HFA;VENTOLIN HFA) 108 (90 Base) MCG/ACT inhaler Inhale 2 puffs into the lungs every 6 (six) hours as needed for wheezing or shortness of breath. 1 Inhaler 5  . cholecalciferol (VITAMIN D) 1000 units tablet Take 1,000 Units by mouth daily.    . clotrimazole-betamethasone (LOTRISONE) cream Apply 1 application topically 2 (two) times daily. To corner of mouth 30 g 0  . Crisaborole (EUCRISA) 2 % OINT Apply 1 g topically daily. Apply to affected areas on face daily for eczema. 60 g 1  . diazepam (VALIUM) 10 MG tablet Take 1 tablet (10 mg total) by mouth 3 (three) times daily. 90 tablet 2  . DULoxetine (CYMBALTA) 60 MG capsule Take 1 capsule (60 mg total) by mouth 2 (two) times daily. 180 capsule 2  . fexofenadine (ALLEGRA) 180 MG tablet Take 180 mg by mouth daily as needed for allergies or rhinitis.    . fluticasone-salmeterol (ADVAIR HFA) 115-21 MCG/ACT inhaler Inhale 2 puffs into the lungs 2 (two) times daily. (Patient taking differently: Inhale 2 puffs into the lungs 2 (two) times daily as needed (shortness of breath). )  1 Inhaler 5  . pantoprazole (PROTONIX) 40 MG tablet  Take 1 tablet (40 mg total) by mouth daily before breakfast. 30 tablet 11  . pravastatin (PRAVACHOL) 20 MG tablet Take 1 tablet (20 mg total) by mouth at bedtime. 90 tablet 2  . traZODone (DESYREL) 100 MG tablet Take 3 tablets (300 mg total) by mouth at bedtime. 90 tablet 2  . triamcinolone cream (KENALOG) 0.1 % Apply 1 application topically 2 (two) times daily. Until rash clears 30 g 1  . vitamin C (ASCORBIC ACID) 500 MG tablet Take 500 mg by mouth daily.    . Brexpiprazole (REXULTI) 1 MG TABS Take 1 tablet (1 mg total) by mouth daily. 30 tablet 2   No current facility-administered medications for this visit.      Musculoskeletal: Strength & Muscle Tone: within normal limits Gait & Station: normal Patient leans: N/A  Psychiatric Specialty Exam: Review of Systems  Psychiatric/Behavioral: Positive for depression.  All other systems reviewed and are negative.   Blood pressure (!) 157/76, pulse (!) 107, height 5\' 4"  (1.626 m), weight 178 lb (80.7 kg), last menstrual period 07/09/2013, SpO2 93 %.Body mass index is 30.55 kg/m.  General Appearance: Casual and Fairly Groomed  Eye Contact:  Good  Speech:  Clear and Coherent  Volume:  Normal  Mood:  Depressed  Affect:  Constricted  Thought Process:  Goal Directed  Orientation:  Full (Time, Place, and Person)  Thought Content: Rumination   Suicidal Thoughts:  No  Homicidal Thoughts:  No  Memory:  Immediate;   Good Recent;   Good Remote;   Fair  Judgement:  Fair  Insight:  Lacking  Psychomotor Activity:  Normal  Concentration:  Concentration: Good and Attention Span: Good  Recall:  Good  Fund of Knowledge: Good  Language: Good  Akathisia:  No  Handed:  Right  AIMS (if indicated): not done  Assets:  Communication Skills Desire for Improvement Resilience Social Support Talents/Skills  ADL's:  Intact  Cognition: WNL  Sleep:  Good   Screenings: PHQ2-9     Office Visit from 09/02/2017 in [facility]  Office Visit from 11/23/2016 in [facility] Office Visit from 06/21/2016 in [facility]  PHQ-2 Total Score  4  0  4  PHQ-9 Total Score  9  -  14       Assessment and Plan: This patient is a 57 year old female with a history of depression and anxiety.  She has been more depressed recently and is clear that she is not always taking her Cymbalta twice a day.  She promises that she will work on this.  She is supposed to take 60 mg twice daily.  She will continue trazodone 300 mg at bedtime for sleep and Valium 10 mg 3 times daily for anxiety.  She has been given a starter pack of Rexulti.  She will start on 0.5 mg for a week and then advance to 1 mg daily.  She will return to see me in 6 weeks   [name], MD 01/16/2018, 2:09 PM

## 2018-01-23 ENCOUNTER — Telehealth: Payer: Self-pay | Admitting: Family Medicine

## 2018-01-24 IMAGING — DX DG CHEST 2V
2 series · 2 of 2 positions shown · non-contrast
Comparison: Radiographs May 27, 2015.

CLINICAL DATA: Shortness of breath.

EXAM:
CHEST  2 VIEW

[chest pa]
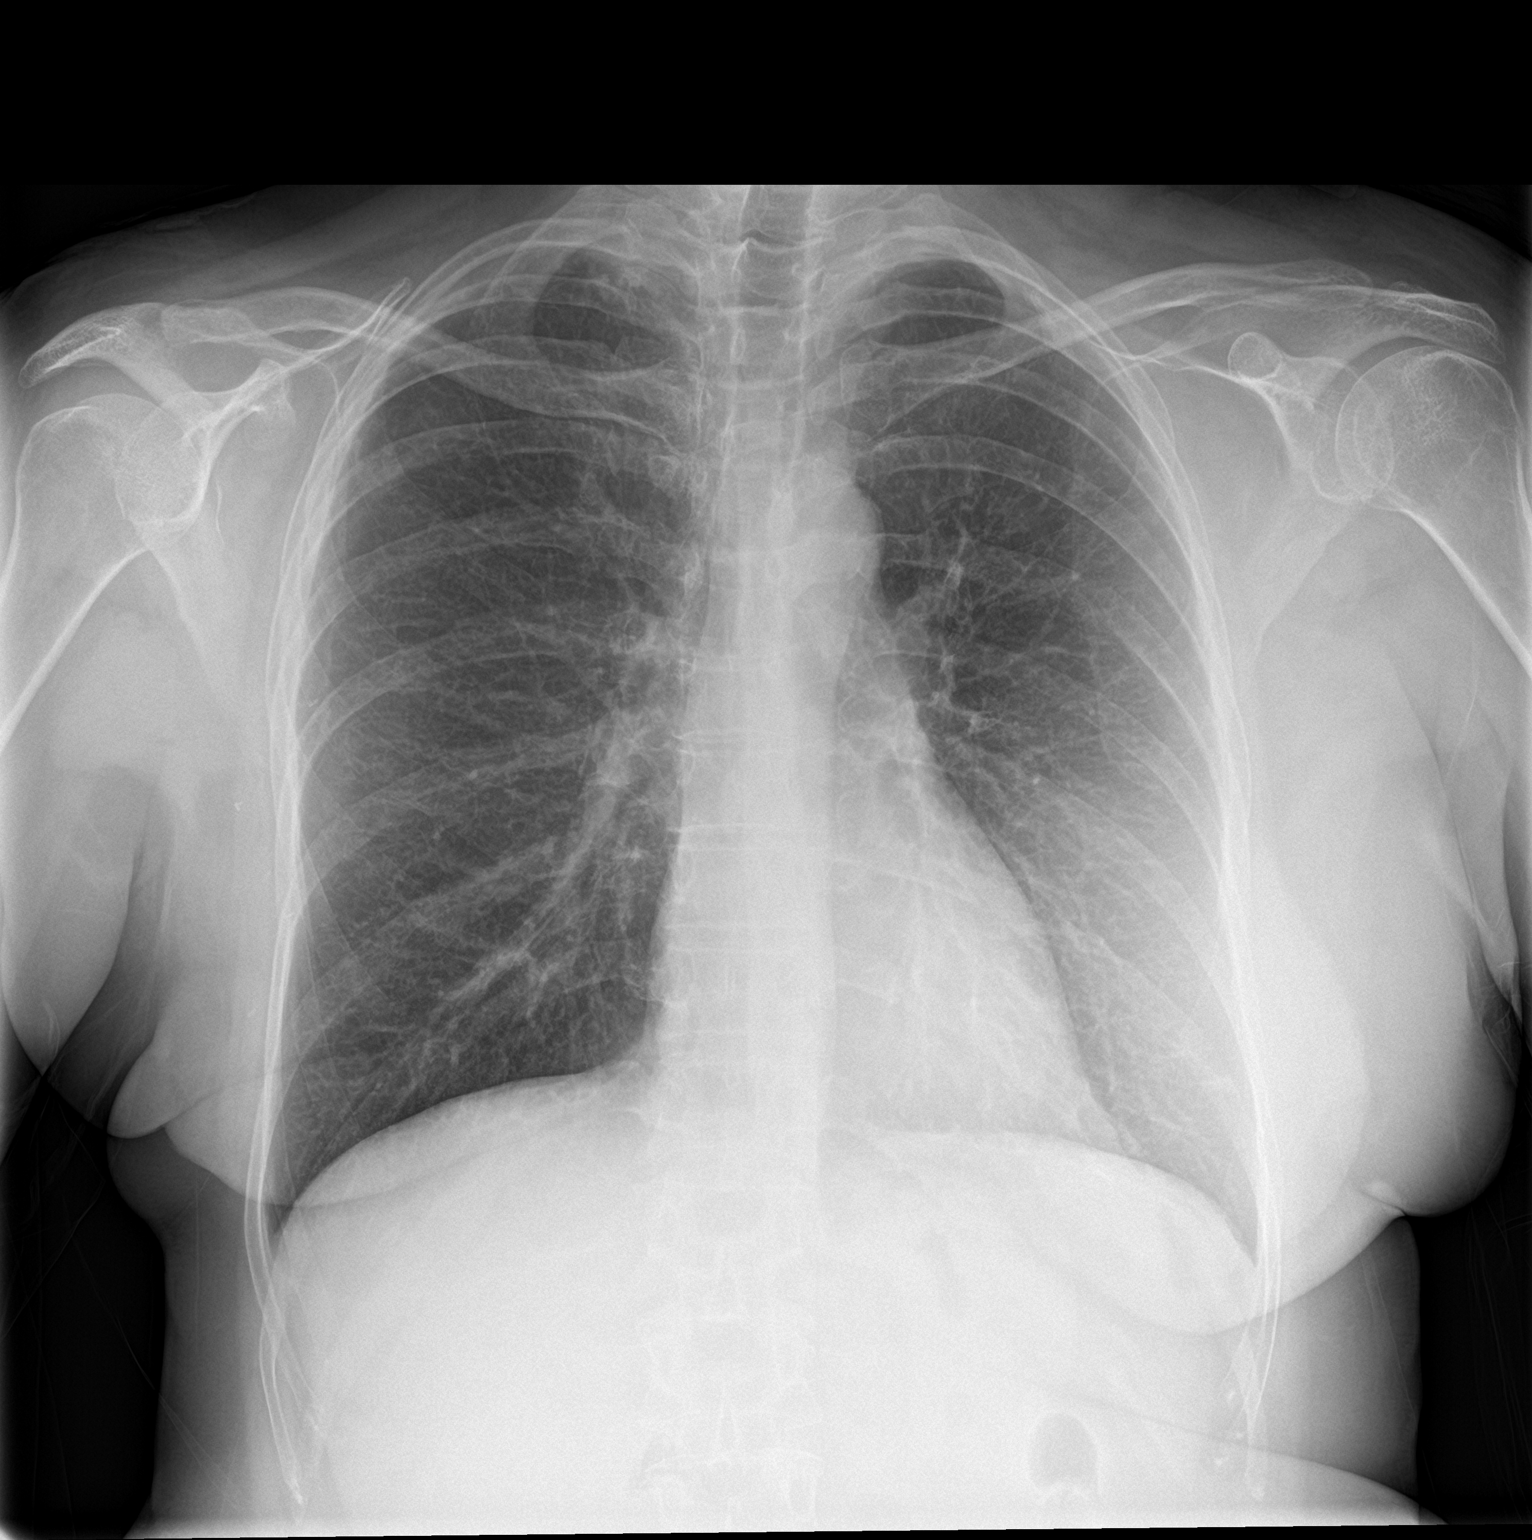

[chest lat]
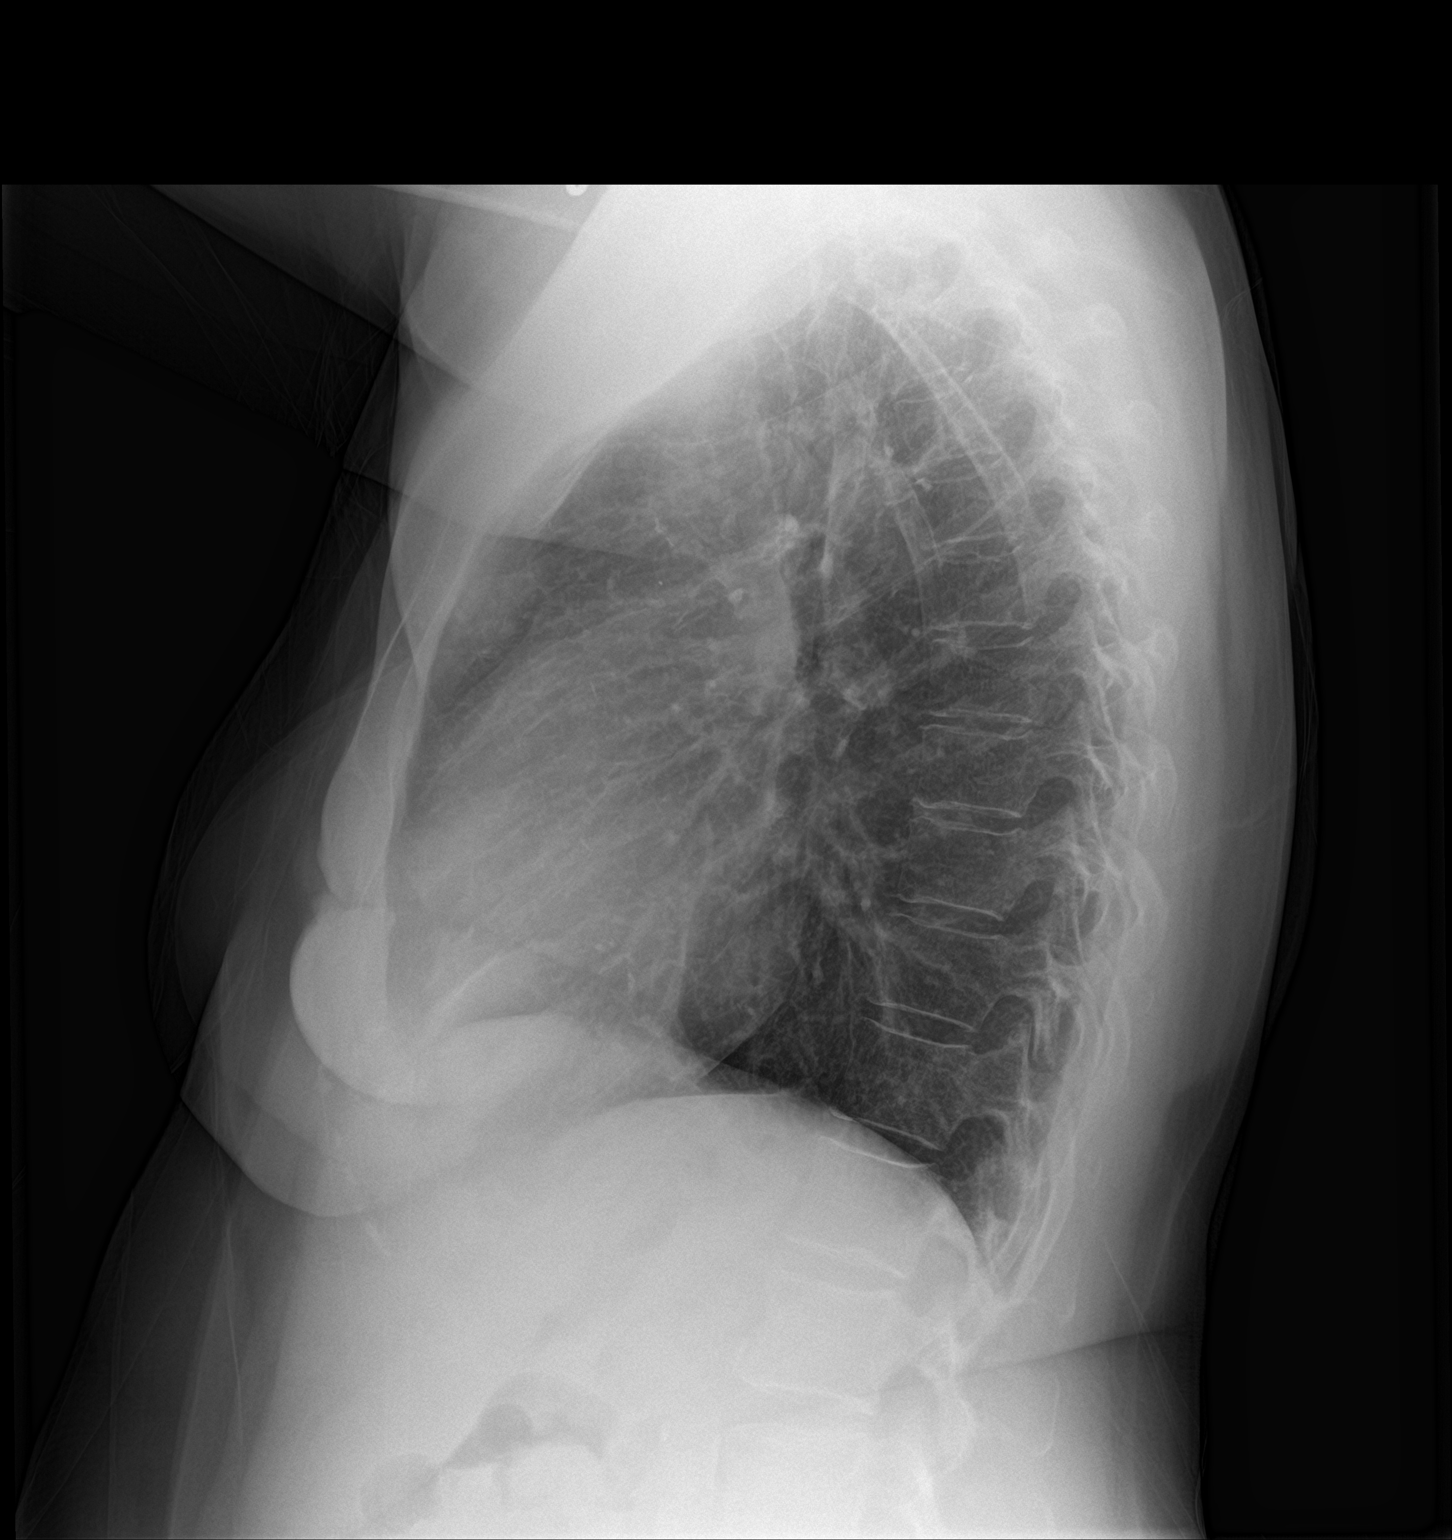

[2 of 2 positions shown; findings below may reference images not displayed]

FINDINGS: The heart size and mediastinal contours are within normal limits.
Both lungs are clear. No pneumothorax or pleural effusion is noted.
The visualized skeletal structures are unremarkable.
IMPRESSION: No active cardiopulmonary disease.

## 2018-01-28 ENCOUNTER — Ambulatory Visit: Payer: Medicare Other | Admitting: *Deleted

## 2018-02-20 ENCOUNTER — Ambulatory Visit: Payer: Medicare Other | Admitting: *Deleted

## 2018-02-27 ENCOUNTER — Ambulatory Visit (HOSPITAL_COMMUNITY): Payer: Self-pay | Admitting: Psychiatry

## 2018-03-06 ENCOUNTER — Other Ambulatory Visit (HOSPITAL_COMMUNITY): Payer: Self-pay | Admitting: Psychiatry

## 2018-03-09 NOTE — Progress Notes (Signed)
Referring Provider: [name], DO Primary Care Physician:  [name], DO Primary GI:  Dr. Oneida Alar  Chief Complaint  Patient presents with  . Diarrhea    sometimes thinks she has gas but has bm on herself    HPI:   [name] is a 57 y.o. female who presents for 42-month follow-up after colonoscopy and upper endoscopy.  Procedures were completed 11/05/2017.  The colonoscopy found a 6 mm polyp in the sigmoid colon which was sessile and removed.  Rectosigmoid colon and sigmoid colon biopsies taken for evaluation for microscopic colitis.  Intermittent rectal bleeding due to sigmoid colon polyp and internal hemorrhoids.  EGD completed the same day found mild gastritis/duodenitis status post biopsy.  Overall it was felt diarrhea most likely due to dairy intake and IBS.  Surgical pathology found the polyp to be tubular adenoma, the colon biopsies to be normal colonic mucosa without histopathological changes, and the gastric biopsies to be gastric antral and oxyntic mucosa without specific histopathological changes.  Recommended continue weight loss efforts, high-fiber/dairy free diet, continue Protonix, use Preparation H up to 4 times a day if needed, follow-up in 4 months, repeat colonoscopy in 5 to 10 years.  Today she states she's doing ok overall. She has stopped eating milk and iced cream. Still has about 1/2 cup milk in coffee and cheese. Still has a bowel movement 1-2 times daily which are variable from [facility] 4 to 6; Has diarrhea about 4 stools a week. Has gas and sometimes will have an "accidental" bowel movement/loose stool on herself at that time. Rare hematochezia related to hemorrhoids and uses Tucks pads which she feels helps. Denies melena, abdominal pain (other than occasional mild gas pain), N/V, fever, chills, unintentional weight loss. Has breakthrough GERD symptoms and was on Pirilosec by PCP and was changed to another medication that she feels isn't working well  and has an appointment to follow-up with PCP. Has COPD but breathing is at baseline currently. Denies chest pain, dizziness, lightheadedness, syncope, near syncope. Denies any other upper or lower GI symptoms.  Past Medical History:  Diagnosis Date  . Allergy   . Anxiety   . Blood transfusion without reported diagnosis   . Breast cancer ([place])   . Breast cancer ([place]) 04/21/2012   Stage II (T1c N1 M0) grade 3 triple negative breast cancer, right- sided with 1 of 5 positive nodes, status post FEC in a dose dense fashion for 6 cycles followed by radiation therapy by Dr. Sondra Come for her high-risk disease with her initial date of surgery in February 2008.   [name] Chemotherapy-induced neuropathy ([place]) 06/21/2016  . COPD (chronic obstructive pulmonary disease) ([place])   . Depression   . Emphysema of lung ([place])   . GERD (gastroesophageal reflux disease) 12/18/2012  . Headache   . Hyperlipidemia   . Neuropathy   . Osteoporosis   . PONV (postoperative nausea and vomiting)   . PTSD (post-traumatic stress disorder)   . Substance abuse [facility])     Past Surgical History:  Procedure Laterality Date  . BIOPSY  11/05/2017   Procedure: BIOPSY;  Surgeon: Danie Binder, MD;  [facility];  Service: Endoscopy;;  colon duodenum gastric  . BREAST SURGERY    . CATARACT EXTRACTION W/PHACO Left 08/15/2017   Procedure: CATARACT EXTRACTION WITH PHACOEMULSIFICATION  AND INTRAOCULAR LENS PLACEMENT LEFT EYE CDE=2.68;  Surgeon: Baruch Goldmann, MD;  Location: [facility] ORS;  Service: Ophthalmology;  Laterality: Left;  left  .  CATARACT EXTRACTION W/PHACO Right 09/06/2017   Procedure: CATARACT EXTRACTION PHACO AND INTRAOCULAR LENS PLACEMENT (IOC);  Surgeon: Baruch Goldmann, MD;  Location: [facility] OR[facility];  Service: Ophthalmology;  Laterality: Right;  CDE: 1.61  . COLONOSCOPY WITH PROPOFOL N/A 11/05/2017   Procedure: COLONOSCOPY WITH PROPOFOL;  Surgeon: Danie Binder, MD;  Location[facility];  Service: Endoscopy;  Laterality: N/A;   12:30pm  . ESOPHAGOGASTRODUODENOSCOPY  03/10/2008   SLF: Normal esophagus without evidence of Barrett, mass, erosion  ulceration, or stricture, small bowel bx negative, gastritis with NO h.pylori  . ESOPHAGOGASTRODUODENOSCOPY (EGD) WITH PROPOFOL N/A 11/05/2017   Procedure: ESOPHAGOGASTRODUODENOSCOPY (EGD) WITH PROPOFOL;  Surgeon: Danie Binder, MD;  Location[facility];  Service: Endoscopy;  Laterality: N/A;  . leocolonoscopy  03/10/2008   ZHG:[id number] terminal ileum, approximately 10 cm visualized/Normal colon without evidence of polyps, mass, inflammatory changes, diverticula, or AVMs/Normal retroflexed view of the rectum. random colon bx negative.  [name] MASTECTOMY     bilateral, status post reconstruction.  [name] POLYPECTOMY  11/05/2017   Procedure: POLYPECTOMY;  Surgeon: Danie Binder, MD;  [facility];  Service: Endoscopy;;  colon  . TUBAL LIGATION      Current Outpatient Medications  Medication Sig Dispense Refill  . albuterol (PROVENTIL HFA;VENTOLIN HFA) 108 (90 Base) MCG/ACT inhaler Inhale 2 puffs into the lungs every 6 (six) hours as needed for wheezing or shortness of breath. 1 Inhaler 5  . cholecalciferol (VITAMIN D) 1000 units tablet Take 1,000 Units by mouth daily.    . fluticasone-salmeterol (ADVAIR HFA) 115-21 MCG/ACT inhaler Inhale 2 puffs into the lungs 2 (two) times daily. (Patient taking differently: Inhale 2 puffs into the lungs 2 (two) times daily as needed (shortness of breath). ) 1 Inhaler 5  . Loperamide HCl (ANTI-DIARRHEAL PO) Take by mouth as needed.    . pravastatin (PRAVACHOL) 20 MG tablet Take 1 tablet (20 mg total) by mouth at bedtime. 90 tablet 2  . vitamin C (ASCORBIC ACID) 500 MG tablet Take 500 mg by mouth daily.    . Brexpiprazole (REXULTI) 1 MG TABS Take 1 tablet (1 mg total) by mouth daily. 30 tablet 2  . clotrimazole-betamethasone (LOTRISONE) cream Apply 1 application topically 2 (two) times daily. To corner of mouth 30 g 0  . Crisaborole (EUCRISA)  2 % OINT Apply 1 g topically daily. Apply to affected areas on face daily for eczema. 60 g 1  . diazepam (VALIUM) 10 MG tablet Take 1 tablet (10 mg total) by mouth 3 (three) times daily. 90 tablet 2  . DULoxetine (CYMBALTA) 60 MG capsule Take 1 capsule (60 mg total) by mouth 2 (two) times daily. 180 capsule 2  . fexofenadine (ALLEGRA) 180 MG tablet Take 1 tablet (180 mg total) by mouth daily as needed for allergies or rhinitis. 90 tablet 3  . omeprazole (PRILOSEC) 20 MG capsule Take 1 capsule (20 mg total) by mouth daily. 90 capsule 3  . prazosin (MINIPRESS) 2 MG capsule Take 1 capsule (2 mg total) by mouth at bedtime. 30 capsule 2  . traZODone (DESYREL) 100 MG tablet Take 3 tablets (300 mg total) by mouth at bedtime. 90 tablet 2  . triamcinolone cream (KENALOG) 0.1 % Apply 1 application topically 2 (two) times daily. Until rash clears 30 g 1  . varenicline (CHANTIX CONTINUING MONTH PAK) 1 MG tablet Take 1 tablet (1 mg total) by mouth 2 (two) times daily. 60 tablet 2  . varenicline (CHANTIX STARTING MONTH PAK) 0.5 MG X  11 & 1 MG X 42 tablet Take 0.5 mg tablet by mouth once daily x3 days, then 0.5 mg tablet twice daily x4 days, then increase to one 1 mg tablet twice daily. 53 tablet 0   No current facility-administered medications for this visit.     Allergies as of 03/10/2018 - Review Complete 03/10/2018  Allergen Reaction Noted  . Vicodin [hydrocodone-acetaminophen] Nausea Only 07/15/2013    Family History  Problem Relation Age of Onset  . COPD Mother   . Depression Mother   . Heart disease Mother   . Hyperlipidemia Mother   . Hypertension Mother   . Cancer Mother        lung cancer, to bone  . Cancer Father        skin  . Heart disease Father   . Hypertension Father   . Hyperlipidemia Father   . Learning disabilities Father   . Drug abuse Brother        overdose at 24  . Drug abuse Daughter        drug overdose 34  . Cancer Maternal Uncle        lung cancer  . Colon cancer  Neg Hx     Social History   Socioeconomic History  . Marital status: Divorced    Spouse name: Not on file  . Number of children: 2  . Years of education: 34  . Highest education level: Not on file  Occupational History  . Occupation: disabled    Comment: neuropathy from [place]  . Financial resource strain: Not on file  . Food insecurity:    Worry: Not on file    Inability: Not on file  . Transportation needs:    Medical: Not on file    Non-medical: Not on file  Tobacco Use  . Smoking status: Current Every Day Smoker    Packs/day: 0.50    Years: 20.00    Pack years: 10.00    Types: Cigarettes    Start date: 05/07/1992  . Smokeless tobacco: Never Used  . Tobacco comment: discussed  Substance and Sexual Activity  . Alcohol use: Yes    Alcohol/week: 6.0 standard drinks    Types: 6 Shots of liquor per week    Comment: occ  . Drug use: No  . Sexual activity: Yes    Birth control/protection: Post-menopausal  Lifestyle  . Physical activity:    Days per week: Not on file    Minutes per session: Not on file  . Stress: Not on file  Relationships  . Social connections:    Talks on phone: Not on file    Gets together: Not on file    Attends religious service: Not on file    Active member of club or organization: Not on file    Attends meetings of clubs or organizations: Not on file    Relationship status: Not on file  Other Topics Concern  . Not on file  Social History Narrative   Divorced   Lives alone   Spends time with boyfriend - he is a smoker    Review of Systems: Complete ROS negative except as per HPI.   Physical Exam: BP 109/70   Pulse 95   Temp (!) 97.3 F (36.3 C) (Oral)   Ht 5\' 4"  (1.626 m)   Wt 182 lb 12.8 oz (82.9 kg)   LMP 07/09/2013   BMI 31.38 kg/m  General:   Alert and oriented. Pleasant and cooperative. Well-nourished  and well-developed.  Eyes:  Without icterus, sclera clear and conjunctiva pink.  Ears:  Normal auditory  acuity. Cardiovascular:  S1, S2 present without murmurs appreciated. Extremities without clubbing or edema. Respiratory:  Clear to auscultation bilaterally. No wheezes, rales, or rhonchi. No distress.  Gastrointestinal:  +BS, soft, non-tender and non-distended. No HSM noted. No guarding or rebound. No masses appreciated.  Rectal:  Deferred  Musculoskalatal:  Symmetrical without gross deformities. Neurologic:  Alert and oriented x4;  grossly normal neurologically. Psych:  Alert and cooperative. Normal mood and affect. Heme/Lymph/Immune: No excessive bruising noted.    03/17/2018 12:31 PM   Disclaimer: This note was dictated with voice recognition software. Similar sounding words can inadvertently be transcribed and may not be corrected upon review.

## 2018-03-10 ENCOUNTER — Encounter: Payer: Self-pay | Admitting: Nurse Practitioner

## 2018-03-10 ENCOUNTER — Ambulatory Visit (INDEPENDENT_AMBULATORY_CARE_PROVIDER_SITE_OTHER): Payer: Medicare Other | Admitting: Psychiatry

## 2018-03-10 ENCOUNTER — Ambulatory Visit (INDEPENDENT_AMBULATORY_CARE_PROVIDER_SITE_OTHER): Payer: Medicare Other | Admitting: Nurse Practitioner

## 2018-03-10 ENCOUNTER — Encounter (HOSPITAL_COMMUNITY): Payer: Self-pay | Admitting: Psychiatry

## 2018-03-10 VITALS — BP 109/70 | HR 95 | Temp 97.3°F | Ht 64.0 in | Wt 182.8 lb

## 2018-03-10 VITALS — BP 119/84 | HR 94 | Ht 64.0 in | Wt 183.0 lb

## 2018-03-10 DIAGNOSIS — K625 Hemorrhage of anus and rectum: Secondary | ICD-10-CM

## 2018-03-10 DIAGNOSIS — F332 Major depressive disorder, recurrent severe without psychotic features: Secondary | ICD-10-CM | POA: Diagnosis not present

## 2018-03-10 DIAGNOSIS — R197 Diarrhea, unspecified: Secondary | ICD-10-CM

## 2018-03-10 DIAGNOSIS — K219 Gastro-esophageal reflux disease without esophagitis: Secondary | ICD-10-CM | POA: Diagnosis not present

## 2018-03-10 MED ORDER — TRAZODONE HCL 100 MG PO TABS: 300.0000 mg | ORAL_TABLET | Freq: Every day | ORAL | 2 refills | Status: DC

## 2018-03-10 MED ORDER — DIAZEPAM 10 MG PO TABS: 10.0000 mg | ORAL_TABLET | Freq: Three times a day (TID) | ORAL | 2 refills | Status: DC

## 2018-03-10 MED ORDER — DULOXETINE HCL 60 MG PO CPEP: 60.0000 mg | ORAL_CAPSULE | Freq: Two times a day (BID) | ORAL | 2 refills | Status: DC

## 2018-03-10 MED ORDER — PRAZOSIN HCL 2 MG PO CAPS: 2.0000 mg | ORAL_CAPSULE | Freq: Every day | ORAL | 2 refills | Status: DC

## 2018-03-10 MED ORDER — BREXPIPRAZOLE 1 MG PO TABS: 1.0000 mg | ORAL_TABLET | Freq: Every day | ORAL | 2 refills | Status: DC

## 2018-03-10 NOTE — Progress Notes (Signed)
[name]/PA/NP OP Progress Note  03/10/2018 11:08 AM [name]  MRN:  [id number]  Chief Complaint:  Chief Complaint    Depression; Anxiety; Follow-up     HPI: this patientis a 57 year old divorced white female who lives with her boyfriend in [place]. She has one son age 74. She had a daughter who died at age 45 in 08/30/2011 of a narcotic overdose. The patient is on disability. She is self-referred.  The patient states that she began getting depressed in her early 3s. She admits to suicide attempts but was never hospitalized but was treated in outpatient clinics. She's been on numerous medications over the years including Celexa, Abilify, Zoloft, amitriptyline, clonazepam and Xanax. She used to go to [place] and families followed by a clinic in [place] and most recently was prescribed medication by her physician at [facility]. Her physician left and she no longer has access to psychiatric medications. She was on a combination of Celexa, Abilify and clonazepam. She's been off medication for 6 months.  The patient states that her depression got considerably worse after her daughter died in 30-Aug-2011. The patient witnessed her daughter's death and did not knowwhat was going on. She watched her get drowsy through the entire day but didn't realize that she was going through an overdose. By the time she called 911 it was too late. The patient blames herself for her daughter's death. She has nightmares and flashbacks about the events of that day and can't stop thinking about that. She has not had any counseling since her daughter died.  Currently she cries all the time and has no energy. She does not eat well. She is frightened by the house and has constant panic attacks. Her sleep is disrupted by nightmares. She does not hear voices but sometimes hears noises. She's been trying to cope by drinking several shots of alcohol every few days. She claims she used to smoke marijuana but hasn't for  one month. She does not use other drugs. She has a boyfriend but is isolating herself from him and does not do much with other friends or family members. She has passive suicidal ideation and "wishes I would die and be with my daughter" but denies any thoughts of self-harm.  The patient returns for follow-up after 2 months.  Last time we added Rexulti to her regimen and she states that it really helped.  She is less depressed.  She is very worried about her boyfriend was to have neck surgery this week.  She states that she is not sleeping well is constantly being awakened by nightmares.  She has nightmares in which she has to defend herself and feels frightened and paranoid.  She states in real life she feels very safe so she is not sure why this is happening.  I suggested that we add prazosin to her regimen at bedtime along with the trazodone to see if it will help the nightmares.  She states that her mood is generally pretty good her energy is good and her anxiety is under good control. Visit Diagnosis:    ICD-10-CM   1. Major depressive disorder, recurrent, severe without psychotic features ([place]) F33.2     Past Psychiatric History: Long-term history of outpatient treatment  Past Medical History:  Past Medical History:  Diagnosis Date  . Allergy   . Anxiety   . Blood transfusion without reported diagnosis   . Breast cancer ([place])   . Breast cancer ([place]) 04/21/2012   Stage II (  T1c N1 M0) grade 3 triple negative breast cancer, right- sided with 1 of 5 positive nodes, status post FEC in a dose dense fashion for 6 cycles followed by radiation therapy by Dr. Sondra Come for her high-risk disease with her initial date of surgery in February 2008.   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 COPD (chronic obstructive pulmonary disease) ([place])   . Depression   . Emphysema of lung ([place])   . GERD (gastroesophageal reflux disease) 12/18/2012  . Headache   . Hyperlipidemia   . Neuropathy   .  Osteoporosis   . PONV (postoperative nausea and vomiting)   . PTSD (post-traumatic stress disorder)   . Substance abuse [facility])     Past Surgical History:  Procedure Laterality Date  . BIOPSY  11/05/2017   Procedure: BIOPSY;  Surgeon: Danie Binder, [name];  Location[facility];  Service: Endoscopy;;  colon duodenum gastric  . BREAST SURGERY    . CATARACT EXTRACTION W/PHACO Left 08/15/2017   Procedure: CATARACT EXTRACTION WITH PHACOEMULSIFICATION  AND INTRAOCULAR LENS PLACEMENT LEFT EYE CDE=2.68;  Surgeon: Baruch Goldmann, [name];  Location: [facility] ORS;  Service: Ophthalmology;  Laterality: Left;  left  . CATARACT EXTRACTION W/PHACO Right 09/06/2017   Procedure: CATARACT EXTRACTION PHACO AND INTRAOCULAR LENS PLACEMENT (IOC);  Surgeon: Baruch Goldmann, [name];  Location: [facility];  Service: Ophthalmology;  Laterality: Right;  CDE: 1.61  . COLONOSCOPY WITH PROPOFOL N/A 11/05/2017   Procedure: COLONOSCOPY WITH PROPOFOL;  Surgeon: Danie Binder, [name];  Location: [facility];  Service: Endoscopy;  Laterality: N/A;  12:30pm  . ESOPHAGOGASTRODUODENOSCOPY  03/10/2008   SLF: Normal esophagus without evidence of Barrett, mass, erosion  ulceration, or stricture, small bowel bx negative, gastritis with NO h.pylori  . ESOPHAGOGASTRODUODENOSCOPY (EGD) WITH PROPOFOL N/A 11/05/2017   Procedure: ESOPHAGOGASTRODUODENOSCOPY (EGD) WITH PROPOFOL;  Surgeon: Danie Binder, [name];  [facility];  Service: Endoscopy;  Laterality: N/A;  . leocolonoscopy  03/10/2008   UUV:OZDGUY terminal ileum, approximately 10 cm visualized/Normal colon without evidence of polyps, mass, inflammatory changes, diverticula, or AVMs/Normal retroflexed view of the rectum. random colon bx negative.  [name] MASTECTOMY     bilateral, status post reconstruction.  [name] POLYPECTOMY  11/05/2017   Procedure: POLYPECTOMY;  Surgeon: Danie Binder, [name];  [facility];  Service: Endoscopy;;  colon  . TUBAL LIGATION      Family Psychiatric History: See  below  Family History:  Family History  Problem Relation Age of Onset  . COPD Mother   . Depression Mother   . Heart disease Mother   . Hyperlipidemia Mother   . Hypertension Mother   . Cancer Mother        lung cancer, to bone  . Cancer Father        skin  . Heart disease Father   . Hypertension Father   . Hyperlipidemia Father   . Learning disabilities Father   . Drug abuse Brother        overdose at 79  . Drug abuse Daughter        drug overdose 7  . Cancer Maternal Uncle        lung cancer  . Colon cancer Neg Hx     Social History:  Social History   Socioeconomic History  . Marital status: Divorced    Spouse name: Not on file  . Number of children: 2  . Years of education: 7  . Highest education level: Not on file  Occupational  History  . Occupation: disabled    Comment: neuropathy from [place]  . Financial resource strain: Not on file  . Food insecurity:    Worry: Not on file    Inability: Not on file  . Transportation needs:    Medical: Not on file    Non-medical: Not on file  Tobacco Use  . Smoking status: Current Every Day Smoker    Packs/day: 0.50    Years: 20.00    Pack years: 10.00    Types: Cigarettes    Start date: 05/07/1992  . Smokeless tobacco: Never Used  . Tobacco comment: discussed  Substance and Sexual Activity  . Alcohol use: Yes    Alcohol/week: 6.0 standard drinks    Types: 6 Shots of liquor per week    Comment: occ  . Drug use: No  . Sexual activity: Yes    Birth control/protection: Post-menopausal  Lifestyle  . Physical activity:    Days per week: Not on file    Minutes per session: Not on file  . Stress: Not on file  Relationships  . Social connections:    Talks on phone: Not on file    Gets together: Not on file    Attends religious service: Not on file    Active member of club or organization: Not on file    Attends meetings of clubs or organizations: Not on file    Relationship status: Not on file   Other Topics Concern  . Not on file  Social History Narrative   Divorced   Lives alone   Spends time with boyfriend - he is a smoker    Allergies:  Allergies  Allergen Reactions  . Vicodin [Hydrocodone-Acetaminophen] Nausea Only    Metabolic Disorder Labs: No results found for: HGBA1C, MPG No results found for: PROLACTIN Lab Results  Component Value Date   CHOL 177 07/08/2017   TRIG 120 07/08/2017   HDL 52 07/08/2017   CHOLHDL 3.4 07/08/2017   VLDL 42 (H) 11/23/2016   LDLCALC 103 (H) 07/08/2017   LDLCALC 120 (H) 11/23/2016   Lab Results  Component Value Date   TSH 2.26 11/29/2015   TSH 1.197 03/28/2010    Therapeutic Level Labs: No results found for: LITHIUM No results found for: VALPROATE No components found for:  CBMZ  Current Medications: Current Outpatient Medications  Medication Sig Dispense Refill  . albuterol (PROVENTIL HFA;VENTOLIN HFA) 108 (90 Base) MCG/ACT inhaler Inhale 2 puffs into the lungs every 6 (six) hours as needed for wheezing or shortness of breath. 1 Inhaler 5  . Brexpiprazole (REXULTI) 1 MG TABS Take 1 tablet (1 mg total) by mouth daily. 30 tablet 2  . cholecalciferol (VITAMIN D) 1000 units tablet Take 1,000 Units by mouth daily.    . clotrimazole-betamethasone (LOTRISONE) cream Apply 1 application topically 2 (two) times daily. To corner of mouth 30 g 0  . Crisaborole (EUCRISA) 2 % OINT Apply 1 g topically daily. Apply to affected areas on face daily for eczema. 60 g 1  . diazepam (VALIUM) 10 MG tablet Take 1 tablet (10 mg total) by mouth 3 (three) times daily. 90 tablet 2  . DULoxetine (CYMBALTA) 60 MG capsule Take 1 capsule (60 mg total) by mouth 2 (two) times daily. 180 capsule 2  . fexofenadine (ALLEGRA) 180 MG tablet Take 180 mg by mouth daily as needed for allergies or rhinitis.    . fluticasone-salmeterol (ADVAIR HFA) 115-21 MCG/ACT inhaler Inhale 2 puffs into the lungs 2 (  two) times daily. (Patient taking differently: Inhale 2 puffs  into the lungs 2 (two) times daily as needed (shortness of breath). ) 1 Inhaler 5  . Loperamide HCl (ANTI-DIARRHEAL PO) Take by mouth as needed.    . pantoprazole (PROTONIX) 40 MG tablet Take 1 tablet (40 mg total) by mouth daily before breakfast. 30 tablet 11  . pravastatin (PRAVACHOL) 20 MG tablet Take 1 tablet (20 mg total) by mouth at bedtime. 90 tablet 2  . traZODone (DESYREL) 100 MG tablet Take 3 tablets (300 mg total) by mouth at bedtime. 90 tablet 2  . triamcinolone cream (KENALOG) 0.1 % Apply 1 application topically 2 (two) times daily. Until rash clears 30 g 1  . vitamin C (ASCORBIC ACID) 500 MG tablet Take 500 mg by mouth daily.    . prazosin (MINIPRESS) 2 MG capsule Take 1 capsule (2 mg total) by mouth at bedtime. 30 capsule 2   No current facility-administered medications for this visit.      Musculoskeletal: Strength & Muscle Tone: within normal limits Gait & Station: normal Patient leans: N/A  Psychiatric Specialty Exam: Review of Systems  Psychiatric/Behavioral: The patient has insomnia.   All other systems reviewed and are negative.   Blood pressure 119/84, pulse 94, height 5\' 4"  (1.626 m), weight 183 lb (83 kg), last menstrual period 07/09/2013, SpO2 93 %.Body mass index is 31.41 kg/m.  General Appearance: Casual and Fairly Groomed  Eye Contact:  Good  Speech:  Clear and Coherent  Volume:  Normal  Mood:  Euthymic  Affect:  Congruent  Thought Process:  Goal Directed  Orientation:  Full (Time, Place, and Person)  Thought Content: Rumination   Suicidal Thoughts:  No  Homicidal Thoughts:  No  Memory:  Immediate;   Good Recent;   Good Remote;   Fair  Judgement:  Fair  Insight:  Fair  Psychomotor Activity:  Normal  Concentration:  Concentration: Good and Attention Span: Good  Recall:  Good  Fund of Knowledge: Good  Language: Good  Akathisia:  No  Handed:  Right  AIMS (if indicated): not done  Assets:  Communication Skills Desire for  Improvement Resilience Social Support Talents/Skills  ADL's:  Intact  Cognition: WNL  Sleep:  Poor   Screenings: PHQ2-9     Office Visit from 09/02/2017 in [facility] Office Visit from 11/23/2016 in [facility] Office Visit from 06/21/2016 in [facility]  PHQ-2 Total Score  4  0  4  PHQ-9 Total Score  9  -  14       Assessment and Plan: This patient is a 57 year old female with a history of depression PTSD and anxiety.  Currently she has been having a lot of nightmares.  We will add prazosin 2 mg at bedtime for nightmares.  She will continue trazodone 300 mg at bedtime for sleep.  She will continue Cymbalta 60 mg twice daily for depression, Valium 10 mg 3 times daily for anxiety and Rexulti 1 mg daily for augmentation of her antidepressant.  She will return to see me in 2 months   [name], [name] 03/10/2018, 11:08 AM

## 2018-03-10 NOTE — Patient Instructions (Signed)
1. As we discussed, try to avoid dairy products strictly. 2. There are dairy alternatives such as soy milk, almond milk, nondairy cheese. 3. Use Imodium as needed for diarrhea. 4. Call us if you have any questions or concerns. 5. Return for follow-up in 3 months.  At [facility] we value your feedback. You may receive a survey about your visit today. Please share your experience as we strive to create trusting relationships with our patients to provide genuine, compassionate, quality care.  We appreciate your understanding and patience as we review any laboratory studies, imaging, and other diagnostic tests that are ordered as we care for you. Our office policy is 5 business days for review of these results, and any emergent or urgent results are addressed in a timely manner for your best interest. If you do not hear from our office in 1 week, please contact us.   We also encourage the use of MyChart, which contains your medical information for your review as well. If you are not enrolled in this feature, an access code is on this after visit summary for your convenience. Thank you for allowing [place] to be involved in your care.  It was great to meet you today!  I hope you have a great Fall!!

## 2018-03-12 ENCOUNTER — Ambulatory Visit: Payer: Medicare Other | Admitting: Family Medicine

## 2018-03-14 ENCOUNTER — Encounter: Payer: Self-pay | Admitting: Family Medicine

## 2018-03-14 ENCOUNTER — Ambulatory Visit (INDEPENDENT_AMBULATORY_CARE_PROVIDER_SITE_OTHER): Payer: Medicare Other | Admitting: Family Medicine

## 2018-03-14 ENCOUNTER — Ambulatory Visit (INDEPENDENT_AMBULATORY_CARE_PROVIDER_SITE_OTHER): Payer: Medicare Other

## 2018-03-14 VITALS — BP 115/80 | HR 88 | Temp 98.1°F | Ht 64.0 in | Wt 184.0 lb

## 2018-03-14 DIAGNOSIS — R05 Cough: Secondary | ICD-10-CM

## 2018-03-14 DIAGNOSIS — Z2821 Immunization not carried out because of patient refusal: Secondary | ICD-10-CM

## 2018-03-14 DIAGNOSIS — F17200 Nicotine dependence, unspecified, uncomplicated: Secondary | ICD-10-CM | POA: Diagnosis not present

## 2018-03-14 DIAGNOSIS — M542 Cervicalgia: Secondary | ICD-10-CM

## 2018-03-14 DIAGNOSIS — Z23 Encounter for immunization: Secondary | ICD-10-CM

## 2018-03-14 DIAGNOSIS — R053 Chronic cough: Secondary | ICD-10-CM

## 2018-03-14 DIAGNOSIS — K219 Gastro-esophageal reflux disease without esophagitis: Secondary | ICD-10-CM

## 2018-03-14 DIAGNOSIS — Z716 Tobacco abuse counseling: Secondary | ICD-10-CM | POA: Diagnosis not present

## 2018-03-14 DIAGNOSIS — G8929 Other chronic pain: Secondary | ICD-10-CM

## 2018-03-14 DIAGNOSIS — M5441 Lumbago with sciatica, right side: Secondary | ICD-10-CM

## 2018-03-14 DIAGNOSIS — M5442 Lumbago with sciatica, left side: Secondary | ICD-10-CM

## 2018-03-14 DIAGNOSIS — L309 Dermatitis, unspecified: Secondary | ICD-10-CM

## 2018-03-14 MED ORDER — FEXOFENADINE HCL 180 MG PO TABS: 180.0000 mg | ORAL_TABLET | Freq: Every day | ORAL | 3 refills | Status: DC | PRN

## 2018-03-14 MED ORDER — OMEPRAZOLE 20 MG PO CPDR
20.0000 mg | DELAYED_RELEASE_CAPSULE | Freq: Every day | ORAL | 3 refills | Status: DC
Start: 2018-03-14 — End: 2018-12-24

## 2018-03-14 MED ORDER — TRIAMCINOLONE ACETONIDE 0.1 % EX CREA: 1.0000 "application " | TOPICAL_CREAM | Freq: Two times a day (BID) | CUTANEOUS | 1 refills | Status: DC

## 2018-03-14 MED ORDER — CRISABOROLE 2 % EX OINT: 1.0000 g | TOPICAL_OINTMENT | Freq: Every day | CUTANEOUS | 1 refills | Status: DC

## 2018-03-14 MED ORDER — CLOTRIMAZOLE-BETAMETHASONE 1-0.05 % EX CREA: 1.0000 "application " | TOPICAL_CREAM | Freq: Two times a day (BID) | CUTANEOUS | 0 refills | Status: DC

## 2018-03-14 MED ORDER — VARENICLINE TARTRATE 1 MG PO TABS: 1.0000 mg | ORAL_TABLET | Freq: Two times a day (BID) | ORAL | 2 refills | Status: DC

## 2018-03-14 MED ORDER — VARENICLINE TARTRATE 0.5 MG X 11 & 1 MG X 42 PO MISC: ORAL | 0 refills | Status: DC

## 2018-03-14 NOTE — Patient Instructions (Signed)
You will be contacted with the results of your xray.    Your medications have been sent to the pharmacy.

## 2018-03-14 NOTE — Progress Notes (Signed)
Subjective: CC: GERD PCP: [name], DO WIO:[id number] I Lupa is a 57 y.o. female presenting to clinic today for:  1. GERD Patient reports having seen her gastroenterologist recently.  She notes that she does not feel that the Protonix was helping with acid reflux and recently switched back over to omeprazole.  She notes that the acid reflux symptoms have improved quite a bit.  Denies any hematochezia, melena.    2.  Tobacco use disorder Patient reports long-standing use of tobacco.  She smokes daily.  She has tried Chantix in the past but felt like she did give it a fair try.  She would like to revisit this medication.  Additionally, she does report coughing up "black stuff" daily and has done this for many years.  Last chest x-ray was in February 2018.  She would like to have this repeated.  3.  Neck pain/low back pain Patient reports long-standing history of neck pain and low back pain.  She states that sometimes she has muscle spasms in the neck which prevent her from moving her neck comfortably.  Denies any preceding injury to the neck.  She does report that several years ago she was told she had "bone-on-bone" in the low back.  She states that her low back will often hurt her after she has been lying for a certain amount of time and then gets up.  She occasionally has numbness and tingling that goes down bilateral lower extremities.  She also notes some intermittent incontinence of her stools.  Recently stools have been normal.  She denies urinary retention or saddle anesthesia.  She had a colonoscopy recently.  She is currently treated with a Valium for anxiety but she does not feel that this helps with her muscle spasms in the neck.  4. Eczema Patient reports that she needs refills on her allergy and eczema medications.  She does note some improvement of the flaking that was appreciated at last visit on her face with the Eucrisa cream.  She states that she needs the Lotrisone for  the occasional cracking she has at the corners of her mouth.  ROS: Per HPI  Allergies  Allergen Reactions  . Vicodin [Hydrocodone-Acetaminophen] Nausea Only   Past Medical History:  Diagnosis Date  . Allergy   . Anxiety   . Blood transfusion without reported diagnosis   . Breast cancer ([place])   . Breast cancer ([place]) 04/21/2012   Stage II (T1c N1 M0) grade 3 triple negative breast cancer, right- sided with 1 of 5 positive nodes, status post FEC in a dose dense fashion for 6 cycles followed by radiation therapy by Dr. Sondra Come for her high-risk disease with her initial date of surgery in February 2008.   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 COPD (chronic obstructive pulmonary disease) ([place])   . Depression   . Emphysema of lung ([place])   . GERD (gastroesophageal reflux disease) 12/18/2012  . Headache   . Hyperlipidemia   . Neuropathy   . Osteoporosis   . PONV (postoperative nausea and vomiting)   . PTSD (post-traumatic stress disorder)   . Substance abuse ([place])     Current Outpatient Medications:  .  albuterol (PROVENTIL HFA;VENTOLIN HFA) 108 (90 Base) MCG/ACT inhaler, Inhale 2 puffs into the lungs every 6 (six) hours as needed for wheezing or shortness of breath., Disp: 1 Inhaler, Rfl: 5 .  Brexpiprazole (REXULTI) 1 MG TABS, Take 1 tablet (1 mg total) by mouth daily., Disp: 30 tablet,  Rfl: 2 .  cholecalciferol (VITAMIN D) 1000 units tablet, Take 1,000 Units by mouth daily., Disp: , Rfl:  .  clotrimazole-betamethasone (LOTRISONE) cream, Apply 1 application topically 2 (two) times daily. To corner of mouth, Disp: 30 g, Rfl: 0 .  Crisaborole (EUCRISA) 2 % OINT, Apply 1 g topically daily. Apply to affected areas on face daily for eczema., Disp: 60 g, Rfl: 1 .  diazepam (VALIUM) 10 MG tablet, Take 1 tablet (10 mg total) by mouth 3 (three) times daily., Disp: 90 tablet, Rfl: 2 .  DULoxetine (CYMBALTA) 60 MG capsule, Take 1 capsule (60 mg total) by mouth 2 (two) times daily.,  Disp: 180 capsule, Rfl: 2 .  fexofenadine (ALLEGRA) 180 MG tablet, Take 180 mg by mouth daily as needed for allergies or rhinitis., Disp: , Rfl:  .  fluticasone-salmeterol (ADVAIR HFA) 115-21 MCG/ACT inhaler, Inhale 2 puffs into the lungs 2 (two) times daily. (Patient taking differently: Inhale 2 puffs into the lungs 2 (two) times daily as needed (shortness of breath). ), Disp: 1 Inhaler, Rfl: 5 .  Loperamide HCl (ANTI-DIARRHEAL PO), Take by mouth as needed., Disp: , Rfl:  .  pantoprazole (PROTONIX) 40 MG tablet, Take 1 tablet (40 mg total) by mouth daily before breakfast., Disp: 30 tablet, Rfl: 11 .  pravastatin (PRAVACHOL) 20 MG tablet, Take 1 tablet (20 mg total) by mouth at bedtime., Disp: 90 tablet, Rfl: 2 .  prazosin (MINIPRESS) 2 MG capsule, Take 1 capsule (2 mg total) by mouth at bedtime., Disp: 30 capsule, Rfl: 2 .  traZODone (DESYREL) 100 MG tablet, Take 3 tablets (300 mg total) by mouth at bedtime., Disp: 90 tablet, Rfl: 2 .  triamcinolone cream (KENALOG) 0.1 %, Apply 1 application topically 2 (two) times daily. Until rash clears, Disp: 30 g, Rfl: 1 .  vitamin C (ASCORBIC ACID) 500 MG tablet, Take 500 mg by mouth daily., Disp: , Rfl:  Social History   Socioeconomic History  . Marital status: Divorced    Spouse name: Not on file  . Number of children: 2  . Years of education: 17  . Highest education level: Not on file  Occupational History  . Occupation: disabled    Comment: neuropathy from [place]  . Financial resource strain: Not on file  . Food insecurity:    Worry: Not on file    Inability: Not on file  . Transportation needs:    Medical: Not on file    Non-medical: Not on file  Tobacco Use  . Smoking status: Current Every Day Smoker    Packs/day: 0.50    Years: 20.00    Pack years: 10.00    Types: Cigarettes    Start date: 05/07/1992  . Smokeless tobacco: Never Used  . Tobacco comment: discussed  Substance and Sexual Activity  . Alcohol use: Yes     Alcohol/week: 6.0 standard drinks    Types: 6 Shots of liquor per week    Comment: occ  . Drug use: No  . Sexual activity: Yes    Birth control/protection: Post-menopausal  Lifestyle  . Physical activity:    Days per week: Not on file    Minutes per session: Not on file  . Stress: Not on file  Relationships  . Social connections:    Talks on phone: Not on file    Gets together: Not on file    Attends religious service: Not on file    Active member of club or organization: Not on file  Attends meetings of clubs or organizations: Not on file    Relationship status: Not on file  . Intimate partner violence:    Fear of current or ex partner: Not on file    Emotionally abused: Not on file    Physically abused: Not on file    Forced sexual activity: Not on file  Other Topics Concern  . Not on file  Social History Narrative   Divorced   Lives alone   Spends time with boyfriend - he is a smoker   Family History  Problem Relation Age of Onset  . COPD Mother   . Depression Mother   . Heart disease Mother   . Hyperlipidemia Mother   . Hypertension Mother   . Cancer Mother        lung cancer, to bone  . Cancer Father        skin  . Heart disease Father   . Hypertension Father   . Hyperlipidemia Father   . Learning disabilities Father   . Drug abuse Brother        overdose at 24  . Drug abuse Daughter        drug overdose 32  . Cancer Maternal Uncle        lung cancer  . Colon cancer Neg Hx     Objective: Office vital signs reviewed. BP 115/80   Pulse 88   Temp 98.1 F (36.7 C) (Oral)   Ht 5\' 4"  (1.626 m)   Wt 184 lb (83.5 kg)   LMP 07/09/2013   BMI 31.58 kg/m   Physical Examination:  General: Awake, alert, well nourished, No acute distress HEENT: Normal, MMM Cardio: regular rate and rhythm, S1S2 heard, no murmurs appreciated Pulm: clear to auscultation bilaterally, no wheezes, rhonchi or rales; normal work of breathing on room air GI: soft, non-tender,  non-distended, bowel sounds present x4, no hepatomegaly, no splenomegaly, no masses Extremities: warm, well perfused, No edema, cyanosis or clubbing; +2 pulses bilaterally Skin: Some flaking of the skin, particularly along the hairline.  No overt skin breakdown appreciated at the corners of the mouth or elsewhere on the face today. MSK  C-spine: Patient has full active range of motion in all planes.  No tenderness to palpation to the midline.  Minimal paraspinal muscle tenderness to palpation along the right trapezius.  No palpable bony abnormalities.  Lumbar spine: Patient has full active range of motion all planes.  She has full active range of motion of bilateral lower extremities.  No tenderness to palpation to the midline but she does have paraspinal muscle tenderness to palpation bilaterally.  Negative straight leg raise. Neuro: 5 out of 5 lower extremity muscle strength bilaterally.  Light touch sensation grossly intact.  Dg Chest 2 View  Result Date: 03/14/2018 CLINICAL DATA:  Chronic cough.  Smoker. EXAM: CHEST - 2 VIEW COMPARISON:  None. FINDINGS: Cardiac silhouette is normal in size. No mediastinal or hilar masses or evidence of adenopathy. Mild scarring noted at the lung apices, greater on the right. Are prominent bronchovascular markings. No evidence of pneumonia or pulmonary edema. No pleural effusion or pneumothorax. There changes from previous right breast surgery. Skeletal structures are intact. IMPRESSION: No active cardiopulmonary disease. Electronically Signed   By: Lajean Manes M.D.   On: 03/14/2018 12:05     Assessment/ Plan: 57 y.o. female   1. Chronic cough Likely related to smoking.  Chest x-ray was obtained and revealed no acute abnormalities.  Pulmonary exam was unremarkable.  She has normal work of breathing on room air.  I agree with smoking cessation.  Chantix has been prescribed.  Instructions for use discussed.  She will follow-up PRN.  If symptoms worsen, low  threshold for CT chest given long-standing smoking history. - DG Chest 2 View; Future  2. Smoker As above - DG Chest 2 View; Future  3. Gastroesophageal reflux disease, esophagitis presence not specified Omeprazole refilled.  Stop Protonix.  4. Eczema, unspecified type Creams have been refilled.  We discussed avoidance of use of corticosteroid to face.  5. Chronic bilateral low back pain with bilateral sciatica Referral to orthopedics has been placed.  Home exercise program recommended including stretching. - Ambulatory referral to Orthopedic Surgery  6. Chronic neck pain - Ambulatory referral to Orthopedic Surgery   Orders Placed This Encounter  Procedures  . DG Chest 2 View    Standing Status:   Future    Number of Occurrences:   1    Standing Expiration Date:   05/15/2019    Order Specific Question:   Reason for Exam (SYMPTOM  OR DIAGNOSIS REQUIRED)    Answer:   coughing up "black stuff" for years. current smoker.    Order Specific Question:   Is patient pregnant?    Answer:   No    Order Specific Question:   Preferred imaging location?    Answer:   Internal    Order Specific Question:   Radiology Contrast Protocol - do NOT remove file path    Answer:   [email or web address]  . Ambulatory referral to Orthopedic Surgery    Referral Priority:   Routine    Referral Type:   Surgical    Referral Reason:   Specialty Services Required    Requested Specialty:   Orthopedic Surgery    Number of Visits Requested:   1   Meds ordered this encounter  Medications  . varenicline (CHANTIX STARTING MONTH PAK) 0.5 MG X 11 & 1 MG X 42 tablet    Sig: Take 0.5 mg tablet by mouth once daily x3 days, then 0.5 mg tablet twice daily x4 days, then increase to one 1 mg tablet twice daily.    Dispense:  53 tablet    Refill:  0  . varenicline (CHANTIX CONTINUING MONTH PAK) 1 MG tablet    Sig: Take 1 tablet (1 mg total) by mouth 2 (two) times daily.     Dispense:  60 tablet    Refill:  2  . triamcinolone cream (KENALOG) 0.1 %    Sig: Apply 1 application topically 2 (two) times daily. Until rash clears    Dispense:  30 g    Refill:  1  . Crisaborole (EUCRISA) 2 % OINT    Sig: Apply 1 g topically daily. Apply to affected areas on face daily for eczema.    Dispense:  60 g    Refill:  1  . clotrimazole-betamethasone (LOTRISONE) cream    Sig: Apply 1 application topically 2 (two) times daily. To corner of mouth    Dispense:  30 g    Refill:  0  . fexofenadine (ALLEGRA) 180 MG tablet    Sig: Take 1 tablet (180 mg total) by mouth daily as needed for allergies or rhinitis.    Dispense:  90 tablet    Refill:  3  . omeprazole (PRILOSEC) 20 MG capsule    Sig: Take 1 capsule (20 mg total) by mouth daily.    Dispense:  90 capsule  Refill:  [place], [place] [phone]

## 2018-03-14 NOTE — Addendum Note (Signed)
Addended byCarrolyn Leigh on: 03/14/2018 01:55 PM   Modules accepted: Orders

## 2018-03-17 ENCOUNTER — Encounter: Payer: Self-pay | Admitting: Nurse Practitioner

## 2018-03-17 NOTE — Assessment & Plan Note (Signed)
Rare hematochezia related to hemorrhoids and Tucks pads typically help.  Colonoscopy up-to-date 11/05/2017 which is reassuring.  Will recommend strict dairy avoidance, use Imodium, follow-up in 3 months.

## 2018-03-17 NOTE — Assessment & Plan Note (Signed)
Diarrhea does seem somewhat improved.  She stopped eating milk and ice cream but does still have significant amount of dairy in her coffee every morning.  Has a bowel movement 1-2 times a day which ranges from [facility] 4-6 and 4 stools a week with diarrhea, gas.  Occasionally has fecal incontinence related to this.  Recommend she continue her current medications, strict dairy avoidance including cream and coffee.  Use Imodium as needed, follow-up in 3 months.

## 2018-03-17 NOTE — Assessment & Plan Note (Signed)
Breakthrough GERD symptoms which are currently being managed by primary care.  She was previously on Prilosec and changed to another medicine that she cannot remember but it did not work well.  She does have a follow-up appoint with her primary care.  Continue medications per their recommendations.

## 2018-03-18 NOTE — Progress Notes (Signed)
cc'ed to pcp °

## 2018-03-27 ENCOUNTER — Ambulatory Visit (INDEPENDENT_AMBULATORY_CARE_PROVIDER_SITE_OTHER): Payer: Medicare Other | Admitting: Orthopaedic Surgery

## 2018-04-10 ENCOUNTER — Ambulatory Visit (INDEPENDENT_AMBULATORY_CARE_PROVIDER_SITE_OTHER): Payer: Self-pay

## 2018-04-10 ENCOUNTER — Ambulatory Visit (INDEPENDENT_AMBULATORY_CARE_PROVIDER_SITE_OTHER): Payer: Medicare Other | Admitting: Orthopaedic Surgery

## 2018-04-10 ENCOUNTER — Encounter (INDEPENDENT_AMBULATORY_CARE_PROVIDER_SITE_OTHER): Payer: Self-pay | Admitting: Orthopaedic Surgery

## 2018-04-10 VITALS — BP 119/76 | HR 80 | Ht 64.0 in | Wt 178.0 lb

## 2018-04-10 DIAGNOSIS — G8929 Other chronic pain: Secondary | ICD-10-CM

## 2018-04-10 DIAGNOSIS — M545 Low back pain, unspecified: Secondary | ICD-10-CM

## 2018-04-10 DIAGNOSIS — M542 Cervicalgia: Secondary | ICD-10-CM

## 2018-04-10 NOTE — Progress Notes (Signed)
Office Visit Note/orthopedic consultation   Patient: [name]           Date of Birth: [birth year]           MRN: [id number] Visit Date: 04/10/2018              Requested by: [name], DO [address] [place], [place] [address] PCP: [name], DO   Assessment & Plan: Visit Diagnoses:  1. Neck pain   2. Chronic bilateral low back pain, unspecified whether sciatica present     Plan: Patient is not been through any physical therapy has core weakness.  We will set up for some physical therapy for evaluation and treatment of chronic neck and low back pain.  I plan to recheck her in 5 weeks and if she does not respond we will consider diagnostic imaging.  No evidence of radiculopathy and no claudication symptoms.  Thank you for the opportunity to see her in consultation. Follow-Up Instructions: Return in about 5 weeks (around 05/15/2018).   Orders:  Orders Placed This Encounter  Procedures  . XR Cervical Spine 2 or 3 views  . XR Lumbar Spine 2-3 Views  . Ambulatory referral to Physical Therapy   No orders of the defined types were placed in this encounter.     Procedures: No procedures performed   Clinical Data: No additional findings.   Subjective: Chief Complaint  Patient presents with  . Neck - Pain  . Lower Back - Pain    HPI 57 year old female with low back pain as well as neck pain.  She states she has numbness and tingling in both feet and has been diagnosed with neuropathy.  She had breast cancer 08-06-06 on the right side had mastectomy and had some changes on the opposite left breast had bilateral mastectomies with positive lymph nodes and radiation on the right side.  Still has tiny tattoo marker and radiation appears to be right of midline not over the thoracic spine.  Has had pain across the lower back been treated with massage treatment.  She denies associated bowel or bladder symptoms.  Patient's 1/2 pack smoker x20 years.  Patient is not  working she is on disability.  Chart reviewed documents treatment for depression related with a daughter died from overdose 06-Aug-2011.  Dr. Lajuana Ripple sent patient for consultation.  Review of Systems is systems positive for depression anxiety, COPD, chemotherapy-induced neuropathy, prediabetes, tobacco abuse and history of bronchitis.'s for acid reflux.   Objective: Vital Signs: BP 119/76   Pulse 80   Ht 5\' 4"  (1.626 m)   Wt 178 lb (80.7 kg)   LMP 07/09/2013   BMI 30.55 kg/m   Physical Exam  Constitutional: She is oriented to person, place, and time. She appears well-developed.  HENT:  Head: Normocephalic.  Right Ear: External ear normal.  Left Ear: External ear normal.  Eyes: Pupils are equal, round, and reactive to light.  Neck: No tracheal deviation present. No thyromegaly present.  Cardiovascular: Normal rate.  Pulmonary/Chest: Effort normal.  Abdominal: Soft.  Neurological: She is alert and oriented to person, place, and time.  Skin: Skin is warm and dry.  Psychiatric: She has a normal mood and affect. Her behavior is normal.    Ortho Exam patient has intact upper and lower extremity reflexes.  Decreased sensation hands and stocking distribution of feet without anterior tib gastrocsoleus weakness.  Negative straight leg raising negative logroll of the hips.  No quad weakness.  Tenderness over the lumbar spine.  She is unable to do a sit up.  No supraclavicular lymphadenopathy.  No brachial plexus tenderness negative [place].  Specialty Comments:  No specialty comments available.  Imaging: C-spine lumbar spine x-rays obtained today negative for acute changes see description.   PMFS History: Patient Active Problem List   Diagnosis Date Noted  . Diarrhea 09/03/2017  . Rectal bleeding 09/03/2017  . Melena 09/03/2017  . Eczema 09/02/2017  . Prediabetes 12/14/2016  . 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 COPD mixed type ([place]) 06/21/2016  . HLD  (hyperlipidemia) 06/21/2016  . Vitamin D deficiency 06/21/2016  . Post traumatic stress disorder (PTSD) 06/21/2016  . Angular cheilitis 06/21/2016  . Major depression 02/05/2014  . Tobacco abuse 12/18/2012  . GERD (gastroesophageal reflux disease) 12/18/2012  . Breast cancer ([place]) 04/21/2012   Past Medical History:  Diagnosis Date  . Allergy   . Anxiety   . Blood transfusion without reported diagnosis   . Breast cancer ([place])   . Breast cancer ([place]) 04/21/2012   Stage II (T1c N1 M0) grade 3 triple negative breast cancer, right- sided with 1 of 5 positive nodes, status post FEC in a dose dense fashion for 6 cycles followed by radiation therapy by Dr. Sondra Come for her high-risk disease with her initial date of surgery in February 2008.   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 COPD (chronic obstructive pulmonary disease) ([place])   . Depression   . Emphysema of lung ([place])   . GERD (gastroesophageal reflux disease) 12/18/2012  . Headache   . Hyperlipidemia   . Neuropathy   . Osteoporosis   . PONV (postoperative nausea and vomiting)   . PTSD (post-traumatic stress disorder)   . Substance abuse ([place])     Family History  Problem Relation Age of Onset  . COPD Mother   . Depression Mother   . Heart disease Mother   . Hyperlipidemia Mother   . Hypertension Mother   . Cancer Mother        lung cancer, to bone  . Cancer Father        skin  . Heart disease Father   . Hypertension Father   . Hyperlipidemia Father   . Learning disabilities Father   . Drug abuse Brother        overdose at 36  . Drug abuse Daughter        drug overdose 32  . Cancer Maternal Uncle        lung cancer  . Colon cancer Neg Hx     Past Surgical History:  Procedure Laterality Date  . BIOPSY  11/05/2017   Procedure: BIOPSY;  Surgeon: Danie Binder, MD;  Location: [facility];  Service: Endoscopy;;  colon duodenum gastric  . BREAST SURGERY    . CATARACT EXTRACTION W/PHACO Left 08/15/2017    Procedure: CATARACT EXTRACTION WITH PHACOEMULSIFICATION  AND INTRAOCULAR LENS PLACEMENT LEFT EYE CDE=2.68;  Surgeon: Baruch Goldmann, MD;  Location: [facility];  Service: Ophthalmology;  Laterality: Left;  left  . CATARACT EXTRACTION W/PHACO Right 09/06/2017   Procedure: CATARACT EXTRACTION PHACO AND INTRAOCULAR LENS PLACEMENT (IOC);  Surgeon: Baruch Goldmann, MD;  Location: [facility];  Service: Ophthalmology;  Laterality: Right;  CDE: 1.61  . COLONOSCOPY WITH PROPOFOL N/A 11/05/2017   Procedure: COLONOSCOPY WITH PROPOFOL;  Surgeon: Danie Binder, MD;  Location: [facility];  Service: Endoscopy;  Laterality: N/A;  12:30pm  . ESOPHAGOGASTRODUODENOSCOPY  03/10/2008   SLF: Normal esophagus without  evidence of Barrett, mass, erosion  ulceration, or stricture, small bowel bx negative, gastritis with NO h.pylori  . ESOPHAGOGASTRODUODENOSCOPY (EGD) WITH PROPOFOL N/A 11/05/2017   Procedure: ESOPHAGOGASTRODUODENOSCOPY (EGD) WITH PROPOFOL;  Surgeon: Danie Binder, MD;  Location: [facility];  Service: Endoscopy;  Laterality: N/A;  . leocolonoscopy  03/10/2008   GEX:BMWUXL terminal ileum, approximately 10 cm visualized/Normal colon without evidence of polyps, mass, inflammatory changes, diverticula, or AVMs/Normal retroflexed view of the rectum. random colon bx negative.  [name] MASTECTOMY     bilateral, status post reconstruction.  [name] POLYPECTOMY  11/05/2017   Procedure: POLYPECTOMY;  Surgeon: Danie Binder, MD;  Location: [facility];  Service: Endoscopy;;  colon  . TUBAL LIGATION     Social History   Occupational History  . Occupation: disabled    Comment: neuropathy from chemo  Tobacco Use  . Smoking status: Current Every Day Smoker    Packs/day: 0.50    Years: 20.00    Pack years: 10.00    Types: Cigarettes    Start date: 05/07/1992  . Smokeless tobacco: Never Used  . Tobacco comment: discussed  Substance and Sexual Activity  . Alcohol use: Yes    Alcohol/week: 6.0 standard drinks    Types: 6 Shots of  liquor per week    Comment: occ  . Drug use: No  . Sexual activity: Yes    Birth control/protection: Post-menopausal

## 2018-05-08 ENCOUNTER — Telehealth: Payer: Self-pay | Admitting: Family Medicine

## 2018-05-12 ENCOUNTER — Ambulatory Visit (HOSPITAL_COMMUNITY): Payer: Medicare Other | Admitting: Psychiatry

## 2018-05-16 DIAGNOSIS — Z421 Encounter for breast reconstruction following mastectomy: Secondary | ICD-10-CM | POA: Diagnosis not present

## 2018-05-30 ENCOUNTER — Inpatient Hospital Stay (HOSPITAL_COMMUNITY): Payer: Medicare HMO | Attending: Hematology | Admitting: Hematology

## 2018-05-30 ENCOUNTER — Ambulatory Visit (HOSPITAL_COMMUNITY): Payer: Self-pay | Admitting: Internal Medicine

## 2018-05-30 NOTE — Progress Notes (Deleted)
[name], [place] [address]   CLINIC:  Medical Oncology/Hematology  PCP:  [name], DO [place] [address] [phone]   REASON FOR VISIT: Follow-up for   INTERVAL HISTORY:  [name] 58 y.o. female returns for routine follow-up    REVIEW OF SYSTEMS:  Review of Systems - Oncology   PAST MEDICAL/SURGICAL HISTORY:  Past Medical History:  Diagnosis Date  . Allergy   . Anxiety   . Blood transfusion without reported diagnosis   . Breast cancer ([place])   . Breast cancer ([place]) 04/21/2012   Stage II (T1c N1 M0) grade 3 triple negative breast cancer, right- sided with 1 of 5 positive nodes, status post FEC in a dose dense fashion for 6 cycles followed by radiation therapy by Dr. Sondra Come for her high-risk disease with her initial date of surgery in February 2008.   [name] Chemotherapy-induced neuropathy ([place]) 06/21/2016  . COPD (chronic obstructive pulmonary disease) ([place])   . Depression   . Emphysema of lung ([place])   . GERD (gastroesophageal reflux disease) 12/18/2012  . Headache   . Hyperlipidemia   . Neuropathy   . Osteoporosis   . PONV (postoperative nausea and vomiting)   . PTSD (post-traumatic stress disorder)   . Substance abuse [facility])    Past Surgical History:  Procedure Laterality Date  . BIOPSY  11/05/2017   Procedure: BIOPSY;  Surgeon: Danie Binder, MD;  Location: [facility];  Service: Endoscopy;;  colon duodenum gastric  . BREAST SURGERY    . CATARACT EXTRACTION W/PHACO Left 08/15/2017   Procedure: CATARACT EXTRACTION WITH PHACOEMULSIFICATION  AND INTRAOCULAR LENS PLACEMENT LEFT EYE CDE=2.68;  Surgeon: Baruch Goldmann, MD;  Location: [facility] ORS;  Service: Ophthalmology;  Laterality: Left;  left  . CATARACT EXTRACTION W/PHACO Right 09/06/2017   Procedure: CATARACT EXTRACTION PHACO AND INTRAOCULAR LENS PLACEMENT (IOC);  Surgeon: Baruch Goldmann, MD;  Location: [facility] ORS;  Service: Ophthalmology;  Laterality: Right;   CDE: 1.61  . COLONOSCOPY WITH PROPOFOL N/A 11/05/2017   Procedure: COLONOSCOPY WITH PROPOFOL;  Surgeon: Danie Binder, MD;  Location: [facility];  Service: Endoscopy;  Laterality: N/A;  12:30pm  . ESOPHAGOGASTRODUODENOSCOPY  03/10/2008   SLF: Normal esophagus without evidence of Barrett, mass, erosion  ulceration, or stricture, small bowel bx negative, gastritis with NO h.pylori  . ESOPHAGOGASTRODUODENOSCOPY (EGD) WITH PROPOFOL N/A 11/05/2017   Procedure: ESOPHAGOGASTRODUODENOSCOPY (EGD) WITH PROPOFOL;  Surgeon: Danie Binder, MD;  Location: [facility];  Service: Endoscopy;  Laterality: N/A;  . leocolonoscopy  03/10/2008   EXB:MWUXLK terminal ileum, approximately 10 cm visualized/Normal colon without evidence of polyps, mass, inflammatory changes, diverticula, or AVMs/Normal retroflexed view of the rectum. random colon bx negative.  [name] MASTECTOMY     bilateral, status post reconstruction.  [name] POLYPECTOMY  11/05/2017   Procedure: POLYPECTOMY;  Surgeon: Danie Binder, MD;  Location[facility];  Service: Endoscopy;;  colon  . TUBAL LIGATION       SOCIAL HISTORY:  Social History   Socioeconomic History  . Marital status: Divorced    Spouse name: Not on file  . Number of children: 2  . Years of education: 45  . Highest education level: Not on file  Occupational History  . Occupation: disabled    Comment: neuropathy from [place]  . Financial resource strain: Not on file  . Food insecurity:    Worry: Not on file  Inability: Not on file  . Transportation needs:    Medical: Not on file    Non-medical: Not on file  Tobacco Use  . Smoking status: Current Every Day Smoker    Packs/day: 0.50    Years: 20.00    Pack years: 10.00    Types: Cigarettes    Start date: 05/07/1992  . Smokeless tobacco: Never Used  . Tobacco comment: discussed  Substance and Sexual Activity  . Alcohol use: Yes    Alcohol/week: 6.0 standard drinks    Types: 6 Shots of liquor per week     Comment: occ  . Drug use: No  . Sexual activity: Yes    Birth control/protection: Post-menopausal  Lifestyle  . Physical activity:    Days per week: Not on file    Minutes per session: Not on file  . Stress: Not on file  Relationships  . Social connections:    Talks on phone: Not on file    Gets together: Not on file    Attends religious service: Not on file    Active member of club or organization: Not on file    Attends meetings of clubs or organizations: Not on file    Relationship status: Not on file  . Intimate partner violence:    Fear of current or ex partner: Not on file    Emotionally abused: Not on file    Physically abused: Not on file    Forced sexual activity: Not on file  Other Topics Concern  . Not on file  Social History Narrative   Divorced   Lives alone   Spends time with boyfriend - he is a smoker    FAMILY HISTORY:  Family History  Problem Relation Age of Onset  . COPD Mother   . Depression Mother   . Heart disease Mother   . Hyperlipidemia Mother   . Hypertension Mother   . Cancer Mother        lung cancer, to bone  . Cancer Father        skin  . Heart disease Father   . Hypertension Father   . Hyperlipidemia Father   . Learning disabilities Father   . Drug abuse Brother        overdose at 91  . Drug abuse Daughter        drug overdose 55  . Cancer Maternal Uncle        lung cancer  . Colon cancer Neg Hx     CURRENT MEDICATIONS:  Outpatient Encounter Medications as of 05/30/2018  Medication Sig  . albuterol (PROVENTIL HFA;VENTOLIN HFA) 108 (90 Base) MCG/ACT inhaler Inhale 2 puffs into the lungs every 6 (six) hours as needed for wheezing or shortness of breath.  . Brexpiprazole (REXULTI) 1 MG TABS Take 1 tablet (1 mg total) by mouth daily.  . cholecalciferol (VITAMIN D) 1000 units tablet Take 1,000 Units by mouth daily.  . clotrimazole-betamethasone (LOTRISONE) cream Apply 1 application topically 2 (two) times daily. To corner of mouth    . Crisaborole (EUCRISA) 2 % OINT Apply 1 g topically daily. Apply to affected areas on face daily for eczema.  . diazepam (VALIUM) 10 MG tablet Take 1 tablet (10 mg total) by mouth 3 (three) times daily.  . DULoxetine (CYMBALTA) 60 MG capsule Take 1 capsule (60 mg total) by mouth 2 (two) times daily.  . fexofenadine (ALLEGRA) 180 MG tablet Take 1 tablet (180 mg total) by mouth daily as needed for allergies or  rhinitis.  . fluticasone-salmeterol (ADVAIR HFA) 115-21 MCG/ACT inhaler Inhale 2 puffs into the lungs 2 (two) times daily. (Patient taking differently: Inhale 2 puffs into the lungs 2 (two) times daily as needed (shortness of breath). )  . Loperamide HCl (ANTI-DIARRHEAL PO) Take by mouth as needed.  [name] omeprazole (PRILOSEC) 20 MG capsule Take 1 capsule (20 mg total) by mouth daily.  . pravastatin (PRAVACHOL) 20 MG tablet Take 1 tablet (20 mg total) by mouth at bedtime.  . prazosin (MINIPRESS) 2 MG capsule Take 1 capsule (2 mg total) by mouth at bedtime.  . traZODone (DESYREL) 100 MG tablet Take 3 tablets (300 mg total) by mouth at bedtime.  . triamcinolone cream (KENALOG) 0.1 % Apply 1 application topically 2 (two) times daily. Until rash clears  . varenicline (CHANTIX CONTINUING MONTH PAK) 1 MG tablet Take 1 tablet (1 mg total) by mouth 2 (two) times daily.  . varenicline (CHANTIX STARTING MONTH PAK) 0.5 MG X 11 & 1 MG X 42 tablet Take 0.5 mg tablet by mouth once daily x3 days, then 0.5 mg tablet twice daily x4 days, then increase to one 1 mg tablet twice daily.  . vitamin C (ASCORBIC ACID) 500 MG tablet Take 500 mg by mouth daily.   No facility-administered encounter medications on file as of 05/30/2018.     ALLERGIES:  Allergies  Allergen Reactions  . Vicodin [Hydrocodone-Acetaminophen] Nausea Only     PHYSICAL EXAM:  ECOG Performance status: 1  There were no vitals filed for this visit. There were no vitals filed for this visit.  Physical Exam   LABORATORY DATA:  I have  reviewed the labs as listed.  CBC    Component Value Date/Time   WBC 9.6 11/05/2017 1247   RBC 4.65 11/05/2017 1247   HGB 14.3 11/05/2017 1247   HCT 44.0 11/05/2017 1247   PLT 277 11/05/2017 1247   MCV 94.6 11/05/2017 1247   MCH 30.8 11/05/2017 1247   MCHC 32.5 11/05/2017 1247   RDW 13.2 11/05/2017 1247   LYMPHSABS 2.7 11/05/2017 1247   MONOABS 0.6 11/05/2017 1247   EOSABS 0.1 11/05/2017 1247   BASOSABS 0.0 11/05/2017 1247   CMP Latest Ref Rng & Units 07/08/2017 11/23/2016 07/19/2016  Glucose 65 - 139 mg/dL 112 95 82  BUN 7 - 25 mg/dL 10 13 10   Creatinine 0.50 - 1.05 mg/dL 0.64 0.73 0.66  Sodium 135 - 146 mmol/L 139 138 139  Potassium 3.5 - 5.3 mmol/L 4.4 4.4 4.5  Chloride 98 - 110 mmol/L 104 101 102  CO2 20 - 32 mmol/L 29 25 29   Calcium 8.6 - 10.4 mg/dL 9.5 9.5 9.2  Total Protein 6.1 - 8.1 g/dL 7.0 6.7 6.7  Total Bilirubin 0.2 - 1.2 mg/dL 0.4 0.2 0.4  Alkaline Phos 33 - 130 U/L - 76 59  AST 10 - 35 U/L 17 16 20   ALT 6 - 29 U/L 17 17 20        DIAGNOSTIC IMAGING:  I have independently reviewed the scans and discussed with the patient.   I have reviewed Francene Finders, NP's note and agree with the documentation.  I personally performed a face-to-face visit, made revisions and my assessment and plan is as follows.    ASSESSMENT & PLAN:   No problem-specific Assessment & Plan notes found for this encounter.      Orders placed this encounter:  No orders of the defined types were placed in this encounter.     Derek Jack, MD  [place] [phone]

## 2018-06-11 ENCOUNTER — Ambulatory Visit: Payer: Medicare Other | Admitting: Nurse Practitioner

## 2018-06-12 DIAGNOSIS — J449 Chronic obstructive pulmonary disease, unspecified: Secondary | ICD-10-CM | POA: Diagnosis not present

## 2018-06-12 DIAGNOSIS — E785 Hyperlipidemia, unspecified: Secondary | ICD-10-CM | POA: Diagnosis not present

## 2018-06-12 DIAGNOSIS — Z421 Encounter for breast reconstruction following mastectomy: Secondary | ICD-10-CM | POA: Diagnosis not present

## 2018-06-12 DIAGNOSIS — Z79899 Other long term (current) drug therapy: Secondary | ICD-10-CM | POA: Diagnosis not present

## 2018-06-12 DIAGNOSIS — K219 Gastro-esophageal reflux disease without esophagitis: Secondary | ICD-10-CM | POA: Diagnosis not present

## 2018-06-12 DIAGNOSIS — N651 Disproportion of reconstructed breast: Secondary | ICD-10-CM | POA: Diagnosis not present

## 2018-06-12 DIAGNOSIS — L905 Scar conditions and fibrosis of skin: Secondary | ICD-10-CM | POA: Diagnosis not present

## 2018-06-12 DIAGNOSIS — Z853 Personal history of malignant neoplasm of breast: Secondary | ICD-10-CM | POA: Diagnosis not present

## 2018-07-01 ENCOUNTER — Telehealth: Payer: Self-pay | Admitting: Family Medicine

## 2018-07-09 ENCOUNTER — Ambulatory Visit: Payer: Medicare Other

## 2018-07-11 ENCOUNTER — Ambulatory Visit (HOSPITAL_COMMUNITY): Payer: Self-pay | Admitting: Psychiatry

## 2018-07-21 ENCOUNTER — Ambulatory Visit (INDEPENDENT_AMBULATORY_CARE_PROVIDER_SITE_OTHER): Payer: Medicare Other | Admitting: Psychiatry

## 2018-07-21 ENCOUNTER — Encounter (HOSPITAL_COMMUNITY): Payer: Self-pay | Admitting: Psychiatry

## 2018-07-21 ENCOUNTER — Other Ambulatory Visit: Payer: Self-pay

## 2018-07-21 VITALS — BP 120/78 | HR 86 | Ht 65.0 in | Wt 183.0 lb

## 2018-07-21 DIAGNOSIS — Z79899 Other long term (current) drug therapy: Secondary | ICD-10-CM | POA: Diagnosis not present

## 2018-07-21 DIAGNOSIS — F332 Major depressive disorder, recurrent severe without psychotic features: Secondary | ICD-10-CM | POA: Diagnosis not present

## 2018-07-21 DIAGNOSIS — F4310 Post-traumatic stress disorder, unspecified: Secondary | ICD-10-CM | POA: Diagnosis not present

## 2018-07-21 MED ORDER — PRAZOSIN HCL 5 MG PO CAPS: 5.0000 mg | ORAL_CAPSULE | Freq: Every day | ORAL | 2 refills | Status: DC

## 2018-07-21 MED ORDER — DIAZEPAM 10 MG PO TABS: 10.0000 mg | ORAL_TABLET | Freq: Three times a day (TID) | ORAL | 2 refills | Status: DC

## 2018-07-21 MED ORDER — TRAZODONE HCL 100 MG PO TABS: 300.0000 mg | ORAL_TABLET | Freq: Every day | ORAL | 2 refills | Status: DC

## 2018-07-21 MED ORDER — DULOXETINE HCL 60 MG PO CPEP: 60.0000 mg | ORAL_CAPSULE | Freq: Two times a day (BID) | ORAL | 2 refills | Status: DC

## 2018-07-21 NOTE — Progress Notes (Signed)
[facility] MD/PA/NP OP Progress Note  07/21/2018 2:44 PM [name]  MRN:  [id number]  Chief Complaint:  Chief Complaint    Depression; Anxiety; Follow-up     WNI:OEVO patientis a 58 year old divorced white female who lives with her boyfriend in [place]. She has one son age 81. She had a daughter who died at age 85 in 08-12-11 of a narcotic overdose. The patient is on disability. She is self-referred.  The patient states that she began getting depressed in her early 60s. She admits to suicide attempts but was never hospitalized but was treated in outpatient clinics. She's been on numerous medications over the years including Celexa, Abilify, Zoloft, amitriptyline, clonazepam and Xanax. She used to go to [place] and families followed by a clinic in [place] and most recently was prescribed medication by her physician at [facility]. Her physician left and she no longer has access to psychiatric medications. She was on a combination of Celexa, Abilify and clonazepam. She's been off medication for 6 months.  The patient states that her depression got considerably worse after her daughter died in 2011-08-12. The patient witnessed her daughter's death and did not knowwhat was going on. She watched her get drowsy through the entire day but didn't realize that she was going through an overdose. By the time she called 911 it was too late. The patient blames herself for her daughter's death. She has nightmares and flashbacks about the events of that day and can't stop thinking about that. She has not had any counseling since her daughter died.  Currently she cries all the time and has no energy. She does not eat well. She is frightened by the house and has constant panic attacks. Her sleep is disrupted by nightmares. She does not hear voices but sometimes hears noises. She's been trying to cope by drinking several shots of alcohol every few days. She claims she used to smoke marijuana but hasn't for one  month. She does not use other drugs. She has a boyfriend but is isolating herself from him and does not do much with other friends or family members. She has passive suicidal ideation and "wishes I would die and be with my daughter" but denies any thoughts of self-harm. \  The pt returns after 3 months, basically doing well. Prazosin has helped but stilll having vivid dreams. They are less frightening. She agrees to try an increase in dose. Mood has generally been stable despite new deaths in the family. She denies suicidal ideation     Visit Diagnosis:    ICD-10-CM   1. 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 Allergy   . Anxiety   . Blood transfusion without reported diagnosis   . Breast cancer ([place])   . Breast cancer ([place]) 04/21/2012   Stage II (T1c N1 M0) grade 3 triple negative breast cancer, right- sided with 1 of 5 positive nodes, status post FEC in a dose dense fashion for 6 cycles followed by radiation therapy by Dr. Sondra Come for her high-risk disease with her initial date of surgery in February 2008.   [name] Chemotherapy-induced neuropathy ([place]) 06/21/2016  . COPD (chronic obstructive pulmonary disease) ([place])   . Depression   . Emphysema of lung ([place])   . GERD (gastroesophageal reflux disease) 12/18/2012  . Headache   . Hyperlipidemia   . Neuropathy   .  Osteoporosis   . PONV (postoperative nausea and vomiting)   . PTSD (post-traumatic stress disorder)   . Substance abuse [facility])     Past Surgical History:  Procedure Laterality Date  . BIOPSY  11/05/2017   Procedure: BIOPSY;  Surgeon: Danie Binder, MD;  Location: [facility];  Service: Endoscopy;;  colon duodenum gastric  . BREAST SURGERY    . CATARACT EXTRACTION W/PHACO Left 08/15/2017   Procedure: CATARACT EXTRACTION WITH PHACOEMULSIFICATION  AND INTRAOCULAR  LENS PLACEMENT LEFT EYE CDE=2.68;  Surgeon: Baruch Goldmann, MD;  Location: [facility] ORS;  Service: Ophthalmology;  Laterality: Left;  left  . CATARACT EXTRACTION W/PHACO Right 09/06/2017   Procedure: CATARACT EXTRACTION PHACO AND INTRAOCULAR LENS PLACEMENT (IOC);  Surgeon: Baruch Goldmann, MD;  Location: [facility] ORS;  Service: Ophthalmology;  Laterality: Right;  CDE: 1.61  . COLONOSCOPY WITH PROPOFOL N/A 11/05/2017   Procedure: COLONOSCOPY WITH PROPOFOL;  Surgeon: Danie Binder, MD;  Location: [facility];  Service: Endoscopy;  Laterality: N/A;  12:30pm  . ESOPHAGOGASTRODUODENOSCOPY  03/10/2008   SLF: Normal esophagus without evidence of Barrett, mass, erosion  ulceration, or stricture, small bowel bx negative, gastritis with NO h.pylori  . ESOPHAGOGASTRODUODENOSCOPY (EGD) WITH PROPOFOL N/A 11/05/2017   Procedure: ESOPHAGOGASTRODUODENOSCOPY (EGD) WITH PROPOFOL;  Surgeon: Danie Binder, MD;  Location: [facility];  Service: Endoscopy;  Laterality: N/A;  . leocolonoscopy  03/10/2008   GUY:QIHKVQ terminal ileum, approximately 10 cm visualized/Normal colon without evidence of polyps, mass, inflammatory changes, diverticula, or AVMs/Normal retroflexed view of the rectum. random colon bx negative.  [name] MASTECTOMY     bilateral, status post reconstruction.  [name] POLYPECTOMY  11/05/2017   Procedure: POLYPECTOMY;  Surgeon: Danie Binder, MD;  [facility];  Service: Endoscopy;;  colon  . TUBAL LIGATION      Family Psychiatric History: see below  Family History:  Family History  Problem Relation Age of Onset  . COPD Mother   . Depression Mother   . Heart disease Mother   . Hyperlipidemia Mother   . Hypertension Mother   . Cancer Mother        lung cancer, to bone  . Cancer Father        skin  . Heart disease Father   . Hypertension Father   . Hyperlipidemia Father   . Learning disabilities Father   . Drug abuse Brother        overdose at 74  . Drug abuse Daughter        drug overdose 36  .  Cancer Maternal Uncle        lung cancer  . Colon cancer Neg Hx     Social History:  Social History   Socioeconomic History  . Marital status: Divorced    Spouse name: Not on file  . Number of children: 2  . Years of education: 6  . Highest education level: Not on file  Occupational History  . Occupation: disabled    Comment: neuropathy from [place]  . Financial resource strain: Not on file  . Food insecurity:    Worry: Not on file    Inability: Not on file  . Transportation needs:    Medical: Not on file    Non-medical: Not on file  Tobacco Use  . Smoking status: Current Every Day Smoker    Packs/day: 0.50    Years: 20.00    Pack years: 10.00    Types: Cigarettes    Start date:  05/07/1992  . Smokeless tobacco: Never Used  . Tobacco comment: discussed  Substance and Sexual Activity  . Alcohol use: Yes    Alcohol/week: 6.0 standard drinks    Types: 6 Shots of liquor per week    Comment: occ  . Drug use: No  . Sexual activity: Yes    Birth control/protection: Post-menopausal  Lifestyle  . Physical activity:    Days per week: Not on file    Minutes per session: Not on file  . Stress: Not on file  Relationships  . Social connections:    Talks on phone: Not on file    Gets together: Not on file    Attends religious service: Not on file    Active member of club or organization: Not on file    Attends meetings of clubs or organizations: Not on file    Relationship status: Not on file  Other Topics Concern  . Not on file  Social History Narrative   Divorced   Lives alone   Spends time with boyfriend - he is a smoker    Allergies:  Allergies  Allergen Reactions  . Vicodin [Hydrocodone-Acetaminophen] Nausea Only    Metabolic Disorder Labs: No results found for: HGBA1C, MPG No results found for: PROLACTIN Lab Results  Component Value Date   CHOL 177 07/08/2017   TRIG 120 07/08/2017   HDL 52 07/08/2017   CHOLHDL 3.4 07/08/2017   VLDL 42 (H)  11/23/2016   LDLCALC 103 (H) 07/08/2017   LDLCALC 120 (H) 11/23/2016   Lab Results  Component Value Date   TSH 2.26 11/29/2015   TSH 1.197 03/28/2010    Therapeutic Level Labs: No results found for: LITHIUM No results found for: VALPROATE No components found for:  CBMZ  Current Medications: Current Outpatient Medications  Medication Sig Dispense Refill  . Brexpiprazole (REXULTI) 1 MG TABS Take 1 tablet (1 mg total) by mouth daily. 30 tablet 2  . cholecalciferol (VITAMIN D) 1000 units tablet Take 1,000 Units by mouth daily.    . clotrimazole-betamethasone (LOTRISONE) cream Apply 1 application topically 2 (two) times daily. To corner of mouth 30 g 0  . Crisaborole (EUCRISA) 2 % OINT Apply 1 g topically daily. Apply to affected areas on face daily for eczema. 60 g 1  . diazepam (VALIUM) 10 MG tablet Take 1 tablet (10 mg total) by mouth 3 (three) times daily. 90 tablet 2  . DULoxetine (CYMBALTA) 60 MG capsule Take 1 capsule (60 mg total) by mouth 2 (two) times daily. 180 capsule 2  . fexofenadine (ALLEGRA) 180 MG tablet Take 1 tablet (180 mg total) by mouth daily as needed for allergies or rhinitis. 90 tablet 3  . fluticasone-salmeterol (ADVAIR HFA) 115-21 MCG/ACT inhaler Inhale 2 puffs into the lungs 2 (two) times daily. (Patient taking differently: Inhale 2 puffs into the lungs 2 (two) times daily as needed (shortness of breath). ) 1 Inhaler 5  . Loperamide HCl (ANTI-DIARRHEAL PO) Take by mouth as needed.    [name] omeprazole (PRILOSEC) 20 MG capsule Take 1 capsule (20 mg total) by mouth daily. 90 capsule 3  . pravastatin (PRAVACHOL) 20 MG tablet Take 1 tablet (20 mg total) by mouth at bedtime. 90 tablet 2  . traZODone (DESYREL) 100 MG tablet Take 3 tablets (300 mg total) by mouth at bedtime. 90 tablet 2  . triamcinolone cream (KENALOG) 0.1 % Apply 1 application topically 2 (two) times daily. Until rash clears 30 g 1  . vitamin C (ASCORBIC ACID)  500 MG tablet Take 500 mg by mouth daily.    [name]  albuterol (PROVENTIL HFA;VENTOLIN HFA) 108 (90 Base) MCG/ACT inhaler Inhale 2 puffs into the lungs every 6 (six) hours as needed for wheezing or shortness of breath. 1 Inhaler 5  . prazosin (MINIPRESS) 5 MG capsule Take 1 capsule (5 mg total) by mouth at bedtime. 30 capsule 2   No current facility-administered medications for this visit.      Musculoskeletal: Strength & Muscle Tone: within normal limits Gait & Station: normal Patient leans: N/A  Psychiatric Specialty Exam: Review of Systems  All other systems reviewed and are negative.   Blood pressure 120/78, pulse 86, height 5\' 5"  (1.651 m), weight 183 lb (83 kg), last menstrual period 07/09/2013.Body mass index is 30.45 kg/m.  General Appearance: Casual and Fairly Groomed  Eye Contact:  Good  Speech:  Clear and Coherent  Volume:  Normal  Mood:  Euthymic  Affect:  Appropriate and Congruent  Thought Process:  Goal Directed  Orientation:  Full (Time, Place, and Person)  Thought Content: Rumination   Suicidal Thoughts:  No  Homicidal Thoughts:  No  Memory:  Immediate;   Good Recent;   Good Remote;   Fair  Judgement:  Good  Insight:  Fair  Psychomotor Activity:  Normal  Concentration:  Concentration: Fair and Attention Span: Fair  Recall:  Good  Fund of Knowledge: Good  Language: Good  Akathisia:  No  Handed:  Right  AIMS (if indicated): not done  Assets:  Communication Skills Desire for Improvement Resilience Social Support Talents/Skills  ADL's:  Intact  Cognition: WNL  Sleep:  Fair   Screenings: PHQ2-9     Office Visit from 03/14/2018 in [facility] Office Visit from 09/02/2017 in [facility] Visit from 11/23/2016 in [facility] Office Visit from 06/21/2016 in [facility]  PHQ-2 Total Score  4  4  0  4  PHQ-9 Total Score  18  9  -  14       Assessment and Plan: This patient is a 58 year old female with a history of depression anxiety  and posttraumatic stress disorder.  She is doing fairly well except for extremely vivid dreams.  We will increase prazosin to 5 mg at bedtime for this.  She will continue trazodone 300 mg at bedtime for sleep, Valium 10 mg 3 times daily for anxiety, Cymbalta to 60 mg twice daily for depression and Rexulti 1 mg daily for augmentation.  She will return to see me in 3 months   [name], MD 07/21/2018, 2:44 PM

## 2018-08-26 ENCOUNTER — Telehealth: Payer: Self-pay | Admitting: Family Medicine

## 2018-09-15 ENCOUNTER — Telehealth: Payer: Self-pay | Admitting: Family Medicine

## 2018-10-21 ENCOUNTER — Ambulatory Visit (HOSPITAL_COMMUNITY): Payer: Medicare Other | Admitting: Psychiatry

## 2018-10-21 ENCOUNTER — Other Ambulatory Visit: Payer: Self-pay

## 2018-10-21 ENCOUNTER — Telehealth (HOSPITAL_COMMUNITY): Payer: Self-pay | Admitting: Psychiatry

## 2018-12-24 ENCOUNTER — Ambulatory Visit (INDEPENDENT_AMBULATORY_CARE_PROVIDER_SITE_OTHER): Payer: Medicare Other | Admitting: Family Medicine

## 2018-12-24 DIAGNOSIS — R21 Rash and other nonspecific skin eruption: Secondary | ICD-10-CM

## 2018-12-24 DIAGNOSIS — R053 Chronic cough: Secondary | ICD-10-CM

## 2018-12-24 DIAGNOSIS — K529 Noninfective gastroenteritis and colitis, unspecified: Secondary | ICD-10-CM | POA: Diagnosis not present

## 2018-12-24 DIAGNOSIS — J410 Simple chronic bronchitis: Secondary | ICD-10-CM | POA: Diagnosis not present

## 2018-12-24 DIAGNOSIS — R05 Cough: Secondary | ICD-10-CM | POA: Diagnosis not present

## 2018-12-24 DIAGNOSIS — K219 Gastro-esophageal reflux disease without esophagitis: Secondary | ICD-10-CM

## 2018-12-24 MED ORDER — FLOVENT HFA 220 MCG/ACT IN AERO: 2.0000 | INHALATION_SPRAY | Freq: Two times a day (BID) | RESPIRATORY_TRACT | 12 refills | Status: DC

## 2018-12-24 MED ORDER — TRIAMCINOLONE ACETONIDE 0.1 % EX CREA
1.0000 | TOPICAL_CREAM | Freq: Two times a day (BID) | CUTANEOUS | 1 refills | Status: DC
Start: 2018-12-24 — End: 2019-03-30

## 2018-12-24 MED ORDER — ALIGN 4 MG PO CAPS: ORAL_CAPSULE | ORAL | 0 refills | Status: DC

## 2018-12-24 MED ORDER — OMEPRAZOLE 40 MG PO CPDR: 40.0000 mg | DELAYED_RELEASE_CAPSULE | Freq: Every day | ORAL | 0 refills | Status: DC

## 2018-12-24 NOTE — Progress Notes (Signed)
Telephone visit  Subjective: CC: diarrhea/ rash PCP: [name], DO MVH:QIONGE [name] is a 58 y.o. female calls for telephone consult today. Patient provides verbal consent for consult held via phone.  Location of patient: home Location of provider: [facility] Others present for call: none   1.  Diarrhea/GERD Patient reports diarrhea >1 month.  She reports that on her last colonoscopy she was told she had lactose intolerance.  She is totally discontinued consumption of dairy and specifically notes that she stopped drinking milk.  She reports low appetite. She reports lower abdominal pain.  She reports occasional nausea/ vomiting during diarrheal episodes.  Denies hematochezia, hematemesis. She is taking pepto and OTC diarrhea pills daily for symptoms.  She has had varying degrees of relief with these methods.  Additionally, she reports her GERD is not well controlled on omeprazole 20 mg daily.  She is asking for an increased dose.  2. Rash She reports little blisters on the entire pointer finger of the right hand and thumb.  They itch.  She has been using neosporin, antiseptic spray, bleach soaks.  3. ?  COPD Patient reports that she has been told she has COPD in the past and she has been treated with Flovent.  She notes that she is out of her inhaler and has been out for several weeks now.  She reports increased wheezing and cough.  The symptoms are somewhat relieved by albuterol inhaler but were better controlled on the controller inhaler.  She needs refills of this.  ROS: Per HPI  Allergies  Allergen Reactions  . Vicodin [Hydrocodone-Acetaminophen] Nausea Only   Past Medical History:  Diagnosis Date  . Allergy   . Anxiety   . Blood transfusion without reported diagnosis   . Breast cancer ([place])   . Breast cancer ([place]) 04/21/2012   Stage II (T1c N1 M0) grade 3 triple negative breast cancer, right- sided with 1 of 5 positive nodes, status post FEC in a dose dense fashion for 6  cycles followed by radiation therapy by Dr. Sondra Come for her high-risk disease with her initial date of surgery in February 2008.   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 COPD (chronic obstructive pulmonary disease) ([place])   . Depression   . Emphysema of lung ([place])   . GERD (gastroesophageal reflux disease) 12/18/2012  . Headache   . Hyperlipidemia   . Neuropathy   . Osteoporosis   . PONV (postoperative nausea and vomiting)   . PTSD (post-traumatic stress disorder)   . Substance abuse ([place])     Current Outpatient Medications:  .  albuterol (PROVENTIL HFA;VENTOLIN HFA) 108 (90 Base) MCG/ACT inhaler, Inhale 2 puffs into the lungs every 6 (six) hours as needed for wheezing or shortness of breath., Disp: 1 Inhaler, Rfl: 5 .  Brexpiprazole (REXULTI) 1 MG TABS, Take 1 tablet (1 mg total) by mouth daily., Disp: 30 tablet, Rfl: 2 .  cholecalciferol (VITAMIN D) 1000 units tablet, Take 1,000 Units by mouth daily., Disp: , Rfl:  .  clotrimazole-betamethasone (LOTRISONE) cream, Apply 1 application topically 2 (two) times daily. To corner of mouth, Disp: 30 g, Rfl: 0 .  Crisaborole (EUCRISA) 2 % OINT, Apply 1 g topically daily. Apply to affected areas on face daily for eczema., Disp: 60 g, Rfl: 1 .  diazepam (VALIUM) 10 MG tablet, Take 1 tablet (10 mg total) by mouth 3 (three) times daily., Disp: 90 tablet, Rfl: 2 .  DULoxetine (CYMBALTA) 60 MG capsule, Take 1 capsule (60 mg  total) by mouth 2 (two) times daily., Disp: 180 capsule, Rfl: 2 .  fexofenadine (ALLEGRA) 180 MG tablet, Take 1 tablet (180 mg total) by mouth daily as needed for allergies or rhinitis., Disp: 90 tablet, Rfl: 3 .  fluticasone-salmeterol (ADVAIR HFA) 115-21 MCG/ACT inhaler, Inhale 2 puffs into the lungs 2 (two) times daily. (Patient taking differently: Inhale 2 puffs into the lungs 2 (two) times daily as needed (shortness of breath). ), Disp: 1 Inhaler, Rfl: 5 .  Loperamide HCl (ANTI-DIARRHEAL PO), Take by mouth as needed.,  Disp: , Rfl:  .  omeprazole (PRILOSEC) 20 MG capsule, Take 1 capsule (20 mg total) by mouth daily., Disp: 90 capsule, Rfl: 3 .  pravastatin (PRAVACHOL) 20 MG tablet, Take 1 tablet (20 mg total) by mouth at bedtime., Disp: 90 tablet, Rfl: 2 .  prazosin (MINIPRESS) 5 MG capsule, Take 1 capsule (5 mg total) by mouth at bedtime., Disp: 30 capsule, Rfl: 2 .  traZODone (DESYREL) 100 MG tablet, Take 3 tablets (300 mg total) by mouth at bedtime., Disp: 90 tablet, Rfl: 2 .  triamcinolone cream (KENALOG) 0.1 %, Apply 1 application topically 2 (two) times daily. Until rash clears, Disp: 30 g, Rfl: 1 .  vitamin C (ASCORBIC ACID) 500 MG tablet, Take 500 mg by mouth daily., Disp: , Rfl:   Assessment/ Plan: 58 y.o. female   1. Chronic diarrhea Uncertain etiology.  Symptoms are somewhat responsive to OTC methods but she is needing to take these quite frequently.  Judy have prescribed her a probiotic to start.  Viberzi was considered but Judy think that she would warrant evaluation by GI.  Judy question if she needs stool studies.  She has had history of GI bleed in the past and had polyp on last colonoscopy in July. - Probiotic Product (ALIGN) 4 MG CAPS; Take 1 capsule daily.  Dispense: 30 capsule; Refill: 0  2. Gastroesophageal reflux disease, esophagitis presence not specified Uncontrolled.  Omeprazole was increased to 40 mg daily.  Follow-up with GI - omeprazole (PRILOSEC) 40 MG capsule; Take 1 capsule (40 mg total) by mouth daily.  Dispense: 90 capsule; Refill: 0  3. Chronic cough Has been out of her corticosteroid inhaler.  This is been renewed and sent to the pharmacy - fluticasone (FLOVENT HFA) 220 MCG/ACT inhaler; Inhale 2 puffs into the lungs 2 (two) times daily.  Dispense: 1 Inhaler; Refill: 12  4. Simple chronic bronchitis (HCC) As above - fluticasone (FLOVENT HFA) 220 MCG/ACT inhaler; Inhale 2 puffs into the lungs 2 (two) times daily.  Dispense: 1 Inhaler; Refill: 12  5. Rash and nonspecific skin  eruption Uncertain etiology.  Will cover for possible contact dermatitis/eczema.  We discussed that if symptoms do not improve or if they worsen she needs to get checked out to rule out infectious etiology like scabies. - triamcinolone cream (KENALOG) 0.1 %; Apply 1 application topically 2 (two) times daily. Until rash clears (7-10 days)  Dispense: 30 g; Refill: 1   Start time: 1:43pm End time: 1:53pm  Total time spent on patient care (including telephone call/ virtual visit): 20 minutes  [place], [place] [phone]

## 2018-12-24 NOTE — Patient Instructions (Signed)

## 2018-12-30 ENCOUNTER — Ambulatory Visit (HOSPITAL_COMMUNITY): Payer: Medicare Other | Admitting: Psychiatry

## 2018-12-30 ENCOUNTER — Other Ambulatory Visit: Payer: Self-pay

## 2019-01-21 ENCOUNTER — Ambulatory Visit (INDEPENDENT_AMBULATORY_CARE_PROVIDER_SITE_OTHER): Payer: Medicare Other | Admitting: Gastroenterology

## 2019-01-21 ENCOUNTER — Other Ambulatory Visit: Payer: Self-pay

## 2019-01-21 ENCOUNTER — Encounter

## 2019-01-21 ENCOUNTER — Encounter: Payer: Self-pay | Admitting: Gastroenterology

## 2019-01-21 VITALS — BP 113/71 | HR 76 | Temp 97.2°F | Ht 64.0 in | Wt 172.6 lb

## 2019-01-21 DIAGNOSIS — K219 Gastro-esophageal reflux disease without esophagitis: Secondary | ICD-10-CM | POA: Diagnosis not present

## 2019-01-21 DIAGNOSIS — R197 Diarrhea, unspecified: Secondary | ICD-10-CM

## 2019-01-21 MED ORDER — DICYCLOMINE HCL 10 MG PO CAPS: 10.0000 mg | ORAL_CAPSULE | Freq: Two times a day (BID) | ORAL | 3 refills | Status: DC

## 2019-01-21 NOTE — Assessment & Plan Note (Signed)
Chronic. Colonoscopy on file. Diet playing a role in symptoms. Will trial Bentyl BID. May need to increase. Side effects discussed.

## 2019-01-21 NOTE — Patient Instructions (Addendum)
I would like for you to take omeprazole (Prilosec) in the morning on an empty stomach, 30 minutes before breakfast. It is best absorbed on an empty stomach.   Make sure you don't lay down for at least 3 hours after eating.   You can take Pepcid at night as needed if severe reflux.  For loose stools and cramping: let's try dicyclomine (Bentyl) twice a day. This can cause dry mouth, dizziness, constipation. This can be increased up to 4 times a day, but call us with an update before increasing.  Please let us know how you are doing in a few weeks!  We will see you in 3-4 months!  It was a pleasure to see you today. I want to create trusting relationships with patients to provide genuine, compassionate, and quality care. I value your feedback. If you receive a survey regarding your visit,  I greatly appreciate you taking time to fill this out.   Annitta Needs, PhD, ANP-BC [facility]    Gastroesophageal Reflux Disease, Adult Gastroesophageal reflux (GER) happens when acid from the stomach flows up into the tube that connects the mouth and the stomach (esophagus). Normally, food travels down the esophagus and stays in the stomach to be digested. However, when a person has GER, food and stomach acid sometimes move back up into the esophagus. If this becomes a more serious problem, the person may be diagnosed with a disease called gastroesophageal reflux disease (GERD). GERD occurs when the reflux:  Happens often.  Causes frequent or severe symptoms.  Causes problems such as damage to the esophagus. When stomach acid comes in contact with the esophagus, the acid may cause soreness (inflammation) in the esophagus. Over time, GERD may create small holes (ulcers) in the lining of the esophagus. What are the causes? This condition is caused by a problem with the muscle between the esophagus and the stomach (lower esophageal sphincter, or LES). Normally, the LES muscle closes after  food passes through the esophagus to the stomach. When the LES is weakened or abnormal, it does not close properly, and that allows food and stomach acid to go back up into the esophagus. The LES can be weakened by certain dietary substances, medicines, and medical conditions, including:  Tobacco use.  Pregnancy.  Having a hiatal hernia.  Alcohol use.  Certain foods and beverages, such as coffee, chocolate, onions, and peppermint. What increases the risk? You are more likely to develop this condition if you:  Have an increased body weight.  Have a connective tissue disorder.  Use NSAID medicines. What are the signs or symptoms? Symptoms of this condition include:  Heartburn.  Difficult or painful swallowing.  The feeling of having a lump in the throat.  Abitter taste in the mouth.  Bad breath.  Having a large amount of saliva.  Having an upset or bloated stomach.  Belching.  Chest pain. Different conditions can cause chest pain. Make sure you see your health care provider if you experience chest pain.  Shortness of breath or wheezing.  Ongoing (chronic) cough or a night-time cough.  Wearing away of tooth enamel.  Weight loss. How is this diagnosed? Your health care provider will take a medical history and perform a physical exam. To determine if you have mild or severe GERD, your health care provider may also monitor how you respond to treatment. You may also have tests, including:  A test to examine your stomach and esophagus with a small camera (endoscopy).  A  test thatmeasures the acidity level in your esophagus.  A test thatmeasures how much pressure is on your esophagus.  A barium swallow or modified barium swallow test to show the shape, size, and functioning of your esophagus. How is this treated? The goal of treatment is to help relieve your symptoms and to prevent complications. Treatment for this condition may vary depending on how severe your  symptoms are. Your health care provider may recommend:  Changes to your diet.  Medicine.  Surgery. Follow these instructions at home: Eating and drinking   Follow a diet as recommended by your health care provider. This may involve avoiding foods and drinks such as: ? Coffee and tea (with or without caffeine). ? Drinks that containalcohol. ? Energy drinks and sports drinks. ? Carbonated drinks or sodas. ? Chocolate and cocoa. ? Peppermint and mint flavorings. ? Garlic and onions. ? Horseradish. ? Spicy and acidic foods, including peppers, chili powder, curry powder, vinegar, hot sauces, and barbecue sauce. ? Citrus fruit juices and citrus fruits, such as oranges, lemons, and limes. ? Tomato-based foods, such as red sauce, chili, salsa, and pizza with red sauce. ? Fried and fatty foods, such as donuts, french fries, potato chips, and high-fat dressings. ? High-fat meats, such as hot dogs and fatty cuts of red and white meats, such as rib eye steak, sausage, ham, and bacon. ? High-fat dairy items, such as whole milk, butter, and cream cheese.  Eat small, frequent meals instead of large meals.  Avoid drinking large amounts of liquid with your meals.  Avoid eating meals during the 2-3 hours before bedtime.  Avoid lying down right after you eat.  Do not exercise right after you eat. Lifestyle   Do not use any products that contain nicotine or tobacco, such as cigarettes, e-cigarettes, and chewing tobacco. If you need help quitting, ask your health care provider.  Try to reduce your stress by using methods such as yoga or meditation. If you need help reducing stress, ask your health care provider.  If you are overweight, reduce your weight to an amount that is healthy for you. Ask your health care provider for guidance about a safe weight loss goal. General instructions  Pay attention to any changes in your symptoms.  Take over-the-counter and prescription medicines only as  told by your health care provider. Do not take aspirin, ibuprofen, or other NSAIDs unless your health care provider told you to do so.  Wear loose-fitting clothing. Do not wear anything tight around your waist that causes pressure on your abdomen.  Raise (elevate) the head of your bed about 6 inches (15 cm).  Avoid bending over if this makes your symptoms worse.  Keep all follow-up visits as told by your health care provider. This is important. Contact a health care provider if:  You have: ? New symptoms. ? Unexplained weight loss. ? Difficulty swallowing or it hurts to swallow. ? Wheezing or a persistent cough. ? A hoarse voice.  Your symptoms do not improve with treatment. Get help right away if you:  Have pain in your arms, neck, jaw, teeth, or back.  Feel sweaty, dizzy, or light-headed.  Have chest pain or shortness of breath.  Vomit and your vomit looks like blood or coffee grounds.  Faint.  Have stool that is bloody or black.  Cannot swallow, drink, or eat. Summary  Gastroesophageal reflux happens when acid from the stomach flows up into the esophagus. GERD is a disease in which the reflux happens  often, causes frequent or severe symptoms, or causes problems such as damage to the esophagus.  Treatment for this condition may vary depending on how severe your symptoms are. Your health care provider may recommend diet and lifestyle changes, medicine, or surgery.  Contact a health care provider if you have new or worsening symptoms.  Take over-the-counter and prescription medicines only as told by your health care provider. Do not take aspirin, ibuprofen, or other NSAIDs unless your health care provider told you to do so.  Keep all follow-up visits as told by your health care provider. This is important. This information is not intended to replace advice given to you by your health care provider. Make sure you discuss any questions you have with your health care  provider. Document Released: 01/31/2005 Document Revised: 10/30/2017 Document Reviewed: 10/30/2017 Elsevier Patient Education  2020 [employer].

## 2019-01-21 NOTE — Assessment & Plan Note (Signed)
Exacerbated recently due to dietary choices, late night eating. Taking PPI with food, which will decrease efficacy. Discussed limiting pepsi, avoiding laying down at least 3 hours after eating, taking PPI on empty stomach in the morning. GERD diet provided. She may benefit from changing PPIs as she has been on omeprazole chronically. Progress report in a few weeks. Return in 3-4 months.

## 2019-01-21 NOTE — Progress Notes (Signed)
Referring Provider: [name], DO Primary Care Physician:  [name], DO Primary GI: Dr. Oneida Alar   Chief Complaint  Patient presents with  . Diarrhea    had episode yesterday, today bowels more loose than runny  . Gastroesophageal Reflux    burning in throat    HPI:   [name] is a 58 y.o. female presenting today with a history of diarrhea, gastritis, duodenitis, last seen in Nov 2019. Surveillance colonoscopy due between 2024 and 2029.   Avoids dairy except for cheese. Loose stool will hit her in the middle of the night. Taking pepto bismol as needed. Will cough and sometimes use bathroom on her self. If a bad day of diarrhea, will have 4-5 times per day. Lower abdominal discomfort and sometimes upper abdominal discomfort/cramping.   GERD: burning in throat. Taking Prilosec at night while eating. Eating late at times. One cup of coffee in morning. Drinks 4 cans of pepsi a day.    Past Medical History:  Diagnosis Date  . Allergy   . Anxiety   . Blood transfusion without reported diagnosis   . Breast cancer ([place])   . Breast cancer ([place]) 04/21/2012   Stage II (T1c N1 M0) grade 3 triple negative breast cancer, right- sided with 1 of 5 positive nodes, status post FEC in a dose dense fashion for 6 cycles followed by radiation therapy by Dr. Sondra Come for her high-risk disease with her initial date of surgery in February 2008.   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 COPD (chronic obstructive pulmonary disease) ([place])   . Depression   . Emphysema of lung ([place])   . GERD (gastroesophageal reflux disease) 12/18/2012  . Headache   . Hyperlipidemia   . Neuropathy   . Osteoporosis   . PONV (postoperative nausea and vomiting)   . PTSD (post-traumatic stress disorder)   . Substance abuse [facility])     Past Surgical History:  Procedure Laterality Date  . BIOPSY  11/05/2017   Procedure: BIOPSY;  Surgeon: Danie Binder, MD;  Location: [facility] ENDO  SUITE;  Service: Endoscopy;;  colon duodenum gastric  . BREAST SURGERY    . CATARACT EXTRACTION W/PHACO Left 08/15/2017   Procedure: CATARACT EXTRACTION WITH PHACOEMULSIFICATION  AND INTRAOCULAR LENS PLACEMENT LEFT EYE CDE=2.68;  Surgeon: Baruch Goldmann, MD;  Location: [facility] ORS;  Service: Ophthalmology;  Laterality: Left;  left  . CATARACT EXTRACTION W/PHACO Right 09/06/2017   Procedure: CATARACT EXTRACTION PHACO AND INTRAOCULAR LENS PLACEMENT (IOC);  Surgeon: Baruch Goldmann, MD;  Location: [facility] ORS;  Service: Ophthalmology;  Laterality: Right;  CDE: 1.61  . COLONOSCOPY WITH PROPOFOL N/A 11/05/2017    6 mm polyp in the sigmoid colon which was sessile and removed.  Rectosigmoid colon and sigmoid colon biopsies taken for evaluation for microscopic colitis.  Intermittent rectal bleeding due to sigmoid colon polyp and internal hemorrhoids. Normal colonic biopsies. Tubular adenoma. Repeat surveillance 5-10 years.   . ESOPHAGOGASTRODUODENOSCOPY  03/10/2008   SLF: Normal esophagus without evidence of Barrett, mass, erosion  ulceration, or stricture, small bowel bx negative, gastritis with NO h.pylori  . ESOPHAGOGASTRODUODENOSCOPY (EGD) WITH PROPOFOL N/A 11/05/2017   Mild gastritis and duodenitis.   [name] leocolonoscopy  03/10/2008   ON:[id number] terminal ileum, approximately 10 cm visualized/Normal colon without evidence of polyps, mass, inflammatory changes, diverticula, or AVMs/Normal retroflexed view of the rectum. random colon bx negative.  [name] MASTECTOMY     bilateral, status post reconstruction.  [name] POLYPECTOMY  11/05/2017   Procedure: POLYPECTOMY;  Surgeon: Danie Binder, MD;  Location: [facility] END[facility] SUITE;  Service: Endoscopy;;  colon  . TUBAL LIGATION      Current Outpatient Medications  Medication Sig Dispense Refill  . albuterol (PROVENTIL HFA;VENTOLIN HFA) 108 (90 Base) MCG/ACT inhaler Inhale 2 puffs into the lungs every 6 (six) hours as needed for wheezing or shortness of breath. 1 Inhaler 5  . Brexpiprazole  (REXULTI) 1 MG TABS Take 1 tablet (1 mg total) by mouth daily. 30 tablet 2  . cholecalciferol (VITAMIN D) 1000 units tablet Take 1,000 Units by mouth daily.    . diazepam (VALIUM) 10 MG tablet Take 1 tablet (10 mg total) by mouth 3 (three) times daily. 90 tablet 2  . DULoxetine (CYMBALTA) 60 MG capsule Take 1 capsule (60 mg total) by mouth 2 (two) times daily. 180 capsule 2  . fexofenadine (ALLEGRA) 180 MG tablet Take 1 tablet (180 mg total) by mouth daily as needed for allergies or rhinitis. 90 tablet 3  . fluticasone (FLOVENT HFA) 220 MCG/ACT inhaler Inhale 2 puffs into the lungs 2 (two) times daily. 1 Inhaler 12  . Loperamide HCl (ANTI-DIARRHEAL PO) Take by mouth as needed.    [name] omeprazole (PRILOSEC) 40 MG capsule Take 1 capsule (40 mg total) by mouth daily. 90 capsule 0  . pravastatin (PRAVACHOL) 20 MG tablet Take 1 tablet (20 mg total) by mouth at bedtime. 90 tablet 2  . prazosin (MINIPRESS) 5 MG capsule Take 1 capsule (5 mg total) by mouth at bedtime. 30 capsule 2  . Probiotic Product (ALIGN) 4 MG CAPS Take 1 capsule daily. 30 capsule 0  . traZODone (DESYREL) 100 MG tablet Take 3 tablets (300 mg total) by mouth at bedtime. 90 tablet 2  . triamcinolone cream (KENALOG) 0.1 % Apply 1 application topically 2 (two) times daily. Until rash clears (7-10 days) 30 g 1  . vitamin C (ASCORBIC ACID) 500 MG tablet Take 500 mg by mouth daily.     No current facility-administered medications for this visit.     Allergies as of 01/21/2019 - Review Complete 01/21/2019  Allergen Reaction Noted  . Vicodin [hydrocodone-acetaminophen] Nausea Only 07/15/2013    Family History  Problem Relation Age of Onset  . COPD Mother   . Depression Mother   . Heart disease Mother   . Hyperlipidemia Mother   . Hypertension Mother   . Cancer Mother        lung cancer, to bone  . Cancer Father        skin  . Heart disease Father   . Hypertension Father   . Hyperlipidemia Father   . Learning disabilities Father    . Drug abuse Brother        overdose at 22  . Drug abuse Daughter        drug overdose 23  . Cancer Maternal Uncle        lung cancer  . Colon cancer Neg Hx     Social History   Socioeconomic History  . Marital status: Divorced    Spouse name: Not on file  . Number of children: 2  . Years of education: 13  . Highest education level: Not on file  Occupational History  . Occupation: disabled    Comment: neuropathy from [place]  . Financial resource strain: Not on file  . Food insecurity    Worry: Not on file    Inability: Not on file  .  Transportation needs    Medical: Not on file    Non-medical: Not on file  Tobacco Use  . Smoking status: Current Every Day Smoker    Packs/day: 0.50    Years: 20.00    Pack years: 10.00    Types: Cigarettes    Start date: 05/07/1992  . Smokeless tobacco: Never Used  . Tobacco comment: discussed  Substance and Sexual Activity  . Alcohol use: Yes    Alcohol/week: 6.0 standard drinks    Types: 6 Shots of liquor per week    Comment: occ  . Drug use: No  . Sexual activity: Yes    Birth control/protection: Post-menopausal  Lifestyle  . Physical activity    Days per week: Not on file    Minutes per session: Not on file  . Stress: Not on file  Relationships  . Social Herbalist on phone: Not on file    Gets together: Not on file    Attends religious service: Not on file    Active member of club or organization: Not on file    Attends meetings of clubs or organizations: Not on file    Relationship status: Not on file  Other Topics Concern  . Not on file  Social History Narrative   Divorced   Lives alone   Spends time with boyfriend - he is a smoker    Review of Systems: Gen: Denies fever, chills, anorexia. Denies fatigue, weakness, weight loss.  CV: Denies chest pain, palpitations, syncope, peripheral edema, and claudication. Resp: Denies dyspnea at rest, cough, wheezing, coughing up blood, and pleurisy.  GI:see HPI Derm: Denies rash, itching, dry skin Psych: Denies depression, anxiety, memory loss, confusion. No homicidal or suicidal ideation.  Heme: Denies bruising, bleeding, and enlarged lymph nodes.  Physical Exam: BP 113/71   Pulse 76   Temp (!) 97.2 F (36.2 C) (Oral)   Ht 5\' 4"  (1.626 m)   Wt 172 lb 9.6 oz (78.3 kg)   LMP 07/09/2013   BMI 29.63 kg/m  General:   Alert and oriented. No distress noted. Pleasant and cooperative.  Head:  Normocephalic and atraumatic. Abdomen:  +BS, soft, mild TTP lower abdomen and non-distended. No rebound or guarding. No HSM or masses noted. Msk:  Symmetrical without gross deformities. Normal posture. Extremities:  Without edema. Neurologic:  Alert and  oriented x4 Psych:  Alert and cooperative. Normal mood and affect.

## 2019-01-25 NOTE — Progress Notes (Signed)
CC'ED TO PCP 

## 2019-03-04 ENCOUNTER — Other Ambulatory Visit (HOSPITAL_COMMUNITY): Payer: Self-pay | Admitting: Psychiatry

## 2019-03-04 ENCOUNTER — Other Ambulatory Visit: Payer: Self-pay | Admitting: Family Medicine

## 2019-03-04 NOTE — Telephone Encounter (Signed)
Call pt for appt, she missed last one

## 2019-03-04 NOTE — Telephone Encounter (Signed)
Per provider: Call pt for appt, she missed last one. Patient made appt 03/24/2019

## 2019-03-09 ENCOUNTER — Other Ambulatory Visit (HOSPITAL_COMMUNITY): Payer: Self-pay | Admitting: Psychiatry

## 2019-03-24 ENCOUNTER — Encounter (HOSPITAL_COMMUNITY): Payer: Self-pay | Admitting: Psychiatry

## 2019-03-24 ENCOUNTER — Ambulatory Visit (INDEPENDENT_AMBULATORY_CARE_PROVIDER_SITE_OTHER): Payer: Medicare Other | Admitting: Psychiatry

## 2019-03-24 ENCOUNTER — Other Ambulatory Visit: Payer: Self-pay

## 2019-03-24 DIAGNOSIS — F332 Major depressive disorder, recurrent severe without psychotic features: Secondary | ICD-10-CM | POA: Diagnosis not present

## 2019-03-24 MED ORDER — DIAZEPAM 10 MG PO TABS: 10.0000 mg | ORAL_TABLET | Freq: Three times a day (TID) | ORAL | 2 refills | Status: DC

## 2019-03-24 MED ORDER — DULOXETINE HCL 60 MG PO CPEP: 60.0000 mg | ORAL_CAPSULE | Freq: Two times a day (BID) | ORAL | 2 refills | Status: DC

## 2019-03-24 MED ORDER — PRAZOSIN HCL 5 MG PO CAPS: 5.0000 mg | ORAL_CAPSULE | Freq: Every day | ORAL | 2 refills | Status: DC

## 2019-03-24 MED ORDER — TEMAZEPAM 15 MG PO CAPS: 15.0000 mg | ORAL_CAPSULE | Freq: Every evening | ORAL | 2 refills | Status: DC | PRN

## 2019-03-24 NOTE — Progress Notes (Signed)
Virtual Visit via Telephone Note  I connected with [name] on 03/24/19 at  1:20 PM EST by telephone and verified that I am speaking with the correct person using two identifiers.   I discussed the limitations, risks, security and privacy concerns of performing an evaluation and management service by telephone and the availability of in person appointments. I also discussed with the patient that there may be a patient responsible charge related to this service. The patient expressed understanding and agreed to proceed.   I discussed the assessment and treatment plan with the patient. The patient was provided an opportunity to ask questions and all were answered. The patient agreed with the plan and demonstrated an understanding of the instructions.   The patient was advised to call back or seek an in-person evaluation if the symptoms worsen or if the condition fails to improve as anticipated.  I provided 15 minutes of non-face-to-face time during this encounter.   [name], MD  [facility] MD/PA/NP OP Progress Note  03/24/2019 1:40 PM [name]  MRN:  [id number]  Chief Complaint:  Chief Complaint    Depression; Anxiety; Follow-up     HPI: this patientis a 58 year old divorced white female [place]. She has one son age 2. She had a daughter who died at age 11 in 2011-08-19 of a narcotic overdose. The patient is on disability. She is self-referred.  The patient states that she began getting depressed in her early 39s. She admits to suicide attempts but was never hospitalized but was treated in outpatient clinics. She's been on numerous medications over the years including Celexa, Abilify, Zoloft, amitriptyline, clonazepam and Xanax. She used to go to [place] and families followed by a clinic in [place] and most recently was prescribed medication by her physician at [facility] family [facility]. Her physician left and she no longer has access to psychiatric  medications. She was on a combination of Celexa, Abilify and clonazepam. She's been off medication for 6 months.  The patient states that her depression got considerably worse after her daughter died in 19-Aug-2011. The patient witnessed her daughter's death and did not knowwhat was going on. She watched her get drowsy through the entire day but didn't realize that she was going through an overdose. By the time she called 911 it was too late. The patient blames herself for her daughter's death. She has nightmares and flashbacks about the events of that day and can't stop thinking about that. She has not had any counseling since her daughter died.  Currently she cries all the time and has no energy. She does not eat well. She is frightened by the house and has constant panic attacks. Her sleep is disrupted by nightmares. She does not hear voices but sometimes hears noises. She's been trying to cope by drinking several shots of alcohol every few days. She claims she used to smoke marijuana but hasn't for one month. She does not use other drugs. She has a boyfriend but is isolating herself from him and does not do much with other friends or family members. She has passive suicidal ideation and "wishes I would die and be with my daughter" but denies any thoughts of self-harm. \  The patient returns for follow-up after a long absence.  She has missed some appointments and has not been seen for 8 months.  She states that she had to "wean herself off" the trazodone and she went through terrible withdrawal.  However now she  is not sleeping well.  She is enrolled in a university program online through post [employer] and is studying counseling.  She states that it is making her extremely anxious and nervous.  She is going to try to finish out this semester but then is not going to go back.  I suggested that she try a local college such as [employer] in the future.  She denies severe depression but states  she is anxious and cannot sleep.  She does not want to go back on trazodone so we can try a low dose of Restoril. Visit Diagnosis:    ICD-10-CM   1. Major depressive disorder, recurrent, severe without psychotic features ([place])  F33.2     Past Psychiatric History: Past outpatient treatment  Past Medical History:  Past Medical History:  Diagnosis Date  . Allergy   . Anxiety   . Blood transfusion without reported diagnosis   . Breast cancer ([place])   . Breast cancer ([place]) 04/21/2012   Stage II (T1c N1 M0) grade 3 triple negative breast cancer, right- sided with 1 of 5 positive nodes, status post FEC in a dose dense fashion for 6 cycles followed by radiation therapy by Dr. Sondra Come for her high-risk disease with her initial date of surgery in February 2008.   [name] Chemotherapy-induced neuropathy ([place]) 06/21/2016  . COPD (chronic obstructive pulmonary disease) ([place])   . Depression   . Emphysema of lung ([place])   . GERD (gastroesophageal reflux disease) 12/18/2012  . Headache   . Hyperlipidemia   . Neuropathy   . Osteoporosis   . PONV (postoperative nausea and vomiting)   . PTSD (post-traumatic stress disorder)   . Substance abuse [facility])     Past Surgical History:  Procedure Laterality Date  . BIOPSY  11/05/2017   Procedure: BIOPSY;  Surgeon: Danie Binder, MD;  Location: [facility];  Service: Endoscopy;;  colon duodenum gastric  . BREAST SURGERY    . CATARACT EXTRACTION W/PHACO Left 08/15/2017   Procedure: CATARACT EXTRACTION WITH PHACOEMULSIFICATION  AND INTRAOCULAR LENS PLACEMENT LEFT EYE CDE=2.68;  Surgeon: Baruch Goldmann, MD;  Location: [facility] ORS;  Service: Ophthalmology;  Laterality: Left;  left  . CATARACT EXTRACTION W/PHACO Right 09/06/2017   Procedure: CATARACT EXTRACTION PHACO AND INTRAOCULAR LENS PLACEMENT (IOC);  Surgeon: Baruch Goldmann, MD;  Location: [facility] ORS;  Service: Ophthalmology;  Laterality: Right;  CDE: 1.61  . COLONOSCOPY WITH PROPOFOL N/A 11/05/2017    6 mm polyp in the  sigmoid colon which was sessile and removed.  Rectosigmoid colon and sigmoid colon biopsies taken for evaluation for microscopic colitis.  Intermittent rectal bleeding due to sigmoid colon polyp and internal hemorrhoids. Normal colonic biopsies. Tubular adenoma. Repeat surveillance 5-10 years.   . ESOPHAGOGASTRODUODENOSCOPY  03/10/2008   SLF: Normal esophagus without evidence of Barrett, mass, erosion  ulceration, or stricture, small bowel bx negative, gastritis with NO h.pylori  . ESOPHAGOGASTRODUODENOSCOPY (EGD) WITH PROPOFOL N/A 11/05/2017   Mild gastritis and duodenitis.   [name] leocolonoscopy  03/10/2008   [id number] terminal ileum, approximately 10 cm visualized/Normal colon without evidence of polyps, mass, inflammatory changes, diverticula, or AVMs/Normal retroflexed view of the rectum. random colon bx negative.  [name] MASTECTOMY     bilateral, status post reconstruction.  [name] POLYPECTOMY  11/05/2017   Procedure: POLYPECTOMY;  Surgeon: Danie Binder, MD;  Location: [facility];  Service: Endoscopy;;  colon  . TUBAL LIGATION      Family Psychiatric History: see below  Family History:  Family History  Problem Relation Age of Onset  . COPD Mother   . Depression Mother   . Heart disease Mother   . Hyperlipidemia Mother   . Hypertension Mother   . Cancer Mother        lung cancer, to bone  . Cancer Father        skin  . Heart disease Father   . Hypertension Father   . Hyperlipidemia Father   . Learning disabilities Father   . Drug abuse Brother        overdose at 106  . Drug abuse Daughter        drug overdose 45  . Cancer Maternal Uncle        lung cancer  . Colon cancer Neg Hx     Social History:  Social History   Socioeconomic History  . Marital status: Divorced    Spouse name: Not on file  . Number of children: 2  . Years of education: 80  . Highest education level: Not on file  Occupational History  . Occupation: disabled    Comment: neuropathy from [place]  . Financial resource strain: Not on file  . Food insecurity    Worry: Not on file    Inability: Not on file  . Transportation needs    Medical: Not on file    Non-medical: Not on file  Tobacco Use  . Smoking status: Current Every Day Smoker    Packs/day: 0.50    Years: 20.00    Pack years: 10.00    Types: Cigarettes    Start date: 05/07/1992  . Smokeless tobacco: Never Used  . Tobacco comment: discussed  Substance and Sexual Activity  . Alcohol use: Yes    Alcohol/week: 6.0 standard drinks    Types: 6 Shots of liquor per week    Comment: occ  . Drug use: No  . Sexual activity: Yes    Birth control/protection: Post-menopausal  Lifestyle  . Physical activity    Days per week: Not on file    Minutes per session: Not on file  . Stress: Not on file  Relationships  . Social Herbalist on phone: Not on file    Gets together: Not on file    Attends religious service: Not on file    Active member of club or organization: Not on file    Attends meetings of clubs or organizations: Not on file    Relationship status: Not on file  Other Topics Concern  . Not on file  Social History Narrative   Divorced   Lives alone   Spends time with boyfriend - he is a smoker    Allergies:  Allergies  Allergen Reactions  . Vicodin [Hydrocodone-Acetaminophen] Nausea Only    Metabolic Disorder Labs: No results found for: HGBA1C, MPG No results found for: PROLACTIN Lab Results  Component Value Date   CHOL 177 07/08/2017   TRIG 120 07/08/2017   HDL 52 07/08/2017   CHOLHDL 3.4 07/08/2017   VLDL 42 (H) 11/23/2016   LDLCALC 103 (H) 07/08/2017   LDLCALC 120 (H) 11/23/2016   Lab Results  Component Value Date   TSH 2.26 11/29/2015   TSH 1.197 03/28/2010    Therapeutic Level Labs: No results found for: LITHIUM No results found for: VALPROATE No components found for:  CBMZ  Current Medications: Current Outpatient Medications  Medication Sig Dispense Refill  .  albuterol (PROVENTIL HFA;VENTOLIN HFA) 108 (90 Base)  MCG/ACT inhaler Inhale 2 puffs into the lungs every 6 (six) hours as needed for wheezing or shortness of breath. 1 Inhaler 5  . cholecalciferol (VITAMIN D) 1000 units tablet Take 1,000 Units by mouth daily.    . diazepam (VALIUM) 10 MG tablet Take 1 tablet (10 mg total) by mouth 3 (three) times daily. 90 tablet 2  . dicyclomine (BENTYL) 10 MG capsule Take 1 capsule (10 mg total) by mouth 2 (two) times daily. 120 capsule 3  . DULoxetine (CYMBALTA) 60 MG capsule Take 1 capsule (60 mg total) by mouth 2 (two) times daily. 180 capsule 2  . fexofenadine (ALLEGRA) 180 MG tablet Take 1 tablet (180 mg total) by mouth daily as needed for allergies or rhinitis. 90 tablet 3  . fluticasone (FLOVENT HFA) 220 MCG/ACT inhaler Inhale 2 puffs into the lungs 2 (two) times daily. 1 Inhaler 12  . Loperamide HCl (ANTI-DIARRHEAL PO) Take by mouth as needed.    [name] omeprazole (PRILOSEC) 40 MG capsule Take 1 capsule (40 mg total) by mouth daily. 90 capsule 0  . pravastatin (PRAVACHOL) 20 MG tablet Take 1 tablet (20 mg total) by mouth at bedtime. (Needs to be seen before next refill) 30 tablet 0  . prazosin (MINIPRESS) 5 MG capsule Take 1 capsule (5 mg total) by mouth at bedtime. 30 capsule 2  . Probiotic Product (ALIGN) 4 MG CAPS Take 1 capsule daily. 30 capsule 0  . temazepam (RESTORIL) 15 MG capsule Take 1 capsule (15 mg total) by mouth at bedtime as needed for sleep. 30 capsule 2  . triamcinolone cream (KENALOG) 0.1 % Apply 1 application topically 2 (two) times daily. Until rash clears (7-10 days) 30 g 1  . vitamin C (ASCORBIC ACID) 500 MG tablet Take 500 mg by mouth daily.     No current facility-administered medications for this visit.      Musculoskeletal: Strength & Muscle Tone: within normal limits Gait & Station: normal Patient leans: N/A  Psychiatric Specialty Exam: Review of Systems  Gastrointestinal: Positive for heartburn.  Psychiatric/Behavioral:  The patient is nervous/anxious and has insomnia.   All other systems reviewed and are negative.   Last menstrual period 07/09/2013.There is no height or weight on file to calculate BMI.  General Appearance: NA  Eye Contact:  NA  Speech:  Clear and Coherent  Volume:  Normal  Mood:  Anxious  Affect:  NA  Thought Process:  Goal Directed  Orientation:  Full (Time, Place, and Person)  Thought Content: Rumination   Suicidal Thoughts:  No  Homicidal Thoughts:  No  Memory:  Immediate;   Good Recent;   Good Remote;   Fair  Judgement:  Good  Insight:  Fair  Psychomotor Activity:  Normal  Concentration:  Concentration: Good and Attention Span: Good  Recall:  Good  Fund of Knowledge: Good  Language: Good  Akathisia:  No  Handed:  Right  AIMS (if indicated): not done  Assets:  Communication Skills Desire for Improvement Resilience Social Support Talents/Skills  ADL's:  Intact  Cognition: WNL  Sleep:  Poor   Screenings: PHQ2-9     Office Visit from 03/14/2018 in [facility] Office Visit from 09/02/2017 in [facility] Visit from 11/23/2016 in [facility] Office Visit from 06/21/2016 in [facility]  PHQ-2 Total Score  4  4  0  4  PHQ-9 Total Score  18  9  -  14       Assessment  and Plan: This patient is a 58 year old female with a history of depression anxiety and posttraumatic stress disorder.  She has been off her sleeping medication for quite some time and is not sleeping well.  We will start Restoril 15 mg at bedtime for sleep.  She will continue prazosin 5 mg at bedtime for nightmares, Valium 10 mg 3 times daily for anxiety and Cymbalta 60 mg twice daily for depression.  She has stopped Rexulti as it was not helpful and she could not afford it.  She will return to see me in 2 months   [name], MD 03/24/2019, 1:40 PM

## 2019-03-27 ENCOUNTER — Other Ambulatory Visit: Payer: Self-pay

## 2019-03-27 ENCOUNTER — Telehealth: Payer: Self-pay | Admitting: Family Medicine

## 2019-03-27 ENCOUNTER — Other Ambulatory Visit: Payer: Self-pay | Admitting: Family Medicine

## 2019-03-27 DIAGNOSIS — R21 Rash and other nonspecific skin eruption: Secondary | ICD-10-CM

## 2019-03-27 NOTE — Telephone Encounter (Signed)
Pt called and aware of referral

## 2019-03-27 NOTE — Telephone Encounter (Signed)
This was noted in AUG when pt had appt  - is there anything she can try?

## 2019-03-27 NOTE — Telephone Encounter (Signed)
She has failed corticosteroids and OTC remedies.  Referral placed to derm.

## 2019-03-30 ENCOUNTER — Other Ambulatory Visit: Payer: Self-pay | Admitting: Family Medicine

## 2019-03-30 ENCOUNTER — Ambulatory Visit: Payer: Medicare Other | Admitting: Family Medicine

## 2019-03-30 DIAGNOSIS — R21 Rash and other nonspecific skin eruption: Secondary | ICD-10-CM

## 2019-03-31 ENCOUNTER — Ambulatory Visit (INDEPENDENT_AMBULATORY_CARE_PROVIDER_SITE_OTHER): Payer: Medicare Other | Admitting: Family Medicine

## 2019-03-31 DIAGNOSIS — F17210 Nicotine dependence, cigarettes, uncomplicated: Secondary | ICD-10-CM

## 2019-03-31 DIAGNOSIS — Z720 Tobacco use: Secondary | ICD-10-CM | POA: Diagnosis not present

## 2019-03-31 NOTE — Progress Notes (Signed)
Telephone visit  Subjective: CC: discuss CT PCP: [name], DO [id number] I [place] is a 58 y.o. female calls for telephone consult today. Patient provides verbal consent for consult held via phone.  Location of patient: home Location of provider: Working remotely from home Others present for call: none  1.  Lung cancer screening Patient reports that she had a home care visit with [facility] and they recommended CT chest for lung cancer screening.  She denies any hemoptysis, change in exercise tolerance, unplanned weight loss.  She does have a chronic smoker's cough that is unchanged.  She has known emphysema.  She reports smoking 1 pack/day since age 34.  Her mother died of lung cancer.  ROS: Per HPI  Allergies  Allergen Reactions  . Vicodin [Hydrocodone-Acetaminophen] Nausea Only   Past Medical History:  Diagnosis Date  . Allergy   . Anxiety   . Blood transfusion without reported diagnosis   . Breast cancer ([place])   . Breast cancer ([place]) 04/21/2012   Stage II (T1c N1 M0) grade 3 triple negative breast cancer, right- sided with 1 of 5 positive nodes, status post FEC in a dose dense fashion for 6 cycles followed by radiation therapy by Dr. Sondra Come for her high-risk disease with her initial date of surgery in February 2008.   [name] Chemotherapy-induced neuropathy ([place]) 06/21/2016  . COPD (chronic obstructive pulmonary disease) ([place])   . Depression   . Emphysema of lung ([place])   . GERD (gastroesophageal reflux disease) 12/18/2012  . Headache   . Hyperlipidemia   . Neuropathy   . Osteoporosis   . PONV (postoperative nausea and vomiting)   . PTSD (post-traumatic stress disorder)   . Substance abuse ([place])     Current Outpatient Medications:  .  albuterol (PROVENTIL HFA;VENTOLIN HFA) 108 (90 Base) MCG/ACT inhaler, Inhale 2 puffs into the lungs every 6 (six) hours as needed for wheezing or shortness of breath., Disp: 1 Inhaler, Rfl: 5 .  cholecalciferol (VITAMIN D) 1000 units  tablet, Take 1,000 Units by mouth daily., Disp: , Rfl:  .  diazepam (VALIUM) 10 MG tablet, Take 1 tablet (10 mg total) by mouth 3 (three) times daily., Disp: 90 tablet, Rfl: 2 .  dicyclomine (BENTYL) 10 MG capsule, Take 1 capsule (10 mg total) by mouth 2 (two) times daily., Disp: 120 capsule, Rfl: 3 .  DULoxetine (CYMBALTA) 60 MG capsule, Take 1 capsule (60 mg total) by mouth 2 (two) times daily., Disp: 180 capsule, Rfl: 2 .  fexofenadine (ALLEGRA) 180 MG tablet, Take 1 tablet (180 mg total) by mouth daily as needed for allergies or rhinitis., Disp: 90 tablet, Rfl: 3 .  fluticasone (FLOVENT HFA) 220 MCG/ACT inhaler, Inhale 2 puffs into the lungs 2 (two) times daily., Disp: 1 Inhaler, Rfl: 12 .  Loperamide HCl (ANTI-DIARRHEAL PO), Take by mouth as needed., Disp: , Rfl:  .  omeprazole (PRILOSEC) 40 MG capsule, Take 1 capsule (40 mg total) by mouth daily., Disp: 90 capsule, Rfl: 0 .  pravastatin (PRAVACHOL) 20 MG tablet, Take 1 tablet (20 mg total) by mouth at bedtime. (Needs to be seen before next refill), Disp: 30 tablet, Rfl: 0 .  prazosin (MINIPRESS) 5 MG capsule, Take 1 capsule (5 mg total) by mouth at bedtime., Disp: 30 capsule, Rfl: 2 .  Probiotic Product (ALIGN) 4 MG CAPS, Take 1 capsule daily., Disp: 30 capsule, Rfl: 0 .  temazepam (RESTORIL) 15 MG capsule, Take 1 capsule (15 mg total) by mouth at bedtime as needed  for sleep., Disp: 30 capsule, Rfl: 2 .  triamcinolone cream (KENALOG) 0.1 %, APPLY 1 APPLICATION TOPICALLY TWICE DAILY UNTIL RASH CLEARS (7 TO 10 DAYS), Disp: 30 g, Rfl: 0 .  vitamin C (ASCORBIC ACID) 500 MG tablet, Take 500 mg by mouth daily., Disp: , Rfl:   Assessment/ Plan: 58 y.o. female   1. Heavy tobacco smoker >10 cigarettes per day Contemplative.  She does wish to proceed with low-dose CT for lung cancer screening.  She has a chronic smoker's cough but no other symptoms. - CT CHEST LUNG CA SCREEN LOW DOSE W/O CM; Future  2. Tobacco use As above - CT CHEST LUNG CA  SCREEN LOW DOSE W/O CM; Future   Start time: 8:57am End time: 9:05am  Total time spent on patient care (including telephone call/ virtual visit): 15 minutes  [place], [place] [phone]

## 2019-04-08 ENCOUNTER — Encounter (HOSPITAL_COMMUNITY): Payer: Self-pay | Admitting: *Deleted

## 2019-04-08 NOTE — Progress Notes (Signed)
Received referral for initial lung cancer screening scan.  Contacted patient and obtained smoking history (started age 58, current smoker, 14 pack year) as well as answering questions related to the screening process.  This patient does not meet the criteria for our program.  I explained this to the patient and advised her to follow up with her PCP.    I have sent a message back to referring physician that patient does not meet criteria.

## 2019-04-16 ENCOUNTER — Ambulatory Visit: Payer: Medicare Other

## 2019-04-22 ENCOUNTER — Ambulatory Visit: Payer: Medicare Other | Admitting: Gastroenterology

## 2019-05-20 ENCOUNTER — Other Ambulatory Visit: Payer: Self-pay | Admitting: Family Medicine

## 2019-05-20 DIAGNOSIS — K219 Gastro-esophageal reflux disease without esophagitis: Secondary | ICD-10-CM

## 2019-05-25 ENCOUNTER — Other Ambulatory Visit: Payer: Self-pay

## 2019-05-25 ENCOUNTER — Encounter (HOSPITAL_COMMUNITY): Payer: Self-pay | Admitting: Psychiatry

## 2019-05-25 ENCOUNTER — Ambulatory Visit (INDEPENDENT_AMBULATORY_CARE_PROVIDER_SITE_OTHER): Payer: Medicare Other | Admitting: Psychiatry

## 2019-05-25 DIAGNOSIS — F332 Major depressive disorder, recurrent severe without psychotic features: Secondary | ICD-10-CM

## 2019-05-25 MED ORDER — DULOXETINE HCL 60 MG PO CPEP: 60.0000 mg | ORAL_CAPSULE | Freq: Two times a day (BID) | ORAL | 2 refills | Status: DC

## 2019-05-25 MED ORDER — DIAZEPAM 10 MG PO TABS: 10.0000 mg | ORAL_TABLET | Freq: Three times a day (TID) | ORAL | 2 refills | Status: DC

## 2019-05-25 MED ORDER — PRAZOSIN HCL 5 MG PO CAPS: 5.0000 mg | ORAL_CAPSULE | Freq: Every day | ORAL | 2 refills | Status: DC

## 2019-05-25 MED ORDER — TEMAZEPAM 15 MG PO CAPS: 15.0000 mg | ORAL_CAPSULE | Freq: Every evening | ORAL | 2 refills | Status: DC | PRN

## 2019-05-25 NOTE — Progress Notes (Signed)
Virtual Visit via Telephone Note  I connected with [name] on 05/25/19 at  1:00 PM EST by telephone and verified that I am speaking with the correct person using two identifiers.   I discussed the limitations, risks, security and privacy concerns of performing an evaluation and management service by telephone and the availability of in person appointments. I also discussed with the patient that there may be a patient responsible charge related to this service. The patient expressed understanding and agreed to proceed.     I discussed the assessment and treatment plan with the patient. The patient was provided an opportunity to ask questions and all were answered. The patient agreed with the plan and demonstrated an understanding of the instructions.   The patient was advised to call back or seek an in-person evaluation if the symptoms worsen or if the condition fails to improve as anticipated.  I provided 15 minutes of non-face-to-face time during this encounter.   [name], MD  [facility] MD/PA/NP OP Progress Note  05/25/2019 1:19 PM [name]  MRN:  [id number]  Chief Complaint:  Chief Complaint    Depression; Anxiety; Follow-up     HPI:  this patientis a 59 year old divorced white female [place]. She has one son age 61. She had a daughter who died at age 63 in 08/08/2011 of a narcotic overdose. The patient is on disability. She is self-referred.  The patient states that she began getting depressed in her early 76s. She admits to suicide attempts but was never hospitalized but was treated in outpatient clinics. She's been on numerous medications over the years including Celexa, Abilify, Zoloft, amitriptyline, clonazepam and Xanax. She used to go to [place] and families followed by a clinic in [place] and most recently was prescribed medication by her physician at [facility]. Her physician left and she no longer has access to  psychiatric medications. She was on a combination of Celexa, Abilify and clonazepam. She's been off medication for 6 months.  The patient states that her depression got considerably worse after her daughter died in 08-08-11. The patient witnessed her daughter's death and did not knowwhat was going on. She watched her get drowsy through the entire day but didn't realize that she was going through an overdose. By the time she called 911 it was too late. The patient blames herself for her daughter's death. She has nightmares and flashbacks about the events of that day and can't stop thinking about that. She has not had any counseling since her daughter died.  Currently she cries all the time and has no energy. She does not eat well. She is frightened by the house and has constant panic attacks. Her sleep is disrupted by nightmares. She does not hear voices but sometimes hears noises. She's been trying to cope by drinking several shots of alcohol every few days. She claims she used to smoke marijuana but hasn't for one month. She does not use other drugs. She has a boyfriend but is isolating herself from him and does not do much with other friends or family members. She has passive suicidal ideation and "wishes I would die and be with my daughter" but denies any thoughts of self-harm.  The patient returns for follow-up after 2 months.  She states that the temazepam and prescribed is really helped with her sleep.  Overall her mood has been okay.  She is been very stressed because her boyfriend's father recently died from coronavirus  his mother now has it but she is doing okay.  This whole situation has been difficult for them as they cannot comfort the mother because she is sick and they do not want to catch it.  She states that she has her "good and bad days" but overall her mood has been fairly stable and she denies any thoughts of self-harm or suicidal ideation.  She is now sleeping well and the prazosin is  helping with nightmares and Valium continues to help with her anxiety. Visit Diagnosis:    ICD-10-CM   1. Major depressive disorder, recurrent, severe without psychotic features ([place])  F33.2     Past Psychiatric History: Past outpatient treatment  Past Medical History:  Past Medical History:  Diagnosis Date  . Allergy   . Anxiety   . Blood transfusion without reported diagnosis   . Breast cancer ([place])   . Breast cancer ([place]) 04/21/2012   Stage II (T1c N1 M0) grade 3 triple negative breast cancer, right- sided with 1 of 5 positive nodes, status post FEC in a dose dense fashion for 6 cycles followed by radiation therapy by Dr. Sondra Come for her high-risk disease with her initial date of surgery in February 2008.   [name] Chemotherapy-induced neuropathy ([place]) 06/21/2016  . COPD (chronic obstructive pulmonary disease) ([place])   . Depression   . Emphysema of lung ([place])   . GERD (gastroesophageal reflux disease) 12/18/2012  . Headache   . Hyperlipidemia   . Neuropathy   . Osteoporosis   . PONV (postoperative nausea and vomiting)   . PTSD (post-traumatic stress disorder)   . Substance abuse [facility])     Past Surgical History:  Procedure Laterality Date  . BIOPSY  11/05/2017   Procedure: BIOPSY;  Surgeon: Danie Binder, MD;  Location: [facility];  Service: Endoscopy;;  colon duodenum gastric  . BREAST SURGERY    . CATARACT EXTRACTION W/PHACO Left 08/15/2017   Procedure: CATARACT EXTRACTION WITH PHACOEMULSIFICATION  AND INTRAOCULAR LENS PLACEMENT LEFT EYE CDE=2.68;  Surgeon: Baruch Goldmann, MD;  Location: [facility] ORS;  Service: Ophthalmology;  Laterality: Left;  left  . CATARACT EXTRACTION W/PHACO Right 09/06/2017   Procedure: CATARACT EXTRACTION PHACO AND INTRAOCULAR LENS PLACEMENT (IOC);  Surgeon: Baruch Goldmann, MD;  Location: [facility] ORS;  Service: Ophthalmology;  Laterality: Right;  CDE: 1.61  . COLONOSCOPY WITH PROPOFOL N/A 11/05/2017    6 mm polyp in the sigmoid colon which was sessile and removed.   Rectosigmoid colon and sigmoid colon biopsies taken for evaluation for microscopic colitis.  Intermittent rectal bleeding due to sigmoid colon polyp and internal hemorrhoids. Normal colonic biopsies. Tubular adenoma. Repeat surveillance 5-10 years.   . ESOPHAGOGASTRODUODENOSCOPY  03/10/2008   SLF: Normal esophagus without evidence of Barrett, mass, erosion  ulceration, or stricture, small bowel bx negative, gastritis with NO h.pylori  . ESOPHAGOGASTRODUODENOSCOPY (EGD) WITH PROPOFOL N/A 11/05/2017   Mild gastritis and duodenitis.   [name] leocolonoscopy  03/10/2008   [id number] terminal ileum, approximately 10 cm visualized/Normal colon without evidence of polyps, mass, inflammatory changes, diverticula, or AVMs/Normal retroflexed view of the rectum. random colon bx negative.  [name] MASTECTOMY     bilateral, status post reconstruction.  [name] POLYPECTOMY  11/05/2017   Procedure: POLYPECTOMY;  Surgeon: Danie Binder, MD;  Location[facility];  Service: Endoscopy;;  colon  . TUBAL LIGATION      Family Psychiatric History: see below  Family History:  Family History  Problem Relation Age of Onset  . COPD Mother   .  Depression Mother   . Heart disease Mother   . Hyperlipidemia Mother   . Hypertension Mother   . Cancer Mother        lung cancer, to bone  . Cancer Father        skin  . Heart disease Father   . Hypertension Father   . Hyperlipidemia Father   . Learning disabilities Father   . Drug abuse Brother        overdose at 51  . Drug abuse Daughter        drug overdose 96  . Cancer Maternal Uncle        lung cancer  . Colon cancer Neg Hx     Social History:  Social History   Socioeconomic History  . Marital status: Divorced    Spouse name: Not on file  . Number of children: 2  . Years of education: 4  . Highest education level: Not on file  Occupational History  . Occupation: disabled    Comment: neuropathy from chemo  Tobacco Use  . Smoking status: Current Every Day Smoker     Packs/day: 0.50    Years: 20.00    Pack years: 10.00    Types: Cigarettes    Start date: 05/07/1992  . Smokeless tobacco: Never Used  . Tobacco comment: discussed  Substance and Sexual Activity  . Alcohol use: Yes    Alcohol/week: 6.0 standard drinks    Types: 6 Shots of liquor per week    Comment: occ  . Drug use: No  . Sexual activity: Yes    Birth control/protection: Post-menopausal  Other Topics Concern  . Not on file  Social History Narrative   Divorced   Lives alone   Spends time with boyfriend - he is a smoker   Social Determinants of Radio broadcast assistant Strain:   . Difficulty of Paying Living Expenses: Not on file  Food Insecurity:   . Worried About Charity fundraiser in the Last Year: Not on file  . Ran Out of Food in the Last Year: Not on file  Transportation Needs:   . Lack of Transportation (Medical): Not on file  . Lack of Transportation (Non-Medical): Not on file  Physical Activity:   . Days of Exercise per Week: Not on file  . Minutes of Exercise per Session: Not on file  Stress:   . Feeling of Stress : Not on file  Social Connections:   . Frequency of Communication with Friends and Family: Not on file  . Frequency of Social Gatherings with Friends and Family: Not on file  . Attends Religious Services: Not on file  . Active Member of Clubs or Organizations: Not on file  . Attends Archivist Meetings: Not on file  . Marital Status: Not on file    Allergies:  Allergies  Allergen Reactions  . Vicodin [Hydrocodone-Acetaminophen] Nausea Only    Metabolic Disorder Labs: No results found for: HGBA1C, MPG No results found for: PROLACTIN Lab Results  Component Value Date   CHOL 177 07/08/2017   TRIG 120 07/08/2017   HDL 52 07/08/2017   CHOLHDL 3.4 07/08/2017   VLDL 42 (H) 11/23/2016   LDLCALC 103 (H) 07/08/2017   LDLCALC 120 (H) 11/23/2016   Lab Results  Component Value Date   TSH 2.26 11/29/2015   TSH 1.197 03/28/2010     Therapeutic Level Labs: No results found for: LITHIUM No results found for: VALPROATE No components found for:  CBMZ  Current Medications: Current Outpatient Medications  Medication Sig Dispense Refill  . albuterol (PROVENTIL HFA;VENTOLIN HFA) 108 (90 Base) MCG/ACT inhaler Inhale 2 puffs into the lungs every 6 (six) hours as needed for wheezing or shortness of breath. 1 Inhaler 5  . cholecalciferol (VITAMIN D) 1000 units tablet Take 1,000 Units by mouth daily.    . diazepam (VALIUM) 10 MG tablet Take 1 tablet (10 mg total) by mouth 3 (three) times daily. 90 tablet 2  . dicyclomine (BENTYL) 10 MG capsule Take 1 capsule (10 mg total) by mouth 2 (two) times daily. 120 capsule 3  . DULoxetine (CYMBALTA) 60 MG capsule Take 1 capsule (60 mg total) by mouth 2 (two) times daily. 180 capsule 2  . fexofenadine (ALLEGRA) 180 MG tablet Take 1 tablet (180 mg total) by mouth daily as needed for allergies or rhinitis. 90 tablet 3  . fluticasone (FLOVENT HFA) 220 MCG/ACT inhaler Inhale 2 puffs into the lungs 2 (two) times daily. 1 Inhaler 12  . Loperamide HCl (ANTI-DIARRHEAL PO) Take by mouth as needed.    [name] omeprazole (PRILOSEC) 40 MG capsule Take 1 capsule (40 mg total) by mouth daily. (Needs to be seen before next refill) 30 capsule 0  . pravastatin (PRAVACHOL) 20 MG tablet Take 1 tablet (20 mg total) by mouth daily. (Needs to be seen before next refill) 30 tablet 0  . prazosin (MINIPRESS) 5 MG capsule Take 1 capsule (5 mg total) by mouth at bedtime. 30 capsule 2  . Probiotic Product (ALIGN) 4 MG CAPS Take 1 capsule daily. 30 capsule 0  . temazepam (RESTORIL) 15 MG capsule Take 1 capsule (15 mg total) by mouth at bedtime as needed for sleep. 30 capsule 2  . triamcinolone cream (KENALOG) 0.1 % APPLY 1 APPLICATION TOPICALLY TWICE DAILY UNTIL RASH CLEARS (7 TO 10 DAYS) 30 g 0  . vitamin C (ASCORBIC ACID) 500 MG tablet Take 500 mg by mouth daily.     No current facility-administered medications for this  visit.     Musculoskeletal: Strength & Muscle Tone: within normal limits Gait & Station: normal Patient leans: N/A  Psychiatric Specialty Exam: Review of Systems  Respiratory: Positive for shortness of breath.   All other systems reviewed and are negative.   Last menstrual period 07/09/2013.There is no height or weight on file to calculate BMI.  General Appearance: NA  Eye Contact:  NA  Speech:  Clear and Coherent  Volume:  Normal  Mood:  Anxious and Euthymic  Affect:  NA  Thought Process:  Irrelevant  Orientation:  Full (Time, Place, and Person)  Thought Content: Rumination   Suicidal Thoughts:  No  Homicidal Thoughts:  No  Memory:  Immediate;   Good Recent;   Good Remote;   Good  Judgement:  Good  Insight:  Fair  Psychomotor Activity:  Decreased  Concentration:  Concentration: Good and Attention Span: Good  Recall:  Good  Fund of Knowledge: Good  Language: Good  Akathisia:  No  Handed:  Right  AIMS (if indicated): not done  Assets:  Communication Skills Desire for Improvement Resilience Social Support Talents/Skills  ADL's:  Intact  Cognition: WNL  Sleep:  Good   Screenings: PHQ2-9     Office Visit from 03/14/2018 in [place] Visit from 09/02/2017 in [facility] Visit from 11/23/2016 in [facility] Office Visit from 06/21/2016 in [facility]  PHQ-2 Total Score  4  4  0  4  PHQ-9 Total Score  18  9  --  14       Assessment and Plan: This patient is a 59 year old female with a history of depression anxiety and posttraumatic stress disorder.  She is doing better in terms of sleep since we started Restoril 15 mg at bedtime.  She will continue this as well as prazosin 5 mg at bedtime for nightmares, Valium 10 mg 3 times daily for anxiety and Cymbalta 60 mg twice daily for depression.  She will return to see me in 3 months   [name], MD 05/25/2019, 1:19 PM

## 2019-06-02 ENCOUNTER — Other Ambulatory Visit: Payer: Self-pay | Admitting: Family Medicine

## 2019-06-02 DIAGNOSIS — R21 Rash and other nonspecific skin eruption: Secondary | ICD-10-CM

## 2019-06-24 DIAGNOSIS — D229 Melanocytic nevi, unspecified: Secondary | ICD-10-CM | POA: Diagnosis not present

## 2019-06-24 DIAGNOSIS — L409 Psoriasis, unspecified: Secondary | ICD-10-CM | POA: Diagnosis not present

## 2019-07-05 ENCOUNTER — Other Ambulatory Visit: Payer: Self-pay | Admitting: Family Medicine

## 2019-07-22 ENCOUNTER — Ambulatory Visit: Payer: Medicare Other | Admitting: Dermatology

## 2019-07-23 ENCOUNTER — Other Ambulatory Visit: Payer: Self-pay

## 2019-07-23 NOTE — Patient Outreach (Signed)
[facility]) Care Management  07/23/2019  [name] 17-Feb-1961 [id number]   Medication Adherence call to Mrs. [name] HIPPA Compliant Voice message left with a call back number. [name] is showing past due on Pravastatin 20 mg under [place].   [place] Management Direct Dial [phone]  Fax [phone] [email or web address]

## 2019-08-04 ENCOUNTER — Telehealth: Payer: Self-pay | Admitting: Family Medicine

## 2019-08-04 NOTE — Chronic Care Management (AMB) (Signed)
  Chronic Care Management   Note  08/04/2019 Name: CLARYSSA SANDNER MRN: [id number] DOB: 1960-10-12  ADISON REIFSTECK is a 59 y.o. year old female who is a primary care patient of [name], DO. I reached out to America Brown by phone today in response to a referral sent by Ms. Suanne Marker Cornelio's health plan.     Ms. Romain was given information about Chronic Care Management services today including:  1. CCM service includes personalized support from designated clinical staff supervised by her physician, including individualized plan of care and coordination with other care providers 2. 24/7 contact phone numbers for assistance for urgent and routine care needs. 3. Service will only be billed when office clinical staff spend 20 minutes or more in a month to coordinate care. 4. Only one practitioner may furnish and bill the service in a calendar month. 5. The patient may stop CCM services at any time (effective at the end of the month) by phone call to the office staff. 6. The patient will be responsible for cost sharing (co-pay) of up to 20% of the service fee (after annual deductible is met).  Patient agreed to services and verbal consent obtained.   Follow up plan: Telephone appointment with care management team member scheduled for:03/21/2020.  [place], [place] [address] Direct Dial: [phone] [email or web address]: [email or web address]

## 2019-08-24 ENCOUNTER — Ambulatory Visit (HOSPITAL_COMMUNITY): Payer: Medicare Other | Admitting: Psychiatry

## 2019-08-31 ENCOUNTER — Telehealth (HOSPITAL_COMMUNITY): Payer: Medicare Other | Admitting: Psychiatry

## 2019-08-31 ENCOUNTER — Other Ambulatory Visit: Payer: Self-pay

## 2019-09-21 ENCOUNTER — Other Ambulatory Visit: Payer: Self-pay

## 2019-09-21 ENCOUNTER — Telehealth (HOSPITAL_COMMUNITY): Payer: Medicare Other | Admitting: Psychiatry

## 2019-10-02 ENCOUNTER — Telehealth (INDEPENDENT_AMBULATORY_CARE_PROVIDER_SITE_OTHER): Payer: Medicare Other | Admitting: Psychiatry

## 2019-10-02 ENCOUNTER — Other Ambulatory Visit: Payer: Self-pay

## 2019-10-02 ENCOUNTER — Encounter (HOSPITAL_COMMUNITY): Payer: Self-pay | Admitting: Psychiatry

## 2019-10-02 DIAGNOSIS — F332 Major depressive disorder, recurrent severe without psychotic features: Secondary | ICD-10-CM | POA: Diagnosis not present

## 2019-10-02 MED ORDER — DIAZEPAM 10 MG PO TABS: 10.0000 mg | ORAL_TABLET | Freq: Three times a day (TID) | ORAL | 2 refills | Status: DC

## 2019-10-02 MED ORDER — PRAZOSIN HCL 5 MG PO CAPS: 5.0000 mg | ORAL_CAPSULE | Freq: Every day | ORAL | 2 refills | Status: DC

## 2019-10-02 MED ORDER — TEMAZEPAM 15 MG PO CAPS: 15.0000 mg | ORAL_CAPSULE | Freq: Every evening | ORAL | 2 refills | Status: DC | PRN

## 2019-10-02 MED ORDER — DULOXETINE HCL 60 MG PO CPEP: 60.0000 mg | ORAL_CAPSULE | Freq: Two times a day (BID) | ORAL | 2 refills | Status: DC

## 2019-10-02 NOTE — Progress Notes (Signed)
Virtual Visit via Telephone Note  I connected with [name] on 10/02/19 at 10:20 AM EDT by telephone and verified that I am speaking with Judy correct person using two identifiers.   I discussed Judy limitations, risks, security and privacy concerns of performing an evaluation and management service by telephone and Judy availability of in person appointments. I also discussed with Judy patient that there may be a patient responsible charge related to this service. Judy patient expressed understanding and agreed to proceed.      I discussed Judy assessment and treatment plan with Judy patient. Judy patient was provided an opportunity to ask questions and all were answered. Judy patient agreed with Judy plan and demonstrated an understanding of Judy instructions.   Judy patient was advised to call back or seek an in-person evaluation if Judy symptoms worsen or if Judy condition fails to improve as anticipated.  I provided 15 minutes of non-face-to-face time during this encounter. Location: provider home, patient home  Judy Spiller, MD  [facility] MD/PA/NP OP Progress Note  10/02/2019 10:48 AM [name]  MRN:  [id number]  Chief Complaint:  Chief Complaint    Depression; Anxiety; Follow-up    this patientis a 32 year old divorced white female who [place]in [place]. She has one son age 13. She had a daughter who died at age 30 in 18-Aug-2011 of a narcotic overdose. Judy patient is on disability. She is self-referred.  Judy patient states that she began getting depressed in her early 70s. She admits to suicide attempts but was never hospitalized but was treated in outpatient clinics. She's been on numerous medications over Judy years including Celexa, Abilify, Zoloft, amitriptyline, clonazepam and Xanax. She used to go to [place] and families followed by a clinic in [place] and most recently was prescribed medication by her physician at [facility] family medicine. Her physician left and she  no longer has access to psychiatric medications. She was on a combination of Celexa, Abilify and clonazepam. She's been off medication for 6 months.  Judy patient states that her depression got considerably worse after her daughter died in 08-18-11. Judy patient witnessed her daughter's death and did not knowwhat was going on. She watched her get drowsy through Judy entire day but didn't realize that she was going through an overdose. By Judy time she called 911 it was too late. Judy patient blames herself for her daughter's death. She has nightmares and flashbacks about Judy events of that day and can't stop thinking about that. She has not had any counseling since her daughter died.  Currently she cries all Judy time and has no energy. She does not eat well. She is frightened by Judy house and has constant panic attacks. Her sleep is disrupted by nightmares. She does not hear voices but sometimes hears noises. She's been trying to cope by drinking several shots of alcohol every few days. She claims she used to smoke marijuana but hasn't for one month. She does not use other drugs. She has a boyfriend but is isolating herself from him and does not do much with other friends or family members. She has passive suicidal ideation and "wishes I would die and be with my daughter" but denies any thoughts of self-harm  Patient returns for follow-up after 4 months.  She states that she has been very occupied with her father who was in Judy hospital after having coronary artery bypass grafts.  It sounds as if he could possibly be in heart failure  right now.  He is not doing very well and is not eating.  She is quite concerned about him and is spending almost all of her time at Judy hospital with them as is her sister.  She states she finds this mentally and physically exhausting.  I urged her to take frequent breaks in timeout to rest.  She denies serious depression or suicidal ideation.  She continues to sleep well and is not  having nightmares.[name]  HPI:  Visit Diagnosis:    ICD-10-CM   1. Major depressive disorder, recurrent, severe without psychotic features ([name])  F33.2     Past Psychiatric History: Past outpatient treatment  Past Medical History:  Past Medical History:  Diagnosis Date  . Allergy   . Anxiety   . Blood transfusion without reported diagnosis   . Breast cancer ([name])   . Breast cancer ([place]) 04/21/2012   Stage II (T1c N1 M0) grade 3 triple negative breast cancer, right- sided with 1 of 5 positive nodes, status post FEC in a dose dense fashion for 6 cycles followed by radiation therapy by Dr. Sondra Come for her high-risk disease with her initial date of surgery in February 2008.   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 COPD (chronic obstructive pulmonary disease) ([name])   . Depression   . Emphysema of lung ([name])   . GERD (gastroesophageal reflux disease) 12/18/2012  . Headache   . Hyperlipidemia   . Neuropathy   . Osteoporosis   . PONV (postoperative nausea and vomiting)   . PTSD (post-traumatic stress disorder)   . Substance abuse [facility])     Past Surgical History:  Procedure Laterality Date  . BIOPSY  11/05/2017   Procedure: BIOPSY;  Surgeon: Judy Binder, MD;  Location: [facility] ENDO [facility];  Service: Endoscopy;;  colon duodenum gastric  . BREAST SURGERY    . CATARACT EXTRACTION W/PHACO Left 08/15/2017   Procedure: CATARACT EXTRACTION WITH PHACOEMULSIFICATION  AND INTRAOCULAR LENS PLACEMENT LEFT EYE CDE=2.68;  Surgeon: Judy Goldmann, MD;  Location: [facility] ORS;  Service: Ophthalmology;  Laterality: Left;  left  . CATARACT EXTRACTION W/PHACO Right 09/06/2017   Procedure: CATARACT EXTRACTION PHACO AND INTRAOCULAR LENS PLACEMENT (IOC);  Surgeon: Judy Goldmann, MD;  Location: [facility] ORS;  Service: Ophthalmology;  Laterality: Right;  CDE: 1.61  . COLONOSCOPY WITH PROPOFOL N/A 11/05/2017    6 mm polyp in Judy sigmoid colon which was sessile and removed.  Rectosigmoid colon and sigmoid colon  biopsies taken for evaluation for microscopic colitis.  Intermittent rectal bleeding due to sigmoid colon polyp and internal hemorrhoids. Normal colonic biopsies. Tubular adenoma. Repeat surveillance 5-10 years.   . ESOPHAGOGASTRODUODENOSCOPY  03/10/2008   SLF: Normal esophagus without evidence of Barrett, mass, erosion  ulceration, or stricture, small bowel bx negative, gastritis with NO h.pylori  . ESOPHAGOGASTRODUODENOSCOPY (EGD) WITH PROPOFOL N/A 11/05/2017   Mild gastritis and duodenitis.   [name] leocolonoscopy  03/10/2008   [id number] terminal ileum, approximately 10 cm visualized/Normal colon without evidence of polyps, mass, inflammatory changes, diverticula, or AVMs/Normal retroflexed view of Judy rectum. random colon bx negative.  [name] MASTECTOMY     bilateral, status post reconstruction.  [name] POLYPECTOMY  11/05/2017   Procedure: POLYPECTOMY;  Surgeon: Judy Binder, MD;  Location: [facility];  Service: Endoscopy;;  colon  . TUBAL LIGATION      Family Psychiatric History: see below  Family History:  Family History  Problem Relation Age of Onset  . COPD Mother   . Depression Mother   .  Heart disease Mother   . Hyperlipidemia Mother   . Hypertension Mother   . Cancer Mother        lung cancer, to bone  . Cancer Father        skin  . Heart disease Father   . Hypertension Father   . Hyperlipidemia Father   . Learning disabilities Father   . Drug abuse Brother        overdose at 71  . Drug abuse Daughter        drug overdose 63  . Cancer Maternal Uncle        lung cancer  . Colon cancer Neg Hx     Social History:  Social History   Socioeconomic History  . Marital status: Divorced    Spouse name: Not on file  . Number of children: 2  . Years of education: 78  . Highest education level: Not on file  Occupational History  . Occupation: disabled    Comment: neuropathy from chemo  Tobacco Use  . Smoking status: Current Every Day Smoker    Packs/day: 0.50    Years: 20.00     Pack years: 10.00    Types: Cigarettes    Start date: 05/07/1992  . Smokeless tobacco: Never Used  . Tobacco comment: discussed  Substance and Sexual Activity  . Alcohol use: Yes    Alcohol/week: 6.0 standard drinks    Types: 6 Shots of liquor per week    Comment: occ  . Drug use: No  . Sexual activity: Yes    Birth control/protection: Post-menopausal  Other Topics Concern  . Not on file  Social History Narrative   Divorced   Lives alone   Spends time with boyfriend - he is a smoker   Scientist, physiological Strain:   . Difficulty of Paying Living Expenses:   Food Insecurity:   . Worried About Charity fundraiser in Judy Last Year:   . Arboriculturist in Judy Last Year:   Transportation Needs:   . Film/video editor (Medical):   [name] Lack of Transportation (Non-Medical):   Physical Activity:   . Days of Exercise per Week:   . Minutes of Exercise per Session:   Stress:   . Feeling of Stress :   Social Connections:   . Frequency of Communication with Friends and Family:   . Frequency of Social Gatherings with Friends and Family:   . Attends Religious Services:   . Active Member of Clubs or Organizations:   . Attends Archivist Meetings:   [name] Marital Status:     Allergies:  Allergies  Allergen Reactions  . Vicodin [Hydrocodone-Acetaminophen] Nausea Only    Metabolic Disorder Labs: No results found for: HGBA1C, MPG No results found for: PROLACTIN Lab Results  Component Value Date   CHOL 177 07/08/2017   TRIG 120 07/08/2017   HDL 52 07/08/2017   CHOLHDL 3.4 07/08/2017   VLDL 42 (H) 11/23/2016   LDLCALC 103 (H) 07/08/2017   LDLCALC 120 (H) 11/23/2016   Lab Results  Component Value Date   TSH 2.26 11/29/2015   TSH 1.197 03/28/2010    Therapeutic Level Labs: No results found for: LITHIUM No results found for: VALPROATE No components found for:  CBMZ  Current Medications: Current Outpatient Medications  Medication  Sig Dispense Refill  . albuterol (PROVENTIL HFA;VENTOLIN HFA) 108 (90 Base) MCG/ACT inhaler Inhale 2 puffs into Judy lungs every 6 (six) hours  as needed for wheezing or shortness of breath. 1 Inhaler 5  . cholecalciferol (VITAMIN D) 1000 units tablet Take 1,000 Units by mouth daily.    . diazepam (VALIUM) 10 MG tablet Take 1 tablet (10 mg total) by mouth 3 (three) times daily. 90 tablet 2  . dicyclomine (BENTYL) 10 MG capsule Take 1 capsule (10 mg total) by mouth 2 (two) times daily. 120 capsule 3  . DULoxetine (CYMBALTA) 60 MG capsule Take 1 capsule (60 mg total) by mouth 2 (two) times daily. 180 capsule 2  . fexofenadine (ALLEGRA) 180 MG tablet Take 1 tablet (180 mg total) by mouth daily as needed for allergies or rhinitis. 90 tablet 3  . fluticasone (FLOVENT HFA) 220 MCG/ACT inhaler Inhale 2 puffs into Judy lungs 2 (two) times daily. 1 Inhaler 12  . Loperamide HCl (ANTI-DIARRHEAL PO) Take by mouth as needed.    [name] omeprazole (PRILOSEC) 40 MG capsule Take 1 capsule (40 mg total) by mouth daily. (Needs to be seen before next refill) 30 capsule 0  . pravastatin (PRAVACHOL) 20 MG tablet TAKE 1 TABLET BY MOUTH ONCE DAILY . APPOINTMENT REQUIRED FOR FUTURE REFILLS 30 tablet 0  . prazosin (MINIPRESS) 5 MG capsule Take 1 capsule (5 mg total) by mouth at bedtime. 30 capsule 2  . Probiotic Product (ALIGN) 4 MG CAPS Take 1 capsule daily. 30 capsule 0  . temazepam (RESTORIL) 15 MG capsule Take 1 capsule (15 mg total) by mouth at bedtime as needed for sleep. 30 capsule 2  . triamcinolone cream (KENALOG) 0.1 % APPLY 1 APPLICATION TOPICALLY TWICE DAILY UNTIL RASH CLEARS (7 TO 10 DAYS) 30 g 0  . vitamin C (ASCORBIC ACID) 500 MG tablet Take 500 mg by mouth daily.     No current facility-administered medications for this visit.     Musculoskeletal: Strength & Muscle Tone: within normal limits Gait & Station: normal Patient leans: N/A  Psychiatric Specialty Exam: Review of Systems  All other systems reviewed  and are negative.   Last menstrual period 07/09/2013.There is no height or weight on file to calculate BMI.  General Appearance: NA  Eye Contact:  NA  Speech:  Clear and Coherent  Volume:  Normal  Mood:  Euthymic  Affect:  Appropriate and Congruent  Thought Process:  Goal Directed  Orientation:  Full (Time, Place, and Person)  Thought Content: Rumination   Suicidal Thoughts:  No  Homicidal Thoughts:  No  Memory:  Immediate;   Good Recent;   Good Remote;   Fair  Judgement:  Good  Insight:  Good  Psychomotor Activity:  Decreased  Concentration:  Concentration: Good and Attention Span: Good  Recall:  Good  Fund of Knowledge: Good  Language: Good  Akathisia:  No  Handed:  Right  AIMS (if indicated): not done  Assets:  Communication Skills Desire for Improvement Physical Health Resilience Social Support Talents/Skills  ADL's:  Intact  Cognition: WNL  Sleep:  Good   Screenings: PHQ2-9     Office Visit from 03/14/2018 in [facility] Office Visit from 09/02/2017 in [place] Visit from 11/23/2016 in [facility] Office Visit from 06/21/2016 in [facility]  PHQ-2 Total Score  4  4  0  4  PHQ-9 Total Score  18  9  --  14       Assessment and Plan: This patient is a 59 year old female with a history of depression anxiety and PTSD.  She continues to do  well despite Judy stress regarding her father's illness.  She will continue Cymbalta 60 mg twice daily for depression, Valium 10 mg 3 times daily for anxiety, Restoril 15 mg at night for sleep and prazosin 5 mg at bedtime to prevent nightmares.  She will return to see me in 3 months   Judy Spiller, MD 10/02/2019, 10:48 AM

## 2019-11-13 ENCOUNTER — Ambulatory Visit (INDEPENDENT_AMBULATORY_CARE_PROVIDER_SITE_OTHER): Payer: Medicare Other | Admitting: Family Medicine

## 2019-11-13 ENCOUNTER — Other Ambulatory Visit (HOSPITAL_COMMUNITY): Payer: Self-pay | Admitting: Psychiatry

## 2019-11-13 ENCOUNTER — Other Ambulatory Visit: Payer: Self-pay | Admitting: Family Medicine

## 2019-11-13 ENCOUNTER — Encounter: Payer: Self-pay | Admitting: Family Medicine

## 2019-11-13 DIAGNOSIS — J441 Chronic obstructive pulmonary disease with (acute) exacerbation: Secondary | ICD-10-CM

## 2019-11-13 DIAGNOSIS — K219 Gastro-esophageal reflux disease without esophagitis: Secondary | ICD-10-CM

## 2019-11-13 MED ORDER — AZITHROMYCIN 250 MG PO TABS: ORAL_TABLET | ORAL | 0 refills | Status: DC

## 2019-11-13 MED ORDER — FLUTICASONE PROPIONATE 50 MCG/ACT NA SUSP: 1.0000 | Freq: Two times a day (BID) | NASAL | 6 refills | Status: DC | PRN

## 2019-11-13 NOTE — Telephone Encounter (Signed)
ntbs with PCP for chronic follow up-no labs drawn since 2019

## 2019-11-13 NOTE — Progress Notes (Signed)
Virtual Visit via telephone Note  I connected with [name] on 11/13/19 at 0810 by telephone and verified that I am speaking with the correct person using two identifiers. [name] is currently located at home and no other people are currently with her during visit. The provider, Fransisca Kaufmann Garren Greenman, MD is located in their office at time of visit.  Call ended at [facility]  I discussed the limitations, risks, security and privacy concerns of performing an evaluation and management service by telephone and the availability of in person appointments. I also discussed with the patient that there may be a patient responsible charge related to this service. The patient expressed understanding and agreed to proceed.   History and Present Illness: Patient is calling in for headache and coughing and sinus ocngestion.  She was tested for covid negative last Friday.  She has sinus headache and chills and drainage and cough.  She has used albuterol and it is helping. She has copd and is still smoking 1ppd.  She is looking to quit but not right. She has been using allergy medicine and advil cold plus and they are not helping.   No diagnosis found.  Outpatient Encounter Medications as of 11/13/2019  Medication Sig  . albuterol (PROVENTIL HFA;VENTOLIN HFA) 108 (90 Base) MCG/ACT inhaler Inhale 2 puffs into the lungs every 6 (six) hours as needed for wheezing or shortness of breath.  . cholecalciferol (VITAMIN D) 1000 units tablet Take 1,000 Units by mouth daily.  . diazepam (VALIUM) 10 MG tablet Take 1 tablet (10 mg total) by mouth 3 (three) times daily.  [name] dicyclomine (BENTYL) 10 MG capsule Take 1 capsule (10 mg total) by mouth 2 (two) times daily.  . DULoxetine (CYMBALTA) 60 MG capsule Take 1 capsule (60 mg total) by mouth 2 (two) times daily.  . fexofenadine (ALLEGRA) 180 MG tablet Take 1 tablet (180 mg total) by mouth daily as needed for allergies or rhinitis.  . fluticasone (FLOVENT HFA) 220  MCG/ACT inhaler Inhale 2 puffs into the lungs 2 (two) times daily.  . Loperamide HCl (ANTI-DIARRHEAL PO) Take by mouth as needed.  [name] omeprazole (PRILOSEC) 40 MG capsule Take 1 capsule (40 mg total) by mouth daily. (Needs to be seen before next refill)  . pravastatin (PRAVACHOL) 20 MG tablet TAKE 1 TABLET BY MOUTH ONCE DAILY . APPOINTMENT REQUIRED FOR FUTURE REFILLS  . prazosin (MINIPRESS) 5 MG capsule Take 1 capsule (5 mg total) by mouth at bedtime.  . Probiotic Product (ALIGN) 4 MG CAPS Take 1 capsule daily.  . temazepam (RESTORIL) 15 MG capsule Take 1 capsule (15 mg total) by mouth at bedtime as needed for sleep.  [name] triamcinolone cream (KENALOG) 0.1 % APPLY 1 APPLICATION TOPICALLY TWICE DAILY UNTIL RASH CLEARS (7 TO 10 DAYS)  . vitamin C (ASCORBIC ACID) 500 MG tablet Take 500 mg by mouth daily.   No facility-administered encounter medications on file as of 11/13/2019.    Review of Systems  Constitutional: Negative for chills and fever.  HENT: Positive for congestion, postnasal drip, rhinorrhea, sinus pressure, sneezing and sore throat. Negative for ear discharge and ear pain.   Eyes: Negative for pain, redness and visual disturbance.  Respiratory: Positive for cough and wheezing. Negative for chest tightness and shortness of breath.   Cardiovascular: Negative for chest pain and leg swelling.  Genitourinary: Negative for difficulty urinating and dysuria.  Musculoskeletal: Negative for back pain and gait problem.  Skin: Negative for rash.  Neurological: Negative  for light-headedness and headaches.  Psychiatric/Behavioral: Negative for agitation and behavioral problems.  All other systems reviewed and are negative.   Observations/Objective: Patient sounds comfortable and in no acute distress  Assessment and Plan: Problem List Items Addressed This Visit    None    Visit Diagnoses    COPD with acute exacerbation ([place])    -  Primary   Relevant Medications   azithromycin (ZITHROMAX) 250  MG tablet   fluticasone (FLONASE) 50 MCG/ACT nasal spray       Follow up plan: Return if symptoms worsen or fail to improve.     I discussed the assessment and treatment plan with the patient. The patient was provided an opportunity to ask questions and all were answered. The patient agreed with the plan and demonstrated an understanding of the instructions.   The patient was advised to call back or seek an in-person evaluation if the symptoms worsen or if the condition fails to improve as anticipated.  The above assessment and management plan was discussed with the patient. The patient verbalized understanding of and has agreed to the management plan. Patient is aware to call the clinic if symptoms persist or worsen. Patient is aware when to return to the clinic for a follow-up visit. Patient educated on when it is appropriate to go to the emergency department.    I provided 8 minutes of non-face-to-face time during this encounter.    Worthy Rancher, MD

## 2019-11-13 NOTE — Telephone Encounter (Signed)
Attempted to contact- number not working number

## 2019-11-16 NOTE — Telephone Encounter (Signed)
Called patient LMTCB. 

## 2019-11-18 ENCOUNTER — Other Ambulatory Visit: Payer: Self-pay

## 2019-11-18 ENCOUNTER — Telehealth: Payer: Self-pay

## 2019-11-18 ENCOUNTER — Other Ambulatory Visit: Payer: Self-pay | Admitting: Family Medicine

## 2019-11-18 DIAGNOSIS — J410 Simple chronic bronchitis: Secondary | ICD-10-CM

## 2019-11-18 DIAGNOSIS — Z720 Tobacco use: Secondary | ICD-10-CM

## 2019-11-18 DIAGNOSIS — J449 Chronic obstructive pulmonary disease, unspecified: Secondary | ICD-10-CM

## 2019-11-18 DIAGNOSIS — N63 Unspecified lump in unspecified breast: Secondary | ICD-10-CM

## 2019-11-18 DIAGNOSIS — C50919 Malignant neoplasm of unspecified site of unspecified female breast: Secondary | ICD-10-CM

## 2019-11-18 DIAGNOSIS — Z1239 Encounter for other screening for malignant neoplasm of breast: Secondary | ICD-10-CM

## 2019-11-18 MED ORDER — OMEPRAZOLE 40 MG PO CPDR: 40.0000 mg | DELAYED_RELEASE_CAPSULE | Freq: Every day | ORAL | 0 refills | Status: DC

## 2019-11-18 NOTE — Telephone Encounter (Signed)
Patient states that her home health nurse advised her to contact us about possible getting a CXR because of her wheezing and hx of COPD.  Patient is currently having cough, productive at times, congestion, runny nose. Patient was seen via televist by Dettinger on 11/13/2019 and states that she was tested last week for COVID and it come back negative. Patient finish z-pak yesterday and states that she is feeling better. Please advise if order for xray approved.   Patient will need to come at 4 or after - through back entrance - same procedure as the afternoon respiratory patients.

## 2019-11-18 NOTE — Progress Notes (Signed)
Placed order for Breast MR and ultrasounds - patient has history of breast cancer, high risk lesion, and had nodule reported on last [place] in 2010 - 3 month follow up was recommended but patient has not had imaging since.

## 2019-11-18 NOTE — Telephone Encounter (Signed)
Patient called back - appt made with Dr. Lajuana Ripple for 12/21/2019 @ 115 - patient informed to come the week before for fasting labs. 30 days of medication until patient can be seen

## 2019-11-18 NOTE — Telephone Encounter (Signed)
Called patient appt made for Monday 11/23/2019 at 425 with Dettinger  Patient states that she is feeling better since being on the antibiotic

## 2019-11-18 NOTE — Telephone Encounter (Signed)
Please get her into the respiratory clinic appointments in the end of the day on one of the call days and we can do a chest x-ray at that time if needed

## 2019-11-23 ENCOUNTER — Encounter: Payer: Self-pay | Admitting: Family Medicine

## 2019-11-23 ENCOUNTER — Ambulatory Visit (INDEPENDENT_AMBULATORY_CARE_PROVIDER_SITE_OTHER): Payer: Medicare Other | Admitting: Family Medicine

## 2019-11-23 ENCOUNTER — Ambulatory Visit (INDEPENDENT_AMBULATORY_CARE_PROVIDER_SITE_OTHER): Payer: Medicare Other

## 2019-11-23 ENCOUNTER — Other Ambulatory Visit: Payer: Self-pay

## 2019-11-23 VITALS — BP 114/64 | HR 82 | Temp 98.1°F | Ht 64.0 in | Wt 163.0 lb

## 2019-11-23 DIAGNOSIS — Z85118 Personal history of other malignant neoplasm of bronchus and lung: Secondary | ICD-10-CM

## 2019-11-23 DIAGNOSIS — J449 Chronic obstructive pulmonary disease, unspecified: Secondary | ICD-10-CM

## 2019-11-23 DIAGNOSIS — R05 Cough: Secondary | ICD-10-CM

## 2019-11-23 DIAGNOSIS — R058 Other specified cough: Secondary | ICD-10-CM

## 2019-11-23 MED ORDER — AMOXICILLIN-POT CLAVULANATE 875-125 MG PO TABS: 1.0000 | ORAL_TABLET | Freq: Two times a day (BID) | ORAL | 0 refills | Status: DC

## 2019-11-23 MED ORDER — PREDNISONE 20 MG PO TABS: ORAL_TABLET | ORAL | 0 refills | Status: DC

## 2019-11-23 NOTE — Progress Notes (Signed)
BP 114/64   Pulse 82   Temp 98.1 F (36.7 C)   Ht 5\' 4"  (1.626 m)   Wt 163 lb (73.9 kg)   LMP 07/09/2013   SpO2 93%   BMI 27.98 kg/m    Subjective:   Patient ID: [name], female    DOB: 01/01/1961, 59 y.o.   MRN: [id number]  HPI: [name] is a 59 y.o. female presenting on 11/23/2019 for URI (hx of lung cancer. Smoker. Grey mucous. Pt is worried about more cancer.)   HPI Patient is coming in after being treated for COPD with flonase and azithromycin.  She is still having wheezing and cough that slightly improved. She is starting to cough up blackish grey gunk and is concerned because of family history of breast cancer. Patient denies any fever or chills. She has SOB on exertion that has gradually worsened.  Relevant past medical, surgical, family and social history reviewed and updated as indicated. Interim medical history since our last visit reviewed. Allergies and medications reviewed and updated.  Review of Systems  Constitutional: Positive for chills. Negative for fever.  HENT: Positive for congestion, postnasal drip, rhinorrhea, sinus pressure, sneezing and sore throat. Negative for ear discharge and ear pain.   Eyes: Negative for pain, redness and visual disturbance.  Respiratory: Positive for cough, shortness of breath and wheezing. Negative for chest tightness.   Cardiovascular: Negative for chest pain and leg swelling.  Genitourinary: Negative for difficulty urinating and dysuria.  Musculoskeletal: Negative for back pain and gait problem.  Skin: Negative for rash.  Neurological: Negative for light-headedness and headaches.  Psychiatric/Behavioral: Negative for agitation and behavioral problems.  All other systems reviewed and are negative.   Per HPI unless specifically indicated above      Objective:   BP 114/64   Pulse 82   Temp 98.1 F (36.7 C)   Ht 5\' 4"  (1.626 m)   Wt 163 lb (73.9 kg)   LMP 07/09/2013   SpO2 93%   BMI 27.98 kg/m    Wt Readings from Last 3 Encounters:  11/23/19 163 lb (73.9 kg)  01/21/19 172 lb 9.6 oz (78.3 kg)  04/10/18 178 lb (80.7 kg)    Physical Exam Vitals reviewed.  Constitutional:      General: She is not in acute distress.    Appearance: She is well-developed. She is not diaphoretic.  HENT:     Right Ear: Tympanic membrane, ear canal and external ear normal.     Left Ear: Tympanic membrane, ear canal and external ear normal.     Nose: Mucosal edema present. No rhinorrhea.     Right Sinus: No maxillary sinus tenderness or frontal sinus tenderness.     Left Sinus: No maxillary sinus tenderness or frontal sinus tenderness.     Mouth/Throat:     Pharynx: Uvula midline. No oropharyngeal exudate or posterior oropharyngeal erythema.     Tonsils: No tonsillar abscesses.  Eyes:     Conjunctiva/sclera: Conjunctivae normal.  Cardiovascular:     Rate and Rhythm: Normal rate and regular rhythm.     Heart sounds: Normal heart sounds. No murmur heard.   Pulmonary:     Effort: Pulmonary effort is normal. No respiratory distress.     Breath sounds: Wheezing (b/l) and rhonchi present.  Musculoskeletal:        General: No tenderness. Normal range of motion.  Skin:    General: Skin is warm and dry.     Findings: No rash.  Neurological:     Mental Status: She is alert and oriented to person, place, and time.     Coordination: Coordination normal.  Psychiatric:        Behavior: Behavior normal.     CXR: No signs of acute abnormalities, likely COPD exacerbation, await final read from radiology  Assessment & Plan:   Problem List Items Addressed This Visit      Respiratory   COPD mixed type ([place])   Relevant Medications   Chlorpheniramine-DM (COUGH & COLD HBP PO)   Other Relevant Orders   DG Chest 2 View    Other Visit Diagnoses    Productive cough    -  Primary   Relevant Orders   DG Chest 2 View   History of lung cancer       Relevant Orders   DG Chest 2 View       Follow up  plan: Return if symptoms worsen or fail to improve.  Counseling provided for all of the vaccine components Orders Placed This Encounter  Procedures  . DG Chest 2 View    Caryl Pina, MD [facility] 11/23/2019, 4:53 PM

## 2019-12-08 ENCOUNTER — Other Ambulatory Visit: Payer: Self-pay | Admitting: Family Medicine

## 2019-12-09 ENCOUNTER — Ambulatory Visit (HOSPITAL_COMMUNITY)
Admission: RE | Admit: 2019-12-09 | Discharge: 2019-12-09 | Disposition: A | Discharge status: Home / Self Care | Payer: Medicare Other | Source: Ambulatory Visit | Attending: Family Medicine | Admitting: Family Medicine

## 2019-12-09 ENCOUNTER — Other Ambulatory Visit: Payer: Self-pay

## 2019-12-09 DIAGNOSIS — Z1239 Encounter for other screening for malignant neoplasm of breast: Secondary | ICD-10-CM | POA: Insufficient documentation

## 2019-12-09 DIAGNOSIS — R928 Other abnormal and inconclusive findings on diagnostic imaging of breast: Secondary | ICD-10-CM | POA: Diagnosis not present

## 2019-12-09 DIAGNOSIS — C50919 Malignant neoplasm of unspecified site of unspecified female breast: Secondary | ICD-10-CM | POA: Insufficient documentation

## 2019-12-09 DIAGNOSIS — N63 Unspecified lump in unspecified breast: Secondary | ICD-10-CM | POA: Diagnosis not present

## 2019-12-09 MED ORDER — GADOBUTROL 1 MMOL/ML IV SOLN
7.0000 mL | Freq: Once | INTRAVENOUS | Status: AC | PRN
  Administered 2019-12-09: 7 mL via INTRAVENOUS

## 2019-12-14 ENCOUNTER — Telehealth: Payer: Self-pay | Admitting: Family Medicine

## 2019-12-14 NOTE — Telephone Encounter (Signed)
IMPRESSION: 1. No MRI evidence for malignancy in either breast. 2. Status post bilateral mastectomy. Bilateral retropectoral silicone implants are intact.   Above is from the MRI report which I read to the pt and advised if Dr Darnell Level gave any further recommendations after reviewing we would let her know and pt voiced understanding.

## 2019-12-15 ENCOUNTER — Ambulatory Visit (HOSPITAL_COMMUNITY): Payer: Medicare Other

## 2019-12-18 ENCOUNTER — Other Ambulatory Visit: Payer: Self-pay | Admitting: Family Medicine

## 2019-12-18 ENCOUNTER — Other Ambulatory Visit: Payer: Medicare Other

## 2019-12-18 ENCOUNTER — Other Ambulatory Visit: Payer: Self-pay

## 2019-12-18 DIAGNOSIS — J449 Chronic obstructive pulmonary disease, unspecified: Secondary | ICD-10-CM

## 2019-12-18 DIAGNOSIS — E559 Vitamin D deficiency, unspecified: Secondary | ICD-10-CM

## 2019-12-18 DIAGNOSIS — Z13220 Encounter for screening for lipoid disorders: Secondary | ICD-10-CM | POA: Diagnosis not present

## 2019-12-18 DIAGNOSIS — E785 Hyperlipidemia, unspecified: Secondary | ICD-10-CM | POA: Diagnosis not present

## 2019-12-18 DIAGNOSIS — Z1329 Encounter for screening for other suspected endocrine disorder: Secondary | ICD-10-CM | POA: Diagnosis not present

## 2019-12-18 DIAGNOSIS — Z85118 Personal history of other malignant neoplasm of bronchus and lung: Secondary | ICD-10-CM | POA: Diagnosis not present

## 2019-12-18 DIAGNOSIS — R6889 Other general symptoms and signs: Secondary | ICD-10-CM | POA: Diagnosis not present

## 2019-12-19 LAB — CBC WITH DIFFERENTIAL/PLATELET
Basophils Absolute: 0.1 10*3/uL (ref 0.0–0.2)
Basos: 1 %
EOS (ABSOLUTE): 0.1 10*3/uL (ref 0.0–0.4)
Eos: 1 %
Hematocrit: 46.2 % (ref 34.0–46.6)
Hemoglobin: 15.7 g/dL (ref 11.1–15.9)
Immature Grans (Abs): 0 10*3/uL (ref 0.0–0.1)
Immature Granulocytes: 0 %
Lymphocytes Absolute: 3.5 10*3/uL — ABNORMAL HIGH (ref 0.7–3.1)
Lymphs: 33 %
MCH: 31.5 pg (ref 26.6–33.0)
MCHC: 34 g/dL (ref 31.5–35.7)
MCV: 93 fL (ref 79–97)
Monocytes Absolute: 0.5 10*3/uL (ref 0.1–0.9)
Monocytes: 5 %
Neutrophils Absolute: 6.4 10*3/uL (ref 1.4–7.0)
Neutrophils: 60 %
Platelets: 341 10*3/uL (ref 150–450)
RBC: 4.98 x10E6/uL (ref 3.77–5.28)
RDW: 11.9 % (ref 11.7–15.4)
WBC: 10.6 10*3/uL (ref 3.4–10.8)

## 2019-12-19 LAB — CMP14+EGFR
ALT: 14 IU/L (ref 0–32)
AST: 14 IU/L (ref 0–40)
Albumin/Globulin Ratio: 1.9 (ref 1.2–2.2)
Albumin: 4.7 g/dL (ref 3.8–4.9)
Alkaline Phosphatase: 82 IU/L (ref 48–121)
BUN/Creatinine Ratio: 8 — ABNORMAL LOW (ref 9–23)
BUN: 6 mg/dL (ref 6–24)
Bilirubin Total: 0.5 mg/dL (ref 0.0–1.2)
CO2: 24 mmol/L (ref 20–29)
Calcium: 9.9 mg/dL (ref 8.7–10.2)
Chloride: 101 mmol/L (ref 96–106)
Creatinine, Ser: 0.71 mg/dL (ref 0.57–1.00)
GFR calc Af Amer: 109 mL/min/{1.73_m2} (ref >59)
GFR calc non Af Amer: 95 mL/min/{1.73_m2} (ref >59)
Globulin, Total: 2.5 g/dL (ref 1.5–4.5)
Glucose: 96 mg/dL (ref 65–99)
Potassium: 4.6 mmol/L (ref 3.5–5.2)
Sodium: 141 mmol/L (ref 134–144)
Total Protein: 7.2 g/dL (ref 6.0–8.5)

## 2019-12-19 LAB — LIPID PANEL
Chol/HDL Ratio: 3.2 ratio (ref 0.0–4.4)
Cholesterol, Total: 172 mg/dL (ref 100–199)
HDL: 53 mg/dL (ref >39)
LDL Chol Calc (NIH): 99 mg/dL (ref 0–99)
Triglycerides: 110 mg/dL (ref 0–149)
VLDL Cholesterol Cal: 20 mg/dL (ref 5–40)

## 2019-12-19 LAB — VITAMIN D 25 HYDROXY (VIT D DEFICIENCY, FRACTURES): Vit D, 25-Hydroxy: 28.4 ng/mL — ABNORMAL LOW (ref 30.0–100.0)

## 2019-12-19 LAB — TSH: TSH: 2.62 u[IU]/mL (ref 0.450–4.500)

## 2019-12-21 ENCOUNTER — Other Ambulatory Visit: Payer: Self-pay

## 2019-12-21 ENCOUNTER — Encounter: Payer: Self-pay | Admitting: Family Medicine

## 2019-12-21 ENCOUNTER — Ambulatory Visit: Payer: Medicare Other | Admitting: Family Medicine

## 2019-12-21 ENCOUNTER — Ambulatory Visit (INDEPENDENT_AMBULATORY_CARE_PROVIDER_SITE_OTHER): Payer: Medicare Other | Admitting: Family Medicine

## 2019-12-21 VITALS — BP 106/61 | HR 84 | Temp 97.5°F | Ht 64.0 in | Wt 166.2 lb

## 2019-12-21 DIAGNOSIS — J410 Simple chronic bronchitis: Secondary | ICD-10-CM

## 2019-12-21 DIAGNOSIS — R05 Cough: Secondary | ICD-10-CM

## 2019-12-21 DIAGNOSIS — Z720 Tobacco use: Secondary | ICD-10-CM | POA: Diagnosis not present

## 2019-12-21 DIAGNOSIS — E7800 Pure hypercholesterolemia, unspecified: Secondary | ICD-10-CM

## 2019-12-21 DIAGNOSIS — R053 Chronic cough: Secondary | ICD-10-CM

## 2019-12-21 DIAGNOSIS — K219 Gastro-esophageal reflux disease without esophagitis: Secondary | ICD-10-CM

## 2019-12-21 MED ORDER — ALBUTEROL SULFATE HFA 108 (90 BASE) MCG/ACT IN AERS: 2.0000 | INHALATION_SPRAY | Freq: Four times a day (QID) | RESPIRATORY_TRACT | 3 refills | Status: DC | PRN

## 2019-12-21 MED ORDER — OMEPRAZOLE 40 MG PO CPDR: 40.0000 mg | DELAYED_RELEASE_CAPSULE | Freq: Every day | ORAL | 3 refills | Status: DC

## 2019-12-21 MED ORDER — PRAVASTATIN SODIUM 20 MG PO TABS: 20.0000 mg | ORAL_TABLET | Freq: Every day | ORAL | 3 refills | Status: DC

## 2019-12-21 MED ORDER — FLOVENT HFA 220 MCG/ACT IN AERO: 2.0000 | INHALATION_SPRAY | Freq: Two times a day (BID) | RESPIRATORY_TRACT | 12 refills | Status: DC

## 2019-12-21 NOTE — Patient Instructions (Signed)
Labs look good  Use Flovent (fluticasone) EVERY DAY, TWICE DAILY Albuterol is AS NEEDED FOR breakthrough wheezing, shortness of breath

## 2019-12-21 NOTE — Progress Notes (Signed)
Subjective: CC: Follow-up hyperlipidemia, GERD and COPD PCP: [name], DO OIT:GPQDIY [name] is a 59 y.o. female presenting to clinic today for:  1.  COPD with chronic bronchitis Patient reports intermittent compliance with Flovent.  She continues to smoke.  No hemoptysis, unplanned weight loss, night sweats  2.  GERD Patient reports good control of GERD with Prilosec 40 mg daily.  No abdominal pain, nausea, vomiting, hematochezia or melena.  3.  Hyperlipidemia Patient reports compliance with Pravachol 20 mg daily.  Does not report any chest pain.  She occasionally has shortness of breath as above.  No abdominal pain or jaundice   ROS: Per HPI  Allergies  Allergen Reactions   Vicodin [Hydrocodone-Acetaminophen] Nausea Only   Past Medical History:  Diagnosis Date   Allergy    Anxiety    Blood transfusion without reported diagnosis    Breast cancer ([place])    Breast cancer ([place]) 04/21/2012   Stage II (T1c N1 M0) grade 3 triple negative breast cancer, right- sided with 1 of 5 positive nodes, status post FEC in a dose dense fashion for 6 cycles followed by radiation therapy by Dr. Sondra Come for her high-risk disease with her initial date of surgery in February 2008.    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   COPD (chronic obstructive pulmonary disease) (HCC)    Depression    Emphysema of lung (HCC)    GERD (gastroesophageal reflux disease) 12/18/2012   Headache    Hyperlipidemia    Neuropathy    Osteoporosis    PONV (postoperative nausea and vomiting)    PTSD (post-traumatic stress disorder)    Substance abuse ([place])     Current Outpatient Medications:    albuterol (PROVENTIL HFA;VENTOLIN HFA) 108 (90 Base) MCG/ACT inhaler, Inhale 2 puffs into the lungs every 6 (six) hours as needed for wheezing or shortness of breath., Disp: 1 Inhaler, Rfl: 5   Chlorpheniramine-DM (COUGH & COLD HBP PO), Take by mouth as needed., Disp: , Rfl:     cholecalciferol (VITAMIN D) 1000 units tablet, Take 1,000 Units by mouth daily., Disp: , Rfl:    diazepam (VALIUM) 10 MG tablet, Take 1 tablet (10 mg total) by mouth 3 (three) times daily., Disp: 90 tablet, Rfl: 2   dicyclomine (BENTYL) 10 MG capsule, Take 1 capsule (10 mg total) by mouth 2 (two) times daily., Disp: 120 capsule, Rfl: 3   DULoxetine (CYMBALTA) 60 MG capsule, Take 1 capsule (60 mg total) by mouth 2 (two) times daily., Disp: 180 capsule, Rfl: 2   fexofenadine (ALLEGRA) 180 MG tablet, Take 1 tablet (180 mg total) by mouth daily as needed for allergies or rhinitis., Disp: 90 tablet, Rfl: 3   fluticasone (FLONASE) 50 MCG/ACT nasal spray, Place 1 spray into both nostrils 2 (two) times daily as needed for allergies or rhinitis., Disp: 16 g, Rfl: 6   fluticasone (FLOVENT HFA) 220 MCG/ACT inhaler, Inhale 2 puffs into the lungs 2 (two) times daily., Disp: 1 Inhaler, Rfl: 12   Loperamide HCl (ANTI-DIARRHEAL PO), Take by mouth as needed., Disp: , Rfl:    Multiple Vitamin (MULTIVITAMIN) capsule, Take 1 capsule by mouth daily., Disp: , Rfl:    omeprazole (PRILOSEC) 40 MG capsule, Take 1 capsule (40 mg total) by mouth daily. (Needs to be seen before next refill), Disp: 30 capsule, Rfl: 0   pravastatin (PRAVACHOL) 20 MG tablet, Take 1 tablet (20 mg total) by mouth daily., Disp: 30 tablet, Rfl: 0   prazosin (MINIPRESS) 5 MG capsule,  Take 1 capsule (5 mg total) by mouth at bedtime., Disp: 30 capsule, Rfl: 2   temazepam (RESTORIL) 15 MG capsule, Take 1 capsule (15 mg total) by mouth at bedtime as needed for sleep., Disp: 30 capsule, Rfl: 2   triamcinolone cream (KENALOG) 0.1 %, APPLY 1 APPLICATION TOPICALLY TWICE DAILY UNTIL RASH CLEARS (7 TO 10 DAYS), Disp: 30 g, Rfl: 0   vitamin C (ASCORBIC ACID) 500 MG tablet, Take 500 mg by mouth daily., Disp: , Rfl:  Social History   Socioeconomic History   Marital status: Divorced    Spouse name: Not on file   Number of children: 2   Years of  education: 13   Highest education level: Not on file  Occupational History   Occupation: disabled    Comment: neuropathy from chemo  Tobacco Use   Smoking status: Current Every Day Smoker    Packs/day: 0.50    Years: 20.00    Pack years: 10.00    Types: Cigarettes    Start date: 05/07/1992   Smokeless tobacco: Never Used   Tobacco comment: discussed  Substance and Sexual Activity   Alcohol use: Yes    Alcohol/week: 6.0 standard drinks    Types: 6 Shots of liquor per week    Comment: occ   Drug use: No   Sexual activity: Yes    Birth control/protection: Post-menopausal  Other Topics Concern   Not on file  Social History Narrative   Divorced   Lives alone   Spends time with boyfriend - he is a smoker   Investment banker, operational of Radio broadcast assistant Strain:    Difficulty of Paying Living Expenses:   Food Insecurity:    Worried About Charity fundraiser in the Last Year:    Arboriculturist in the Last Year:   Transportation Needs:    Film/video editor (Medical):    Lack of Transportation (Non-Medical):   Physical Activity:    Days of Exercise per Week:    Minutes of Exercise per Session:   Stress:    Feeling of Stress :   Social Connections:    Frequency of Communication with Friends and Family:    Frequency of Social Gatherings with Friends and Family:    Attends Religious Services:    Active Member of Clubs or Organizations:    Attends Music therapist:    Marital Status:   Intimate Partner Violence:    Fear of Current or Ex-Partner:    Emotionally Abused:    Physically Abused:    Sexually Abused:    Family History  Problem Relation Age of Onset   COPD Mother    Depression Mother    Heart disease Mother    Hyperlipidemia Mother    Hypertension Mother    Cancer Mother        lung cancer, to bone   Cancer Father        skin   Heart disease Father    Hypertension Father    Hyperlipidemia Father      Learning disabilities Father    Drug abuse Brother        overdose at 53   Drug abuse Daughter        drug overdose 7   Cancer Maternal Uncle        lung cancer   Colon cancer Neg Hx     Objective: Office vital signs reviewed. BP 106/61    Pulse 84  Temp (!) 97.5 F (36.4 C)    Ht 5\' 4"  (1.626 m)    Wt 166 lb 3.2 oz (75.4 kg)    LMP 07/09/2013    SpO2 93%    BMI 28.53 kg/m   Physical Examination:  General: Awake, alert, No acute distress HEENT: Normal, sclera white.  Moist mucous membranes Cardio: regular rate and rhythm, S1S2 heard, no murmurs appreciated Pulm: Global expiratory wheezes.  Coughing intermittently.  Air movement fair.  Normal work of breathing on room air Extremities: warm, well perfused, No edema, cyanosis or clubbing; +2 pulses bilaterally MSK: normal gait and station Skin: dry; intact; no rashes or lesions Psych: Mood stable, speech normal, affect bright, pleasant and interactive  The 10-year ASCVD risk score Mikey Bussing DC Jr., et al., 2013) is: 3.5%   Values used to calculate the score:     Age: 56 years     Sex: Female     Is Non-Hispanic African American: No     Diabetic: No     Tobacco smoker: Yes     Systolic Blood Pressure: 977 mmHg     Is BP treated: No     HDL Cholesterol: 53 mg/dL     Total Cholesterol: 172 mg/dL  Assessment/ Plan: 59 y.o. female   1. Chronic cough Renewal of Flovent and albuterol sent.  Reinforced need for daily use of Flovent - albuterol (VENTOLIN HFA) 108 (90 Base) MCG/ACT inhaler; Inhale 2 puffs into the lungs every 6 (six) hours as needed for wheezing or shortness of breath.  Dispense: 6.7 g; Refill: 3 - fluticasone (FLOVENT HFA) 220 MCG/ACT inhaler; Inhale 2 puffs into the lungs 2 (two) times daily.  Dispense: 1 each; Refill: 12  2. Simple chronic bronchitis (HCC) Wheezes on exam today - albuterol (VENTOLIN HFA) 108 (90 Base) MCG/ACT inhaler; Inhale 2 puffs into the lungs every 6 (six) hours as needed for  wheezing or shortness of breath.  Dispense: 6.7 g; Refill: 3 - fluticasone (FLOVENT HFA) 220 MCG/ACT inhaler; Inhale 2 puffs into the lungs 2 (two) times daily.  Dispense: 1 each; Refill: 12  3. Gastroesophageal reflux disease Controlled - omeprazole (PRILOSEC) 40 MG capsule; Take 1 capsule (40 mg total) by mouth daily.  Dispense: 90 capsule; Refill: 3  4. Pure hypercholesterolemia Well-controlled.  We reviewed her labs today - pravastatin (PRAVACHOL) 20 MG tablet; Take 1 tablet (20 mg total) by mouth daily.  Dispense: 90 tablet; Refill: 3  5. Tobacco abuse Counseling.  Patient is contemplative   No orders of the defined types were placed in this encounter.  No orders of the defined types were placed in this encounter.    [name], DO [place] [phone]

## 2020-01-04 ENCOUNTER — Ambulatory Visit: Payer: Medicare Other | Admitting: Family Medicine

## 2020-02-26 ENCOUNTER — Encounter (HOSPITAL_COMMUNITY): Payer: Self-pay | Admitting: Psychiatry

## 2020-02-26 ENCOUNTER — Other Ambulatory Visit: Payer: Self-pay

## 2020-02-26 ENCOUNTER — Telehealth (INDEPENDENT_AMBULATORY_CARE_PROVIDER_SITE_OTHER): Payer: Medicare Other | Admitting: Psychiatry

## 2020-02-26 DIAGNOSIS — F332 Major depressive disorder, recurrent severe without psychotic features: Secondary | ICD-10-CM

## 2020-02-26 MED ORDER — TEMAZEPAM 15 MG PO CAPS: 15.0000 mg | ORAL_CAPSULE | Freq: Every evening | ORAL | 2 refills | Status: DC | PRN

## 2020-02-26 MED ORDER — PRAZOSIN HCL 5 MG PO CAPS: 5.0000 mg | ORAL_CAPSULE | Freq: Every day | ORAL | 2 refills | Status: DC

## 2020-02-26 MED ORDER — ARIPIPRAZOLE 5 MG PO TABS
5.0000 mg | ORAL_TABLET | Freq: Every day | ORAL | 2 refills | Status: DC
Start: 2020-02-26 — End: 2020-04-18

## 2020-02-26 MED ORDER — DIAZEPAM 10 MG PO TABS: 10.0000 mg | ORAL_TABLET | Freq: Three times a day (TID) | ORAL | 2 refills | Status: DC

## 2020-02-26 MED ORDER — DULOXETINE HCL 60 MG PO CPEP: 60.0000 mg | ORAL_CAPSULE | Freq: Two times a day (BID) | ORAL | 2 refills | Status: DC

## 2020-02-26 NOTE — Progress Notes (Signed)
Virtual Visit via Telephone Note  I connected with [name] on 02/26/20 at 10:40 AM EDT by telephone and verified that I am speaking with the correct person using two identifiers.  Location: Patient:home Provider: home   I discussed the limitations, risks, security and privacy concerns of performing an evaluation and management service by telephone and the availability of in person appointments. I also discussed with the patient that there may be a patient responsible charge related to this service. The patient expressed understanding and agreed to proceed.    I discussed the assessment and treatment plan with the patient. The patient was provided an opportunity to ask questions and all were answered. The patient agreed with the plan and demonstrated an understanding of the instructions.   The patient was advised to call back or seek an in-person evaluation if the symptoms worsen or if the condition fails to improve as anticipated.  I provided 15 minutes of non-face-to-face time during this encounter.   Judy Spiller, MD  [facility] MD/PA/NP OP Progress Note  02/26/2020 11:09 AM [name]  MRN:  [id number]  Chief Complaint:  Chief Complaint    Depression; Anxiety; Follow-up     HPI:    this patientis a 59 year old divorced white female who lives[place] her boyfriendin [place]. She has one son . She had a daughter who died at age 13 in Aug 25, 2011 of a narcotic overdose. The patient is on disability. She is self-referred.  The patient states that she began getting depressed in her early 15s. She admits to suicide attempts but was never hospitalized but was treated in outpatient clinics. She's been on numerous medications over the years including Celexa, Abilify, Zoloft, amitriptyline, clonazepam and Xanax. She used to go to [place] and families followed by a clinic in [place] and most recently was prescribed medication by her physician at [facility]. Her physician  left and she no longer has access to psychiatric medications. She was on a combination of Celexa, Abilify and clonazepam. She's been off medication for 6 months.  The patient states that her depression got considerably worse after her daughter died in August 25, 2011. The patient witnessed her daughter's death and did not knowwhat was going on. She watched her get drowsy through the entire day but didn't realize that she was going through an overdose. By the time she called 911 it was too late. The patient blames herself for her daughter's death. She has nightmares and flashbacks about the events of that day and can't stop thinking about that. She has not had any counseling since her daughter died.  Currently she cries all the time and has no energy. She does not eat well. She is frightened by the house and has constant panic attacks. Her sleep is disrupted by nightmares. She does not hear voices but sometimes hears noises. She's been trying to cope by drinking several shots of alcohol every few days. She claims she used to smoke marijuana but hasn't for one month. She does not use other drugs. She has a boyfriend but is isolating herself from him and does not do much with other friends or family members. She has passive suicidal ideation and "wishes I would die and be with my daughter" but denies any thoughts of self-harm  Patient returns for follow-up after about 5 months.  She states she has been up and down in mood.  She gets particularly down because her grandson who is the son of the daughter who died, has not been  willing to spend much time with her or discuss his mother.  This really hurts her feelings a lot.  She has times where she feels very depressed and at times has passive suicidal ideation but claims she would never hurt her self.  She has been compliant with her medications and is sleeping fairly well without nightmares.  The Valium continues to help her anxiety.  We discussed going back on Abilify for  augmentation since she is having some down days. Visit Diagnosis:    ICD-10-CM   1. Major depressive disorder, recurrent, severe without psychotic features ([place])  F33.2     Past Psychiatric History: Past outpatient treatment  Past Medical History:  Past Medical History:  Diagnosis Date  . Allergy   . Anxiety   . Blood transfusion without reported diagnosis   . Breast cancer ([place])   . Breast cancer ([place]) 04/21/2012   Stage II (T1c N1 M0) grade 3 triple negative breast cancer, right- sided with 1 of 5 positive nodes, status post FEC in a dose dense fashion for 6 cycles followed by radiation therapy by Dr. Sondra Come for her high-risk disease with her initial date of surgery in February 2008.   [name] Chemotherapy-induced neuropathy ([place]) 06/21/2016  . COPD (chronic obstructive pulmonary disease) ([place])   . Depression   . Emphysema of lung ([place])   . GERD (gastroesophageal reflux disease) 12/18/2012  . Headache   . Hyperlipidemia   . Neuropathy   . Osteoporosis   . PONV (postoperative nausea and vomiting)   . PTSD (post-traumatic stress disorder)   . Substance abuse [facility])     Past Surgical History:  Procedure Laterality Date  . BIOPSY  11/05/2017   Procedure: BIOPSY;  Surgeon: Danie Binder, MD;  Location: [facility] ENDO SUITE;  Service: Endoscopy;;  colon duodenum gastric  . BREAST SURGERY    . CATARACT EXTRACTION W/PHACO Left 08/15/2017   Procedure: CATARACT EXTRACTION WITH PHACOEMULSIFICATION  AND INTRAOCULAR LENS PLACEMENT LEFT EYE CDE=2.68;  Surgeon: Baruch Goldmann, MD;  Location: [facility] ORS;  Service: Ophthalmology;  Laterality: Left;  left  . CATARACT EXTRACTION W/PHACO Right 09/06/2017   Procedure: CATARACT EXTRACTION PHACO AND INTRAOCULAR LENS PLACEMENT (IOC);  Surgeon: Baruch Goldmann, MD;  Location: [facility] ORS;  Service: Ophthalmology;  Laterality: Right;  CDE: 1.61  . COLONOSCOPY WITH PROPOFOL N/A 11/05/2017    6 mm polyp in the sigmoid colon which was sessile and removed.  Rectosigmoid colon and  sigmoid colon biopsies taken for evaluation for microscopic colitis.  Intermittent rectal bleeding due to sigmoid colon polyp and internal hemorrhoids. Normal colonic biopsies. Tubular adenoma. Repeat surveillance 5-10 years.   . ESOPHAGOGASTRODUODENOSCOPY  03/10/2008   SLF: Normal esophagus without evidence of Barrett, mass, erosion  ulceration, or stricture, small bowel bx negative, gastritis with NO h.pylori  . ESOPHAGOGASTRODUODENOSCOPY (EGD) WITH PROPOFOL N/A 11/05/2017   Mild gastritis and duodenitis.   [name] leocolonoscopy  03/10/2008   FIE:PPIRJJ terminal ileum, approximately 10 cm visualized/Normal colon without evidence of polyps, mass, inflammatory changes, diverticula, or AVMs/Normal retroflexed view of the rectum. random colon bx negative.  [name] MASTECTOMY     bilateral, status post reconstruction.  [name] POLYPECTOMY  11/05/2017   Procedure: POLYPECTOMY;  Surgeon: Danie Binder, MD;  Location: [facility];  Service: Endoscopy;;  colon  . TUBAL LIGATION      Family Psychiatric History: see below  Family History:  Family History  Problem Relation Age of Onset  . COPD Mother   . Depression Mother   .  Heart disease Mother   . Hyperlipidemia Mother   . Hypertension Mother   . Cancer Mother        lung cancer, to bone  . Cancer Father        skin  . Heart disease Father   . Hypertension Father   . Hyperlipidemia Father   . Learning disabilities Father   . Drug abuse Brother        overdose at 8  . Drug abuse Daughter        drug overdose 65  . Cancer Maternal Uncle        lung cancer  . Colon cancer Neg Hx     Social History:  Social History   Socioeconomic History  . Marital status: Divorced    Spouse name: Not on file  . Number of children: 2  . Years of education: 37  . Highest education level: Not on file  Occupational History  . Occupation: disabled    Comment: neuropathy from chemo  Tobacco Use  . Smoking status: Current Every Day Smoker    Packs/day: 0.50     Years: 20.00    Pack years: 10.00    Types: Cigarettes    Start date: 05/07/1992  . Smokeless tobacco: Never Used  . Tobacco comment: discussed  Substance and Sexual Activity  . Alcohol use: Yes    Alcohol/week: 6.0 standard drinks    Types: 6 Shots of liquor per week    Comment: occ  . Drug use: No  . Sexual activity: Yes    Birth control/protection: Post-menopausal  Other Topics Concern  . Not on file  Social History Narrative   Divorced   Lives alone   Spends time with boyfriend - he is a smoker   Social Determinants of Radio broadcast assistant Strain:   . Difficulty of Paying Living Expenses: Not on file  Food Insecurity:   . Worried About Charity fundraiser in the Last Year: Not on file  . Ran Out of Food in the Last Year: Not on file  Transportation Needs:   . Lack of Transportation (Medical): Not on file  . Lack of Transportation (Non-Medical): Not on file  Physical Activity:   . Days of Exercise per Week: Not on file  . Minutes of Exercise per Session: Not on file  Stress:   . Feeling of Stress : Not on file  Social Connections:   . Frequency of Communication with Friends and Family: Not on file  . Frequency of Social Gatherings with Friends and Family: Not on file  . Attends Religious Services: Not on file  . Active Member of Clubs or Organizations: Not on file  . Attends Archivist Meetings: Not on file  . Marital Status: Not on file    Allergies:  Allergies  Allergen Reactions  . Vicodin [Hydrocodone-Acetaminophen] Nausea Only    Metabolic Disorder Labs: No results found for: HGBA1C, MPG No results found for: PROLACTIN Lab Results  Component Value Date   CHOL 172 12/18/2019   TRIG 110 12/18/2019   HDL 53 12/18/2019   CHOLHDL 3.2 12/18/2019   VLDL 42 (H) 11/23/2016   LDLCALC 99 12/18/2019   LDLCALC 103 (H) 07/08/2017   Lab Results  Component Value Date   TSH 2.620 12/18/2019   TSH 2.26 11/29/2015    Therapeutic Level  Labs: No results found for: LITHIUM No results found for: VALPROATE No components found for:  CBMZ  Current Medications: Current Outpatient  Medications  Medication Sig Dispense Refill  . albuterol (VENTOLIN HFA) 108 (90 Base) MCG/ACT inhaler Inhale 2 puffs into the lungs every 6 (six) hours as needed for wheezing or shortness of breath. 6.7 g 3  . ARIPiprazole (ABILIFY) 5 MG tablet Take 1 tablet (5 mg total) by mouth daily. 30 tablet 2  . Chlorpheniramine-DM (COUGH & COLD HBP PO) Take by mouth as needed.    . cholecalciferol (VITAMIN D) 1000 units tablet Take 1,000 Units by mouth daily.    . diazepam (VALIUM) 10 MG tablet Take 1 tablet (10 mg total) by mouth 3 (three) times daily. 90 tablet 2  . dicyclomine (BENTYL) 10 MG capsule Take 1 capsule (10 mg total) by mouth 2 (two) times daily. 120 capsule 3  . DULoxetine (CYMBALTA) 60 MG capsule Take 1 capsule (60 mg total) by mouth 2 (two) times daily. 180 capsule 2  . fexofenadine (ALLEGRA) 180 MG tablet Take 1 tablet (180 mg total) by mouth daily as needed for allergies or rhinitis. 90 tablet 3  . fluticasone (FLONASE) 50 MCG/ACT nasal spray Place 1 spray into both nostrils 2 (two) times daily as needed for allergies or rhinitis. 16 g 6  . fluticasone (FLOVENT HFA) 220 MCG/ACT inhaler Inhale 2 puffs into the lungs 2 (two) times daily. 1 each 12  . Loperamide HCl (ANTI-DIARRHEAL PO) Take by mouth as needed.    . Multiple Vitamin (MULTIVITAMIN) capsule Take 1 capsule by mouth daily.    [name] omeprazole (PRILOSEC) 40 MG capsule Take 1 capsule (40 mg total) by mouth daily. 90 capsule 3  . pravastatin (PRAVACHOL) 20 MG tablet Take 1 tablet (20 mg total) by mouth daily. 90 tablet 3  . prazosin (MINIPRESS) 5 MG capsule Take 1 capsule (5 mg total) by mouth at bedtime. 30 capsule 2  . temazepam (RESTORIL) 15 MG capsule Take 1 capsule (15 mg total) by mouth at bedtime as needed for sleep. 30 capsule 2  . triamcinolone cream (KENALOG) 0.1 % APPLY 1 APPLICATION  TOPICALLY TWICE DAILY UNTIL RASH CLEARS (7 TO 10 DAYS) 30 g 0  . vitamin C (ASCORBIC ACID) 500 MG tablet Take 500 mg by mouth daily.     No current facility-administered medications for this visit.     Musculoskeletal: Strength & Muscle Tone: within normal limits Gait & Station: normal Patient leans: N/A  Psychiatric Specialty Exam: Review of Systems  Psychiatric/Behavioral: Positive for dysphoric mood.  All other systems reviewed and are negative.   Last menstrual period 07/09/2013.There is no height or weight on file to calculate BMI.  General Appearance: NA  Eye Contact:  NA  Speech:  Clear and Coherent  Volume:  Normal  Mood:  Dysphoric  Affect:  NA  Thought Process:  Goal Directed  Orientation:  Full (Time, Place, and Person)  Thought Content: Rumination   Suicidal Thoughts:  No  Homicidal Thoughts:  No  Memory:  Immediate;   Good Recent;   Good Remote;   Good  Judgement:  Good  Insight:  Good  Psychomotor Activity:  Decreased  Concentration:  Concentration: Good and Attention Span: Good  Recall:  Good  Fund of Knowledge: Good  Language: Good  Akathisia:  No  Handed:  Right  AIMS (if indicated): not done  Assets:  Communication Skills Desire for Improvement Physical Health Resilience Social Support Talents/Skills  ADL's:  Intact  Cognition: WNL  Sleep:  Fair   Screenings: PHQ2-9     Office Visit from 12/21/2019 in [place]  Family Medicine Office Visit from 03/14/2018 in [place] Visit from 09/02/2017 in [place] Visit from 11/23/2016 in [facility] Office Visit from 06/21/2016 in [facility]  PHQ-2 Total Score 3 4 4  0 4  PHQ-9 Total Score 13 18 9  -- 14       Assessment and Plan: Patient is a 59 year old female with a history of depression anxiety and PTSD.  Is still very difficult for her to deal with the loss of her daughter.  Since she has had some more  down days recently we will add Abilify 5 mg for augmentation along with Cymbalta 60 mg twice daily for depression, Valium 10 mg 3 times daily for anxiety, Restoril 15 mg at bedtime for sleep and prazosin 5 mg at bedtime to prevent.  She will return to see me in 6 weeks   Judy Spiller, MD 02/26/2020, 11:09 AM

## 2020-03-21 ENCOUNTER — Telehealth: Payer: Medicare Other | Admitting: *Deleted

## 2020-04-07 ENCOUNTER — Other Ambulatory Visit: Payer: Self-pay | Admitting: Gastroenterology

## 2020-04-18 ENCOUNTER — Other Ambulatory Visit: Payer: Self-pay

## 2020-04-18 ENCOUNTER — Encounter (HOSPITAL_COMMUNITY): Payer: Self-pay | Admitting: Psychiatry

## 2020-04-18 ENCOUNTER — Telehealth (INDEPENDENT_AMBULATORY_CARE_PROVIDER_SITE_OTHER): Payer: Medicare Other | Admitting: Psychiatry

## 2020-04-18 DIAGNOSIS — F332 Major depressive disorder, recurrent severe without psychotic features: Secondary | ICD-10-CM | POA: Diagnosis not present

## 2020-04-18 MED ORDER — ARIPIPRAZOLE 5 MG PO TABS: 5.0000 mg | ORAL_TABLET | Freq: Every day | ORAL | 2 refills | Status: DC

## 2020-04-18 MED ORDER — PRAZOSIN HCL 5 MG PO CAPS: 5.0000 mg | ORAL_CAPSULE | Freq: Every day | ORAL | 2 refills | Status: DC

## 2020-04-18 MED ORDER — DIAZEPAM 10 MG PO TABS: 10.0000 mg | ORAL_TABLET | Freq: Three times a day (TID) | ORAL | 2 refills | Status: DC

## 2020-04-18 MED ORDER — DULOXETINE HCL 60 MG PO CPEP: 60.0000 mg | ORAL_CAPSULE | Freq: Two times a day (BID) | ORAL | 2 refills | Status: DC

## 2020-04-18 MED ORDER — TEMAZEPAM 15 MG PO CAPS: 15.0000 mg | ORAL_CAPSULE | Freq: Every evening | ORAL | 2 refills | Status: DC | PRN

## 2020-04-18 NOTE — Progress Notes (Signed)
Virtual Visit via Telephone Note  I connected with [name] on 04/18/20 at  1:40 PM EST by telephone and verified that I am speaking with the correct person using two identifiers.  Location: Patient: home Provider: office   I discussed the limitations, risks, security and privacy concerns of performing an evaluation and management service by telephone and the availability of in person appointments. I also discussed with the patient that there may be a patient responsible charge related to this service. The patient expressed understanding and agreed to proceed.     I discussed the assessment and treatment plan with the patient. The patient was provided an opportunity to ask questions and all were answered. The patient agreed with the plan and demonstrated an understanding of the instructions.   The patient was advised to call back or seek an in-person evaluation if the symptoms worsen or if the condition fails to improve as anticipated.  I provided 15 minutes of non-face-to-face time during this encounter.   [name], MD  [facility] MD/PA/NP OP Progress Note  04/18/2020 1:58 PM [name]  MRN:  [id number]  Chief Complaint:  Chief Complaint    Anxiety; Follow-up; Depression     HPI: this patientis a 59 year old divorced white female who liveswith her boyfriendin [place]. She has one son . She had a daughter who died at age 12 in 08/12/11 of a narcotic overdose. The patient is on disability  The patient returns for follow-up after 3 months regarding her depression and anxiety.  She states she is doing okay and perhaps a little bit better since we added Abilify back to her regimen.  However she states she goes 2 days where she just "does not care about anything."  She states that she will wear the same clothes day after day and maybe not take showers for several days.  She claims that she just does not care that much about herself although she is not suicidal.  She has spent  holidays with her family lately which she enjoyed.  She is finding it hard to get motivated to do a whole lot.  Since she is already on a lot of medication I suggested that she get back into therapy and she agrees. Visit Diagnosis:    ICD-10-CM   1. Major depressive disorder, recurrent, severe without psychotic features ([place])  F33.2     Past Psychiatric History: Past outpatient treatment  Past Medical History:  Past Medical History:  Diagnosis Date  . Allergy   . Anxiety   . Blood transfusion without reported diagnosis   . Breast cancer ([place])   . Breast cancer ([place]) 04/21/2012   Stage II (T1c N1 M0) grade 3 triple negative breast cancer, right- sided with 1 of 5 positive nodes, status post FEC in a dose dense fashion for 6 cycles followed by radiation therapy by Dr. Sondra Come for her high-risk disease with her initial date of surgery in February 2008.   [name] Chemotherapy-induced neuropathy ([place]) 06/21/2016  . COPD (chronic obstructive pulmonary disease) ([place])   . Depression   . Emphysema of lung ([place])   . GERD (gastroesophageal reflux disease) 12/18/2012  . Headache   . Hyperlipidemia   . Neuropathy   . Osteoporosis   . PONV (postoperative nausea and vomiting)   . PTSD (post-traumatic stress disorder)   . Substance abuse [facility])     Past Surgical History:  Procedure Laterality Date  . BIOPSY  11/05/2017   Procedure: BIOPSY;  Surgeon: Barney Drain  L, MD;  Location: [facility] ENDO SUITE;  Service: Endoscopy;;  colon duodenum gastric  . BREAST SURGERY    . CATARACT EXTRACTION W/PHACO Left 08/15/2017   Procedure: CATARACT EXTRACTION WITH PHACOEMULSIFICATION  AND INTRAOCULAR LENS PLACEMENT LEFT EYE CDE=2.68;  Surgeon: Baruch Goldmann, MD;  Location: [facility] ORS;  Service: Ophthalmology;  Laterality: Left;  left  . CATARACT EXTRACTION W/PHACO Right 09/06/2017   Procedure: CATARACT EXTRACTION PHACO AND INTRAOCULAR LENS PLACEMENT (IOC);  Surgeon: Baruch Goldmann, MD;  Location: [facility] ORS;  Service: Ophthalmology;   Laterality: Right;  CDE: 1.61  . COLONOSCOPY WITH PROPOFOL N/A 11/05/2017    6 mm polyp in the sigmoid colon which was sessile and removed.  Rectosigmoid colon and sigmoid colon biopsies taken for evaluation for microscopic colitis.  Intermittent rectal bleeding due to sigmoid colon polyp and internal hemorrhoids. Normal colonic biopsies. Tubular adenoma. Repeat surveillance 5-10 years.   . ESOPHAGOGASTRODUODENOSCOPY  03/10/2008   SLF: Normal esophagus without evidence of Barrett, mass, erosion  ulceration, or stricture, small bowel bx negative, gastritis with NO h.pylori  . ESOPHAGOGASTRODUODENOSCOPY (EGD) WITH PROPOFOL N/A 11/05/2017   Mild gastritis and duodenitis.   [name] leocolonoscopy  03/10/2008   GQQ:PYPPJK terminal ileum, approximately 10 cm visualized/Normal colon without evidence of polyps, mass, inflammatory changes, diverticula, or AVMs/Normal retroflexed view of the rectum. random colon bx negative.  [name] MASTECTOMY     bilateral, status post reconstruction.  [name] POLYPECTOMY  11/05/2017   Procedure: POLYPECTOMY;  Surgeon: Danie Binder, MD;  Location: [facility] ENDO SUITE;  Service: Endoscopy;;  colon  . TUBAL LIGATION      Family Psychiatric History: see below  Family History:  Family History  Problem Relation Age of Onset  . COPD Mother   . Depression Mother   . Heart disease Mother   . Hyperlipidemia Mother   . Hypertension Mother   . Cancer Mother        lung cancer, to bone  . Cancer Father        skin  . Heart disease Father   . Hypertension Father   . Hyperlipidemia Father   . Learning disabilities Father   . Drug abuse Brother        overdose at 16  . Drug abuse Daughter        drug overdose 62  . Cancer Maternal Uncle        lung cancer  . Colon cancer Neg Hx     Social History:  Social History   Socioeconomic History  . Marital status: Divorced    Spouse name: Not on file  . Number of children: 2  . Years of education: 77  . Highest education level: Not on file   Occupational History  . Occupation: disabled    Comment: neuropathy from chemo  Tobacco Use  . Smoking status: Current Every Day Smoker    Packs/day: 0.50    Years: 20.00    Pack years: 10.00    Types: Cigarettes    Start date: 05/07/1992  . Smokeless tobacco: Never Used  . Tobacco comment: discussed  Substance and Sexual Activity  . Alcohol use: Yes    Alcohol/week: 6.0 standard drinks    Types: 6 Shots of liquor per week    Comment: occ  . Drug use: No  . Sexual activity: Yes    Birth control/protection: Post-menopausal  Other Topics Concern  . Not on file  Social History Narrative   Divorced   Lives alone   Spends time with  boyfriend - he is a smoker   Oncologist: Not on file  Food Insecurity: Not on file  Transportation Needs: Not on file  Physical Activity: Not on file  Stress: Not on file  Social Connections: Not on file    Allergies:  Allergies  Allergen Reactions  . Vicodin [Hydrocodone-Acetaminophen] Nausea Only    Metabolic Disorder Labs: No results found for: HGBA1C, MPG No results found for: PROLACTIN Lab Results  Component Value Date   CHOL 172 12/18/2019   TRIG 110 12/18/2019   HDL 53 12/18/2019   CHOLHDL 3.2 12/18/2019   VLDL 42 (H) 11/23/2016   LDLCALC 99 12/18/2019   LDLCALC 103 (H) 07/08/2017   Lab Results  Component Value Date   TSH 2.620 12/18/2019   TSH 2.26 11/29/2015    Therapeutic Level Labs: No results found for: LITHIUM No results found for: VALPROATE No components found for:  CBMZ  Current Medications: Current Outpatient Medications  Medication Sig Dispense Refill  . albuterol (VENTOLIN HFA) 108 (90 Base) MCG/ACT inhaler Inhale 2 puffs into the lungs every 6 (six) hours as needed for wheezing or shortness of breath. 6.7 g 3  . ARIPiprazole (ABILIFY) 5 MG tablet Take 1 tablet (5 mg total) by mouth daily. 30 tablet 2  . Chlorpheniramine-DM (COUGH & COLD HBP PO) Take by mouth  as needed.    . cholecalciferol (VITAMIN D) 1000 units tablet Take 1,000 Units by mouth daily.    . diazepam (VALIUM) 10 MG tablet Take 1 tablet (10 mg total) by mouth 3 (three) times daily. 90 tablet 2  . dicyclomine (BENTYL) 10 MG capsule Take 1 capsule by mouth twice daily 180 capsule 0  . DULoxetine (CYMBALTA) 60 MG capsule Take 1 capsule (60 mg total) by mouth 2 (two) times daily. 180 capsule 2  . fexofenadine (ALLEGRA) 180 MG tablet Take 1 tablet (180 mg total) by mouth daily as needed for allergies or rhinitis. 90 tablet 3  . fluticasone (FLONASE) 50 MCG/ACT nasal spray Place 1 spray into both nostrils 2 (two) times daily as needed for allergies or rhinitis. 16 g 6  . fluticasone (FLOVENT HFA) 220 MCG/ACT inhaler Inhale 2 puffs into the lungs 2 (two) times daily. 1 each 12  . Loperamide HCl (ANTI-DIARRHEAL PO) Take by mouth as needed.    . Multiple Vitamin (MULTIVITAMIN) capsule Take 1 capsule by mouth daily.    [name] omeprazole (PRILOSEC) 40 MG capsule Take 1 capsule (40 mg total) by mouth daily. 90 capsule 3  . pravastatin (PRAVACHOL) 20 MG tablet Take 1 tablet (20 mg total) by mouth daily. 90 tablet 3  . prazosin (MINIPRESS) 5 MG capsule Take 1 capsule (5 mg total) by mouth at bedtime. 30 capsule 2  . temazepam (RESTORIL) 15 MG capsule Take 1 capsule (15 mg total) by mouth at bedtime as needed for sleep. 30 capsule 2  . triamcinolone cream (KENALOG) 0.1 % APPLY 1 APPLICATION TOPICALLY TWICE DAILY UNTIL RASH CLEARS (7 TO 10 DAYS) 30 g 0  . vitamin C (ASCORBIC ACID) 500 MG tablet Take 500 mg by mouth daily.     No current facility-administered medications for this visit.     Musculoskeletal: Strength & Muscle Tone: within normal limits Gait & Station: normal Patient leans: N/A  Psychiatric Specialty Exam: Review of Systems  Constitutional: Positive for fatigue and unexpected weight change.  Psychiatric/Behavioral: Positive for dysphoric mood.  All other systems reviewed and are  negative.  Last menstrual period 07/09/2013.There is no height or weight on file to calculate BMI.  General Appearance: NA  Eye Contact:  NA  Speech:  Clear and Coherent  Volume:  Normal  Mood:  Dysphoric  Affect:  NA  Thought Process:  Goal Directed  Orientation:  Full (Time, Place, and Person)  Thought Content: Rumination   Suicidal Thoughts:  No  Homicidal Thoughts:  No  Memory:  Immediate;   Good Recent;   Good Remote;   Fair  Judgement:  Fair  Insight:  Fair  Psychomotor Activity:  Decreased  Concentration:  Concentration: Good and Attention Span: Good  Recall:  Good  Fund of Knowledge: Good  Language: Good  Akathisia:  No  Handed:  Right  AIMS (if indicated): not done  Assets:  Communication Skills Desire for Improvement Physical Health Resilience Social Support Talents/Skills  ADL's:  Intact  Cognition: WNL  Sleep:  Good   Screenings: PHQ2-9   [place] Office Visit from 12/21/2019 in [place] Visit from 03/14/2018 in [place] Office Visit from 09/02/2017 in [place] Office Visit from 11/23/2016 in [facility] Office Visit from 06/21/2016 in [facility]  PHQ-2 Total Score 3 4 4  0 4  PHQ-9 Total Score 13 18 9  -- 14       Assessment and Plan: This patient is a 59 year old female with a history of depression anxiety and PTSD.  She seems to have developed very low self-esteem.  We will get her in for counseling.  For now she will continue Abilify 5 mg daily for augmentation along with Cymbalta 60 mg twice daily for depression, Valium 10 mg 3 times daily for anxiety and Restoril 15 mg at bedtime for sleep and prazosin 5 mg at bedtime to prevent nightmares.  She will return to see me in 3 months.   [name], MD 04/18/2020, 1:58 PM

## 2020-05-11 ENCOUNTER — Other Ambulatory Visit: Payer: Self-pay

## 2020-05-11 ENCOUNTER — Ambulatory Visit (INDEPENDENT_AMBULATORY_CARE_PROVIDER_SITE_OTHER): Payer: Medicare Other

## 2020-05-11 DIAGNOSIS — Z23 Encounter for immunization: Secondary | ICD-10-CM

## 2020-05-16 ENCOUNTER — Telehealth (HOSPITAL_COMMUNITY): Payer: Self-pay | Admitting: Psychiatry

## 2020-05-16 NOTE — Telephone Encounter (Signed)
Called to schedule f/u appt, left vm 

## 2020-06-13 ENCOUNTER — Ambulatory Visit: Payer: Medicare Other | Admitting: Family Medicine

## 2020-07-20 ENCOUNTER — Other Ambulatory Visit: Payer: Self-pay

## 2020-07-20 ENCOUNTER — Encounter (HOSPITAL_COMMUNITY): Payer: Self-pay | Admitting: Psychiatry

## 2020-07-20 ENCOUNTER — Telehealth (INDEPENDENT_AMBULATORY_CARE_PROVIDER_SITE_OTHER): Payer: Medicare Other | Admitting: Psychiatry

## 2020-07-20 DIAGNOSIS — F332 Major depressive disorder, recurrent severe without psychotic features: Secondary | ICD-10-CM | POA: Diagnosis not present

## 2020-07-20 MED ORDER — TEMAZEPAM 15 MG PO CAPS: 15.0000 mg | ORAL_CAPSULE | Freq: Every evening | ORAL | 2 refills | Status: DC | PRN

## 2020-07-20 MED ORDER — PRAZOSIN HCL 5 MG PO CAPS: 5.0000 mg | ORAL_CAPSULE | Freq: Every day | ORAL | 2 refills | Status: DC

## 2020-07-20 MED ORDER — ARIPIPRAZOLE 5 MG PO TABS: 7.5000 mg | ORAL_TABLET | Freq: Every day | ORAL | 2 refills | Status: DC

## 2020-07-20 MED ORDER — DULOXETINE HCL 60 MG PO CPEP: 60.0000 mg | ORAL_CAPSULE | Freq: Two times a day (BID) | ORAL | 2 refills | Status: DC

## 2020-07-20 MED ORDER — DIAZEPAM 10 MG PO TABS: 10.0000 mg | ORAL_TABLET | Freq: Three times a day (TID) | ORAL | 2 refills | Status: DC

## 2020-07-20 NOTE — Progress Notes (Signed)
Virtual Visit via Telephone Note  I connected with [name] on 07/20/20 at  1:20 PM EDT by telephone and verified that I am speaking with the correct person using two identifiers.  Location: Patient: home Provider: home   I discussed the limitations, risks, security and privacy concerns of performing an evaluation and management service by telephone and the availability of in person appointments. I also discussed with the patient that there may be a patient responsible charge related to this service. The patient expressed understanding and agreed to proceed.     I discussed the assessment and treatment plan with the patient. The patient was provided an opportunity to ask questions and all were answered. The patient agreed with the plan and demonstrated an understanding of the instructions.   The patient was advised to call back or seek an in-person evaluation if the symptoms worsen or if the condition fails to improve as anticipated.  I provided 15 minutes of non-face-to-face time during this encounter.   [name], MD  [facility] MD/PA/NP OP Progress Note  07/20/2020 1:34 PM [name]  MRN:  [id number]  Chief Complaint:  Chief Complaint    Anxiety; Depression; Follow-up     HPI: this patientis a 60 year old divorced white female who [place]in [place]. She has one son . She had a daughter who died at age 39 in 07-31-2011 of a narcotic overdose. The patient is on disability  The patient returns for follow-up after 3 months regarding her depression and anxiety.  She states that she went on a trip to [facility] with her son and had a wonderful time in February.  However since coming back her mood has been up and down again.  She is having nightmares again even though she is on prazosin at bedtime.  She has good days and bad.  She is already on a lot of medication but I offered to increase Abilify for augmentation a little bit.  Last time I wanted to get into  counseling but for some reason this was not scheduled.  Even though she claims to be depressed she sounded very upbeat today.  She is very excited about her son and his girlfriend expecting a baby in the spring.  She denies suicidal ideation. Visit Diagnosis:    ICD-10-CM   1. Major depressive disorder, recurrent, severe without psychotic features ([place])  F33.2     Past Psychiatric History: Past outpatient treatment  Past Medical History:  Past Medical History:  Diagnosis Date  . Allergy   . Anxiety   . Blood transfusion without reported diagnosis   . Breast cancer ([place])   . Breast cancer ([place]) 04/21/2012   Stage II (T1c N1 M0) grade 3 triple negative breast cancer, right- sided with 1 of 5 positive nodes, status post FEC in a dose dense fashion for 6 cycles followed by radiation therapy by Dr. Sondra Come for her high-risk disease with her initial date of surgery in February 2008.   [name] Chemotherapy-induced neuropathy ([place]) 06/21/2016  . COPD (chronic obstructive pulmonary disease) ([place])   . Depression   . Emphysema of lung ([place])   . GERD (gastroesophageal reflux disease) 12/18/2012  . Headache   . Hyperlipidemia   . Neuropathy   . Osteoporosis   . PONV (postoperative nausea and vomiting)   . PTSD (post-traumatic stress disorder)   . Substance abuse [facility])     Past Surgical History:  Procedure Laterality Date  . BIOPSY  11/05/2017   Procedure:  BIOPSY;  Surgeon: Danie Binder, MD;  Location: [facility] ENDO SUITE;  Service: Endoscopy;;  colon duodenum gastric  . BREAST SURGERY    . CATARACT EXTRACTION W/PHACO Left 08/15/2017   Procedure: CATARACT EXTRACTION WITH PHACOEMULSIFICATION  AND INTRAOCULAR LENS PLACEMENT LEFT EYE CDE=2.68;  Surgeon: Baruch Goldmann, MD;  Location: [facility] ORS;  Service: Ophthalmology;  Laterality: Left;  left  . CATARACT EXTRACTION W/PHACO Right 09/06/2017   Procedure: CATARACT EXTRACTION PHACO AND INTRAOCULAR LENS PLACEMENT (IOC);  Surgeon: Baruch Goldmann, MD;  Location: [facility]  ORS;  Service: Ophthalmology;  Laterality: Right;  CDE: 1.61  . COLONOSCOPY WITH PROPOFOL N/A 11/05/2017    6 mm polyp in the sigmoid colon which was sessile and removed.  Rectosigmoid colon and sigmoid colon biopsies taken for evaluation for microscopic colitis.  Intermittent rectal bleeding due to sigmoid colon polyp and internal hemorrhoids. Normal colonic biopsies. Tubular adenoma. Repeat surveillance 5-10 years.   . ESOPHAGOGASTRODUODENOSCOPY  03/10/2008   SLF: Normal esophagus without evidence of Barrett, mass, erosion  ulceration, or stricture, small bowel bx negative, gastritis with NO h.pylori  . ESOPHAGOGASTRODUODENOSCOPY (EGD) WITH PROPOFOL N/A 11/05/2017   Mild gastritis and duodenitis.   [name] leocolonoscopy  03/10/2008   HEN:IDPOEU terminal ileum, approximately 10 cm visualized/Normal colon without evidence of polyps, mass, inflammatory changes, diverticula, or AVMs/Normal retroflexed view of the rectum. random colon bx negative.  [name] MASTECTOMY     bilateral, status post reconstruction.  [name] POLYPECTOMY  11/05/2017   Procedure: POLYPECTOMY;  Surgeon: Danie Binder, MD;  Location: [facility] ENDO SUITE;  Service: Endoscopy;;  colon  . TUBAL LIGATION      Family Psychiatric History: see below  Family History:  Family History  Problem Relation Age of Onset  . COPD Mother   . Depression Mother   . Heart disease Mother   . Hyperlipidemia Mother   . Hypertension Mother   . Cancer Mother        lung cancer, to bone  . Cancer Father        skin  . Heart disease Father   . Hypertension Father   . Hyperlipidemia Father   . Learning disabilities Father   . Drug abuse Brother        overdose at 75  . Drug abuse Daughter        drug overdose 1  . Cancer Maternal Uncle        lung cancer  . Colon cancer Neg Hx     Social History:  Social History   Socioeconomic History  . Marital status: Divorced    Spouse name: Not on file  . Number of children: 2  . Years of education: 41  .  Highest education level: Not on file  Occupational History  . Occupation: disabled    Comment: neuropathy from chemo  Tobacco Use  . Smoking status: Current Every Day Smoker    Packs/day: 0.50    Years: 20.00    Pack years: 10.00    Types: Cigarettes    Start date: 05/07/1992  . Smokeless tobacco: Never Used  . Tobacco comment: discussed  Substance and Sexual Activity  . Alcohol use: Yes    Alcohol/week: 6.0 standard drinks    Types: 6 Shots of liquor per week    Comment: occ  . Drug use: No  . Sexual activity: Yes    Birth control/protection: Post-menopausal  Other Topics Concern  . Not on file  Social History Narrative   Divorced   Lives alone  Spends time with boyfriend - he is a smoker   Social Determinants of Sales executive: Not on file  Food Insecurity: Not on file  Transportation Needs: Not on file  Physical Activity: Not on file  Stress: Not on file  Social Connections: Not on file    Allergies:  Allergies  Allergen Reactions  . Vicodin [Hydrocodone-Acetaminophen] Nausea Only    Metabolic Disorder Labs: No results found for: HGBA1C, MPG No results found for: PROLACTIN Lab Results  Component Value Date   CHOL 172 12/18/2019   TRIG 110 12/18/2019   HDL 53 12/18/2019   CHOLHDL 3.2 12/18/2019   VLDL 42 (H) 11/23/2016   LDLCALC 99 12/18/2019   LDLCALC 103 (H) 07/08/2017   Lab Results  Component Value Date   TSH 2.620 12/18/2019   TSH 2.26 11/29/2015    Therapeutic Level Labs: No results found for: LITHIUM No results found for: VALPROATE No components found for:  CBMZ  Current Medications: Current Outpatient Medications  Medication Sig Dispense Refill  . albuterol (VENTOLIN HFA) 108 (90 Base) MCG/ACT inhaler Inhale 2 puffs into the lungs every 6 (six) hours as needed for wheezing or shortness of breath. 6.7 g 3  . ARIPiprazole (ABILIFY) 5 MG tablet Take 1.5 tablets (7.5 mg total) by mouth daily. 45 tablet 2  .  Chlorpheniramine-DM (COUGH & COLD HBP PO) Take by mouth as needed.    . cholecalciferol (VITAMIN D) 1000 units tablet Take 1,000 Units by mouth daily.    . diazepam (VALIUM) 10 MG tablet Take 1 tablet (10 mg total) by mouth 3 (three) times daily. 90 tablet 2  . dicyclomine (BENTYL) 10 MG capsule Take 1 capsule by mouth twice daily 180 capsule 0  . DULoxetine (CYMBALTA) 60 MG capsule Take 1 capsule (60 mg total) by mouth 2 (two) times daily. 180 capsule 2  . fexofenadine (ALLEGRA) 180 MG tablet Take 1 tablet (180 mg total) by mouth daily as needed for allergies or rhinitis. 90 tablet 3  . fluticasone (FLONASE) 50 MCG/ACT nasal spray Place 1 spray into both nostrils 2 (two) times daily as needed for allergies or rhinitis. 16 g 6  . fluticasone (FLOVENT HFA) 220 MCG/ACT inhaler Inhale 2 puffs into the lungs 2 (two) times daily. 1 each 12  . Loperamide HCl (ANTI-DIARRHEAL PO) Take by mouth as needed.    . Multiple Vitamin (MULTIVITAMIN) capsule Take 1 capsule by mouth daily.    [name] omeprazole (PRILOSEC) 40 MG capsule Take 1 capsule (40 mg total) by mouth daily. 90 capsule 3  . pravastatin (PRAVACHOL) 20 MG tablet Take 1 tablet (20 mg total) by mouth daily. 90 tablet 3  . prazosin (MINIPRESS) 5 MG capsule Take 1 capsule (5 mg total) by mouth at bedtime. 30 capsule 2  . temazepam (RESTORIL) 15 MG capsule Take 1 capsule (15 mg total) by mouth at bedtime as needed for sleep. 30 capsule 2  . triamcinolone cream (KENALOG) 0.1 % APPLY 1 APPLICATION TOPICALLY TWICE DAILY UNTIL RASH CLEARS (7 TO 10 DAYS) 30 g 0  . vitamin C (ASCORBIC ACID) 500 MG tablet Take 500 mg by mouth daily.     No current facility-administered medications for this visit.     Musculoskeletal: Strength & Muscle Tone: within normal limits Gait & Station: normal Patient leans: N/A  Psychiatric Specialty Exam: Review of Systems  Psychiatric/Behavioral: Positive for dysphoric mood and sleep disturbance.  All other systems reviewed and  are negative.   Last menstrual  period 07/09/2013.There is no height or weight on file to calculate BMI.  General Appearance: NA  Eye Contact:  NA  Speech:  Clear and Coherent  Volume:  Normal  Mood:  Dysphoric  Affect:  NA  Thought Process:  Goal Directed  Orientation:  Full (Time, Place, and Person)  Thought Content: Rumination   Suicidal Thoughts:  No  Homicidal Thoughts:  No  Memory:  Immediate;   Good Recent;   Good Remote;   Good  Judgement:  Good  Insight:  Fair  Psychomotor Activity:  Decreased  Concentration:  Concentration: Good and Attention Span: Good  Recall:  Good  Fund of Knowledge: Good  Language: Good  Akathisia:  No  Handed:  Right  AIMS (if indicated): not done  Assets:  Communication Skills Desire for Improvement Resilience Social Support Talents/Skills  ADL's:  Intact  Cognition: WNL  Sleep:  Fair   Screenings: PHQ2-9   Flowsheet Row Video Visit from 07/20/2020 in [place] Office Visit from 12/21/2019 in [place] Office Visit from 03/14/2018 in [place] Office Visit from 09/02/2017 in [place] Office Visit from 11/23/2016 in [facility]  PHQ-2 Total Score 2 3 4 4  0  PHQ-9 Total Score 9 13 18 9  -    Flowsheet Row Video Visit from 07/20/2020 in [place] No Risk       Assessment and Plan: This patient is a 60 year old female with a history of depression anxiety and PTSD.  Again we need to get her in for counseling.  I will increase her Abilify to 7.5 mg daily for augmentation along with Cymbalta 60 mg twice daily for depression, Valium 10 mg 3 times daily for anxiety Restoril 15 mg at bedtime for sleep and prazosin 5 mg at bedtime to prevent nightmares.  She will return to see me in 2 months   [name], MD 07/20/2020, 1:34 PM

## 2020-07-21 ENCOUNTER — Encounter (HOSPITAL_COMMUNITY): Payer: Self-pay

## 2020-08-19 DIAGNOSIS — H5213 Myopia, bilateral: Secondary | ICD-10-CM | POA: Diagnosis not present

## 2020-09-14 ENCOUNTER — Encounter: Payer: Self-pay | Admitting: Family Medicine

## 2020-09-14 ENCOUNTER — Ambulatory Visit (INDEPENDENT_AMBULATORY_CARE_PROVIDER_SITE_OTHER): Payer: Medicare Other | Admitting: Family Medicine

## 2020-09-14 DIAGNOSIS — Z23 Encounter for immunization: Secondary | ICD-10-CM

## 2020-09-14 DIAGNOSIS — F17200 Nicotine dependence, unspecified, uncomplicated: Secondary | ICD-10-CM

## 2020-09-14 MED ORDER — VARENICLINE TARTRATE 1 MG PO TABS: 1.0000 mg | ORAL_TABLET | Freq: Two times a day (BID) | ORAL | 2 refills | Status: DC

## 2020-09-14 MED ORDER — CHANTIX STARTING MONTH PAK 0.5 MG X 11 & 1 MG X 42 PO TABS: ORAL_TABLET | ORAL | 0 refills | Status: DC

## 2020-09-14 MED ORDER — TETANUS-DIPHTH-ACELL PERTUSSIS 5-2.5-18.5 LF-MCG/0.5 IM SUSP: 0.5000 mL | Freq: Once | INTRAMUSCULAR | 0 refills | Status: AC

## 2020-09-14 NOTE — Progress Notes (Signed)
Telephone visit  Subjective: CC: smoking cessation PCP: [name], DO CZY:SAYTKZ I [name] is a 60 y.o. female calls for telephone consult today. Patient provides verbal consent for consult held via phone.  Due to COVID-19 pandemic this visit was conducted virtually. This visit type was conducted due to national recommendations for restrictions regarding the COVID-19 Pandemic (e.g. social distancing, sheltering in place) in an effort to limit this patient's exposure and mitigate transmission in our community. All issues noted in this document were discussed and addressed.  A physical exam was not performed with this format.   Location of patient: home Location of provider: [facility] Others present for call: none  1. Tobacco use disorder She smokes 1 ppd and has done so since age 73.  She reports smoker's cough and dyspnea on exertion.   She has used patches (caused chest pain/ palpitations), gum, lozenges.  She was prescribed chantix but never used them.  She would like to start this however.  No history of epilepsy.  She is treated by psychiatry but things are stable.   She also notes that she needs an updated tetanus with diphtheria and acellular pertussis she has a new grandbaby.   ROS: Per HPI  Allergies  Allergen Reactions  . Vicodin [Hydrocodone-Acetaminophen] Nausea Only   Past Medical History:  Diagnosis Date  . Allergy   . Anxiety   . Blood transfusion without reported diagnosis   . Breast cancer ([place])   . Breast cancer ([place]) 04/21/2012   Stage II (T1c N1 M0) grade 3 triple negative breast cancer, right- sided with 1 of 5 positive nodes, status post FEC in a dose dense fashion for 6 cycles followed by radiation therapy by Dr. Sondra Come for her high-risk disease with her initial date of surgery in February 2008.   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 COPD (chronic obstructive pulmonary disease) ([place])   . Depression   . Emphysema of lung ([place])   . GERD  (gastroesophageal reflux disease) 12/18/2012  . Headache   . Hyperlipidemia   . Neuropathy   . Osteoporosis   . PONV (postoperative nausea and vomiting)   . PTSD (post-traumatic stress disorder)   . Substance abuse ([place])     Current Outpatient Medications:  .  albuterol (VENTOLIN HFA) 108 (90 Base) MCG/ACT inhaler, Inhale 2 puffs into the lungs every 6 (six) hours as needed for wheezing or shortness of breath., Disp: 6.7 g, Rfl: 3 .  ARIPiprazole (ABILIFY) 5 MG tablet, Take 1.5 tablets (7.5 mg total) by mouth daily., Disp: 45 tablet, Rfl: 2 .  Chlorpheniramine-DM (COUGH & COLD HBP PO), Take by mouth as needed., Disp: , Rfl:  .  cholecalciferol (VITAMIN D) 1000 units tablet, Take 1,000 Units by mouth daily., Disp: , Rfl:  .  diazepam (VALIUM) 10 MG tablet, Take 1 tablet (10 mg total) by mouth 3 (three) times daily., Disp: 90 tablet, Rfl: 2 .  dicyclomine (BENTYL) 10 MG capsule, Take 1 capsule by mouth twice daily, Disp: 180 capsule, Rfl: 0 .  DULoxetine (CYMBALTA) 60 MG capsule, Take 1 capsule (60 mg total) by mouth 2 (two) times daily., Disp: 180 capsule, Rfl: 2 .  fexofenadine (ALLEGRA) 180 MG tablet, Take 1 tablet (180 mg total) by mouth daily as needed for allergies or rhinitis., Disp: 90 tablet, Rfl: 3 .  fluticasone (FLONASE) 50 MCG/ACT nasal spray, Place 1 spray into both nostrils 2 (two) times daily as needed for allergies or rhinitis., Disp: 16 g, Rfl: 6 .  fluticasone (FLOVENT HFA) 220 MCG/ACT inhaler, Inhale 2 puffs into the lungs 2 (two) times daily., Disp: 1 each, Rfl: 12 .  Loperamide HCl (ANTI-DIARRHEAL PO), Take by mouth as needed., Disp: , Rfl:  .  Multiple Vitamin (MULTIVITAMIN) capsule, Take 1 capsule by mouth daily., Disp: , Rfl:  .  omeprazole (PRILOSEC) 40 MG capsule, Take 1 capsule (40 mg total) by mouth daily., Disp: 90 capsule, Rfl: 3 .  pravastatin (PRAVACHOL) 20 MG tablet, Take 1 tablet (20 mg total) by mouth daily., Disp: 90 tablet, Rfl: 3 .  prazosin (MINIPRESS) 5  MG capsule, Take 1 capsule (5 mg total) by mouth at bedtime., Disp: 30 capsule, Rfl: 2 .  temazepam (RESTORIL) 15 MG capsule, Take 1 capsule (15 mg total) by mouth at bedtime as needed for sleep., Disp: 30 capsule, Rfl: 2 .  triamcinolone cream (KENALOG) 0.1 %, APPLY 1 APPLICATION TOPICALLY TWICE DAILY UNTIL RASH CLEARS (7 TO 10 DAYS), Disp: 30 g, Rfl: 0 .  vitamin C (ASCORBIC ACID) 500 MG tablet, Take 500 mg by mouth daily., Disp: , Rfl:   Assessment/ Plan: 60 y.o. female   Tobacco use disorder - Plan: varenicline (CHANTIX STARTING MONTH PAK) 0.5 MG X 11 & 1 MG X 42 tablet, varenicline (CHANTIX CONTINUING MONTH PAK) 1 MG tablet  Need for Tdap vaccination - Plan: Tdap ([place]) 5-2.5-18.5 LF-MCG/0.5 injection  I have counseled her on Chantix use.  She will follow-up with me if she needs more refills  Tdap shot sent to pharmacy for administration.  Start time: 3:13pm End time: 3:23pm  Total time spent on patient care (including telephone call/ virtual visit): 10 minutes  [place], [place] [phone]

## 2020-09-16 ENCOUNTER — Encounter (HOSPITAL_COMMUNITY): Payer: Self-pay | Admitting: Psychiatry

## 2020-09-16 ENCOUNTER — Telehealth (INDEPENDENT_AMBULATORY_CARE_PROVIDER_SITE_OTHER): Payer: Medicare Other | Admitting: Psychiatry

## 2020-09-16 ENCOUNTER — Other Ambulatory Visit: Payer: Self-pay

## 2020-09-16 DIAGNOSIS — F332 Major depressive disorder, recurrent severe without psychotic features: Secondary | ICD-10-CM

## 2020-09-16 MED ORDER — DIAZEPAM 10 MG PO TABS: 10.0000 mg | ORAL_TABLET | Freq: Three times a day (TID) | ORAL | 2 refills | Status: DC

## 2020-09-16 MED ORDER — DULOXETINE HCL 60 MG PO CPEP: 60.0000 mg | ORAL_CAPSULE | Freq: Two times a day (BID) | ORAL | 2 refills | Status: DC

## 2020-09-16 MED ORDER — TEMAZEPAM 30 MG PO CAPS: 30.0000 mg | ORAL_CAPSULE | Freq: Every evening | ORAL | 2 refills | Status: DC | PRN

## 2020-09-16 MED ORDER — PRAZOSIN HCL 5 MG PO CAPS: 5.0000 mg | ORAL_CAPSULE | Freq: Every day | ORAL | 2 refills | Status: DC

## 2020-09-16 MED ORDER — ARIPIPRAZOLE 5 MG PO TABS: 7.5000 mg | ORAL_TABLET | Freq: Every day | ORAL | 2 refills | Status: DC

## 2020-09-16 NOTE — Progress Notes (Signed)
Virtual Visit via Telephone Note  I connected with [name] on 09/16/20 at 10:40 AM EDT by telephone and verified that I am speaking with the correct person using two identifiers.  Location: Patient: home Provider:home office   I discussed the limitations, risks, security and privacy concerns of performing an evaluation and management service by telephone and the availability of in person appointments. I also discussed with the patient that there may be a patient responsible charge related to this service. The patient expressed understanding and agreed to proceed.    I discussed the assessment and treatment plan with the patient. The patient was provided an opportunity to ask questions and all were answered. The patient agreed with the plan and demonstrated an understanding of the instructions.   The patient was advised to call back or seek an in-person evaluation if the symptoms worsen or if the condition fails to improve as anticipated.  I provided 15 minutes of non-face-to-face time during this encounter.   [name], MD  [facility] MD/PA/NP OP Progress Note  09/16/2020 10:58 AM [name]  MRN:  [id number]  Chief Complaint:  Chief Complaint    Depression; Anxiety; Follow-up     HPI: this patientis a 60 year old divorced white female who liveswith her boyfriendin [place]. She has one son . She had a daughter who died at age 49 in 2011-08-18 of a narcotic overdose. The patient is on disability  Patient returns for follow-up after 2 months regarding depression and anxiety.  She states that she is still up-and-down and mood.  She is not sleeping well and continues to have some nightmares and often wakes up through the night.  She complains of hot flashes.  She is not had a menstrual cycle for 14 years.  She states that her periods stopped when she was receiving chemotherapy and radiation therapy for breast cancer.  The patient states that she is not happy in her current  relationship.  She claims she could not talk much because her boyfriend was listening but that she was going to try to move out very shortly.  She felt like she was being ignored by him and the relationship is very one-sided.  She is about to start therapy in our office.  She does not want to change her medications too much.  She thinks that the change in living situation and relationship as well as the therapy will help.  However because she is not sleeping well we will increase the temazepam.  I also encouraged her to get exercise every day.  She states that at times she has suicidal ideation but claims she would never act on it Visit Diagnosis:    ICD-10-CM   1. Major depressive disorder, recurrent, severe without psychotic features ([place])  F33.2     Past Psychiatric History: Past outpatient treatment  Past Medical History:  Past Medical History:  Diagnosis Date  . Allergy   . Anxiety   . Blood transfusion without reported diagnosis   . Breast cancer ([place])   . Breast cancer ([place]) 04/21/2012   Stage II (T1c N1 M0) grade 3 triple negative breast cancer, right- sided with 1 of 5 positive nodes, status post FEC in a dose dense fashion for 6 cycles followed by radiation therapy by Dr. Sondra Come for her high-risk disease with her initial date of surgery in February 2008.   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 COPD (chronic obstructive pulmonary disease) ([place])   . Depression   . Emphysema  of lung ([place])   . GERD (gastroesophageal reflux disease) 12/18/2012  . Headache   . Hyperlipidemia   . Neuropathy   . Osteoporosis   . PONV (postoperative nausea and vomiting)   . PTSD (post-traumatic stress disorder)   . Substance abuse [facility])     Past Surgical History:  Procedure Laterality Date  . BIOPSY  11/05/2017   Procedure: BIOPSY;  Surgeon: Danie Binder, MD;  Location: [facility] END[facility] SUITE;  Service: Endoscopy;;  colon duodenum gastric  . BREAST SURGERY    . CATARACT EXTRACTION W/PHACO  Left 08/15/2017   Procedure: CATARACT EXTRACTION WITH PHACOEMULSIFICATION  AND INTRAOCULAR LENS PLACEMENT LEFT EYE CDE=2.68;  Surgeon: Baruch Goldmann, MD;  Location: [facility] ORS;  Service: Ophthalmology;  Laterality: Left;  left  . CATARACT EXTRACTION W/PHACO Right 09/06/2017   Procedure: CATARACT EXTRACTION PHACO AND INTRAOCULAR LENS PLACEMENT (IOC);  Surgeon: Baruch Goldmann, MD;  Location: [facility] ORS;  Service: Ophthalmology;  Laterality: Right;  CDE: 1.61  . COLONOSCOPY WITH PROPOFOL N/A 11/05/2017    6 mm polyp in the sigmoid colon which was sessile and removed.  Rectosigmoid colon and sigmoid colon biopsies taken for evaluation for microscopic colitis.  Intermittent rectal bleeding due to sigmoid colon polyp and internal hemorrhoids. Normal colonic biopsies. Tubular adenoma. Repeat surveillance 5-10 years.   . ESOPHAGOGASTRODUODENOSCOPY  03/10/2008   SLF: Normal esophagus without evidence of Barrett, mass, erosion  ulceration, or stricture, small bowel bx negative, gastritis with NO h.pylori  . ESOPHAGOGASTRODUODENOSCOPY (EGD) WITH PROPOFOL N/A 11/05/2017   Mild gastritis and duodenitis.   [name] leocolonoscopy  03/10/2008   HBZ:JIRCVE terminal ileum, approximately 10 cm visualized/Normal colon without evidence of polyps, mass, inflammatory changes, diverticula, or AVMs/Normal retroflexed view of the rectum. random colon bx negative.  [name] MASTECTOMY     bilateral, status post reconstruction.  [name] POLYPECTOMY  11/05/2017   Procedure: POLYPECTOMY;  Surgeon: Danie Binder, MD;  Location: [facility] SU[facility];  Service: Endoscopy;;  colon  . TUBAL LIGATION      Family Psychiatric History: See below  Family History:  Family History  Problem Relation Age of Onset  . COPD Mother   . Depression Mother   . Heart disease Mother   . Hyperlipidemia Mother   . Hypertension Mother   . Cancer Mother        lung cancer, to bone  . Cancer Father        skin  . Heart disease Father   . Hypertension Father   . Hyperlipidemia  Father   . Learning disabilities Father   . Drug abuse Brother        overdose at 48  . Drug abuse Daughter        drug overdose 39  . Cancer Maternal Uncle        lung cancer  . Colon cancer Neg Hx     Social History:  Social History   Socioeconomic History  . Marital status: Divorced    Spouse name: Not on file  . Number of children: 2  . Years of education: 10  . Highest education level: Not on file  Occupational History  . Occupation: disabled    Comment: neuropathy from chemo  Tobacco Use  . Smoking status: Current Every Day Smoker    Packs/day: 0.50    Years: 20.00    Pack years: 10.00    Types: Cigarettes    Start date: 05/07/1992  . Smokeless tobacco: Never Used  . Tobacco comment: discussed  Substance  and Sexual Activity  . Alcohol use: Yes    Alcohol/week: 6.0 standard drinks    Types: 6 Shots of liquor per week    Comment: occ  . Drug use: No  . Sexual activity: Yes    Birth control/protection: Post-menopausal  Other Topics Concern  . Not on file  Social History Narrative   Divorced   Lives alone   Spends time with boyfriend - he is a smoker   Scientist, physiological Strain: Not on file  Food Insecurity: Not on file  Transportation Needs: Not on file  Physical Activity: Not on file  Stress: Not on file  Social Connections: Not on file    Allergies:  Allergies  Allergen Reactions  . Vicodin [Hydrocodone-Acetaminophen] Nausea Only    Metabolic Disorder Labs: No results found for: HGBA1C, MPG No results found for: PROLACTIN Lab Results  Component Value Date   CHOL 172 12/18/2019   TRIG 110 12/18/2019   HDL 53 12/18/2019   CHOLHDL 3.2 12/18/2019   VLDL 42 (H) 11/23/2016   LDLCALC 99 12/18/2019   LDLCALC 103 (H) 07/08/2017   Lab Results  Component Value Date   TSH 2.620 12/18/2019   TSH 2.26 11/29/2015    Therapeutic Level Labs: No results found for: LITHIUM No results found for: VALPROATE No  components found for:  CBMZ  Current Medications: Current Outpatient Medications  Medication Sig Dispense Refill  . temazepam (RESTORIL) 30 MG capsule Take 1 capsule (30 mg total) by mouth at bedtime as needed for sleep. 30 capsule 2  . albuterol (VENTOLIN HFA) 108 (90 Base) MCG/ACT inhaler Inhale 2 puffs into the lungs every 6 (six) hours as needed for wheezing or shortness of breath. 6.7 g 3  . ARIPiprazole (ABILIFY) 5 MG tablet Take 1.5 tablets (7.5 mg total) by mouth daily. 45 tablet 2  . Chlorpheniramine-DM (COUGH & COLD HBP PO) Take by mouth as needed.    . cholecalciferol (VITAMIN D) 1000 units tablet Take 1,000 Units by mouth daily.    . diazepam (VALIUM) 10 MG tablet Take 1 tablet (10 mg total) by mouth 3 (three) times daily. 90 tablet 2  . dicyclomine (BENTYL) 10 MG capsule Take 1 capsule by mouth twice daily 180 capsule 0  . DULoxetine (CYMBALTA) 60 MG capsule Take 1 capsule (60 mg total) by mouth 2 (two) times daily. 180 capsule 2  . fexofenadine (ALLEGRA) 180 MG tablet Take 1 tablet (180 mg total) by mouth daily as needed for allergies or rhinitis. 90 tablet 3  . fluticasone (FLONASE) 50 MCG/ACT nasal spray Place 1 spray into both nostrils 2 (two) times daily as needed for allergies or rhinitis. 16 g 6  . fluticasone (FLOVENT HFA) 220 MCG/ACT inhaler Inhale 2 puffs into the lungs 2 (two) times daily. 1 each 12  . Loperamide HCl (ANTI-DIARRHEAL PO) Take by mouth as needed.    . Multiple Vitamin (MULTIVITAMIN) capsule Take 1 capsule by mouth daily.    [name] omeprazole (PRILOSEC) 40 MG capsule Take 1 capsule (40 mg total) by mouth daily. 90 capsule 3  . pravastatin (PRAVACHOL) 20 MG tablet Take 1 tablet (20 mg total) by mouth daily. 90 tablet 3  . prazosin (MINIPRESS) 5 MG capsule Take 1 capsule (5 mg total) by mouth at bedtime. 30 capsule 2  . triamcinolone cream (KENALOG) 0.1 % APPLY 1 APPLICATION TOPICALLY TWICE DAILY UNTIL RASH CLEARS (7 TO 10 DAYS) 30 g 0  . varenicline (CHANTIX  CONTINUING  MONTH PAK) 1 MG tablet Take 1 tablet (1 mg total) by mouth 2 (two) times daily. 60 tablet 2  . varenicline (CHANTIX STARTING MONTH PAK) 0.5 MG X 11 & 1 MG X 42 tablet Take 0.5 mg tablet by mouth once daily x3 days, then 0.5 mg tablet twice daily x4 days, then increase to one 1 mg tablet twice daily. 53 tablet 0  . vitamin C (ASCORBIC ACID) 500 MG tablet Take 500 mg by mouth daily.     No current facility-administered medications for this visit.     Musculoskeletal: Strength & Muscle Tone: within normal limits Gait & Station: normal Patient leans: N/A  Psychiatric Specialty Exam: Review of Systems  Psychiatric/Behavioral: Positive for dysphoric mood and sleep disturbance.  All other systems reviewed and are negative.   Last menstrual period 07/09/2013.There is no height or weight on file to calculate BMI.  General Appearance: NA  Eye Contact:  NA  Speech:  Clear and Coherent  Volume:  Normal  Mood:  Depressed  Affect:  NA  Thought Process:  Goal Directed  Orientation:  Full (Time, Place, and Person)  Thought Content: Rumination   Suicidal Thoughts:  Yes.  without intent/plan  Homicidal Thoughts:  No  Memory:  Immediate;   Good Recent;   Good Remote;   Good  Judgement:  Good  Insight:  Fair  Psychomotor Activity:  Normal  Concentration:  Concentration: Good and Attention Span: Good  Recall:  Good  Fund of Knowledge: Good  Language: Good  Akathisia:  No  Handed:  Right  AIMS (if indicated): not done  Assets:  Communication Skills Desire for Improvement Physical Health Resilience Social Support Talents/Skills  ADL's:  Intact  Cognition: WNL  Sleep:  Poor   Screenings: PHQ2-9   Flowsheet Row Video Visit from 09/16/2020 in [facility] Video Visit from 07/20/2020 in [place] Office Visit from 12/21/2019 in [place] Office Visit from 03/14/2018 in  [place] Office Visit from 09/02/2017 in [place]  PHQ-2 Total Score 2 2 3 4 4   PHQ-9 Total Score 12 9 13 18 9     Flowsheet Row Video Visit from 09/16/2020 in [place] [facility] Video Visit from 07/20/2020 in [place] Error: Q7 should not be populated when Q6 is No No Risk       Assessment and Plan: This patient is a 60 year old female with a history of depression anxiety and PTSD.  She has agreed to start counseling which I think is very important.  She is also going to make changes in her relationship status which she thinks will be very helpful. Since she is not sleeping well I will increase Restoril to 30 mg at bedtime along with prazosin 5 mg at bedtime to prevent nightmares.  She will continue Cymbalta 60 mg twice daily for depression Abilify 7.5 mg daily for augmentation and Valium 10 mg 3 times daily for anxiety.  She assured me that she is not going to do anything to harm herself and if she has these feelings she will call 911 or go to the behavioral health urgent care center.  She will return to see me in 6 weeks  [name], MD 09/16/2020, 10:58 AM

## 2020-09-27 ENCOUNTER — Ambulatory Visit (INDEPENDENT_AMBULATORY_CARE_PROVIDER_SITE_OTHER): Payer: Medicare Other | Admitting: Clinical

## 2020-09-27 ENCOUNTER — Other Ambulatory Visit: Payer: Self-pay

## 2020-09-27 DIAGNOSIS — F4310 Post-traumatic stress disorder, unspecified: Secondary | ICD-10-CM

## 2020-09-27 DIAGNOSIS — F411 Generalized anxiety disorder: Secondary | ICD-10-CM | POA: Diagnosis not present

## 2020-09-27 DIAGNOSIS — F332 Major depressive disorder, recurrent severe without psychotic features: Secondary | ICD-10-CM
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
## 2020-10-27 DIAGNOSIS — H524 Presbyopia: Secondary | ICD-10-CM | POA: Diagnosis not present

## 2021-01-17 ENCOUNTER — Other Ambulatory Visit: Payer: Self-pay | Admitting: Family Medicine

## 2021-01-17 ENCOUNTER — Encounter: Payer: Self-pay | Admitting: Family Medicine

## 2021-01-17 ENCOUNTER — Other Ambulatory Visit: Payer: Self-pay

## 2021-01-17 ENCOUNTER — Ambulatory Visit (INDEPENDENT_AMBULATORY_CARE_PROVIDER_SITE_OTHER): Payer: Medicare Other | Admitting: Family Medicine

## 2021-01-17 VITALS — BP 125/78 | HR 101 | Temp 98.0°F | Ht 64.0 in | Wt 174.4 lb

## 2021-01-17 DIAGNOSIS — K529 Noninfective gastroenteritis and colitis, unspecified: Secondary | ICD-10-CM | POA: Diagnosis not present

## 2021-01-17 DIAGNOSIS — L309 Dermatitis, unspecified: Secondary | ICD-10-CM | POA: Diagnosis not present

## 2021-01-17 DIAGNOSIS — F17200 Nicotine dependence, unspecified, uncomplicated: Secondary | ICD-10-CM

## 2021-01-17 DIAGNOSIS — R6889 Other general symptoms and signs: Secondary | ICD-10-CM | POA: Diagnosis not present

## 2021-01-17 MED ORDER — VARENICLINE TARTRATE 0.5 MG X 11 & 1 MG X 42 PO MISC: ORAL | 0 refills | Status: DC

## 2021-01-17 MED ORDER — EUCRISA 2 % EX OINT: TOPICAL_OINTMENT | CUTANEOUS | 99 refills | Status: DC

## 2021-01-17 MED ORDER — VARENICLINE TARTRATE 1 MG PO TABS: 1.0000 mg | ORAL_TABLET | Freq: Two times a day (BID) | ORAL | 2 refills | Status: DC

## 2021-01-17 NOTE — Progress Notes (Signed)
Subjective: QI:[id number] PCP: [name], [name] GLO:VFIEPP I [name] is a 60 y.o. female presenting to clinic today for:  1. Diarrhea Patient has suffered from chronic diarrhea for at least 2 years now.  She was last seen by me for this in 2020.  She was seen by GI in September 2020.  It was thought that she perhaps had an irritable bowel syndrome and they placed her on Bentyl.  She notes that Bentyl does not really help.  She has not sought reevaluation with them.  She does not report any rectal bleeding but does note that sometimes she has associated nausea and vomiting.  She is compliant with her Prilosec and feels that her GERD is well controlled.  Denies any exposures.  Symptoms have been fairly persistent this time for the last 3 weeks.  Sometimes when she coughs she will have fecal incontinence.  Denies any changes to medications.  She is seen by psychiatry and has had no lapse in her meds.  She sometimes engages in marijuana use to settle her stomach  2.  Eczema Patient reports that she is struggled with eczema on her face previously.  She was on topical corticosteroids but she has recurrence.  Used to see dermatology but has not seen them in a while  3.  Tobacco use disorder Never started the Chantix due to the recall.  She denies any hemoptysis.  She has chronic wheezing.   ROS: Per HPI  Allergies  Allergen Reactions   Vicodin [Hydrocodone-Acetaminophen] Nausea Only   Past Medical History:  Diagnosis Date   Allergy    Anxiety    Blood transfusion without reported diagnosis    Breast cancer ([place])    Breast cancer ([place]) 04/21/2012   Stage II (T1c N1 M0) grade 3 triple negative breast cancer, right- sided with 1 of 5 positive nodes, status post FEC in a dose dense fashion for 6 cycles followed by radiation therapy by Dr. Sondra Come for her high-risk disease with her initial date of surgery in February 2008.    Chemotherapy-induced neuropathy ([place]) 06/21/2016   COPD (chronic  obstructive pulmonary disease) (HCC)    Depression    Emphysema of lung (HCC)    GERD (gastroesophageal reflux disease) 12/18/2012   Headache    Hyperlipidemia    Neuropathy    Osteoporosis    PONV (postoperative nausea and vomiting)    PTSD (post-traumatic stress disorder)    Substance abuse ([place])     Current Outpatient Medications:    albuterol (VENTOLIN HFA) 108 (90 Base) MCG/ACT inhaler, Inhale 2 puffs into the lungs every 6 (six) hours as needed for wheezing or shortness of breath., Disp: 6.7 g, Rfl: 3   ARIPiprazole (ABILIFY) 5 MG tablet, Take 1.5 tablets (7.5 mg total) by mouth daily., Disp: 45 tablet, Rfl: 2   Chlorpheniramine-DM (COUGH & COLD HBP PO), Take by mouth as needed., Disp: , Rfl:    cholecalciferol (VITAMIN D) 1000 units tablet, Take 1,000 Units by mouth daily., Disp: , Rfl:    Crisaborole (EUCRISA) 2 % OINT, Apply once daily to face for eczema (has failed topical corticosteroids x3), Disp: 60 g, Rfl: PRN   diazepam (VALIUM) 10 MG tablet, Take 1 tablet (10 mg total) by mouth 3 (three) times daily., Disp: 90 tablet, Rfl: 2   dicyclomine (BENTYL) 10 MG capsule, Take 1 capsule by mouth twice daily, Disp: 180 capsule, Rfl: 0   DULoxetine (CYMBALTA) 60 MG capsule, Take 1 capsule (60 mg total) by mouth  2 (two) times daily., Disp: 180 capsule, Rfl: 2   fexofenadine (ALLEGRA) 180 MG tablet, Take 1 tablet (180 mg total) by mouth daily as needed for allergies or rhinitis., Disp: 90 tablet, Rfl: 3   fluticasone (FLONASE) 50 MCG/ACT nasal spray, Place 1 spray into both nostrils 2 (two) times daily as needed for allergies or rhinitis., Disp: 16 g, Rfl: 6   fluticasone (FLOVENT HFA) 220 MCG/ACT inhaler, Inhale 2 puffs into the lungs 2 (two) times daily., Disp: 1 each, Rfl: 12   Loperamide HCl (ANTI-DIARRHEAL PO), Take by mouth as needed., Disp: , Rfl:    Multiple Vitamin (MULTIVITAMIN) capsule, Take 1 capsule by mouth daily., Disp: , Rfl:    omeprazole (PRILOSEC) 40 MG capsule, Take 1  capsule (40 mg total) by mouth daily., Disp: 90 capsule, Rfl: 3   pravastatin (PRAVACHOL) 20 MG tablet, Take 1 tablet (20 mg total) by mouth daily., Disp: 90 tablet, Rfl: 3   prazosin (MINIPRESS) 5 MG capsule, Take 1 capsule (5 mg total) by mouth at bedtime., Disp: 30 capsule, Rfl: 2   temazepam (RESTORIL) 30 MG capsule, Take 1 capsule (30 mg total) by mouth at bedtime as needed for sleep., Disp: 30 capsule, Rfl: 2   triamcinolone cream (KENALOG) 0.1 %, APPLY 1 APPLICATION TOPICALLY TWICE DAILY UNTIL RASH CLEARS (7 TO 10 DAYS), Disp: 30 g, Rfl: 0   varenicline (CHANTIX CONTINUING MONTH PAK) 1 MG tablet, Take 1 tablet (1 mg total) by mouth 2 (two) times daily., Disp: 60 tablet, Rfl: 2   varenicline (CHANTIX STARTING MONTH PAK) 0.5 MG X 11 & 1 MG X 42 tablet, Take 0.5 mg tablet by mouth once daily x3 days, then 0.5 mg tablet twice daily x4 days, then increase to one 1 mg tablet twice daily., Disp: 53 tablet, Rfl: 0   vitamin C (ASCORBIC ACID) 500 MG tablet, Take 500 mg by mouth daily., Disp: , Rfl:  Social History   Socioeconomic History   Marital status: Divorced    Spouse name: Not on file   Number of children: 2   Years of education: 13   Highest education level: Not on file  Occupational History   Occupation: disabled    Comment: neuropathy from chemo  Tobacco Use   Smoking status: Every Day    Packs/day: 0.50    Years: 20.00    Pack years: 10.00    Types: Cigarettes    Start date: 05/07/1992   Smokeless tobacco: Never   Tobacco comments:    discussed  Substance and Sexual Activity   Alcohol use: Yes    Alcohol/week: 6.0 standard drinks    Types: 6 Shots of liquor per week    Comment: occ   Drug use: No   Sexual activity: Yes    Birth control/protection: Post-menopausal  Other Topics Concern   Not on file  Social History Narrative   Divorced   Lives alone   Spends time with boyfriend - he is a smoker   Investment banker, operational of Radio broadcast assistant Strain: Not on  file  Food Insecurity: Not on file  Transportation Needs: Not on file  Physical Activity: Not on file  Stress: Not on file  Social Connections: Not on file  Intimate Partner Violence: Not on file   Family History  Problem Relation Age of Onset   COPD Mother    Depression Mother    Heart disease Mother    Hyperlipidemia Mother    Hypertension Mother  Cancer Mother        lung cancer, to bone   Cancer Father        skin   Heart disease Father    Hypertension Father    Hyperlipidemia Father    Learning disabilities Father    Drug abuse Brother        overdose at 47   Drug abuse Daughter        drug overdose 9   Cancer Maternal Uncle        lung cancer   Colon cancer Neg Hx     Objective: Office vital signs reviewed. BP 125/78   Pulse (!) 101   Temp 98 F (36.7 C)   Ht $R'5\' 4"'RC$  (1.626 m)   Wt 174 lb 6.4 oz (79.1 kg)   LMP 07/09/2013   SpO2 95%   BMI 29.94 kg/m   Physical Examination:  General: Awake, alert, chronically ill-appearing female, No acute distress HEENT: No exophthalmos.  Mild fullness of the thyroid. Cardio: regular rate and rhythm, S1S2 heard, no murmurs appreciated Pulm: clear to auscultation bilaterally, no wheezes, rhonchi or rales; normal work of breathing on room air Psych: Very nervous appearing Neuro: No tremor   Assessment/ Plan: 60 y.o. female   Chronic diarrhea - Plan: Cdiff NAA+O+P+Stool Culture, Fecal fat, qualitative, TSH, T4, Free, CMP14+EGFR, CBC  Eczema of face - Plan: Crisaborole (EUCRISA) 2 % OINT  Tobacco use disorder - Plan: varenicline (CHANTIX STARTING MONTH PAK) 0.5 MG X 11 & 1 MG X 42 tablet, varenicline (CHANTIX CONTINUING MONTH PAK) 1 MG tablet  Has been an issue apparently for years.  She last saw GI back in 2020.  Symptoms are apparently refractory to Bentyl twice daily.  She has not followed up with them since.  I am going to run some stool studies on her as well as blood labs to further evaluate.  Could trial  Xifaxan.  Encouraged to use over-the-counter probiotics in the meantime.  We will await stool studies before prescribing to ensure that she does not have an infectious etiology.  May need to follow-up again with GI if nonresponsive.  Eczema on face.  Has been treated with corticosteroids previously.  Trial of Eucrisa.  Keep follow-up with dermatology if does not help  Chantix renewed.  Apparently never started due to it being on backorder at 1 point.  Reinforce smoking cessation.  Patient is action phase  Orders Placed This Encounter  Procedures   Cdiff NAA+O+P+Stool Culture   Fecal fat, qualitative   TSH   T4, Free   CMP14+EGFR   CBC   Meds ordered this encounter  Medications   Crisaborole (EUCRISA) 2 % OINT    Sig: Apply once daily to face for eczema (has failed topical corticosteroids x3)    Dispense:  60 g    Refill:  PRN     [name], [name] [place] [phone]

## 2021-01-17 NOTE — Telephone Encounter (Signed)
CANNOT USE ON FACE.  It is contraindicated to use high potency on face.

## 2021-01-17 NOTE — Telephone Encounter (Signed)
Left message to call back  

## 2021-01-17 NOTE — Telephone Encounter (Signed)
If not covered, needs to see Derm for further recs.

## 2021-01-17 NOTE — Patient Instructions (Signed)
You had labs performed today.  You will be contacted with the results of the labs once they are available, usually in the next 3 business days for routine lab work.  If you have an active my chart account, they will be released to your MyChart.  If you prefer to have these labs released to you via telephone, please let us know.  We may start a product called Xifaxan for Irritable bowel syndrome if your stool studies do not show infection.  Eucrisa sent for eczema on face.  May use daily.  If you feel like it causes burning, you may refrigerate before use.

## 2021-01-18 ENCOUNTER — Telehealth: Payer: Self-pay | Admitting: Family Medicine

## 2021-01-18 ENCOUNTER — Encounter (HOSPITAL_COMMUNITY): Payer: Self-pay | Admitting: Psychiatry

## 2021-01-18 ENCOUNTER — Telehealth (INDEPENDENT_AMBULATORY_CARE_PROVIDER_SITE_OTHER): Payer: Medicare Other | Admitting: Psychiatry

## 2021-01-18 DIAGNOSIS — F411 Generalized anxiety disorder: Secondary | ICD-10-CM | POA: Diagnosis not present

## 2021-01-18 DIAGNOSIS — F332 Major depressive disorder, recurrent severe without psychotic features: Secondary | ICD-10-CM

## 2021-01-18 DIAGNOSIS — F4310 Post-traumatic stress disorder, unspecified: Secondary | ICD-10-CM | POA: Diagnosis not present

## 2021-01-18 LAB — CMP14+EGFR
ALT: 21 IU/L (ref 0–32)
AST: 15 IU/L (ref 0–40)
Albumin/Globulin Ratio: 2 (ref 1.2–2.2)
Albumin: 4.7 g/dL (ref 3.8–4.9)
Alkaline Phosphatase: 81 IU/L (ref 44–121)
BUN/Creatinine Ratio: 13 (ref 9–23)
BUN: 8 mg/dL (ref 6–24)
Bilirubin Total: 0.3 mg/dL (ref 0.0–1.2)
CO2: 23 mmol/L (ref 20–29)
Calcium: 9.9 mg/dL (ref 8.7–10.2)
Chloride: 103 mmol/L (ref 96–106)
Creatinine, Ser: 0.62 mg/dL (ref 0.57–1.00)
Globulin, Total: 2.4 g/dL (ref 1.5–4.5)
Glucose: 98 mg/dL (ref 65–99)
Potassium: 4.4 mmol/L (ref 3.5–5.2)
Sodium: 141 mmol/L (ref 134–144)
Total Protein: 7.1 g/dL (ref 6.0–8.5)
eGFR: 103 mL/min/{1.73_m2} (ref >59)

## 2021-01-18 LAB — CBC
Hematocrit: 45.6 % (ref 34.0–46.6)
Hemoglobin: 15.5 g/dL (ref 11.1–15.9)
MCH: 30.9 pg (ref 26.6–33.0)
MCHC: 34 g/dL (ref 31.5–35.7)
MCV: 91 fL (ref 79–97)
Platelets: 347 10*3/uL (ref 150–450)
RBC: 5.02 x10E6/uL (ref 3.77–5.28)
RDW: 12.3 % (ref 11.7–15.4)
WBC: 8.2 10*3/uL (ref 3.4–10.8)

## 2021-01-18 LAB — TSH: TSH: 3.11 u[IU]/mL (ref 0.450–4.500)

## 2021-01-18 LAB — T4, FREE: Free T4: 1.13 ng/dL (ref 0.82–1.77)

## 2021-01-18 MED ORDER — DIAZEPAM 10 MG PO TABS: 10.0000 mg | ORAL_TABLET | Freq: Three times a day (TID) | ORAL | 2 refills | Status: DC

## 2021-01-18 MED ORDER — ARIPIPRAZOLE 5 MG PO TABS: 7.5000 mg | ORAL_TABLET | Freq: Every day | ORAL | 2 refills | Status: DC

## 2021-01-18 MED ORDER — DULOXETINE HCL 60 MG PO CPEP: 60.0000 mg | ORAL_CAPSULE | Freq: Two times a day (BID) | ORAL | 2 refills | Status: DC

## 2021-01-18 MED ORDER — TEMAZEPAM 30 MG PO CAPS: 30.0000 mg | ORAL_CAPSULE | Freq: Every evening | ORAL | 2 refills | Status: DC | PRN

## 2021-01-18 MED ORDER — PRAZOSIN HCL 5 MG PO CAPS: 5.0000 mg | ORAL_CAPSULE | Freq: Every day | ORAL | 2 refills | Status: DC

## 2021-01-18 NOTE — Progress Notes (Signed)
Virtual Visit via Telephone Note  I connected with [name] on 01/18/21 at 10:40 AM EDT by telephone and verified that I am speaking with the correct person using two identifiers.  Location: Patient: home Provider: home office   I discussed the limitations, risks, security and privacy concerns of performing an evaluation and management service by telephone and the availability of in person appointments. I also discussed with the patient that there may be a patient responsible charge related to this service. The patient expressed understanding and agreed to proceed.      I discussed the assessment and treatment plan with the patient. The patient was provided an opportunity to ask questions and all were answered. The patient agreed with the plan and demonstrated an understanding of the instructions.   The patient was advised to call back or seek an in-person evaluation if the symptoms worsen or if the condition fails to improve as anticipated.  I provided  minutes of non-face-to-face time during this encounter.   Judy Spiller, MD  [facility] MD/PA/NP OP Progress Note  01/18/2021 11:01 AM [name]  MRN:  [id number]  Chief Complaint:  Chief Complaint   Anxiety; Depression; Follow-up    HPI: This patient is a 60 year old divorced white female who lives with her boyfriend in [place].  She has 1 grown son.  She has a daughter who died in August 02, 2011 of a narcotic overdose.  The patient is on disability.  The patient returns for follow-up after 3 months regarding her depression and anxiety.  Right now she is actually doing fairly well.  She is trying to help her son gain at least partial custody of his baby.  This seems to be taking up most of her time.  She claims that she "does not have time to be depressed.". She is sleeping fairly well but still has some nightmares.  The praises and the temazepam helped.  She feels that her anxiety is under fairly good control.  She is having chronic  diarrhea and just saw her primary doctor yesterday and may be starting a new medication.  She denies any thoughts of self-harm or suicidal ideation.  She actually sounds fairly upbeat today Visit Diagnosis:    ICD-10-CM   1. Major depressive disorder, recurrent, severe without psychotic features ([place])  F33.2     2. Generalized anxiety disorder  F41.1     3. PTSD (post-traumatic stress disorder)  F43.10       Past Psychiatric History: Past outpatient treatment  Past Medical History:  Past Medical History:  Diagnosis Date   Allergy    Anxiety    Blood transfusion without reported diagnosis    Breast cancer ([place])    Breast cancer ([place]) 04/21/2012   Stage II (T1c N1 M0) grade 3 triple negative breast cancer, right- sided with 1 of 5 positive nodes, status post FEC in a dose dense fashion for 6 cycles followed by radiation therapy by Dr. Sondra Come for her high-risk disease with her initial date of surgery in February 2008.    Chemotherapy-induced neuropathy ([place]) 06/21/2016   COPD (chronic obstructive pulmonary disease) (HCC)    Depression    Emphysema of lung (HCC)    GERD (gastroesophageal reflux disease) 12/18/2012   Headache    Hyperlipidemia    Neuropathy    Osteoporosis    PONV (postoperative nausea and vomiting)    PTSD (post-traumatic stress disorder)    Substance abuse [facility])     Past Surgical History:  Procedure  Laterality Date   BIOPSY  11/05/2017   Procedure: BIOPSY;  Surgeon: Danie Binder, MD;  Location: [facility];  Service: Endoscopy;;  colon duodenum gastric   BREAST SURGERY     CATARACT EXTRACTION W/PHACO Left 08/15/2017   Procedure: CATARACT EXTRACTION WITH PHACOEMULSIFICATION  AND INTRAOCULAR LENS PLACEMENT LEFT EYE CDE=2.68;  Surgeon: Baruch Goldmann, MD;  Location: [facility] ORS;  Service: Ophthalmology;  Laterality: Left;  left   CATARACT EXTRACTION W/PHACO Right 09/06/2017   Procedure: CATARACT EXTRACTION PHACO AND INTRAOCULAR LENS PLACEMENT (IOC);  Surgeon: Baruch Goldmann, MD;  Location: [facility] ORS;  Service: Ophthalmology;  Laterality: Right;  CDE: 1.61   COLONOSCOPY WITH PROPOFOL N/A 11/05/2017    6 mm polyp in the sigmoid colon which was sessile and removed.  Rectosigmoid colon and sigmoid colon biopsies taken for evaluation for microscopic colitis.  Intermittent rectal bleeding due to sigmoid colon polyp and internal hemorrhoids. Normal colonic biopsies. Tubular adenoma. Repeat surveillance 5-10 years.    ESOPHAGOGASTRODUODENOSCOPY  03/10/2008   SLF: Normal esophagus without evidence of Barrett, mass, erosion  ulceration, or stricture, small bowel bx negative, gastritis with NO h.pylori   ESOPHAGOGASTRODUODENOSCOPY (EGD) WITH PROPOFOL N/A 11/05/2017   Mild gastritis and duodenitis.    leocolonoscopy  03/10/2008   [id number] terminal ileum, approximately 10 cm visualized/Normal colon without evidence of polyps, mass, inflammatory changes, diverticula, or AVMs/Normal retroflexed view of the rectum. random colon bx negative.   MASTECTOMY     bilateral, status post reconstruction.   POLYPECTOMY  11/05/2017   Procedure: POLYPECTOMY;  Surgeon: Danie Binder, MD;  Location: [facility];  Service: Endoscopy;;  colon   TUBAL LIGATION      Family Psychiatric History: see below  Family History:  Family History  Problem Relation Age of Onset   COPD Mother    Depression Mother    Heart disease Mother    Hyperlipidemia Mother    Hypertension Mother    Cancer Mother        lung cancer, to bone   Cancer Father        skin   Heart disease Father    Hypertension Father    Hyperlipidemia Father    Learning disabilities Father    Drug abuse Brother        overdose at 27   Drug abuse Daughter        drug overdose 46   Cancer Maternal Uncle        lung cancer   Colon cancer Neg Hx     Social History:  Social History   Socioeconomic History   Marital status: Divorced    Spouse name: Not on file   Number of children: 2   Years of education: 19   Highest  education level: Not on file  Occupational History   Occupation: disabled    Comment: neuropathy from chemo  Tobacco Use   Smoking status: Every Day    Packs/day: 0.50    Years: 20.00    Pack years: 10.00    Types: Cigarettes    Start date: 05/07/1992   Smokeless tobacco: Never   Tobacco comments:    discussed  Substance and Sexual Activity   Alcohol use: Yes    Alcohol/week: 6.0 standard drinks    Types: 6 Shots of liquor per week    Comment: occ   Drug use: No   Sexual activity: Yes    Birth control/protection: Post-menopausal  Other Topics Concern   Not on file  Social History Narrative   Divorced   Lives alone   Spends time with boyfriend - he is a smoker   Scientist, physiological Strain: Not on file  Food Insecurity: Not on file  Transportation Needs: Not on file  Physical Activity: Not on file  Stress: Not on file  Social Connections: Not on file    Allergies:  Allergies  Allergen Reactions   Vicodin [Hydrocodone-Acetaminophen] Nausea Only    Metabolic Disorder Labs: No results found for: HGBA1C, MPG No results found for: PROLACTIN Lab Results  Component Value Date   CHOL 172 12/18/2019   TRIG 110 12/18/2019   HDL 53 12/18/2019   CHOLHDL 3.2 12/18/2019   VLDL 42 (H) 11/23/2016   LDLCALC 99 12/18/2019   LDLCALC 103 (H) 07/08/2017   Lab Results  Component Value Date   TSH 3.110 01/17/2021   TSH 2.620 12/18/2019    Therapeutic Level Labs: No results found for: LITHIUM No results found for: VALPROATE No components found for:  CBMZ  Current Medications: Current Outpatient Medications  Medication Sig Dispense Refill   albuterol (VENTOLIN HFA) 108 (90 Base) MCG/ACT inhaler Inhale 2 puffs into the lungs every 6 (six) hours as needed for wheezing or shortness of breath. 6.7 g 3   ARIPiprazole (ABILIFY) 5 MG tablet Take 1.5 tablets (7.5 mg total) by mouth daily. 45 tablet 2   Chlorpheniramine-DM (COUGH & COLD HBP PO) Take  by mouth as needed.     cholecalciferol (VITAMIN D) 1000 units tablet Take 1,000 Units by mouth daily.     Crisaborole (EUCRISA) 2 % OINT Apply once daily to face for eczema (has failed topical corticosteroids x3) 60 g PRN   diazepam (VALIUM) 10 MG tablet Take 1 tablet (10 mg total) by mouth 3 (three) times daily. 90 tablet 2   dicyclomine (BENTYL) 10 MG capsule Take 1 capsule by mouth twice daily 180 capsule 0   DULoxetine (CYMBALTA) 60 MG capsule Take 1 capsule (60 mg total) by mouth 2 (two) times daily. 180 capsule 2   fexofenadine (ALLEGRA) 180 MG tablet Take 1 tablet (180 mg total) by mouth daily as needed for allergies or rhinitis. 90 tablet 3   fluticasone (FLONASE) 50 MCG/ACT nasal spray Place 1 spray into both nostrils 2 (two) times daily as needed for allergies or rhinitis. 16 g 6   fluticasone (FLOVENT HFA) 220 MCG/ACT inhaler Inhale 2 puffs into the lungs 2 (two) times daily. 1 each 12   Loperamide HCl (ANTI-DIARRHEAL PO) Take by mouth as needed.     Multiple Vitamin (MULTIVITAMIN) capsule Take 1 capsule by mouth daily.     omeprazole (PRILOSEC) 40 MG capsule Take 1 capsule (40 mg total) by mouth daily. 90 capsule 3   pravastatin (PRAVACHOL) 20 MG tablet Take 1 tablet (20 mg total) by mouth daily. 90 tablet 3   prazosin (MINIPRESS) 5 MG capsule Take 1 capsule (5 mg total) by mouth at bedtime. 30 capsule 2   temazepam (RESTORIL) 30 MG capsule Take 1 capsule (30 mg total) by mouth at bedtime as needed for sleep. 30 capsule 2   triamcinolone cream (KENALOG) 0.1 % APPLY 1 APPLICATION TOPICALLY TWICE DAILY UNTIL RASH CLEARS (7 TO 10 DAYS) 30 g 0   varenicline (CHANTIX CONTINUING MONTH PAK) 1 MG tablet Take 1 tablet (1 mg total) by mouth 2 (two) times daily. 60 tablet 2   varenicline (CHANTIX STARTING MONTH PAK) 0.5 MG X 11 & 1 MG X  42 tablet Take 0.5 mg tablet by mouth once daily x3 days, then 0.5 mg tablet twice daily x4 days, then increase to one 1 mg tablet twice daily. 53 tablet 0    vitamin C (ASCORBIC ACID) 500 MG tablet Take 500 mg by mouth daily.     No current facility-administered medications for this visit.     Musculoskeletal: Strength & Muscle Tone: na Gait & Station: na Patient leans: N/A  Psychiatric Specialty Exam: Review of Systems  Gastrointestinal:  Positive for diarrhea.  All other systems reviewed and are negative.  Last menstrual period 07/09/2013.There is no height or weight on file to calculate BMI.  General Appearance: NA  Eye Contact:  NA  Speech:  Clear and Coherent  Volume:  Normal  Mood:  Euthymic  Affect:  NA  Thought Process:  Goal Directed  Orientation:  Full (Time, Place, and Person)  Thought Content: Rumination   Suicidal Thoughts:  No  Homicidal Thoughts:  No  Memory:  Immediate;   Good Recent;   Good Remote;   Fair  Judgement:  Good  Insight:  Fair  Psychomotor Activity:  Normal  Concentration:  Concentration: Good and Attention Span: Good  Recall:  Good  Fund of Knowledge: Good  Language: Good  Akathisia:  No  Handed:  Right  AIMS (if indicated): not done  Assets:  Communication Skills Desire for Improvement Resilience Social Support Talents/Skills  ADL's:  Intact  Cognition: WNL  Sleep:  Fair   Screenings: GAD-7    Flowsheet Row Office Visit from 01/17/2021 in [place] from 09/27/2020 in [facility]  Total GAD-7 Score 17 19      PHQ2-9    Flowsheet Row Video Visit from 01/18/2021 in [facility] Office Visit from 01/17/2021 in [facility] from 09/27/2020 in [facility] Video Visit from 09/16/2020 in [place] Video Visit from 07/20/2020 in [facility]  PHQ-2 Total Score 0 '5 6 2 2  '$ PHQ-9 Total Score '3 18 20 12 9      '$ Flowsheet  Row Counselor from 09/27/2020 in [facility] Video Visit from 09/16/2020 in [place] Video Visit from 07/20/2020 in [place] Error: Q7 should not be populated when Q6 is No Error: Q7 should not be populated when Q6 is No No Risk        Assessment and Plan: This patient is a 60 year old female with a history of depression anxiety and PTSD.  For the most part she is doing well.  She will continue Restoril 30 mg at bedtime for sleep, prazosin 5 mg at bedtime to prevent nightmares, Cymbalta 60 mg twice daily for depression, Abilify 7.5 mg daily for augmentation and Valium 10 mg 3 times daily for anxiety.  She will return to see me in 3 months   Judy Spiller, MD 01/18/2021, 11:01 AM

## 2021-01-18 NOTE — Telephone Encounter (Signed)
Patient aware that Dr. Lajuana Ripple is waiting on stool culture to return until she starts medication. Patients states she will bring them in tomorrow.

## 2021-01-19 ENCOUNTER — Other Ambulatory Visit: Payer: Self-pay

## 2021-01-19 ENCOUNTER — Other Ambulatory Visit: Payer: Self-pay | Admitting: Family Medicine

## 2021-01-19 ENCOUNTER — Other Ambulatory Visit: Payer: Medicare Other

## 2021-01-19 ENCOUNTER — Telehealth: Payer: Self-pay | Admitting: Family Medicine

## 2021-01-19 DIAGNOSIS — K529 Noninfective gastroenteritis and colitis, unspecified: Secondary | ICD-10-CM | POA: Diagnosis not present

## 2021-01-19 NOTE — Telephone Encounter (Signed)
Detailed message left for patient that she needs to come back by and pick up more stool cultures to complete. Patient has two more tubes that need to be collected.

## 2021-01-19 NOTE — Progress Notes (Signed)
Virtual Visit via Telephone Note  I connected with [name] on 01/19/21 at 10:40 AM EDT by telephone and verified that I am speaking with the correct person using two identifiers.  Location: Patient: home Provider: home office   I discussed the limitations, risks, security and privacy concerns of performing an evaluation and management service by telephone and the availability of in person appointments. I also discussed with the patient that there may be a patient responsible charge related to this service. The patient expressed understanding and agreed to proceed.      I discussed the assessment and treatment plan with the patient. The patient was provided an opportunity to ask questions and all were answered. The patient agreed with the plan and demonstrated an understanding of the instructions.   The patient was advised to call back or seek an in-person evaluation if the symptoms worsen or if the condition fails to improve as anticipated.  I provided 16 minutes of non-face-to-face time during this encounter.   Judy Spiller, MD  [facility] MD/PA/NP OP Progress Note  01/19/2021 3:57 PM [name]  MRN:  [id number]  Chief Complaint:  Chief Complaint   Anxiety; Depression; Follow-up    HPI: This patient is a 60 year old divorced white female who lives with her boyfriend in [place].  She has 1 grown son.  She has a daughter who died in 2011/08/03 of a narcotic overdose.  The patient is on disability.  The patient returns for follow-up after 3 months regarding her depression and anxiety.  Right now she is actually doing fairly well.  She is trying to help her son gain at least partial custody of his baby.  This seems to be taking up most of her time.  She claims that she "does not have time to be depressed.". She is sleeping fairly well but still has some nightmares.  The praises and the temazepam helped.  She feels that her anxiety is under fairly good control.  She is having chronic  diarrhea and just saw her primary doctor yesterday and may be starting a new medication.  She denies any thoughts of self-harm or suicidal ideation.  She actually sounds fairly upbeat today Visit Diagnosis:    ICD-10-CM   1. Major depressive disorder, recurrent, severe without psychotic features ([place])  F33.2     2. Generalized anxiety disorder  F41.1     3. PTSD (post-traumatic stress disorder)  F43.10       Past Psychiatric History: Past outpatient treatment  Past Medical History:  Past Medical History:  Diagnosis Date   Allergy    Anxiety    Blood transfusion without reported diagnosis    Breast cancer ([place])    Breast cancer ([place]) 04/21/2012   Stage II (T1c N1 M0) grade 3 triple negative breast cancer, right- sided with 1 of 5 positive nodes, status post FEC in a dose dense fashion for 6 cycles followed by radiation therapy by Dr. Sondra Come for her high-risk disease with her initial date of surgery in February 2008.    Chemotherapy-induced neuropathy ([place]) 06/21/2016   COPD (chronic obstructive pulmonary disease) (HCC)    Depression    Emphysema of lung (HCC)    GERD (gastroesophageal reflux disease) 12/18/2012   Headache    Hyperlipidemia    Neuropathy    Osteoporosis    PONV (postoperative nausea and vomiting)    PTSD (post-traumatic stress disorder)    Substance abuse [facility])     Past Surgical History:  Procedure  Laterality Date   BIOPSY  11/05/2017   Procedure: BIOPSY;  Surgeon: Danie Binder, MD;  Location: [facility];  Service: Endoscopy;;  colon duodenum gastric   BREAST SURGERY     CATARACT EXTRACTION W/PHACO Left 08/15/2017   Procedure: CATARACT EXTRACTION WITH PHACOEMULSIFICATION  AND INTRAOCULAR LENS PLACEMENT LEFT EYE CDE=2.68;  Surgeon: Baruch Goldmann, MD;  Location: [facility] ORS;  Service: Ophthalmology;  Laterality: Left;  left   CATARACT EXTRACTION W/PHACO Right 09/06/2017   Procedure: CATARACT EXTRACTION PHACO AND INTRAOCULAR LENS PLACEMENT (IOC);  Surgeon: Baruch Goldmann, MD;  Location: [facility] ORS;  Service: Ophthalmology;  Laterality: Right;  CDE: 1.61   COLONOSCOPY WITH PROPOFOL N/A 11/05/2017    6 mm polyp in the sigmoid colon which was sessile and removed.  Rectosigmoid colon and sigmoid colon biopsies taken for evaluation for microscopic colitis.  Intermittent rectal bleeding due to sigmoid colon polyp and internal hemorrhoids. Normal colonic biopsies. Tubular adenoma. Repeat surveillance 5-10 years.    ESOPHAGOGASTRODUODENOSCOPY  03/10/2008   SLF: Normal esophagus without evidence of Barrett, mass, erosion  ulceration, or stricture, small bowel bx negative, gastritis with NO h.pylori   ESOPHAGOGASTRODUODENOSCOPY (EGD) WITH PROPOFOL N/A 11/05/2017   Mild gastritis and duodenitis.    leocolonoscopy  03/10/2008   [id number] terminal ileum, approximately 10 cm visualized/Normal colon without evidence of polyps, mass, inflammatory changes, diverticula, or AVMs/Normal retroflexed view of the rectum. random colon bx negative.   MASTECTOMY     bilateral, status post reconstruction.   POLYPECTOMY  11/05/2017   Procedure: POLYPECTOMY;  Surgeon: Danie Binder, MD;  Location: [facility];  Service: Endoscopy;;  colon   TUBAL LIGATION      Family Psychiatric History: see below  Family History:  Family History  Problem Relation Age of Onset   COPD Mother    Depression Mother    Heart disease Mother    Hyperlipidemia Mother    Hypertension Mother    Cancer Mother        lung cancer, to bone   Cancer Father        skin   Heart disease Father    Hypertension Father    Hyperlipidemia Father    Learning disabilities Father    Drug abuse Brother        overdose at 61   Drug abuse Daughter        drug overdose 10   Cancer Maternal Uncle        lung cancer   Colon cancer Neg Hx     Social History:  Social History   Socioeconomic History   Marital status: Divorced    Spouse name: Not on file   Number of children: 2   Years of education: 72   Highest  education level: Not on file  Occupational History   Occupation: disabled    Comment: neuropathy from chemo  Tobacco Use   Smoking status: Every Day    Packs/day: 0.50    Years: 20.00    Pack years: 10.00    Types: Cigarettes    Start date: 05/07/1992   Smokeless tobacco: Never   Tobacco comments:    discussed  Substance and Sexual Activity   Alcohol use: Yes    Alcohol/week: 6.0 standard drinks    Types: 6 Shots of liquor per week    Comment: occ   Drug use: No   Sexual activity: Yes    Birth control/protection: Post-menopausal  Other Topics Concern   Not on file  Social History Narrative   Divorced   Lives alone   Spends time with boyfriend - he is a smoker   Scientist, physiological Strain: Not on file  Food Insecurity: Not on file  Transportation Needs: Not on file  Physical Activity: Not on file  Stress: Not on file  Social Connections: Not on file    Allergies:  Allergies  Allergen Reactions   Vicodin [Hydrocodone-Acetaminophen] Nausea Only    Metabolic Disorder Labs: No results found for: HGBA1C, MPG No results found for: PROLACTIN Lab Results  Component Value Date   CHOL 172 12/18/2019   TRIG 110 12/18/2019   HDL 53 12/18/2019   CHOLHDL 3.2 12/18/2019   VLDL 42 (H) 11/23/2016   LDLCALC 99 12/18/2019   LDLCALC 103 (H) 07/08/2017   Lab Results  Component Value Date   TSH 3.110 01/17/2021   TSH 2.620 12/18/2019    Therapeutic Level Labs: No results found for: LITHIUM No results found for: VALPROATE No components found for:  CBMZ  Current Medications: Current Outpatient Medications  Medication Sig Dispense Refill   albuterol (VENTOLIN HFA) 108 (90 Base) MCG/ACT inhaler Inhale 2 puffs into the lungs every 6 (six) hours as needed for wheezing or shortness of breath. 6.7 g 3   ARIPiprazole (ABILIFY) 5 MG tablet Take 1.5 tablets (7.5 mg total) by mouth daily. 45 tablet 2   Chlorpheniramine-DM (COUGH & COLD HBP PO) Take  by mouth as needed.     cholecalciferol (VITAMIN D) 1000 units tablet Take 1,000 Units by mouth daily.     Crisaborole (EUCRISA) 2 % OINT Apply once daily to face for eczema (has failed topical corticosteroids x3) 60 g PRN   diazepam (VALIUM) 10 MG tablet Take 1 tablet (10 mg total) by mouth 3 (three) times daily. 90 tablet 2   dicyclomine (BENTYL) 10 MG capsule Take 1 capsule by mouth twice daily 180 capsule 0   DULoxetine (CYMBALTA) 60 MG capsule Take 1 capsule (60 mg total) by mouth 2 (two) times daily. 180 capsule 2   fexofenadine (ALLEGRA) 180 MG tablet Take 1 tablet (180 mg total) by mouth daily as needed for allergies or rhinitis. 90 tablet 3   fluticasone (FLONASE) 50 MCG/ACT nasal spray Place 1 spray into both nostrils 2 (two) times daily as needed for allergies or rhinitis. 16 g 6   fluticasone (FLOVENT HFA) 220 MCG/ACT inhaler Inhale 2 puffs into the lungs 2 (two) times daily. 1 each 12   Loperamide HCl (ANTI-DIARRHEAL PO) Take by mouth as needed.     Multiple Vitamin (MULTIVITAMIN) capsule Take 1 capsule by mouth daily.     omeprazole (PRILOSEC) 40 MG capsule Take 1 capsule (40 mg total) by mouth daily. 90 capsule 3   pravastatin (PRAVACHOL) 20 MG tablet Take 1 tablet (20 mg total) by mouth daily. 90 tablet 3   prazosin (MINIPRESS) 5 MG capsule Take 1 capsule (5 mg total) by mouth at bedtime. 30 capsule 2   temazepam (RESTORIL) 30 MG capsule Take 1 capsule (30 mg total) by mouth at bedtime as needed for sleep. 30 capsule 2   triamcinolone cream (KENALOG) 0.1 % APPLY 1 APPLICATION TOPICALLY TWICE DAILY UNTIL RASH CLEARS (7 TO 10 DAYS) 30 g 0   varenicline (CHANTIX CONTINUING MONTH PAK) 1 MG tablet Take 1 tablet (1 mg total) by mouth 2 (two) times daily. 60 tablet 2   varenicline (CHANTIX STARTING MONTH PAK) 0.5 MG X 11 & 1 MG X  42 tablet Take 0.5 mg tablet by mouth once daily x3 days, then 0.5 mg tablet twice daily x4 days, then increase to one 1 mg tablet twice daily. 53 tablet 0    vitamin C (ASCORBIC ACID) 500 MG tablet Take 500 mg by mouth daily.     No current facility-administered medications for this visit.     Musculoskeletal: Strength & Muscle Tone: na Gait & Station: na Patient leans: N/A  Psychiatric Specialty Exam: Review of Systems  Gastrointestinal:  Positive for diarrhea.  All other systems reviewed and are negative.  Last menstrual period 07/09/2013.There is no height or weight on file to calculate BMI.  General Appearance: NA  Eye Contact:  NA  Speech:  Clear and Coherent  Volume:  Normal  Mood:  Euthymic  Affect:  NA  Thought Process:  Goal Directed  Orientation:  Full (Time, Place, and Person)  Thought Content: Rumination   Suicidal Thoughts:  No  Homicidal Thoughts:  No  Memory:  Immediate;   Good Recent;   Good Remote;   Fair  Judgement:  Good  Insight:  Fair  Psychomotor Activity:  Normal  Concentration:  Concentration: Good and Attention Span: Good  Recall:  Good  Fund of Knowledge: Good  Language: Good  Akathisia:  No  Handed:  Right  AIMS (if indicated): not done  Assets:  Communication Skills Desire for Improvement Resilience Social Support Talents/Skills  ADL's:  Intact  Cognition: WNL  Sleep:  Fair   Screenings: GAD-7    Flowsheet Row Office Visit from 01/17/2021 in [place] from 09/27/2020 in [place] [facility]  Total GAD-7 Score 17 19      PHQ2-9    Flowsheet Row Video Visit from 01/18/2021 in [place] Office Visit from 01/17/2021 in [place] from 09/27/2020 in [place] Video Visit from 09/16/2020 in [place] Video Visit from 07/20/2020 in [place] [facility]  PHQ-2 Total Score 0 '5 6 2 2  '$ PHQ-9 Total Score '3 18 20 12 9      '$ Flowsheet  Row Counselor from 09/27/2020 in [place] Video Visit from 09/16/2020 in [place] Video Visit from 07/20/2020 in [place] Error: Q7 should not be populated when Q6 is No Error: Q7 should not be populated when Q6 is No No Risk        Assessment and Plan: This patient is a 60 year old female with a history of depression anxiety and PTSD.  For the most part she is doing well.  She will continue Restoril 30 mg at bedtime for sleep, prazosin 5 mg at bedtime to prevent nightmares, Cymbalta 60 mg twice daily for depression, Abilify 7.5 mg daily for augmentation and Valium 10 mg 3 times daily for anxiety.  She will return to see me in 3 months   Judy Spiller, MD 01/19/2021, 3:57 PM

## 2021-01-20 LAB — FECAL OCCULT BLOOD, IMMUNOCHEMICAL: Fecal Occult Bld: NEGATIVE

## 2021-01-20 LAB — CLOSTRIDIUM DIFFICILE BY PCR: Toxigenic C. Difficile by PCR: NEGATIVE

## 2021-01-26 LAB — OVA AND PARASITE EXAMINATION

## 2021-04-07 ENCOUNTER — Other Ambulatory Visit: Payer: Self-pay | Admitting: Family Medicine

## 2021-04-07 DIAGNOSIS — K219 Gastro-esophageal reflux disease without esophagitis: Secondary | ICD-10-CM

## 2021-04-19 ENCOUNTER — Telehealth (INDEPENDENT_AMBULATORY_CARE_PROVIDER_SITE_OTHER): Payer: Medicare Other | Admitting: Psychiatry

## 2021-04-19 ENCOUNTER — Encounter (HOSPITAL_COMMUNITY): Payer: Self-pay | Admitting: Psychiatry

## 2021-04-19 ENCOUNTER — Other Ambulatory Visit: Payer: Self-pay

## 2021-04-19 DIAGNOSIS — F4310 Post-traumatic stress disorder, unspecified: Secondary | ICD-10-CM | POA: Diagnosis not present

## 2021-04-19 DIAGNOSIS — F332 Major depressive disorder, recurrent severe without psychotic features: Secondary | ICD-10-CM | POA: Diagnosis not present

## 2021-04-19 DIAGNOSIS — F411 Generalized anxiety disorder: Secondary | ICD-10-CM | POA: Diagnosis not present

## 2021-04-19 MED ORDER — PRAZOSIN HCL 5 MG PO CAPS: 5.0000 mg | ORAL_CAPSULE | Freq: Every day | ORAL | 2 refills | Status: DC

## 2021-04-19 MED ORDER — TEMAZEPAM 30 MG PO CAPS
30.0000 mg | ORAL_CAPSULE | Freq: Every evening | ORAL | 2 refills | Status: DC | PRN
Start: 2021-04-19 — End: 2021-06-28

## 2021-04-19 MED ORDER — DULOXETINE HCL 60 MG PO CPEP: 60.0000 mg | ORAL_CAPSULE | Freq: Two times a day (BID) | ORAL | 2 refills | Status: DC

## 2021-04-19 MED ORDER — DIAZEPAM 10 MG PO TABS: 10.0000 mg | ORAL_TABLET | Freq: Three times a day (TID) | ORAL | 2 refills | Status: DC

## 2021-04-19 MED ORDER — ARIPIPRAZOLE 5 MG PO TABS: 7.5000 mg | ORAL_TABLET | Freq: Every day | ORAL | 2 refills | Status: DC

## 2021-04-19 NOTE — Progress Notes (Signed)
Virtual Visit via Telephone Note  I connected with [name] on 04/19/21 at 11:20 AM EST by telephone and verified that I am speaking with the correct person using two identifiers.  Location: Patient: home Provider: office   I discussed the limitations, risks, security and privacy concerns of performing an evaluation and management service by telephone and the availability of in person appointments. I also discussed with the patient that there may be a patient responsible charge related to this service. The patient expressed understanding and agreed to proceed.      I discussed the assessment and treatment plan with the patient. The patient was provided an opportunity to ask questions and all were answered. The patient agreed with the plan and demonstrated an understanding of the instructions.   The patient was advised to call back or seek an in-person evaluation if the symptoms worsen or if the condition fails to improve as anticipated.  I provided 15 minutes of non-face-to-face time during this encounter.   Judy Spiller, MD  [facility] MD/PA/NP OP Progress Note  04/19/2021 11:42 AM [name]  MRN:  [id number]  Chief Complaint:  Chief Complaint   Depression; Anxiety; Follow-up    HPI: This patient is a 60 year old divorced white female who lives with her boyfriend in [place].  She has 1 grown son.  She has a daughter who died in 2011-08-03 of a narcotic overdose.  The patient is on disability.  The patient returns for follow-up after 3 months.  She states that she has good and bad days.  Her mood is somewhat up-and-down.  She is still trying to help her son gain custody of his child.  She states her main problem is that she does not feel that her boyfriend appreciates her and is too critical.  She has tried talking to him about this and the changes for a while and then reverts back to normal.  She has been telling me this for several years.  When asked why she is still in the  relationship she claims "I do not have anywhere else to go."  She denies significant depression or thoughts of self-harm or suicide.  The Valium continues to help with anxiety.  She still has vivid dreams but denies any current nightmares. Visit Diagnosis:    ICD-10-CM   1. Major depressive disorder, recurrent, severe without psychotic features ([place])  F33.2     2. Generalized anxiety disorder  F41.1     3. PTSD (post-traumatic stress disorder)  F43.10       Past Psychiatric History: Past outpatient treatment  Past Medical History:  Past Medical History:  Diagnosis Date   Allergy    Anxiety    Blood transfusion without reported diagnosis    Breast cancer ([place])    Breast cancer ([place]) 04/21/2012   Stage II (T1c N1 M0) grade 3 triple negative breast cancer, right- sided with 1 of 5 positive nodes, status post FEC in a dose dense fashion for 6 cycles followed by radiation therapy by Dr. Sondra Come for her high-risk disease with her initial date of surgery in February 2008.    Chemotherapy-induced neuropathy ([place]) 06/21/2016   COPD (chronic obstructive pulmonary disease) (HCC)    Depression    Emphysema of lung (HCC)    GERD (gastroesophageal reflux disease) 12/18/2012   Headache    Hyperlipidemia    Neuropathy    Osteoporosis    PONV (postoperative nausea and vomiting)    PTSD (post-traumatic stress disorder)  Substance abuse [facility])     Past Surgical History:  Procedure Laterality Date   BIOPSY  11/05/2017   Procedure: BIOPSY;  Surgeon: Danie Binder, MD;  Location: [facility];  Service: Endoscopy;;  colon duodenum gastric   BREAST SURGERY     CATARACT EXTRACTION W/PHACO Left 08/15/2017   Procedure: CATARACT EXTRACTION WITH PHACOEMULSIFICATION  AND INTRAOCULAR LENS PLACEMENT LEFT EYE CDE=2.68;  Surgeon: Baruch Goldmann, MD;  Location: [facility] ORS;  Service: Ophthalmology;  Laterality: Left;  left   CATARACT EXTRACTION W/PHACO Right 09/06/2017   Procedure: CATARACT EXTRACTION PHACO AND  INTRAOCULAR LENS PLACEMENT (I[facility]);  Surgeon: Baruch Goldmann, MD;  Location: [facility] ORS;  Service: Ophthalmology;  Laterality: Right;  CDE: 1.61   COLONOSCOPY WITH PROPOFOL N/A 11/05/2017    6 mm polyp in the sigmoid colon which was sessile and removed.  Rectosigmoid colon and sigmoid colon biopsies taken for evaluation for microscopic colitis.  Intermittent rectal bleeding due to sigmoid colon polyp and internal hemorrhoids. Normal colonic biopsies. Tubular adenoma. Repeat surveillance 5-10 years.    ESOPHAGOGASTRODUODENOSCOPY  03/10/2008   SLF: Normal esophagus without evidence of Barrett, mass, erosion  ulceration, or stricture, small bowel bx negative, gastritis with NO h.pylori   ESOPHAGOGASTRODUODENOSCOPY (EGD) WITH PROPOFOL N/A 11/05/2017   Mild gastritis and duodenitis.    leocolonoscopy  03/10/2008   NOB:[id number] terminal ileum, approximately 10 cm visualized/Normal colon without evidence of polyps, mass, inflammatory changes, diverticula, or AVMs/Normal retroflexed view of the rectum. random colon bx negative.   MASTECTOMY     bilateral, status post reconstruction.   POLYPECTOMY  11/05/2017   Procedure: POLYPECTOMY;  Surgeon: Danie Binder, MD;  Location: [facility];  Service: Endoscopy;;  colon   TUBAL LIGATION      Family Psychiatric History: see below  Family History:  Family History  Problem Relation Age of Onset   COPD Mother    Depression Mother    Heart disease Mother    Hyperlipidemia Mother    Hypertension Mother    Cancer Mother        lung cancer, to bone   Cancer Father        skin   Heart disease Father    Hypertension Father    Hyperlipidemia Father    Learning disabilities Father    Drug abuse Brother        overdose at 66   Drug abuse Daughter        drug overdose 55   Cancer Maternal Uncle        lung cancer   Colon cancer Neg Hx     Social History:  Social History   Socioeconomic History   Marital status: Divorced    Spouse name: Not on file    Number of children: 2   Years of education: 87   Highest education level: Not on file  Occupational History   Occupation: disabled    Comment: neuropathy from chemo  Tobacco Use   Smoking status: Every Day    Packs/day: 0.50    Years: 20.00    Pack years: 10.00    Types: Cigarettes    Start date: 05/07/1992   Smokeless tobacco: Never   Tobacco comments:    discussed  Substance and Sexual Activity   Alcohol use: Yes    Alcohol/week: 6.0 standard drinks    Types: 6 Shots of liquor per week    Comment: occ   Drug use: No   Sexual activity: Yes    Birth  control/protection: Post-menopausal  Other Topics Concern   Not on file  Social History Narrative   Divorced   Lives alone   Spends time with boyfriend - he is a smoker   Scientist, physiological Strain: Not on file  Food Insecurity: Not on file  Transportation Needs: Not on file  Physical Activity: Not on file  Stress: Not on file  Social Connections: Not on file    Allergies:  Allergies  Allergen Reactions   Vicodin [Hydrocodone-Acetaminophen] Nausea Only    Metabolic Disorder Labs: No results found for: HGBA1C, MPG No results found for: PROLACTIN Lab Results  Component Value Date   CHOL 172 12/18/2019   TRIG 110 12/18/2019   HDL 53 12/18/2019   CHOLHDL 3.2 12/18/2019   VLDL 42 (H) 11/23/2016   LDLCALC 99 12/18/2019   LDLCALC 103 (H) 07/08/2017   Lab Results  Component Value Date   TSH 3.110 01/17/2021   TSH 2.620 12/18/2019    Therapeutic Level Labs: No results found for: LITHIUM No results found for: VALPROATE No components found for:  CBMZ  Current Medications: Current Outpatient Medications  Medication Sig Dispense Refill   albuterol (VENTOLIN HFA) 108 (90 Base) MCG/ACT inhaler Inhale 2 puffs into the lungs every 6 (six) hours as needed for wheezing or shortness of breath. 6.7 g 3   ARIPiprazole (ABILIFY) 5 MG tablet Take 1.5 tablets (7.5 mg total) by mouth daily. 45  tablet 2   Chlorpheniramine-DM (COUGH & COLD HBP PO) Take by mouth as needed.     cholecalciferol (VITAMIN D) 1000 units tablet Take 1,000 Units by mouth daily.     Crisaborole (EUCRISA) 2 % OINT Apply once daily to face for eczema (has failed topical corticosteroids x3) 60 g PRN   diazepam (VALIUM) 10 MG tablet Take 1 tablet (10 mg total) by mouth 3 (three) times daily. 90 tablet 2   dicyclomine (BENTYL) 10 MG capsule Take 1 capsule by mouth twice daily 180 capsule 0   DULoxetine (CYMBALTA) 60 MG capsule Take 1 capsule (60 mg total) by mouth 2 (two) times daily. 180 capsule 2   fexofenadine (ALLEGRA) 180 MG tablet Take 1 tablet (180 mg total) by mouth daily as needed for allergies or rhinitis. 90 tablet 3   fluticasone (FLONASE) 50 MCG/ACT nasal spray Place 1 spray into both nostrils 2 (two) times daily as needed for allergies or rhinitis. 16 g 6   fluticasone (FLOVENT HFA) 220 MCG/ACT inhaler Inhale 2 puffs into the lungs 2 (two) times daily. 1 each 12   Loperamide HCl (ANTI-DIARRHEAL PO) Take by mouth as needed.     Multiple Vitamin (MULTIVITAMIN) capsule Take 1 capsule by mouth daily.     omeprazole (PRILOSEC) 40 MG capsule Take 1 capsule by mouth once daily 90 capsule 0   pravastatin (PRAVACHOL) 20 MG tablet Take 1 tablet (20 mg total) by mouth daily. 90 tablet 3   prazosin (MINIPRESS) 5 MG capsule Take 1 capsule (5 mg total) by mouth at bedtime. 30 capsule 2   temazepam (RESTORIL) 30 MG capsule Take 1 capsule (30 mg total) by mouth at bedtime as needed for sleep. 30 capsule 2   triamcinolone cream (KENALOG) 0.1 % APPLY 1 APPLICATION TOPICALLY TWICE DAILY UNTIL RASH CLEARS (7 TO 10 DAYS) 30 g 0   varenicline (CHANTIX CONTINUING MONTH PAK) 1 MG tablet Take 1 tablet (1 mg total) by mouth 2 (two) times daily. 60 tablet 2   varenicline (CHANTIX STARTING  MONTH PAK) 0.5 MG X 11 & 1 MG X 42 tablet Take 0.5 mg tablet by mouth once daily x3 days, then 0.5 mg tablet twice daily x4 days, then increase to  one 1 mg tablet twice daily. 53 tablet 0   vitamin C (ASCORBIC ACID) 500 MG tablet Take 500 mg by mouth daily.     No current facility-administered medications for this visit.     Musculoskeletal: Strength & Muscle Tone: na Gait & Station: na Patient leans: N/A  Psychiatric Specialty Exam: Review of Systems  Gastrointestinal:  Positive for diarrhea.  All other systems reviewed and are negative.  Last menstrual period 07/09/2013.There is no height or weight on file to calculate BMI.  General Appearance: NA  Eye Contact:  NA  Speech:  Clear and Coherent  Volume:  Normal  Mood:  Dysphoric  Affect:  NA  Thought Process:  Goal Directed  Orientation:  Full (Time, Place, and Person)  Thought Content: Rumination   Suicidal Thoughts:  No  Homicidal Thoughts:  No  Memory:  Immediate;   Good Recent;   Good Remote;   Good  Judgement:  Good  Insight:  Fair  Psychomotor Activity:  Normal  Concentration:  Concentration: Good and Attention Span: Good  Recall:  Good  Fund of Knowledge: Good  Language: Good  Akathisia:  No  Handed:  Right  AIMS (if indicated): not done  Assets:  Communication Skills Desire for Improvement Resilience Social Support Talents/Skills  ADL's:  Intact  Cognition: WNL  Sleep:  Fair   Screenings: GAD-7    Flowsheet Row Office Visit from 01/17/2021 in [place] from 09/27/2020 in [facility]  Total GAD-7 Score 17 19      PHQ2-9    Flowsheet Row Video Visit from 04/19/2021 in [place] [facility] Video Visit from 01/18/2021 in [place] Office Visit from 01/17/2021 in [place] from 09/27/2020 in [place] [facility] Video Visit from 09/16/2020 in [place] [facility]  PHQ-2 Total Score 2 0 5 6 2    PHQ-9 Total Score 7 3 18 20 12       Flowsheet Row Video Visit from 04/19/2021 in [place] from 09/27/2020 in [place] [facility] [facility] Video Visit from 09/16/2020 in [place] No Risk Error: Q7 should not be populated when Q6 is No Error: Q7 should not be populated when Q6 is No        Assessment and Plan: This patient is a 60 year old female with a history of depression anxiety and PTSD.  For the most part she is doing well.  She is having relationship issues and I think she would do well to get back into therapy which we will try to get started again in our office.  She will continue Restoril 30 mg at bedtime for sleep, prazosin 5 mg at bedtime to prevent nightmares, Cymbalta 60 mg twice daily for depression, Abilify 7.5 mg daily for augmentation and Valium 10 mg 3 times daily for anxiety.  She will return to see me in 3 months  Judy Spiller, MD 04/19/2021, 11:42 AM

## 2021-04-20 ENCOUNTER — Telehealth: Payer: Self-pay | Admitting: Family Medicine

## 2021-04-20 NOTE — Telephone Encounter (Signed)
Called to schedule an AWV

## 2021-04-26 ENCOUNTER — Ambulatory Visit (INDEPENDENT_AMBULATORY_CARE_PROVIDER_SITE_OTHER): Payer: Medicare Other

## 2021-04-26 VITALS — Ht 64.0 in | Wt 174.0 lb

## 2021-04-26 DIAGNOSIS — Z0000 Encounter for general adult medical examination without abnormal findings: Secondary | ICD-10-CM | POA: Diagnosis not present

## 2021-04-26 NOTE — Patient Instructions (Signed)
[name] , °Thank you for taking time to come for your Medicare Wellness Visit. I appreciate your ongoing commitment to your health goals. Please review the following plan we discussed and let me know if I can assist you in the future.  ° °Screening recommendations/referrals: °Colonoscopy: Done 11/05/2017 - Repeat in 10 years  °Mammogram: no longer required due for mastectomy °Bone Density: Due at age 60 °Recommended yearly ophthalmology/optometry visit for glaucoma screening and checkup °Recommended yearly dental visit for hygiene and checkup ° °Vaccinations: °Influenza vaccine: Done 05/11/2020 - repeat every fall *Due now °Pneumococcal vaccine: Done 03/14/2018 - ask about Prevnar-20 °Tdap vaccine: Done 2022 at pharmacy °Shingles vaccine: First dose done at pharmacy 2022 - we need date please  °Covid-19: Declined ° °Advanced directives: Please bring a copy of your health care power of attorney and living will to the office to be added to your chart at your convenience.  ° °Conditions/risks identified: Aim for 30 minutes of exercise or brisk walking each day, drink 6-8 glasses of water and eat lots of fruits and vegetables.  ° °Next appointment: Follow up in one year for your annual wellness visit.  ° °Preventive Care 40-64 Years, Female °Preventive care refers to lifestyle choices and visits with your health care provider that can promote health and wellness. °What does preventive care include? °A yearly physical exam. This is also called an annual well check. °Dental exams once or twice a year. °Routine eye exams. Ask your health care provider how often you should have your eyes checked. °Personal lifestyle choices, including: °Daily care of your teeth and gums. °Regular physical activity. °Eating a healthy diet. °Avoiding tobacco and drug use. °Limiting alcohol use. °Practicing safe sex. °Taking low-dose aspirin daily starting at age 50. °Taking vitamin and mineral supplements as recommended by your health care  provider. °What happens during an annual well check? °The services and screenings done by your health care provider during your annual well check will depend on your age, overall health, lifestyle risk factors, and family history of disease. °Counseling  °Your health care provider may ask you questions about your: °Alcohol use. °Tobacco use. °Drug use. °Emotional well-being. °Home and relationship well-being. °Sexual activity. °Eating habits. °Work and work environment. °Method of birth control. °Menstrual cycle. °Pregnancy history. °Screening  °You may have the following tests or measurements: °Height, weight, and BMI. °Blood pressure. °Lipid and cholesterol levels. These may be checked every 5 years, or more frequently if you are over 50 years old. °Skin check. °Lung cancer screening. You may have this screening every year starting at age 55 if you have a 30-pack-year history of smoking and currently smoke or have quit within the past 15 years. °Fecal occult blood test (FOBT) of the stool. You may have this test every year starting at age 50. °Flexible sigmoidoscopy or colonoscopy. You may have a sigmoidoscopy every 5 years or a colonoscopy every 10 years starting at age 50. °Hepatitis C blood test. °Hepatitis B blood test. °Sexually transmitted disease (STD) testing. °Diabetes screening. This is done by checking your blood sugar (glucose) after you have not eaten for a while (fasting). You may have this done every 1-3 years. °Mammogram. This may be done every 1-2 years. Talk to your health care provider about when you should start having regular mammograms. This may depend on whether you have a family history of breast cancer. °BRCA-related cancer screening. This may be done if you have a family history of breast, ovarian, tubal, or peritoneal cancers. °  Pelvic exam and Pap test. This may be done every 3 years starting at age 21. Starting at age 30, this may be done every 5 years if you have a Pap test in  combination with an HPV test. °Bone density scan. This is done to screen for osteoporosis. You may have this scan if you are at high risk for osteoporosis. °Discuss your test results, treatment options, and if necessary, the need for more tests with your health care provider. °Vaccines  °Your health care provider may recommend certain vaccines, such as: °Influenza vaccine. This is recommended every year. °Tetanus, diphtheria, and acellular pertussis (Tdap, Td) vaccine. You may need a Td booster every 10 years. °Zoster vaccine. You may need this after age 60. °Pneumococcal 13-valent conjugate (PCV13) vaccine. You may need this if you have certain conditions and were not previously vaccinated. °Pneumococcal polysaccharide (PPSV23) vaccine. You may need one or two doses if you smoke cigarettes or if you have certain conditions. °Talk to your health care provider about which screenings and vaccines you need and how often you need them. °This information is not intended to replace advice given to you by your health care provider. Make sure you discuss any questions you have with your health care provider. °Document Released: 05/20/2015 Document Revised: 01/11/2016 Document Reviewed: 02/22/2015 °Elsevier Interactive Patient Education © 2017 [employer] ° ° ° °Fall Prevention in the Home °Falls can cause injuries. They can happen to people of all ages. There are many things you can do to make your home safe and to help prevent falls. °What can I do on the outside of my home? °Regularly fix the edges of walkways and driveways and fix any cracks. °Remove anything that might make you trip as you walk through a door, such as a raised step or threshold. °Trim any bushes or trees on the path to your home. °Use bright outdoor lighting. °Clear any walking paths of anything that might make someone trip, such as rocks or tools. °Regularly check to see if handrails are loose or broken. Make sure that both sides of any steps have  handrails. °Any raised decks and porches should have guardrails on the edges. °Have any leaves, snow, or ice cleared regularly. °Use sand or salt on walking paths during winter. °Clean up any spills in your garage right away. This includes oil or grease spills. °What can I do in the bathroom? °Use night lights. °Install grab bars by the toilet and in the tub and shower. Do not use towel bars as grab bars. °Use non-skid mats or decals in the tub or shower. °If you need to sit down in the shower, use a plastic, non-slip stool. °Keep the floor dry. Clean up any water that spills on the floor as soon as it happens. °Remove soap buildup in the tub or shower regularly. °Attach bath mats securely with double-sided non-slip rug tape. °Do not have throw rugs and other things on the floor that can make you trip. °What can I do in the bedroom? °Use night lights. °Make sure that you have a light by your bed that is easy to reach. °Do not use any sheets or blankets that are too big for your bed. They should not hang down onto the floor. °Have a firm chair that has side arms. You can use this for support while you get dressed. °Do not have throw rugs and other things on the floor that can make you trip. °What can I do in the kitchen? °  Clean up any spills right away. Avoid walking on wet floors. Keep items that you use a lot in easy-to-reach places. If you need to reach something above you, use a strong step stool that has a grab bar. Keep electrical cords out of the way. Do not use floor polish or wax that makes floors slippery. If you must use wax, use non-skid floor wax. Do not have throw rugs and other things on the floor that can make you trip. What can I do with my stairs? Do not leave any items on the stairs. Make sure that there are handrails on both sides of the stairs and use them. Fix handrails that are broken or loose. Make sure that handrails are as long as the stairways. Check any carpeting to make sure  that it is firmly attached to the stairs. Fix any carpet that is loose or worn. Avoid having throw rugs at the top or bottom of the stairs. If you do have throw rugs, attach them to the floor with carpet tape. Make sure that you have a light switch at the top of the stairs and the bottom of the stairs. If you do not have them, ask someone to add them for you. What else can I do to help prevent falls? Wear shoes that: Do not have high heels. Have rubber bottoms. Are comfortable and fit you well. Are closed at the toe. Do not wear sandals. If you use a stepladder: Make sure that it is fully opened. Do not climb a closed stepladder. Make sure that both sides of the stepladder are locked into place. Ask someone to hold it for you, if possible. Clearly mark and make sure that you can see: Any grab bars or handrails. First and last steps. Where the edge of each step is. Use tools that help you move around (mobility aids) if they are needed. These include: Canes. Walkers. Scooters. Crutches. Turn on the lights when you go into a dark area. Replace any light bulbs as soon as they burn out. Set up your furniture so you have a clear path. Avoid moving your furniture around. If any of your floors are uneven, fix them. If there are any pets around you, be aware of where they are. Review your medicines with your doctor. Some medicines can make you feel dizzy. This can increase your chance of falling. Ask your doctor what other things that you can do to help prevent falls. This information is not intended to replace advice given to you by your health care provider. Make sure you discuss any questions you have with your health care provider. Document Released: 02/17/2009 Document Revised: 09/29/2015 Document Reviewed: 05/28/2014 Elsevier Interactive Patient Education  2017 [employer].

## 2021-04-26 NOTE — Progress Notes (Signed)
Subjective:   [name] is a 60 y.o. female who presents for an Initial Medicare Annual Wellness Visit.  Virtual Visit via Telephone Note  I connected with  [name] on 04/26/21 at  3:30 PM EST by telephone and verified that I am speaking with the correct person using two identifiers.  Location: Patient: Home Provider: [facility] Persons participating in the virtual visit: patient/Nurse Health Advisor   I discussed the limitations, risks, security and privacy concerns of performing an evaluation and management service by telephone and the availability of in person appointments. The patient expressed understanding and agreed to proceed.  Interactive audio and video telecommunications were attempted between this nurse and patient, however failed, due to patient having technical difficulties OR patient did not have access to video capability.  We continued and completed visit with audio only.  Some vital signs may be absent or patient reported.   Kerry-Anne Mezo E Marysue Fait, LPN   Review of Systems     Cardiac Risk Factors include: sedentary lifestyle;dyslipidemia;smoking/ tobacco exposure;Other (see comment), Risk factor comments: COPD     Objective:    Today's Vitals   04/26/21 1528  Weight: 174 lb (78.9 kg)  Height: 5\' 4"  (1.626 m)   Body mass index is 29.87 kg/m.  Advanced Directives 04/26/2021 11/05/2017 09/06/2017 08/15/2017 05/30/2017 05/30/2016 03/20/2016  Does Patient Have a Medical Advance Directive? No No No No No No No  Would patient like information on creating a medical advance directive? No - Patient declined No - Patient declined No - Patient declined No - Patient declined No - Patient declined No - Patient declined No - patient declined information  Some encounter information is confidential and restricted. Go to Review Flowsheets activity to see all data.    Current Medications (verified) Outpatient Encounter Medications as of 04/26/2021  Medication Sig   albuterol  (VENTOLIN HFA) 108 (90 Base) MCG/ACT inhaler Inhale 2 puffs into the lungs every 6 (six) hours as needed for wheezing or shortness of breath.   ARIPiprazole (ABILIFY) 5 MG tablet Take 1.5 tablets (7.5 mg total) by mouth daily.   Chlorpheniramine-DM (COUGH & COLD HBP PO) Take by mouth as needed.   cholecalciferol (VITAMIN D) 1000 units tablet Take 1,000 Units by mouth daily.   Crisaborole (EUCRISA) 2 % OINT Apply once daily to face for eczema (has failed topical corticosteroids x3)   diazepam (VALIUM) 10 MG tablet Take 1 tablet (10 mg total) by mouth 3 (three) times daily.   dicyclomine (BENTYL) 10 MG capsule Take 1 capsule by mouth twice daily   DULoxetine (CYMBALTA) 60 MG capsule Take 1 capsule (60 mg total) by mouth 2 (two) times daily.   fexofenadine (ALLEGRA) 180 MG tablet Take 1 tablet (180 mg total) by mouth daily as needed for allergies or rhinitis.   fluticasone (FLONASE) 50 MCG/ACT nasal spray Place 1 spray into both nostrils 2 (two) times daily as needed for allergies or rhinitis.   fluticasone (FLOVENT HFA) 220 MCG/ACT inhaler Inhale 2 puffs into the lungs 2 (two) times daily.   Loperamide HCl (ANTI-DIARRHEAL PO) Take by mouth as needed.   Multiple Vitamin (MULTIVITAMIN) capsule Take 1 capsule by mouth daily.   omeprazole (PRILOSEC) 40 MG capsule Take 1 capsule by mouth once daily   pravastatin (PRAVACHOL) 20 MG tablet Take 1 tablet (20 mg total) by mouth daily.   prazosin (MINIPRESS) 5 MG capsule Take 1 capsule (5 mg total) by mouth at bedtime.   temazepam (RESTORIL) 30 MG capsule  Take 1 capsule (30 mg total) by mouth at bedtime as needed for sleep.   triamcinolone cream (KENALOG) 0.1 % APPLY 1 APPLICATION TOPICALLY TWICE DAILY UNTIL RASH CLEARS (7 TO 10 DAYS)   vitamin C (ASCORBIC ACID) 500 MG tablet Take 500 mg by mouth daily.   varenicline (CHANTIX CONTINUING MONTH PAK) 1 MG tablet Take 1 tablet (1 mg total) by mouth 2 (two) times daily. (Patient not taking: Reported on 04/26/2021)    varenicline (CHANTIX STARTING MONTH PAK) 0.5 MG X 11 & 1 MG X 42 tablet Take 0.5 mg tablet by mouth once daily x3 days, then 0.5 mg tablet twice daily x4 days, then increase to one 1 mg tablet twice daily. (Patient not taking: Reported on 04/26/2021)   No facility-administered encounter medications on file as of 04/26/2021.    Allergies (verified) Vicodin [hydrocodone-acetaminophen]   History: Past Medical History:  Diagnosis Date   Allergy    Anxiety    Blood transfusion without reported diagnosis    Breast cancer ([place])    Breast cancer ([place]) 04/21/2012   Stage II (T1c N1 M0) grade 3 triple negative breast cancer, right- sided with 1 of 5 positive nodes, status post FEC in a dose dense fashion for 6 cycles followed by radiation therapy by Dr. Sondra Come for her high-risk disease with her initial date of surgery in February 2008.    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   COPD (chronic obstructive pulmonary disease) (HCC)    Depression    Emphysema of lung (HCC)    GERD (gastroesophageal reflux disease) 12/18/2012   Headache    Hyperlipidemia    Neuropathy    Osteoporosis    PONV (postoperative nausea and vomiting)    PTSD (post-traumatic stress disorder)    Substance abuse [facility])    Past Surgical History:  Procedure Laterality Date   BIOPSY  11/05/2017   Procedure: BIOPSY;  Surgeon: Danie Binder, MD;  [facility];  Service: Endoscopy;;  colon duodenum gastric   BREAST SURGERY     CATARACT EXTRACTION W/PHACO Left 08/15/2017   Procedure: CATARACT EXTRACTION WITH PHACOEMULSIFICATION  AND INTRAOCULAR LENS PLACEMENT LEFT EYE CDE=2.68;  Surgeon: Baruch Goldmann, MD;  Location: [facility] OR[facility];  Service: Ophthalmology;  Laterality: Left;  left   CATARACT EXTRACTION W/PHACO Right 09/06/2017   Procedure: CATARACT EXTRACTION PHACO AND INTRAOCULAR LENS PLACEMENT (IOC);  Surgeon: Baruch Goldmann, MD;  Location: [facility];  Service: Ophthalmology;  Laterality: Right;  CDE: 1.61    COLONOSCOPY WITH PROPOFOL N/A 11/05/2017    6 mm polyp in the sigmoid colon which was sessile and removed.  Rectosigmoid colon and sigmoid colon biopsies taken for evaluation for microscopic colitis.  Intermittent rectal bleeding due to sigmoid colon polyp and internal hemorrhoids. Normal colonic biopsies. Tubular adenoma. Repeat surveillance 5-10 years.    ESOPHAGOGASTRODUODENOSCOPY  03/10/2008   SLF: Normal esophagus without evidence of Barrett, mass, erosion  ulceration, or stricture, small bowel bx negative, gastritis with NO h.pylori   ESOPHAGOGASTRODUODENOSCOPY (EGD) WITH PROPOFOL N/A 11/05/2017   Mild gastritis and duodenitis.    leocolonoscopy  03/10/2008   XLK:GMWNUU terminal ileum, approximately 10 cm visualized/Normal colon without evidence of polyps, mass, inflammatory changes, diverticula, or AVMs/Normal retroflexed view of the rectum. random colon bx negative.   MASTECTOMY     bilateral, status post reconstruction.   POLYPECTOMY  11/05/2017   Procedure: POLYPECTOMY;  Surgeon: Danie Binder, MD;  [facility];  Service: Endoscopy;;  colon   TUBAL LIGATION  Family History  Problem Relation Age of Onset   COPD Mother    Depression Mother    Heart disease Mother    Hyperlipidemia Mother    Hypertension Mother    Cancer Mother        lung cancer, to bone   Cancer Father        skin   Heart disease Father    Hypertension Father    Hyperlipidemia Father    Learning disabilities Father    Drug abuse Brother        overdose at 46   Drug abuse Daughter        drug overdose 74   Cancer Maternal Uncle        lung cancer   Colon cancer Neg Hx    Social History   Socioeconomic History   Marital status: Divorced    Spouse name: Not on file   Number of children: 2   Years of education: 13   Highest education level: Not on file  Occupational History   Occupation: disabled    Comment: neuropathy from chemo  Tobacco Use   Smoking status: Every Day    Packs/day:  0.50    Years: 20.00    Pack years: 10.00    Types: Cigarettes    Start date: 05/07/1992   Smokeless tobacco: Never   Tobacco comments:    discussed  Substance and Sexual Activity   Alcohol use: Not Currently    Alcohol/week: 14.0 standard drinks    Types: 14 Cans of beer per week    Comment: nightly   Drug use: No   Sexual activity: Yes    Birth control/protection: Post-menopausal  Other Topics Concern   Not on file  Social History Narrative   Divorced   Lives alone   Spends time with boyfriend - he is a smoker   Investment banker, operational of Radio broadcast assistant Strain: Low Risk    Difficulty of Paying Living Expenses: Not hard at all  Food Insecurity: No Food Insecurity   Worried About Charity fundraiser in the Last Year: Never true   Arboriculturist in the Last Year: Never true  Transportation Needs: No Transportation Needs   Lack of Transportation (Medical): No   Lack of Transportation (Non-Medical): No  Physical Activity: Insufficiently Active   Days of Exercise per Week: 7 days   Minutes of Exercise per Session: 20 min  Stress: No Stress Concern Present   Feeling of Stress : Only a little  Social Connections: Moderately Isolated   Frequency of Communication with Friends and Family: More than three times a week   Frequency of Social Gatherings with Friends and Family: Twice a week   Attends Religious Services: Never   Marine scientist or Organizations: No   Attends Music therapist: Never   Marital Status: Living with partner    Tobacco Counseling Ready to quit: Not Answered Counseling given: Not Answered Tobacco comments: discussed   Clinical Intake:  Pre-visit preparation completed: Yes  Pain : No/denies pain     BMI - recorded: 29.87 Nutritional Status: BMI 25 -29 Overweight Nutritional Risks: None Diabetes: No  How often do you need to have someone help you when you read instructions, pamphlets, or other written materials  from your doctor or pharmacy?: 1 - Never  Diabetic? no  Interpreter Needed?: No  Information entered by :: Rahim Astorga, LPN   Activities of Daily Living In your present  state of health, do you have any difficulty performing the following activities: 04/26/2021  Hearing? Y  Comment mild  Vision? N  Difficulty concentrating or making decisions? Y  Walking or climbing stairs? N  Dressing or bathing? N  Doing errands, shopping? N  Preparing Food and eating ? N  Using the Toilet? N  In the past six months, have you accidently leaked urine? N  Do you have problems with loss of bowel control? N  Managing your Medications? N  Managing your Finances? N  Housekeeping or managing your Housekeeping? N  Some recent data might be hidden    Patient Care Team: [name], DO as PCP - General (Family Medicine) Gery Pray, MD as Consulting Physician (Radiation Oncology) Ilean China, RN as Registered Nurse  Indicate any recent Medical Services you may have received from other than [facility] providers in the past year (date may be approximate).     Assessment:   This is a routine wellness examination for [name].  Hearing/Vision screen Hearing Screening - Comments:: C/o mild hearing difficulties  Vision Screening - Comments:: Wears rx glasses - up to date with annual eye exams with [facility]  Dietary issues and exercise activities discussed: Current Exercise Habits: Home exercise routine, Type of exercise: walking, Time (Minutes): 20, Frequency (Times/Week): 7, Weekly Exercise (Minutes/Week): 140, Intensity: Mild, Exercise limited by: neurologic condition(s);respiratory conditions(s)   Goals Addressed             This Visit's Progress    Quit smoking / using tobacco   Not on track      Depression Screen PHQ 2/9 Scores 04/26/2021 01/17/2021 12/21/2019 03/14/2018 09/02/2017 11/23/2016 06/21/2016  PHQ - 2 Score 2 5 3 4 4  0 4  PHQ- 9 Score 6 18 13 18 9  - 14  Some  encounter information is confidential and restricted. Go to Review Flowsheets activity to see all data.    Fall Risk Fall Risk  04/26/2021 01/17/2021 12/21/2019 09/02/2017 06/21/2016  Falls in the past year? 1 1 0 No No  Number falls in past yr: 1 1 - - -  Injury with Fall? 1 0 - - -  Risk for fall due to : History of fall(s);Impaired balance/gait;Orthopedic patient;Other (Comment) History of fall(s) - - -  Risk for fall due to: Comment neuropathy - - - -  Follow up Education provided;Falls prevention discussed Education provided - - -    FALL RISK PREVENTION PERTAINING TO THE HOME:  Any stairs in or around the home? Yes  If so, are there any without handrails? No  Home free of loose throw rugs in walkways, pet beds, electrical cords, etc? Yes  Adequate lighting in your home to reduce risk of falls? Yes   ASSISTIVE DEVICES UTILIZED TO PREVENT FALLS:  Life alert? No  Use of a cane, walker or w/c? No  Grab bars in the bathroom? No  Shower chair or bench in shower? Yes  Elevated toilet seat or a handicapped toilet? No   TIMED UP AND GO:  Was the test performed? No . Telephonic visit  Cognitive Function:     6CIT Screen 04/26/2021  What Year? 0 points  What month? 0 points  What time? 0 points  Count back from 20 0 points  Months in reverse 4 points  Repeat phrase 2 points  Total Score 6    Immunizations Immunization History  Administered Date(s) Administered   Influenza Whole 03/21/2016   Influenza,inj,Quad PF,6+ Mos 02/14/2015, 03/14/2018, 05/11/2020  Pneumococcal Polysaccharide-23 03/14/2018    TDAP status: Up to date  Flu Vaccine status: Due, Education has been provided regarding the importance of this vaccine. Advised may receive this vaccine at local pharmacy or Health Dept. Aware to provide a copy of the vaccination record if obtained from local pharmacy or Health Dept. Verbalized acceptance and understanding.  Pneumococcal vaccine status: Due, Education has  been provided regarding the importance of this vaccine. Advised may receive this vaccine at local pharmacy or Health Dept. Aware to provide a copy of the vaccination record if obtained from local pharmacy or Health Dept. Verbalized acceptance and understanding.  Covid-19 vaccine status: Declined, Education has been provided regarding the importance of this vaccine but patient still declined. Advised may receive this vaccine at local pharmacy or Health Dept.or vaccine clinic. Aware to provide a copy of the vaccination record if obtained from local pharmacy or Health Dept. Verbalized acceptance and understanding.  Qualifies for Shingles Vaccine? Yes   Zostavax completed Yes   Shingrix Completed?: No.    Education has been provided regarding the importance of this vaccine. Patient has been advised to call insurance company to determine out of pocket expense if they have not yet received this vaccine. Advised may also receive vaccine at local pharmacy or Health Dept. Verbalized acceptance and understanding.  Screening Tests Health Maintenance  Topic Date Due   COVID-19 Vaccine (1) Never done   Zoster Vaccines- Shingrix (1 of 2) Never done   PAP SMEAR-Modifier  11/24/2019   INFLUENZA VACCINE  08/04/2021 (Originally 12/05/2020)   Pneumococcal Vaccine 12-90 Years old (2 - PCV) 01/17/2022 (Originally 03/15/2019)   TETANUS/TDAP  01/17/2022 (Originally [birth year]1981)   COLONOSCOPY (Pts 45-71yrs Insurance coverage will need to be confirmed)  11/06/2027   Hepatitis C Screening  Completed   HIV Screening  Completed   HPV VACCINES  Aged Out   MAMMOGRAM  Discontinued    Health Maintenance  Health Maintenance Due  Topic Date Due   COVID-19 Vaccine (1) Never done   Zoster Vaccines- Shingrix (1 of 2) Never done   PAP SMEAR-Modifier  11/24/2019    Colorectal cancer screening: Type of screening: Colonoscopy. Completed 11/05/2017. Repeat every 10 years  Mammogram status: No longer required due to bilateral  mastectomy.  Bone density due at age 65  Lung Cancer Screening: (Low Dose CT Chest recommended if Age 92-80 years, 30 pack-year currently smoking OR have quit w/in 15years.) does qualify.   Lung Cancer Screening Referral: she has these done every year  Additional Screening:  Hepatitis C Screening: does qualify; Completed 07/08/2017  Vision Screening: Recommended annual ophthalmology exams for early detection of glaucoma and other disorders of the eye. Is the patient up to date with their annual eye exam?  Yes  Who is the provider or what is the name of the office in which the patient attends annual eye exams? [facility] If pt is not established with a provider, would they like to be referred to a provider to establish care? No .   Dental Screening: Recommended annual dental exams for proper oral hygiene  Community Resource Referral / Chronic Care Management: CRR required this visit?  No   CCM required this visit?  No      Plan:     I have personally reviewed and noted the following in the patients chart:   Medical and social history Use of alcohol, tobacco or illicit drugs  Current medications and supplements including opioid prescriptions. Patient is not currently taking opioid  prescriptions. Functional ability and status Nutritional status Physical activity Advanced directives List of other physicians Hospitalizations, surgeries, and ER visits in previous 12 months Vitals Screenings to include cognitive, depression, and falls Referrals and appointments  In addition, I have reviewed and discussed with patient certain preventive protocols, quality metrics, and best practice recommendations. A written personalized care plan for preventive services as well as general preventive health recommendations were provided to patient.     Sandrea Hammond, LPN   20/02/711   Nurse Notes: None

## 2021-05-20 ENCOUNTER — Other Ambulatory Visit: Payer: Self-pay | Admitting: Family Medicine

## 2021-05-20 DIAGNOSIS — R053 Chronic cough: Secondary | ICD-10-CM

## 2021-05-20 DIAGNOSIS — J410 Simple chronic bronchitis: Secondary | ICD-10-CM

## 2021-05-31 ENCOUNTER — Encounter: Payer: Self-pay | Admitting: Family Medicine

## 2021-05-31 ENCOUNTER — Ambulatory Visit (INDEPENDENT_AMBULATORY_CARE_PROVIDER_SITE_OTHER): Payer: Medicare Other | Admitting: Family Medicine

## 2021-05-31 ENCOUNTER — Ambulatory Visit (INDEPENDENT_AMBULATORY_CARE_PROVIDER_SITE_OTHER): Payer: Medicare Other

## 2021-05-31 VITALS — BP 139/70 | HR 128 | Temp 97.4°F | Ht 64.0 in | Wt 173.0 lb

## 2021-05-31 DIAGNOSIS — R202 Paresthesia of skin: Secondary | ICD-10-CM

## 2021-05-31 DIAGNOSIS — L309 Dermatitis, unspecified: Secondary | ICD-10-CM

## 2021-05-31 DIAGNOSIS — Z720 Tobacco use: Secondary | ICD-10-CM | POA: Diagnosis not present

## 2021-05-31 DIAGNOSIS — R200 Anesthesia of skin: Secondary | ICD-10-CM

## 2021-05-31 DIAGNOSIS — K529 Noninfective gastroenteritis and colitis, unspecified: Secondary | ICD-10-CM | POA: Diagnosis not present

## 2021-05-31 DIAGNOSIS — J209 Acute bronchitis, unspecified: Secondary | ICD-10-CM | POA: Diagnosis not present

## 2021-05-31 MED ORDER — METHYLPREDNISOLONE ACETATE 40 MG/ML IJ SUSP
40.0000 mg | Freq: Once | INTRAMUSCULAR | Status: AC
  Administered 2021-05-31: 15:00:00 40 mg via INTRAMUSCULAR

## 2021-05-31 MED ORDER — DOXYCYCLINE HYCLATE 100 MG PO TABS: 100.0000 mg | ORAL_TABLET | Freq: Two times a day (BID) | ORAL | 0 refills | Status: AC

## 2021-05-31 MED ORDER — PREDNISONE 20 MG PO TABS: ORAL_TABLET | ORAL | 0 refills | Status: DC

## 2021-05-31 MED ORDER — BENZONATATE 100 MG PO CAPS: 100.0000 mg | ORAL_CAPSULE | Freq: Three times a day (TID) | ORAL | 0 refills | Status: DC | PRN

## 2021-05-31 NOTE — Patient Instructions (Signed)
Start prednisone tomorrow Take doxycyline with FOOD!  Cervical Radiculopathy Cervical radiculopathy means that a nerve in the neck (a cervical nerve) is pinched or bruised. This can happen because of an injury to the cervical spine (vertebrae) in the neck, or as a normal part of getting older. This condition can cause pain or loss of feeling (numbness) that runs from your neck all the way down to your arm and fingers. Often, this condition gets better with rest. Treatment may be needed if the condition does not get better. What are the causes? A neck injury. A bulging disk in your spine. Sudden muscle tightening (muscle spasms). Tight muscles in your neck due to overuse. Arthritis. Breakdown in the bones and joints of the spine (spondylosis) due to getting older. Bone spurs that form near the nerves in the neck. What are the signs or symptoms? Pain. The pain may: Run from the neck to the arm and hand. Be very bad or irritating. Get worse when you move your neck. Loss of feeling or tingling in your arm or hand. Weakness in your arm or hand, in very bad cases. How is this treated? In many cases, treatment is not needed for this condition. With rest, the condition often gets better over time. If treatment is needed, options may include: Wearing a soft neck collar (cervical collar) for short periods of time. Doing exercises (physical therapy) to strengthen your neck muscles. Taking medicines. Having shots (injections) in your spine, in very bad cases. Having surgery. This may be needed if other treatments do not help. The type of surgery that is used will depend on the cause of your condition. Follow these instructions at home: If you have a soft neck collar: Wear it as told by your doctor. Take it off only as told by your doctor. Ask your doctor if you can take the collar off for cleaning and bathing. If you are allowed to take the collar off for cleaning or bathing: Follow instructions  from your doctor about how to take off the collar safely. Clean the collar by wiping it with mild soap and water and drying it completely. Take out any removable pads in the collar every 1-2 days. Wash them by hand with soap and water. Let them air-dry completely before you put them back in the collar. Check your skin under the collar for redness or sores. If you see any, tell your doctor. Managing pain   Take over-the-counter and prescription medicines only as told by your doctor. If told, put ice on the painful area. To do this: If you have a soft neck collar, take if off as told by your doctor. Put ice in a plastic bag. Place a towel between your skin and the bag. Leave the ice on for 20 minutes, 2-3 times a day. Take off the ice if your skin turns bright red. This is very important. If you cannot feel pain, heat, or cold, you have a greater risk of damage to the area. If using ice does not help, you can try using heat. Use the heat source that your doctor recommends, such as a moist heat pack or a heating pad. Place a towel between your skin and the heat source. Leave the heat on for 20-30 minutes. Take off the heat if your skin turns bright red. This is very important. If you cannot feel pain, heat, or cold, you have a greater risk of getting burned. You may try a gentle neck and shoulder rub (massage).  Activity Rest as needed. Return to your normal activities when your doctor says that it is safe. Do exercises as told by your doctor or physical therapist. You may have to avoid lifting. Ask your doctor how much you can safely lift. General instructions Use a flat pillow when you sleep. Do not drive while wearing a soft neck collar. If you do not have a soft neck collar, ask your doctor if it is safe to drive while your neck heals. Ask your doctor if you should avoid driving or using machines while you are taking your medicine. Do not smoke or use any products that contain nicotine or  tobacco. If you need help quitting, ask your doctor. Keep all follow-up visits. Contact a doctor if: Your condition does not get better with treatment. Get help right away if: Your pain gets worse and medicine does not help. You lose feeling or feel weak in your hand, arm, face, or leg. You have a high fever. Your neck is stiff. You cannot control when you poop or pee (have incontinence). You have trouble with walking, balance, or talking. Summary Cervical radiculopathy means that a nerve in the neck is pinched or bruised. A nerve can get pinched from a bulging disk, arthritis, an injury to the neck, or other causes. Symptoms include pain, tingling, or loss of feeling that goes from the neck to the arm or hand. Weakness in your arm or hand can happen in very bad cases. Treatment may include resting, wearing a soft neck collar, and doing exercises. You might need to take medicines for pain. In very bad cases, shots or surgery may be needed. This information is not intended to replace advice given to you by your health care provider. Make sure you discuss any questions you have with your health care provider. Document Revised: 10/27/2020 Document Reviewed: 10/27/2020 Elsevier Patient Education  [place].

## 2021-05-31 NOTE — Progress Notes (Signed)
Subjective: CC: URI PCP: [name], DO [name] is a 61 y.o. female presenting to clinic today for:  1.  URI Patient reports that she developed cough, wheezing, runny nose and congestion over the last couple of weeks.  Symptoms seem to be gradually getting worse.  She has been taking Tylenol, Robitussin, NyQuil with minimal relief.  She unfortunately has started having episodes of diarrhea again that are nonbloody but does note that the probiotics that we recommended in September have been helping with her chronic diarrhea.  She is an every day smoker.  Denies any hemoptysis, brown sputum.  2.  Right arm numbness and tingling Patient reports some intermittent numbness and tingling of the right upper extremity.  She has history of lymph node removal on the right axillary region previously and apparently required some type of vascular surgery in that right upper extremity a couple of years ago with Dr. Donnetta Hutching.  She has been having some soreness and throbbing in that right upper extremity.  Denies any overt neck pain but has been told that she has some mild degenerative changes in her neck previously.  3.  Eczema Additionally, she reports eczema of her face is responding wonderfully to the [place].  She is very pleased with this medication.  ROS: Per HPI  Allergies  Allergen Reactions   Vicodin [Hydrocodone-Acetaminophen] Nausea Only   Past Medical History:  Diagnosis Date   Allergy    Anxiety    Blood transfusion without reported diagnosis    Breast cancer ([place])    Breast cancer ([place]) 04/21/2012   Stage II (T1c N1 M0) grade 3 triple negative breast cancer, right- sided with 1 of 5 positive nodes, status post FEC in a dose dense fashion for 6 cycles followed by radiation therapy by Dr. Sondra Come for her high-risk disease with her initial date of surgery in February 2008.    Chemotherapy-induced neuropathy ([place]) 06/21/2016   COPD (chronic obstructive pulmonary disease)  (HCC)    Depression    Emphysema of lung (HCC)    GERD (gastroesophageal reflux disease) 12/18/2012   Headache    Hyperlipidemia    Neuropathy    Osteoporosis    PONV (postoperative nausea and vomiting)    PTSD (post-traumatic stress disorder)    Substance abuse (HCC)     Current Outpatient Medications:    albuterol (VENTOLIN HFA) 108 (90 Base) MCG/ACT inhaler, INHALE 2 PUFFS BY MOUTH EVERY 6 HOURS AS NEEDED FOR WHEEZING FOR SHORTNESS OF BREATH, Disp: 9 g, Rfl: 2   ARIPiprazole (ABILIFY) 5 MG tablet, Take 1.5 tablets (7.5 mg total) by mouth daily., Disp: 45 tablet, Rfl: 2   Chlorpheniramine-DM (COUGH & COLD HBP PO), Take by mouth as needed., Disp: , Rfl:    cholecalciferol (VITAMIN D) 1000 units tablet, Take 1,000 Units by mouth daily., Disp: , Rfl:    Crisaborole (EUCRISA) 2 % OINT, Apply once daily to face for eczema (has failed topical corticosteroids x3), Disp: 60 g, Rfl: PRN   diazepam (VALIUM) 10 MG tablet, Take 1 tablet (10 mg total) by mouth 3 (three) times daily., Disp: 90 tablet, Rfl: 2   dicyclomine (BENTYL) 10 MG capsule, Take 1 capsule by mouth twice daily, Disp: 180 capsule, Rfl: 0   DULoxetine (CYMBALTA) 60 MG capsule, Take 1 capsule (60 mg total) by mouth 2 (two) times daily., Disp: 180 capsule, Rfl: 2   fexofenadine (ALLEGRA) 180 MG tablet, Take 1 tablet (180 mg total) by mouth daily as needed for  allergies or rhinitis., Disp: 90 tablet, Rfl: 3   fluticasone (FLONASE) 50 MCG/ACT nasal spray, Place 1 spray into both nostrils 2 (two) times daily as needed for allergies or rhinitis., Disp: 16 g, Rfl: 6   fluticasone (FLOVENT HFA) 220 MCG/ACT inhaler, Inhale 2 puffs by mouth twice daily, Disp: 12 g, Rfl: 2   Loperamide HCl (ANTI-DIARRHEAL PO), Take by mouth as needed., Disp: , Rfl:    Multiple Vitamin (MULTIVITAMIN) capsule, Take 1 capsule by mouth daily., Disp: , Rfl:    omeprazole (PRILOSEC) 40 MG capsule, Take 1 capsule by mouth once daily, Disp: 90 capsule, Rfl: 0    pravastatin (PRAVACHOL) 20 MG tablet, Take 1 tablet (20 mg total) by mouth daily., Disp: 90 tablet, Rfl: 3   prazosin (MINIPRESS) 5 MG capsule, Take 1 capsule (5 mg total) by mouth at bedtime., Disp: 30 capsule, Rfl: 2   temazepam (RESTORIL) 30 MG capsule, Take 1 capsule (30 mg total) by mouth at bedtime as needed for sleep., Disp: 30 capsule, Rfl: 2   triamcinolone cream (KENALOG) 0.1 %, APPLY 1 APPLICATION TOPICALLY TWICE DAILY UNTIL RASH CLEARS (7 TO 10 DAYS), Disp: 30 g, Rfl: 0   vitamin C (ASCORBIC ACID) 500 MG tablet, Take 500 mg by mouth daily., Disp: , Rfl:  Social History   Socioeconomic History   Marital status: Divorced    Spouse name: Not on file   Number of children: 2   Years of education: 13   Highest education level: Not on file  Occupational History   Occupation: disabled    Comment: neuropathy from chemo  Tobacco Use   Smoking status: Every Day    Packs/day: 0.50    Years: 20.00    Pack years: 10.00    Types: Cigarettes    Start date: 05/07/1992   Smokeless tobacco: Never   Tobacco comments:    discussed  Substance and Sexual Activity   Alcohol use: Not Currently    Alcohol/week: 14.0 standard drinks    Types: 14 Cans of beer per week    Comment: nightly   Drug use: No   Sexual activity: Yes    Birth control/protection: Post-menopausal  Other Topics Concern   Not on file  Social History Narrative   Divorced   Lives alone   Spends time with boyfriend - he is a smoker   Investment banker, operational of Radio broadcast assistant Strain: Low Risk    Difficulty of Paying Living Expenses: Not hard at all  Food Insecurity: No Food Insecurity   Worried About Charity fundraiser in the Last Year: Never true   Arboriculturist in the Last Year: Never true  Transportation Needs: No Transportation Needs   Lack of Transportation (Medical): No   Lack of Transportation (Non-Medical): No  Physical Activity: Insufficiently Active   Days of Exercise per Week: 7 days    Minutes of Exercise per Session: 20 min  Stress: No Stress Concern Present   Feeling of Stress : Only a little  Social Connections: Moderately Isolated   Frequency of Communication with Friends and Family: More than three times a week   Frequency of Social Gatherings with Friends and Family: Twice a week   Attends Religious Services: Never   Marine scientist or Organizations: No   Attends Archivist Meetings: Never   Marital Status: Living with partner  Intimate Partner Violence: Not At Risk   Fear of Current or Ex-Partner: No   Emotionally  Abused: No   Physically Abused: No   Sexually Abused: No   Family History  Problem Relation Age of Onset   COPD Mother    Depression Mother    Heart disease Mother    Hyperlipidemia Mother    Hypertension Mother    Cancer Mother        lung cancer, to bone   Cancer Father        skin   Heart disease Father    Hypertension Father    Hyperlipidemia Father    Learning disabilities Father    Drug abuse Brother        overdose at 39   Drug abuse Daughter        drug overdose 72   Cancer Maternal Uncle        lung cancer   Colon cancer Neg Hx     Objective: Office vital signs reviewed. BP 139/70    Pulse (!) 128    Temp (!) 97.4 F (36.3 C)    Ht 5\' 4"  (1.626 m)    Wt 173 lb (78.5 kg)    LMP 07/09/2013    SpO2 96%    BMI 29.70 kg/m   Physical Examination:  General: Awake, alert, nontoxic female, No acute distress HEENT: Normal    Neck: No masses palpated. No lymphadenopathy    Ears: Tympanic membranes intact, normal light reflex, no erythema, no bulging    Eyes: PERRLA, extraocular membranes intact, sclera white    Nose: nasal turbinates moist, clear nasal discharge    Throat: moist mucus membranes, mild oropharyngeal erythema, no tonsillar exudate.  Airway is patent Cardio: regular rate and rhythm, S1S2 heard, no murmurs appreciated Pulm: Global expiratory and expiratory wheezes noted.  Normal work of breathing on  room air. MSK: No midline tenderness to palpation to the C-spine Extremities: Warm, well perfused.  +2 radial pulses.  She has excellent capillary refill  No results found.  Assessment/ Plan: 61 y.o. female   Bronchitis with bronchospasm - Plan: methylPREDNISolone acetate (DEPO-MEDROL) injection 40 mg, doxycycline (VIBRA-TABS) 100 MG tablet, predniSONE (DELTASONE) 20 MG tablet, benzonatate (TESSALON PERLES) 100 MG capsule  Tobacco use - Plan: methylPREDNISolone acetate (DEPO-MEDROL) injection 40 mg, doxycycline (VIBRA-TABS) 100 MG tablet, predniSONE (DELTASONE) 20 MG tablet  Numbness and tingling of right arm - Plan: DG Cervical Spine 2 or 3 views  Chronic diarrhea  Eczema of face  Given the fact that she is a tobacco user, ongoing empirically treat her as if she does have an underlying COPD.  I have given her dose of Depo-Medrol here in office and she will start oral prednisone burst tomorrow.  Doxycycline to start with supper.  Benzonatate sent for cough if needed.  Continue use albuterol inhaler as directed.  She understands red flag signs and symptoms warranting further evaluation  For the numbness and tingling in her right arm, I do question radiculopathy in the right upper extremity from her C-spine.  She had excellent capillary refill and pulses in that right upper extremity so I do not favor that this is a vascular issue.  Referral pending x-ray results.  I did review her x-ray results from 2019 which showed some mild degenerative changes at the lower C-spine.  Her chronic diarrhea is somewhat exacerbated currently due to acute illness but seems to have had an excellent response to probiotics.  Eczema face is responding well to the [place]  No orders of the defined types were placed in this encounter.  No orders  of the defined types were placed in this encounter.    [name], DO [place] [phone]

## 2021-06-02 ENCOUNTER — Other Ambulatory Visit: Payer: Self-pay | Admitting: Family Medicine

## 2021-06-02 DIAGNOSIS — M5412 Radiculopathy, cervical region: Secondary | ICD-10-CM

## 2021-06-28 ENCOUNTER — Other Ambulatory Visit: Payer: Self-pay

## 2021-06-28 ENCOUNTER — Telehealth (INDEPENDENT_AMBULATORY_CARE_PROVIDER_SITE_OTHER): Payer: Medicare Other | Admitting: Psychiatry

## 2021-06-28 ENCOUNTER — Encounter (HOSPITAL_COMMUNITY): Payer: Self-pay | Admitting: Psychiatry

## 2021-06-28 DIAGNOSIS — F332 Major depressive disorder, recurrent severe without psychotic features: Secondary | ICD-10-CM | POA: Diagnosis not present

## 2021-06-28 DIAGNOSIS — F4310 Post-traumatic stress disorder, unspecified: Secondary | ICD-10-CM | POA: Diagnosis not present

## 2021-06-28 DIAGNOSIS — F411 Generalized anxiety disorder: Secondary | ICD-10-CM | POA: Diagnosis not present

## 2021-06-28 MED ORDER — TEMAZEPAM 30 MG PO CAPS: 30.0000 mg | ORAL_CAPSULE | Freq: Every evening | ORAL | 2 refills | Status: DC | PRN

## 2021-06-28 MED ORDER — CITALOPRAM HYDROBROMIDE 20 MG PO TABS: 20.0000 mg | ORAL_TABLET | Freq: Every day | ORAL | 2 refills | Status: DC

## 2021-06-28 MED ORDER — DIAZEPAM 10 MG PO TABS: 10.0000 mg | ORAL_TABLET | Freq: Three times a day (TID) | ORAL | 2 refills | Status: DC

## 2021-06-28 MED ORDER — PRAZOSIN HCL 5 MG PO CAPS: 5.0000 mg | ORAL_CAPSULE | Freq: Every day | ORAL | 2 refills | Status: DC

## 2021-06-28 NOTE — Progress Notes (Signed)
Virtual Visit via Telephone Note  I connected with [name] on 06/28/21 at 11:40 AM EST by telephone and verified that I am speaking with the correct person using two identifiers.  Location: Patient: home Provider: office   I discussed the limitations, risks, security and privacy concerns of performing an evaluation and management service by telephone and the availability of in person appointments. I also discussed with the patient that there may be a patient responsible charge related to this service. The patient expressed understanding and agreed to proceed.      I discussed the assessment and treatment plan with the patient. The patient was provided an opportunity to ask questions and all were answered. The patient agreed with the plan and demonstrated an understanding of the instructions.   The patient was advised to call back or seek an in-person evaluation if the symptoms worsen or if the condition fails to improve as anticipated.  I provided 15 minutes of non-face-to-face time during this encounter.   [name], MD  [facility] MD/PA/NP OP Progress Note  06/28/2021 11:50 AM [name]  MRN:  [id number]  Chief Complaint:  Chief Complaint  Patient presents with   Depression   Anxiety   Follow-up   HPI: This patient is a 61 year old divorced white female who lives with her boyfriend in [place].  She has 1 grown son.  She has a daughter who died in 2011/07/29 of a narcotic overdose.  The patient is on disability.  The patient returns for follow-up after 2 months.  She states that the duloxetine and possibly the Abilify were making her very dizzy.  She has stopped both of them about 3 weeks ago.  She states that her mood is still somewhat up-and-down.  I am concerned about her being totally off an antidepressant as she tends to get depressed easily.  She states that she is willing to retry the Celexa.  She does not sleep totally through the night but generally can get back to  sleep if she wakes up.  She has added melatonin to her regimen.  The prazosin does continue to help nightmares and Valium continues to help her anxiety.  She denies thoughts of self-harm or suicidal ideation Visit Diagnosis:    ICD-10-CM   1. Major depressive disorder, recurrent, severe without psychotic features ([place])  F33.2     2. Generalized anxiety disorder  F41.1     3. PTSD (post-traumatic stress disorder)  F43.10       Past Psychiatric History: Past outpatient treatment  Past Medical History:  Past Medical History:  Diagnosis Date   Allergy    Anxiety    Blood transfusion without reported diagnosis    Breast cancer ([place])    Breast cancer ([place]) 04/21/2012   Stage II (T1c N1 M0) grade 3 triple negative breast cancer, right- sided with 1 of 5 positive nodes, status post FEC in a dose dense fashion for 6 cycles followed by radiation therapy by Dr. Sondra Come for her high-risk disease with her initial date of surgery in February 2008.    Chemotherapy-induced neuropathy ([place]) 06/21/2016   COPD (chronic obstructive pulmonary disease) (HCC)    Depression    Emphysema of lung (HCC)    GERD (gastroesophageal reflux disease) 12/18/2012   Headache    Hyperlipidemia    Neuropathy    Osteoporosis    PONV (postoperative nausea and vomiting)    PTSD (post-traumatic stress disorder)    Substance abuse ([place])  Past Surgical History:  Procedure Laterality Date   BIOPSY  11/05/2017   Procedure: BIOPSY;  Surgeon: Danie Binder, MD;  Location: [facility];  Service: Endoscopy;;  colon duodenum gastric   BREAST SURGERY     CATARACT EXTRACTION W/PHACO Left 08/15/2017   Procedure: CATARACT EXTRACTION WITH PHACOEMULSIFICATION  AND INTRAOCULAR LENS PLACEMENT LEFT EYE CDE=2.68;  Surgeon: Baruch Goldmann, MD;  Location: [facility] ORS;  Service: Ophthalmology;  Laterality: Left;  left   CATARACT EXTRACTION W/PHACO Right 09/06/2017   Procedure: CATARACT EXTRACTION PHACO AND INTRAOCULAR LENS PLACEMENT (IOC);   Surgeon: Baruch Goldmann, MD;  Location: [facility] ORS;  Service: Ophthalmology;  Laterality: Right;  CDE: 1.61   COLONOSCOPY WITH PROPOFOL N/A 11/05/2017    6 mm polyp in the sigmoid colon which was sessile and removed.  Rectosigmoid colon and sigmoid colon biopsies taken for evaluation for microscopic colitis.  Intermittent rectal bleeding due to sigmoid colon polyp and internal hemorrhoids. Normal colonic biopsies. Tubular adenoma. Repeat surveillance 5-10 years.    ESOPHAGOGASTRODUODENOSCOPY  03/10/2008   SLF: Normal esophagus without evidence of Barrett, mass, erosion  ulceration, or stricture, small bowel bx negative, gastritis with NO h.pylori   ESOPHAGOGASTRODUODENOSCOPY (EGD) WITH PROPOFOL N/A 11/05/2017   Mild gastritis and duodenitis.    leocolonoscopy  03/10/2008   VVO:HYWVPX terminal ileum, approximately 10 cm visualized/Normal colon without evidence of polyps, mass, inflammatory changes, diverticula, or AVMs/Normal retroflexed view of the rectum. random colon bx negative.   MASTECTOMY     bilateral, status post reconstruction.   POLYPECTOMY  11/05/2017   Procedure: POLYPECTOMY;  Surgeon: Danie Binder, MD;  Location[facility];  Service: Endoscopy;;  colon   TUBAL LIGATION      Family Psychiatric History: see below  Family History:  Family History  Problem Relation Age of Onset   COPD Mother    Depression Mother    Heart disease Mother    Hyperlipidemia Mother    Hypertension Mother    Cancer Mother        lung cancer, to bone   Cancer Father        skin   Heart disease Father    Hypertension Father    Hyperlipidemia Father    Learning disabilities Father    Drug abuse Brother        overdose at 78   Drug abuse Daughter        drug overdose 1   Cancer Maternal Uncle        lung cancer   Colon cancer Neg Hx     Social History:  Social History   Socioeconomic History   Marital status: Divorced    Spouse name: Not on file   Number of children: 2   Years of  education: 15   Highest education level: Not on file  Occupational History   Occupation: disabled    Comment: neuropathy from chemo  Tobacco Use   Smoking status: Every Day    Packs/day: 0.50    Years: 20.00    Pack years: 10.00    Types: Cigarettes    Start date: 05/07/1992   Smokeless tobacco: Never   Tobacco comments:    discussed  Substance and Sexual Activity   Alcohol use: Not Currently    Alcohol/week: 14.0 standard drinks    Types: 14 Cans of beer per week    Comment: nightly   Drug use: No   Sexual activity: Yes    Birth control/protection: Post-menopausal  Other Topics Concern  Not on file  Social History Narrative   Divorced   Lives alone   Spends time with boyfriend - he is a smoker   Scientist, physiological Strain: Low Risk    Difficulty of Paying Living Expenses: Not hard at all  Food Insecurity: No Food Insecurity   Worried About Charity fundraiser in the Last Year: Never true   Arboriculturist in the Last Year: Never true  Transportation Needs: No Transportation Needs   Lack of Transportation (Medical): No   Lack of Transportation (Non-Medical): No  Physical Activity: Insufficiently Active   Days of Exercise per Week: 7 days   Minutes of Exercise per Session: 20 min  Stress: No Stress Concern Present   Feeling of Stress : Only a little  Social Connections: Moderately Isolated   Frequency of Communication with Friends and Family: More than three times a week   Frequency of Social Gatherings with Friends and Family: Twice a week   Attends Religious Services: Never   Marine scientist or Organizations: No   Attends Archivist Meetings: Never   Marital Status: Living with partner    Allergies:  Allergies  Allergen Reactions   Vicodin [Hydrocodone-Acetaminophen] Nausea Only    Metabolic Disorder Labs: No results found for: HGBA1C, MPG No results found for: PROLACTIN Lab Results  Component Value Date    CHOL 172 12/18/2019   TRIG 110 12/18/2019   HDL 53 12/18/2019   CHOLHDL 3.2 12/18/2019   VLDL 42 (H) 11/23/2016   LDLCALC 99 12/18/2019   LDLCALC 103 (H) 07/08/2017   Lab Results  Component Value Date   TSH 3.110 01/17/2021   TSH 2.620 12/18/2019    Therapeutic Level Labs: No results found for: LITHIUM No results found for: VALPROATE No components found for:  CBMZ  Current Medications: Current Outpatient Medications  Medication Sig Dispense Refill   citalopram (CELEXA) 20 MG tablet Take 1 tablet (20 mg total) by mouth daily. 30 tablet 2   albuterol (VENTOLIN HFA) 108 (90 Base) MCG/ACT inhaler INHALE 2 PUFFS BY MOUTH EVERY 6 HOURS AS NEEDED FOR WHEEZING FOR SHORTNESS OF BREATH 9 g 2   benzonatate (TESSALON PERLES) 100 MG capsule Take 1 capsule (100 mg total) by mouth 3 (three) times daily as needed for cough. 20 capsule 0   Chlorpheniramine-DM (COUGH & COLD HBP PO) Take by mouth as needed.     cholecalciferol (VITAMIN D) 1000 units tablet Take 1,000 Units by mouth daily.     Crisaborole (EUCRISA) 2 % OINT Apply once daily to face for eczema (has failed topical corticosteroids x3) 60 g PRN   diazepam (VALIUM) 10 MG tablet Take 1 tablet (10 mg total) by mouth 3 (three) times daily. 90 tablet 2   dicyclomine (BENTYL) 10 MG capsule Take 1 capsule by mouth twice daily 180 capsule 0   fexofenadine (ALLEGRA) 180 MG tablet Take 1 tablet (180 mg total) by mouth daily as needed for allergies or rhinitis. 90 tablet 3   fluticasone (FLONASE) 50 MCG/ACT nasal spray Place 1 spray into both nostrils 2 (two) times daily as needed for allergies or rhinitis. 16 g 6   fluticasone (FLOVENT HFA) 220 MCG/ACT inhaler Inhale 2 puffs by mouth twice daily 12 g 2   Loperamide HCl (ANTI-DIARRHEAL PO) Take by mouth as needed.     Multiple Vitamin (MULTIVITAMIN) capsule Take 1 capsule by mouth daily.     omeprazole (PRILOSEC) 40 MG  capsule Take 1 capsule by mouth once daily 90 capsule 0   pravastatin  (PRAVACHOL) 20 MG tablet Take 1 tablet (20 mg total) by mouth daily. 90 tablet 3   prazosin (MINIPRESS) 5 MG capsule Take 1 capsule (5 mg total) by mouth at bedtime. 30 capsule 2   predniSONE (DELTASONE) 20 MG tablet 2 po at same time daily for 5 days 10 tablet 0   temazepam (RESTORIL) 30 MG capsule Take 1 capsule (30 mg total) by mouth at bedtime as needed for sleep. 30 capsule 2   triamcinolone cream (KENALOG) 0.1 % APPLY 1 APPLICATION TOPICALLY TWICE DAILY UNTIL RASH CLEARS (7 TO 10 DAYS) 30 g 0   vitamin C (ASCORBIC ACID) 500 MG tablet Take 500 mg by mouth daily.     No current facility-administered medications for this visit.     Musculoskeletal: Strength & Muscle Tone: na Gait & Station: na Patient leans: N/A  Psychiatric Specialty Exam: Review of Systems  Musculoskeletal:  Positive for arthralgias.  All other systems reviewed and are negative.  Last menstrual period 07/09/2013.There is no height or weight on file to calculate BMI.  General Appearance: NA  Eye Contact:  NA  Speech:  NA  Volume:  Normal  Mood:  Dysphoric  Affect:  NA  Thought Process:  Goal Directed  Orientation:  Full (Time, Place, and Person)  Thought Content: Rumination   Suicidal Thoughts:  No  Homicidal Thoughts:  No  Memory:  Immediate;   Good Recent;   Good Remote;   Good  Judgement:  Good  Insight:  Fair  Psychomotor Activity:  Decreased  Concentration:  Concentration: Good and Attention Span: Good  Recall:  Good  Fund of Knowledge: Good  Language: Good  Akathisia:  No  Handed:  Right  AIMS (if indicated): not done  Assets:  Communication Skills Desire for Improvement Resilience Social Support Talents/Skills  ADL's:  Intact  Cognition: WNL  Sleep:  Fair   Screenings: AUDIT    Flowsheet Row Clinical Support from 04/26/2021 in [place]  Alcohol Use Disorder Identification Test Final Score (AUDIT) 4      GAD-7    Flowsheet Row Office Visit from  05/31/2021 in [place] Office Visit from 01/17/2021 in [place] from 09/27/2020 in [facility] [place] [facility]  Total GAD-7 Score 19 17 19       PHQ2-9    Flowsheet Row Video Visit from 06/28/2021 in [place] Office Visit from 05/31/2021 in [place] from 04/26/2021 in [place] Video Visit from 04/19/2021 in [place] [facility] Video Visit from 01/18/2021 in [facility] [facility]  PHQ-2 Total Score 1 6 2 2  0  PHQ-9 Total Score -- 26 6 7 3       Flowsheet Row Video Visit from 06/28/2021 in [place] [facility] Video Visit from 04/19/2021 in [place] from 09/27/2020 in [place] No Risk No Risk Error: Q7 should not be populated when Q6 is No        Assessment and Plan: This patient is a 61 year old female with a history of depression anxiety and PTSD.  She is currently off all antidepressants and given her history I think she really needs to restart something.  We will restart Celexa 20 mg daily.  She will continue Restoril 30 mg at  bedtime for sleep, prazosin 5 mg at bedtime to prevent nightmares and Valium 10 mg 3 times daily for anxiety.  She will return to see me in 11-month  Collaboration of Care: Collaboration of Care: Primary Care Provider AEB chart notes are available to her PCP through the epic system  Patient/Guardian was advised Release of Information must be obtained prior to any record release in order to collaborate their care with an outside provider. Patient/Guardian was advised if they have not already done so to contact the registration department to sign all  necessary forms in order for [place] to release information regarding their care.   Consent: Patient/Guardian gives verbal consent for treatment and assignment of benefits for services provided during this visit. Patient/Guardian expressed understanding and agreed to proceed.    [name], MD 06/28/2021, 11:50 AM

## 2021-07-04 ENCOUNTER — Other Ambulatory Visit: Payer: Self-pay | Admitting: Family Medicine

## 2021-07-04 DIAGNOSIS — K219 Gastro-esophageal reflux disease without esophagitis: Secondary | ICD-10-CM

## 2021-07-13 IMAGING — MR MR BREAST BILAT WO/W CM
11 of 21 series · 17 of 48 positions shown · IV contrast (Gadavist)
Comparison: Bilateral breast MRI on 03/03/2008

CLINICAL DATA: Breast cancer screening. Greater than 20% lifetime
risk of breast cancer. History of bilateral mastectomy with implant
reconstruction. Patient reports RIGHT breast implant discomfort.

LABS:  None obtained at the time of imaging.
EXAM:
BILATERAL BREAST MRI WITH AND WITHOUT CONTRAST
TECHNIQUE: Multiplanar, multisequence MR images of both breasts were obtained
prior to and following the intravenous administration of 7 ml of
Gadavist

[Series 3: stir_ws_tra_silicone_bilateral · axial · 4.0mm · 0.94mm/px · 1 of 44 slices shown]
[im 1/44]
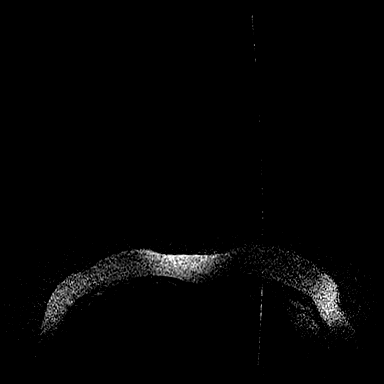

[Series 4: T2 · axial · 3.0mm · 1.02mm/px · 1 of 55 slices shown (1 of 5)]
[im 1/55]
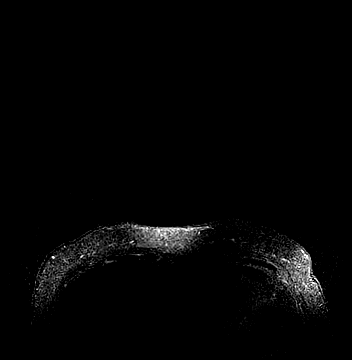

[Series 9: T1 dynamic · axial · 1.2mm · 0.94mm/px · z∈[-132,+97]mm · 3 of 189 slices shown (1 of 3)]
[im 1/189]
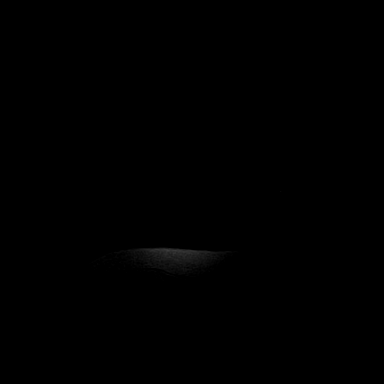
[im 95/189]
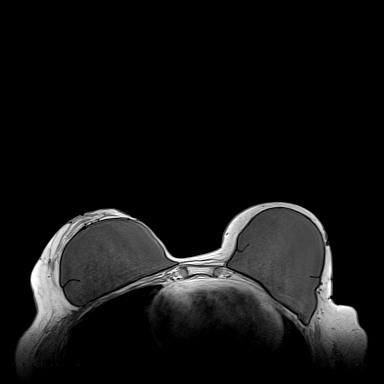
[im 189/189]
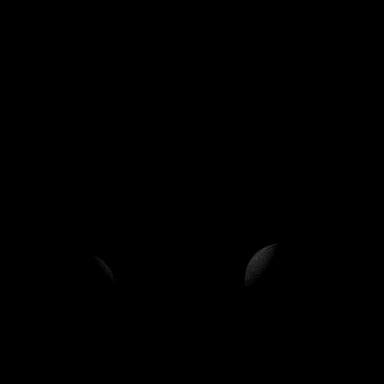

[Series 26: stir_ws_sag_silicone_left · sagittal · 4.0mm · 0.94mm/px · 1 of 34 slices shown]
[im 1/34]
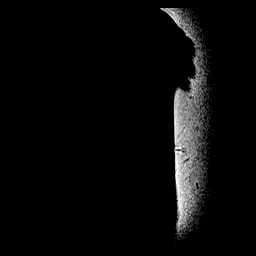

[Series 27: stir_ws_sag_silicone_right · sagittal · 4.0mm · 0.94mm/px · 1 of 36 slices shown]
[im 1/36]
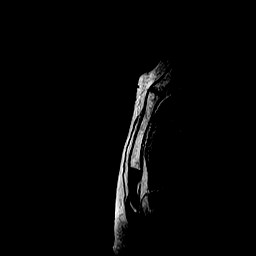

[Series 28: T2 · sagittal · 3.0mm · 0.89mm/px · 1 of 42 slices shown (2 of 5)]
[im 1/42]
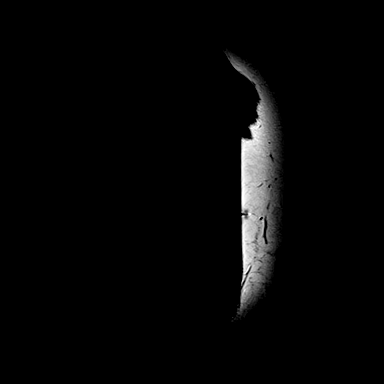

[Series 29: T2 · sagittal · 3.0mm · 0.89mm/px · 1 of 42 slices shown (3 of 5)]
[im 1/42]
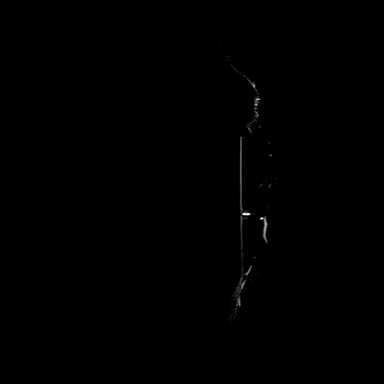

[Series 30: T2 · sagittal · 3.0mm · 0.89mm/px · 1 of 50 slices shown (4 of 5)]
[im 1/50]
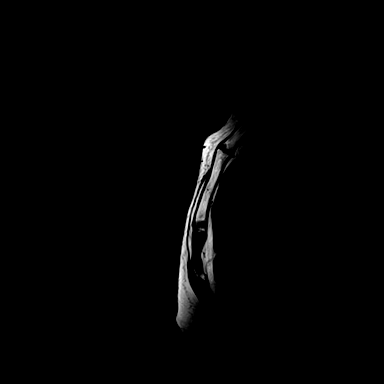

[Series 31: T2 · sagittal · 3.0mm · 0.89mm/px · 1 of 50 slices shown (5 of 5)]
[im 1/50]
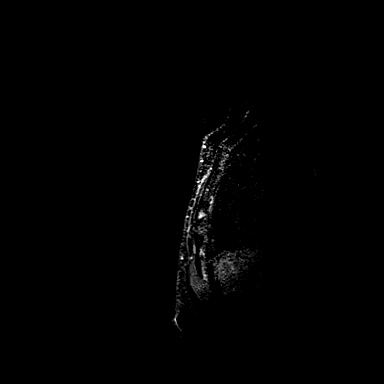

[Series 32: T1 dynamic · sagittal · 1.2mm · 0.94mm/px · 3 of 112 slices shown (2 of 3)]
[im 1/112]
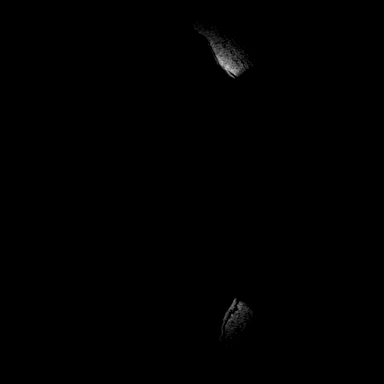
[im 56/112]
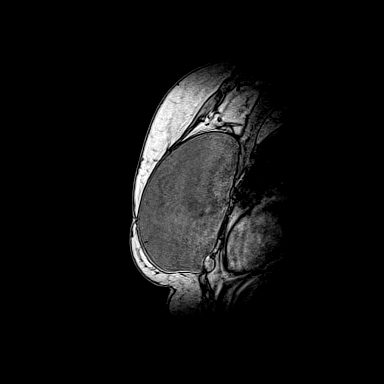
[im 112/112]
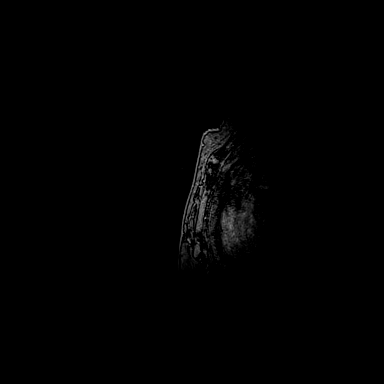

[Series 33: T1 dynamic · sagittal · 1.2mm · 0.94mm/px · 3 of 128 slices shown (3 of 3)]
[im 1/128]
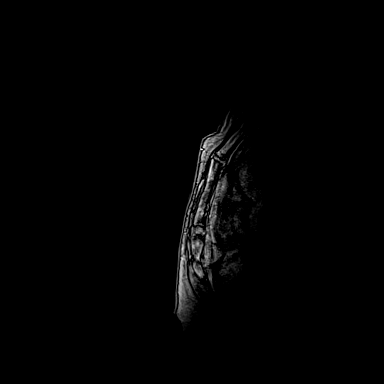
[im 64/128]
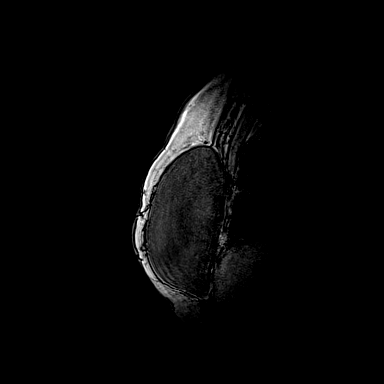
[im 128/128]
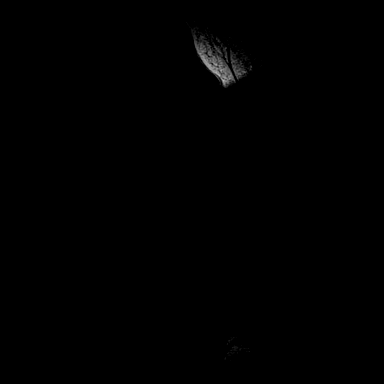

[17 of 48 positions shown; findings below may reference images not displayed]

Three-dimensional MR images were rendered by post-processing of the
original MR data on an independent workstation. The
three-dimensional MR images were interpreted, and findings are
reported in the following complete MRI report for this study. Three
dimensional images were evaluated at the independent [employer]
workstation
FINDINGS: Breast composition: a. Almost entirely fat.

Background parenchymal enhancement: Minimal

Right breast: Status post mastectomy. No mass or abnormal
enhancement. Patient has intact retropectoral silicone implant.

Left breast: Status post mastectomy. No mass or abnormal
enhancement. Patient has intact retropectoral silicone implant.

Lymph nodes: No abnormal appearing lymph nodes.

Ancillary findings:  None.
IMPRESSION: 1. No MRI evidence for malignancy in either breast.
2. Status post bilateral mastectomy. Bilateral retropectoral
silicone implants are intact.

RECOMMENDATION:
Clinical follow-up as needed.

Consider MRI as needed for additional surveillance.

BI-RADS CATEGORY  2: Benign.

## 2021-09-05 ENCOUNTER — Telehealth (INDEPENDENT_AMBULATORY_CARE_PROVIDER_SITE_OTHER): Payer: Medicare Other | Admitting: Psychiatry

## 2021-09-05 ENCOUNTER — Encounter (HOSPITAL_COMMUNITY): Payer: Self-pay | Admitting: Psychiatry

## 2021-09-05 DIAGNOSIS — F411 Generalized anxiety disorder: Secondary | ICD-10-CM

## 2021-09-05 DIAGNOSIS — F332 Major depressive disorder, recurrent severe without psychotic features: Secondary | ICD-10-CM

## 2021-09-05 DIAGNOSIS — F4310 Post-traumatic stress disorder, unspecified: Secondary | ICD-10-CM | POA: Diagnosis not present

## 2021-09-05 MED ORDER — PRAZOSIN HCL 5 MG PO CAPS: 5.0000 mg | ORAL_CAPSULE | Freq: Every day | ORAL | 2 refills | Status: DC

## 2021-09-05 MED ORDER — DIAZEPAM 10 MG PO TABS: 10.0000 mg | ORAL_TABLET | Freq: Three times a day (TID) | ORAL | 2 refills | Status: DC

## 2021-09-05 MED ORDER — CITALOPRAM HYDROBROMIDE 20 MG PO TABS: 20.0000 mg | ORAL_TABLET | Freq: Every day | ORAL | 2 refills | Status: DC

## 2021-09-05 MED ORDER — TEMAZEPAM 30 MG PO CAPS: 30.0000 mg | ORAL_CAPSULE | Freq: Every evening | ORAL | 2 refills | Status: DC | PRN

## 2021-09-05 NOTE — Progress Notes (Signed)
Virtual Visit via Telephone Note ? ?I connected with [name] on 09/05/21 at  2:40 PM EDT by telephone and verified that I am speaking with the correct person using two identifiers. ? ?Location: ?Patient: home ?Provider: office ?  ?I discussed the limitations, risks, security and privacy concerns of performing an evaluation and management service by telephone and the availability of in person appointments. I also discussed with the patient that there may be a patient responsible charge related to this service. The patient expressed understanding and agreed to proceed. ? ? ? ?  ?I discussed the assessment and treatment plan with the patient. The patient was provided an opportunity to ask questions and all were answered. The patient agreed with the plan and demonstrated an understanding of the instructions. ?  ?The patient was advised to call back or seek an in-person evaluation if the symptoms worsen or if the condition fails to improve as anticipated. ? ?I provided 15 minutes of non-face-to-face time during this encounter. ? ? ?Judy Spiller, MD ? ?[place] MD/PA/NP OP Progress Note ? ?09/05/2021 3:11 PM ?[name]  ?MRN:  [id number] ? ?Chief Complaint:  ?Chief Complaint  ?Patient presents with  ? Depression  ? Anxiety  ? Follow-up  ? ?HPI: This patient is a 61 year old divorced white female who lives with her boyfriend in [place].  She has 1 grown son.  She has a daughter who died in 08/18/11 of a narcotic overdose.  The patient is on disability ? ?The patient returns for follow-up after 3 months.  She states that she is doing better with the addition of Celexa.  She had stopped the duloxetine and Abilify because she thought they were making her dizzy and for a while she was on no antidepressant and she seemed to be getting more depressed.  She states that she has made a decision to leave her boyfriend and live with her son in [place].  She just has to figure out the right time.  She has an elderly dog that  cannot move to [facility] which is causing a conflict for her.  She states that she and her boyfriend have nothing in common.  She does not have anything against him and he is not abusive but they really do not have much of a relationship anymore.  She wants to live in an area where there is more going on and she can use public transportation.  She seems to be less depressed although she still sometimes has fleeting suicidal thoughts she claims she would never act on them.  She is sleeping well although still has intense dreams but no nightmares. ?Visit Diagnosis:  ?  ICD-10-CM   ?1. Major depressive disorder, recurrent, severe without psychotic features ([place])  F33.2   ?  ?2. Generalized anxiety disorder  F41.1   ?  ?3. PTSD (post-traumatic stress disorder)  F43.10   ?  ? ? ?Past Psychiatric History: Past outpatient treatment ? ?Past Medical History:  ?Past Medical History:  ?Diagnosis Date  ? Allergy   ? Anxiety   ? Blood transfusion without reported diagnosis   ? Breast cancer ([place])   ? Breast cancer ([place]) 04/21/2012  ? Stage II (T1c N1 M0) grade 3 triple negative breast cancer, right- sided with 1 of 5 positive nodes, status post FEC in a dose dense fashion for 6 cycles followed by radiation therapy by Dr. Sondra Come for her high-risk disease with her initial date of surgery in February 2008.   ? Chemotherapy-induced  neuropathy ([place]) 06/21/2016  ? COPD (chronic obstructive pulmonary disease) ([place])   ? Depression   ? Emphysema of lung ([place])   ? GERD (gastroesophageal reflux disease) 12/18/2012  ? Headache   ? Hyperlipidemia   ? Neuropathy   ? Osteoporosis   ? PONV (postoperative nausea and vomiting)   ? PTSD (post-traumatic stress disorder)   ? Substance abuse ([place])   ?  ?Past Surgical History:  ?Procedure Laterality Date  ? BIOPSY  11/05/2017  ? Procedure: BIOPSY;  Surgeon: Danie Binder, MD;  [facility];  Service: Endoscopy;;  colon ?duodenum ?gastric  ? BREAST SURGERY    ? CATARACT EXTRACTION W/PHACO  Left 08/15/2017  ? Procedure: CATARACT EXTRACTION WITH PHACOEMULSIFICATION  AND INTRAOCULAR LENS PLACEMENT LEFT EYE CDE=2.68;  Surgeon: Baruch Goldmann, MD;  Location: [facility];  Service: Ophthalmology;  Laterality: Left;  left  ? CATARACT EXTRACTION W/PHACO Right 09/06/2017  ? Procedure: CATARACT EXTRACTION PHACO AND INTRAOCULAR LENS PLACEMENT (IOC);  Surgeon: Baruch Goldmann, MD;  Location: [facility];  Service: Ophthalmology;  Laterality: Right;  CDE: 1.61  ? COLONOSCOPY WITH PROPOFOL N/A 11/05/2017  ?  6 mm polyp in the sigmoid colon which was sessile and removed.  Rectosigmoid colon and sigmoid colon biopsies taken for evaluation for microscopic colitis.  Intermittent rectal bleeding due to sigmoid colon polyp and internal hemorrhoids. Normal colonic biopsies. Tubular adenoma. Repeat surveillance 5-10 years.   ? ESOPHAGOGASTRODUODENOSCOPY  03/10/2008  ? SLF: Normal esophagus without evidence of Barrett, mass, erosion  ulceration, or stricture, small bowel bx negative, gastritis with NO h.pylori  ? ESOPHAGOGASTRODUODENOSCOPY (EGD) WITH PROPOFOL N/A 11/05/2017  ? Mild gastritis and duodenitis.   ? leocolonoscopy  03/10/2008  ? LPF:XTKWIO terminal ileum, approximately 10 cm visualized/Normal colon without evidence of polyps, mass, inflammatory changes, diverticula, or AVMs/Normal retroflexed view of the rectum. random colon bx negative.  ? MASTECTOMY    ? bilateral, status post reconstruction.  ? POLYPECTOMY  11/05/2017  ? Procedure: POLYPECTOMY;  Surgeon: Danie Binder, MD;  [facility];  Service: Endoscopy;;  colon  ? TUBAL LIGATION    ? ? ?Family Psychiatric History: See below ? ?Family History:  ?Family History  ?Problem Relation Age of Onset  ? COPD Mother   ? Depression Mother   ? Heart disease Mother   ? Hyperlipidemia Mother   ? Hypertension Mother   ? Cancer Mother   ?     lung cancer, to bone  ? Cancer Father   ?     skin  ? Heart disease Father   ? Hypertension Father   ? Hyperlipidemia Father   ? Learning  disabilities Father   ? Drug abuse Brother   ?     overdose at 51  ? Drug abuse Daughter   ?     drug overdose 48  ? Cancer Maternal Uncle   ?     lung cancer  ? Colon cancer Neg Hx   ? ? ?Social History:  ?Social History  ? ?Socioeconomic History  ? Marital status: Divorced  ?  Spouse name: Not on file  ? Number of children: 2  ? Years of education: 52  ? Highest education level: Not on file  ?Occupational History  ? Occupation: disabled  ?  Comment: neuropathy from chemo  ?Tobacco Use  ? Smoking status: Every Day  ?  Packs/day: 0.50  ?  Years: 20.00  ?  Pack years: 10.00  ?  Types: Cigarettes  ?  Start date: 05/07/1992  ? Smokeless tobacco: Never  ? Tobacco comments:  ?  discussed  ?Substance and Sexual Activity  ? Alcohol use: Not Currently  ?  Alcohol/week: 14.0 standard drinks  ?  Types: 14 Cans of beer per week  ?  Comment: nightly  ? Drug use: No  ? Sexual activity: Yes  ?  Birth control/protection: Post-menopausal  ?Other Topics Concern  ? Not on file  ?Social History Narrative  ? Divorced  ? Lives alone  ? Spends time with boyfriend - he is a smoker  ? ?Social Determinants of Health  ? ?Financial Resource Strain: Low Risk   ? Difficulty of Paying Living Expenses: Not hard at all  ?Food Insecurity: No Food Insecurity  ? Worried About Charity fundraiser in the Last Year: Never true  ? Ran Out of Food in the Last Year: Never true  ?Transportation Needs: No Transportation Needs  ? Lack of Transportation (Medical): No  ? Lack of Transportation (Non-Medical): No  ?Physical Activity: Insufficiently Active  ? Days of Exercise per Week: 7 days  ? Minutes of Exercise per Session: 20 min  ?Stress: No Stress Concern Present  ? Feeling of Stress : Only a little  ?Social Connections: Moderately Isolated  ? Frequency of Communication with Friends and Family: More than three times a week  ? Frequency of Social Gatherings with Friends and Family: Twice a week  ? Attends Religious Services: Never  ? Active Member of Clubs or  Organizations: No  ? Attends Archivist Meetings: Never  ? Marital Status: Living with partner  ? ? ?Allergies:  ?Allergies  ?Allergen Reactions  ? Vicodin [Hydrocodone-Acetaminophen] Nausea Onl

## 2021-12-06 ENCOUNTER — Telehealth (INDEPENDENT_AMBULATORY_CARE_PROVIDER_SITE_OTHER): Payer: Medicare Other | Admitting: Psychiatry

## 2021-12-06 ENCOUNTER — Encounter (HOSPITAL_COMMUNITY): Payer: Self-pay | Admitting: Psychiatry

## 2021-12-06 DIAGNOSIS — F332 Major depressive disorder, recurrent severe without psychotic features: Secondary | ICD-10-CM

## 2021-12-06 DIAGNOSIS — F4310 Post-traumatic stress disorder, unspecified: Secondary | ICD-10-CM

## 2021-12-06 DIAGNOSIS — F411 Generalized anxiety disorder: Secondary | ICD-10-CM

## 2021-12-06 MED ORDER — DIAZEPAM 10 MG PO TABS
10.0000 mg | ORAL_TABLET | Freq: Three times a day (TID) | ORAL | 2 refills | Status: DC
Start: 2021-12-06 — End: 2022-03-08

## 2021-12-06 MED ORDER — PRAZOSIN HCL 5 MG PO CAPS
5.0000 mg | ORAL_CAPSULE | Freq: Every day | ORAL | 2 refills | Status: DC
Start: 2021-12-06 — End: 2022-03-08

## 2021-12-06 MED ORDER — CITALOPRAM HYDROBROMIDE 20 MG PO TABS: 20.0000 mg | ORAL_TABLET | Freq: Every day | ORAL | 2 refills | Status: DC

## 2021-12-06 MED ORDER — TEMAZEPAM 30 MG PO CAPS: 30.0000 mg | ORAL_CAPSULE | Freq: Every evening | ORAL | 2 refills | Status: DC | PRN

## 2021-12-06 NOTE — Progress Notes (Deleted)
[place] MD/PA/NP OP Progress Note  12/06/2021 11:52 AM [name]  MRN:  [id number]  Chief Complaint:  Chief Complaint  Patient presents with   Anxiety   Depression   Follow-up   HPI: *** Visit Diagnosis:    ICD-10-CM   1. Major depressive disorder, recurrent, severe without psychotic features ([place])  F33.2     2. Generalized anxiety disorder  F41.1     3. PTSD (post-traumatic stress disorder)  F43.10       Past Psychiatric History: ***  Past Medical History:  Past Medical History:  Diagnosis Date   Allergy    Anxiety    Blood transfusion without reported diagnosis    Breast cancer ([place])    Breast cancer ([place]) 04/21/2012   Stage II (T1c N1 M0) grade 3 triple negative breast cancer, right- sided with 1 of 5 positive nodes, status post FEC in a dose dense fashion for 6 cycles followed by radiation therapy by Dr. Sondra Come for her high-risk disease with her initial date of surgery in February 2008.    Chemotherapy-induced neuropathy ([place]) 06/21/2016   COPD (chronic obstructive pulmonary disease) ([facility])    Depression    Emphysema of lung ([facility])    GERD (gastroesophageal reflux disease) 12/18/2012   Headache    Hyperlipidemia    Neuropathy    Osteoporosis    PONV (postoperative nausea and vomiting)    PTSD (post-traumatic stress disorder)    Substance abuse [facility])     Past Surgical History:  Procedure Laterality Date   BIOPSY  11/05/2017   Procedure: BIOPSY;  Surgeon: Danie Binder, MD;  [facility];  Service: Endoscopy;;  colon duodenum gastric   BREAST SURGERY     CATARACT EXTRACTION W/PHACO Left 08/15/2017   Procedure: CATARACT EXTRACTION WITH PHACOEMULSIFICATION  AND INTRAOCULAR LENS PLACEMENT LEFT EYE CDE=2.68;  Surgeon: Baruch Goldmann, MD;  Location: [facility];  Service: Ophthalmology;  Laterality: Left;  left   CATARACT EXTRACTION W/PHACO Right 09/06/2017   Procedure: CATARACT EXTRACTION PHACO AND INTRAOCULAR LENS PLACEMENT (IOC);  Surgeon: Baruch Goldmann, MD;   Location: [facility];  Service: Ophthalmology;  Laterality: Right;  CDE: 1.61   COLONOSCOPY WITH PROPOFOL N/A 11/05/2017    6 mm polyp in the sigmoid colon which was sessile and removed.  Rectosigmoid colon and sigmoid colon biopsies taken for evaluation for microscopic colitis.  Intermittent rectal bleeding due to sigmoid colon polyp and internal hemorrhoids. Normal colonic biopsies. Tubular adenoma. Repeat surveillance 5-10 years.    ESOPHAGOGASTRODUODENOSCOPY  03/10/2008   SLF: Normal esophagus without evidence of Barrett, mass, erosion  ulceration, or stricture, small bowel bx negative, gastritis with NO h.pylori   ESOPHAGOGASTRODUODENOSCOPY (EGD) WITH PROPOFOL N/A 11/05/2017   Mild gastritis and duodenitis.    leocolonoscopy  03/10/2008   QVZ:DGLOVF terminal ileum, approximately 10 cm visualized/Normal colon without evidence of polyps, mass, inflammatory changes, diverticula, or AVMs/Normal retroflexed view of the rectum. random colon bx negative.   MASTECTOMY     bilateral, status post reconstruction.   POLYPECTOMY  11/05/2017   Procedure: POLYPECTOMY;  Surgeon: Danie Binder, MD;  [facility];  Service: Endoscopy;;  colon   TUBAL LIGATION      Family Psychiatric History: ***  Family History:  Family History  Problem Relation Age of Onset   COPD Mother    Depression Mother    Heart disease Mother    Hyperlipidemia Mother    Hypertension Mother    Cancer Mother  lung cancer, to bone   Cancer Father        skin   Heart disease Father    Hypertension Father    Hyperlipidemia Father    Learning disabilities Father    Drug abuse Brother        overdose at 38   Drug abuse Daughter        drug overdose 34   Cancer Maternal Uncle        lung cancer   Colon cancer Neg Hx     Social History:  Social History   Socioeconomic History   Marital status: Divorced    Spouse name: Not on file   Number of children: 2   Years of education: 13   Highest education level:  Not on file  Occupational History   Occupation: disabled    Comment: neuropathy from chemo  Tobacco Use   Smoking status: Every Day    Packs/day: 0.50    Years: 20.00    Total pack years: 10.00    Types: Cigarettes    Start date: 05/07/1992   Smokeless tobacco: Never   Tobacco comments:    discussed  Substance and Sexual Activity   Alcohol use: Not Currently    Alcohol/week: 14.0 standard drinks of alcohol    Types: 14 Cans of beer per week    Comment: nightly   Drug use: No   Sexual activity: Yes    Birth control/protection: Post-menopausal  Other Topics Concern   Not on file  Social History Narrative   Divorced   Lives alone   Spends time with boyfriend - he is a smoker   Social Determinants of Radio broadcast assistant Strain: Low Risk  (04/26/2021)   Overall Financial Resource Strain (CARDIA)    Difficulty of Paying Living Expenses: Not hard at all  Food Insecurity: No Food Insecurity (04/26/2021)   Hunger Vital Sign    Worried About Running Out of Food in the Last Year: Never true    Ran Out of Food in the Last Year: Never true  Transportation Needs: No Transportation Needs (04/26/2021)   PRAPARE - Hydrologist (Medical): No    Lack of Transportation (Non-Medical): No  Physical Activity: Insufficiently Active (04/26/2021)   Exercise Vital Sign    Days of Exercise per Week: 7 days    Minutes of Exercise per Session: 20 min  Stress: No Stress Concern Present (04/26/2021)   [place]    Feeling of Stress : Only a little  Social Connections: Moderately Isolated (04/26/2021)   Social Connection and Isolation Panel [NHANES]    Frequency of Communication with Friends and Family: More than three times a week    Frequency of Social Gatherings with Friends and Family: Twice a week    Attends Religious Services: Never    Marine scientist or Organizations: No    Attends  Archivist Meetings: Never    Marital Status: Living with partner    Allergies:  Allergies  Allergen Reactions   Vicodin [Hydrocodone-Acetaminophen] Nausea Only    Metabolic Disorder Labs: No results found for: "HGBA1C", "MPG" No results found for: "PROLACTIN" Lab Results  Component Value Date   CHOL 172 12/18/2019   TRIG 110 12/18/2019   HDL 53 12/18/2019   CHOLHDL 3.2 12/18/2019   VLDL 42 (H) 11/23/2016   LDLCALC 99 12/18/2019   LDLCALC 103 (H) 07/08/2017   Lab Results  Component Value Date   TSH 3.110 01/17/2021   TSH 2.620 12/18/2019    Therapeutic Level Labs: No results found for: "LITHIUM" No results found for: "VALPROATE" No results found for: "CBMZ"  Current Medications: Current Outpatient Medications  Medication Sig Dispense Refill   albuterol (VENTOLIN HFA) 108 (90 Base) MCG/ACT inhaler INHALE 2 PUFFS BY MOUTH EVERY 6 HOURS AS NEEDED FOR WHEEZING FOR SHORTNESS OF BREATH 9 g 2   benzonatate (TESSALON PERLES) 100 MG capsule Take 1 capsule (100 mg total) by mouth 3 (three) times daily as needed for cough. 20 capsule 0   Chlorpheniramine-DM (COUGH & COLD HBP PO) Take by mouth as needed.     cholecalciferol (VITAMIN D) 1000 units tablet Take 1,000 Units by mouth daily.     citalopram (CELEXA) 20 MG tablet Take 1 tablet (20 mg total) by mouth daily. 30 tablet 2   Crisaborole (EUCRISA) 2 % OINT Apply once daily to face for eczema (has failed topical corticosteroids x3) 60 g PRN   diazepam (VALIUM) 10 MG tablet Take 1 tablet (10 mg total) by mouth 3 (three) times daily. 90 tablet 2   dicyclomine (BENTYL) 10 MG capsule Take 1 capsule by mouth twice daily 180 capsule 0   fexofenadine (ALLEGRA) 180 MG tablet Take 1 tablet (180 mg total) by mouth daily as needed for allergies or rhinitis. 90 tablet 3   fluticasone (FLONASE) 50 MCG/ACT nasal spray Place 1 spray into both nostrils 2 (two) times daily as needed for allergies or rhinitis. 16 g 6   fluticasone  (FLOVENT HFA) 220 MCG/ACT inhaler Inhale 2 puffs by mouth twice daily 12 g 2   Loperamide HCl (ANTI-DIARRHEAL PO) Take by mouth as needed.     Multiple Vitamin (MULTIVITAMIN) capsule Take 1 capsule by mouth daily.     omeprazole (PRILOSEC) 40 MG capsule Take 1 capsule by mouth once daily 90 capsule 3   pravastatin (PRAVACHOL) 20 MG tablet Take 1 tablet (20 mg total) by mouth daily. 90 tablet 3   prazosin (MINIPRESS) 5 MG capsule Take 1 capsule (5 mg total) by mouth at bedtime. 30 capsule 2   temazepam (RESTORIL) 30 MG capsule Take 1 capsule (30 mg total) by mouth at bedtime as needed for sleep. 30 capsule 2   triamcinolone cream (KENALOG) 0.1 % APPLY 1 APPLICATION TOPICALLY TWICE DAILY UNTIL RASH CLEARS (7 TO 10 DAYS) 30 g 0   vitamin C (ASCORBIC ACID) 500 MG tablet Take 500 mg by mouth daily.     No current facility-administered medications for this visit.     Musculoskeletal: Strength & Muscle Tone: {desc; muscle tone:32375} Gait & Station: {PE GAIT ED ZHYQ:65784} Patient leans: {Patient Leans:21022755}  Psychiatric Specialty Exam: Review of Systems  Last menstrual period 07/09/2013.There is no height or weight on file to calculate BMI.  General Appearance: {Appearance:22683}  Eye Contact:  {BHH EYE CONTACT:22684}  Speech:  {Speech:22685}  Volume:  {Volume (PAA):22686}  Mood:  {BHH MOOD:22306}  Affect:  {Affect (PAA):22687}  Thought Process:  {Thought Process (PAA):22688}  Orientation:  {BHH ORIENTATION (PAA):22689}  Thought Content: {Thought Content:22690}   Suicidal Thoughts:  {ST/HT (PAA):22692}  Homicidal Thoughts:  {ST/HT (PAA):22692}  Memory:  {BHH MEMORY:22881}  Judgement:  {Judgement (PAA):22694}  Insight:  {Insight (PAA):22695}  Psychomotor Activity:  {Psychomotor (PAA):22696}  Concentration:  {Concentration:21399}  Recall:  {BHH GOOD/FAIR/POOR:22877}  Fund of Knowledge: {BHH GOOD/FAIR/POOR:22877}  Language: {BHH GOOD/FAIR/POOR:22877}  Akathisia:  {BHH YES OR  NO:22294}  Handed:  {Handed:22697}  AIMS (if  indicated): {Desc; done/not:10129}  Assets:  {Assets (PAA):22698}  ADL's:  {BHH INO'M:76720}  Cognition: {chl bhh cognition:304700322}  Sleep:  {BHH GOOD/FAIR/POOR:22877}   Screenings: AUDIT    Flowsheet Row Clinical Support from 04/26/2021 in [facility]  Alcohol Use Disorder Identification Test Final Score (AUDIT) 4      GAD-7    Flowsheet Row Office Visit from 05/31/2021 in [facility] Visit from 01/17/2021 in [place] from 09/27/2020 in [facility]  Total GAD-7 Score '19 17 19      '$ PHQ2-9    Flowsheet Row Video Visit from 12/06/2021 in [facility] [facility] Video Visit from 09/05/2021 in [place] [facility] Video Visit from 06/28/2021 in [place] Office Visit from 05/31/2021 in [place] from 04/26/2021 in [facility]  PHQ-2 Total Score 0 0 '1 6 2  '$ PHQ-9 Total Score -- -- -- 26 6      Flowsheet Row Video Visit from 12/06/2021 in [place] [facility] Video Visit from 09/05/2021 in [place] [facility] Video Visit from 06/28/2021 in [facility] No Risk No Risk No Risk        Assessment and Plan: ***  Collaboration of Care: Collaboration of Care: {BH OP Collaboration of Care:21014065}  Patient/Guardian was advised Release of Information must be obtained prior to any record release in order to collaborate their care with an outside provider. Patient/Guardian was advised if they have not already done so to contact the registration department to sign all necessary forms in order for [place] to release information  regarding their care.   Consent: Patient/Guardian gives verbal consent for treatment and assignment of benefits for services provided during this visit. Patient/Guardian expressed understanding and agreed to proceed.    [name], MD 12/06/2021, 11:52 AM

## 2021-12-06 NOTE — Progress Notes (Signed)
Virtual Visit via Telephone Note  I connected with [name] on 12/06/21 at 11:40 AM EDT by telephone and verified that I am speaking with the correct person using two identifiers.  Location: Patient: home Provider: office   I discussed the limitations, risks, security and privacy concerns of performing an evaluation and management service by telephone and the availability of in person appointments. I also discussed with the patient that there may be a patient responsible charge related to this service. The patient expressed understanding and agreed to proceed.       I discussed the assessment and treatment plan with the patient. The patient was provided an opportunity to ask questions and all were answered. The patient agreed with the plan and demonstrated an understanding of the instructions.   The patient was advised to call back or seek an in-person evaluation if the symptoms worsen or if the condition fails to improve as anticipated.  I provided 17 minutes of non-face-to-face time during this encounter.   [name], MD  [facility] MD/PA/NP OP Progress Note  12/06/2021 11:53 AM [name]  MRN:  [id number]  Chief Complaint:  Chief Complaint  Patient presents with   Anxiety   Depression   Follow-up   HPI: This patient is a 61 year old divorced white female who [place]  She has 1 grown son.  She has a daughter who died in August 09, 2011 of a narcotic overdose.  The patient is on disability  The patient returns for follow-up after 3 months.  She states that she has been doing better.  She and her son have been traveling to [place] fairly frequently to see her son's baby.  The baby's mother took him there to be with her parents last year and never came back.  They are fighting over visitation.  She is trying to be supportive towards her son.  This is keeping her busy and active.  He is also given her a vehicle and now she has more freedom.  She is no  longer thinking of moving out leaving her boyfriend.  Overall her mood has been stable.  She is sleeping fairly well and not having nightmares.  She complains of diarrhea but I do not think it is related to any of the psychiatric medicines that she has been on them for quite some time.  She denies thoughts of self-harm or suicidal ideation Visit Diagnosis:    ICD-10-CM   1. Major depressive disorder, recurrent, severe without psychotic features ([place])  F33.2     2. Generalized anxiety disorder  F41.1     3. PTSD (post-traumatic stress disorder)  F43.10       Past Psychiatric History: Past outpatient treatment  Past Medical History:  Past Medical History:  Diagnosis Date   Allergy    Anxiety    Blood transfusion without reported diagnosis    Breast cancer ([place])    Breast cancer ([place]) 04/21/2012   Stage II (T1c N1 M0) grade 3 triple negative breast cancer, right- sided with 1 of 5 positive nodes, status post FEC in a dose dense fashion for 6 cycles followed by radiation therapy by Dr. Sondra Come for her high-risk disease with her initial date of surgery in February 2008.    Chemotherapy-induced neuropathy ([place]) 06/21/2016   COPD (chronic obstructive pulmonary disease) (HCC)    Depression    Emphysema of lung (HCC)    GERD (gastroesophageal reflux disease) 12/18/2012   Headache    Hyperlipidemia  Neuropathy    Osteoporosis    PONV (postoperative nausea and vomiting)    PTSD (post-traumatic stress disorder)    Substance abuse [facility])     Past Surgical History:  Procedure Laterality Date   BIOPSY  11/05/2017   Procedure: BIOPSY;  Surgeon: Danie Binder, MD;  Location: [facility];  Service: Endoscopy;;  colon duodenum gastric   BREAST SURGERY     CATARACT EXTRACTION W/PHACO Left 08/15/2017   Procedure: CATARACT EXTRACTION WITH PHACOEMULSIFICATION  AND INTRAOCULAR LENS PLACEMENT LEFT EYE CDE=2.68;  Surgeon: Baruch Goldmann, MD;  Location: [facility] ORS;  Service: Ophthalmology;  Laterality:  Left;  left   CATARACT EXTRACTION W/PHACO Right 09/06/2017   Procedure: CATARACT EXTRACTION PHACO AND INTRAOCULAR LENS PLACEMENT (IOC);  Surgeon: Baruch Goldmann, MD;  Location: [facility] OR[facility];  Service: Ophthalmology;  Laterality: Right;  CDE: 1.61   COLONOSCOPY WITH PROPOFOL N/A 11/05/2017    6 mm polyp in the sigmoid colon which was sessile and removed.  Rectosigmoid colon and sigmoid colon biopsies taken for evaluation for microscopic colitis.  Intermittent rectal bleeding due to sigmoid colon polyp and internal hemorrhoids. Normal colonic biopsies. Tubular adenoma. Repeat surveillance 5-10 years.    ESOPHAGOGASTRODUODENOSCOPY  03/10/2008   SLF: Normal esophagus without evidence of Barrett, mass, erosion  ulceration, or stricture, small bowel bx negative, gastritis with NO h.pylori   ESOPHAGOGASTRODUODENOSCOPY (EGD) WITH PROPOFOL N/A 11/05/2017   Mild gastritis and duodenitis.    leocolonoscopy  03/10/2008   SHF:WYOVZC terminal ileum, approximately 10 cm visualized/Normal colon without evidence of polyps, mass, inflammatory changes, diverticula, or AVMs/Normal retroflexed view of the rectum. random colon bx negative.   MASTECTOMY     bilateral, status post reconstruction.   POLYPECTOMY  11/05/2017   Procedure: POLYPECTOMY;  Surgeon: Danie Binder, MD;  Location: [facility];  Service: Endoscopy;;  colon   TUBAL LIGATION      Family Psychiatric History: see below  Family History:  Family History  Problem Relation Age of Onset   COPD Mother    Depression Mother    Heart disease Mother    Hyperlipidemia Mother    Hypertension Mother    Cancer Mother        lung cancer, to bone   Cancer Father        skin   Heart disease Father    Hypertension Father    Hyperlipidemia Father    Learning disabilities Father    Drug abuse Brother        overdose at 75   Drug abuse Daughter        drug overdose 39   Cancer Maternal Uncle        lung cancer   Colon cancer Neg Hx     Social History:   Social History   Socioeconomic History   Marital status: Divorced    Spouse name: Not on file   Number of children: 2   Years of education: 13   Highest education level: Not on file  Occupational History   Occupation: disabled    Comment: neuropathy from chemo  Tobacco Use   Smoking status: Every Day    Packs/day: 0.50    Years: 20.00    Total pack years: 10.00    Types: Cigarettes    Start date: 05/07/1992   Smokeless tobacco: Never   Tobacco comments:    discussed  Substance and Sexual Activity   Alcohol use: Not Currently    Alcohol/week: 14.0 standard drinks of alcohol  Types: 14 Cans of beer per week    Comment: nightly   Drug use: No   Sexual activity: Yes    Birth control/protection: Post-menopausal  Other Topics Concern   Not on file  Social History Narrative   Divorced   Lives alone   Spends time with boyfriend - he is a smoker   Social Determinants of Radio broadcast assistant Strain: Low Risk  (04/26/2021)   Overall Financial Resource Strain (CARDIA)    Difficulty of Paying Living Expenses: Not hard at all  Food Insecurity: No Food Insecurity (04/26/2021)   Hunger Vital Sign    Worried About Running Out of Food in the Last Year: Never true    [place] in the Last Year: Never true  Transportation Needs: No Transportation Needs (04/26/2021)   PRAPARE - Hydrologist (Medical): No    Lack of Transportation (Non-Medical): No  Physical Activity: Insufficiently Active (04/26/2021)   Exercise Vital Sign    Days of Exercise per Week: 7 days    Minutes of Exercise per Session: 20 min  Stress: No Stress Concern Present (04/26/2021)   [place]    Feeling of Stress : Only a little  Social Connections: Moderately Isolated (04/26/2021)   Social Connection and Isolation Panel [NHANES]    Frequency of Communication with Friends and Family: More than three  times a week    Frequency of Social Gatherings with Friends and Family: Twice a week    Attends Religious Services: Never    Marine scientist or Organizations: No    Attends Archivist Meetings: Never    Marital Status: Living with partner    Allergies:  Allergies  Allergen Reactions   Vicodin [Hydrocodone-Acetaminophen] Nausea Only    Metabolic Disorder Labs: No results found for: "HGBA1C", "MPG" No results found for: "PROLACTIN" Lab Results  Component Value Date   CHOL 172 12/18/2019   TRIG 110 12/18/2019   HDL 53 12/18/2019   CHOLHDL 3.2 12/18/2019   VLDL 42 (H) 11/23/2016   LDLCALC 99 12/18/2019   LDLCALC 103 (H) 07/08/2017   Lab Results  Component Value Date   TSH 3.110 01/17/2021   TSH 2.620 12/18/2019    Therapeutic Level Labs: No results found for: "LITHIUM" No results found for: "VALPROATE" No results found for: "CBMZ"  Current Medications: Current Outpatient Medications  Medication Sig Dispense Refill   albuterol (VENTOLIN HFA) 108 (90 Base) MCG/ACT inhaler INHALE 2 PUFFS BY MOUTH EVERY 6 HOURS AS NEEDED FOR WHEEZING FOR SHORTNESS OF BREATH 9 g 2   benzonatate (TESSALON PERLES) 100 MG capsule Take 1 capsule (100 mg total) by mouth 3 (three) times daily as needed for cough. 20 capsule 0   Chlorpheniramine-DM (COUGH & COLD HBP PO) Take by mouth as needed.     cholecalciferol (VITAMIN D) 1000 units tablet Take 1,000 Units by mouth daily.     citalopram (CELEXA) 20 MG tablet Take 1 tablet (20 mg total) by mouth daily. 30 tablet 2   Crisaborole (EUCRISA) 2 % OINT Apply once daily to face for eczema (has failed topical corticosteroids x3) 60 g PRN   diazepam (VALIUM) 10 MG tablet Take 1 tablet (10 mg total) by mouth 3 (three) times daily. 90 tablet 2   dicyclomine (BENTYL) 10 MG capsule Take 1 capsule by mouth twice daily 180 capsule 0   fexofenadine (ALLEGRA) 180 MG tablet Take 1  tablet (180 mg total) by mouth daily as needed for allergies or  rhinitis. 90 tablet 3   fluticasone (FLONASE) 50 MCG/ACT nasal spray Place 1 spray into both nostrils 2 (two) times daily as needed for allergies or rhinitis. 16 g 6   fluticasone (FLOVENT HFA) 220 MCG/ACT inhaler Inhale 2 puffs by mouth twice daily 12 g 2   Loperamide HCl (ANTI-DIARRHEAL PO) Take by mouth as needed.     Multiple Vitamin (MULTIVITAMIN) capsule Take 1 capsule by mouth daily.     omeprazole (PRILOSEC) 40 MG capsule Take 1 capsule by mouth once daily 90 capsule 3   pravastatin (PRAVACHOL) 20 MG tablet Take 1 tablet (20 mg total) by mouth daily. 90 tablet 3   prazosin (MINIPRESS) 5 MG capsule Take 1 capsule (5 mg total) by mouth at bedtime. 30 capsule 2   temazepam (RESTORIL) 30 MG capsule Take 1 capsule (30 mg total) by mouth at bedtime as needed for sleep. 30 capsule 2   triamcinolone cream (KENALOG) 0.1 % APPLY 1 APPLICATION TOPICALLY TWICE DAILY UNTIL RASH CLEARS (7 TO 10 DAYS) 30 g 0   vitamin C (ASCORBIC ACID) 500 MG tablet Take 500 mg by mouth daily.     No current facility-administered medications for this visit.     Musculoskeletal: Strength & Muscle Tone: na Gait & Station: na Patient leans: N/A  Psychiatric Specialty Exam: Review of Systems  Gastrointestinal:  Positive for diarrhea.  All other systems reviewed and are negative.   Last menstrual period 07/09/2013.There is no height or weight on file to calculate BMI.  General Appearance: NA  Eye Contact:  NA  Speech:  Clear and Coherent  Volume:  Normal  Mood:  Euthymic  Affect:  NA  Thought Process:  Goal Directed  Orientation:  Full (Time, Place, and Person)  Thought Content: WDL   Suicidal Thoughts:  No  Homicidal Thoughts:  No  Memory:  Immediate;   Good Recent;   Good Remote;   Good  Judgement:  Good  Insight:  Good  Psychomotor Activity:  Normal  Concentration:  Concentration: Good and Attention Span: Good  Recall:  Good  Fund of Knowledge: Good  Language: Good  Akathisia:  No  Handed:   Right  AIMS (if indicated): not done  Assets:  Communication Skills Desire for Improvement Resilience Social Support Talents/Skills  ADL's:  Intact  Cognition: WNL  Sleep:  Good   Screenings: AUDIT    Flowsheet Row Clinical Support from 04/26/2021 in [place]  Alcohol Use Disorder Identification Test Final Score (AUDIT) 4      GAD-7    Flowsheet Row Office Visit from 05/31/2021 in [place] Office Visit from 01/17/2021 in [place] from 09/27/2020 in [facility] [facility]  Total GAD-7 Score '19 17 19      '$ PHQ2-9    Flowsheet Row Video Visit from 12/06/2021 in [facility] Video Visit from 09/05/2021 in [facility] Video Visit from 06/28/2021 in [place] Office Visit from 05/31/2021 in [place] from 04/26/2021 in [place]  PHQ-2 Total Score 0 0 '1 6 2  '$ PHQ-9 Total Score -- -- -- 26 6      Flowsheet Row Video Visit from 12/06/2021 in [facility] Video Visit from 09/05/2021 in [place] Video Visit from 06/28/2021 in [facility]  C-SSRS RISK CATEGORY No Risk No Risk No Risk        Assessment and Plan: This patient is a 61 year old female with a history of depression anxiety and PTSD.  She is doing well in terms of mood.  She will continue Celexa 20 mg daily for depression Valium 10 mg 3 times daily for anxiety, Restoril 30 mg at bedtime for sleep and prazosin 5 mg at bedtime to prevent nightmares.  She will return to see me in 3 months  Collaboration of Care: Collaboration of Care: Primary Care Provider AEB notes are available to PCP through the epic  system  Patient/Guardian was advised Release of Information must be obtained prior to any record release in order to collaborate their care with an outside provider. Patient/Guardian was advised if they have not already done so to contact the registration department to sign all necessary forms in order for [place] to release information regarding their care.   Consent: Patient/Guardian gives verbal consent for treatment and assignment of benefits for services provided during this visit. Patient/Guardian expressed understanding and agreed to proceed.    [name], MD 12/06/2021, 11:53 AM

## 2021-12-07 ENCOUNTER — Ambulatory Visit: Payer: Medicare Other | Admitting: Family

## 2021-12-13 ENCOUNTER — Other Ambulatory Visit: Payer: Self-pay | Admitting: Family Medicine

## 2022-01-10 ENCOUNTER — Encounter: Payer: Self-pay | Admitting: Family Medicine

## 2022-01-10 ENCOUNTER — Ambulatory Visit (INDEPENDENT_AMBULATORY_CARE_PROVIDER_SITE_OTHER): Payer: Medicare Other | Admitting: Family Medicine

## 2022-01-10 VITALS — BP 120/73 | HR 71 | Temp 97.8°F | Resp 20 | Ht 64.0 in | Wt 149.0 lb

## 2022-01-10 DIAGNOSIS — K580 Irritable bowel syndrome with diarrhea: Secondary | ICD-10-CM | POA: Diagnosis not present

## 2022-01-10 DIAGNOSIS — B9689 Other specified bacterial agents as the cause of diseases classified elsewhere: Secondary | ICD-10-CM

## 2022-01-10 DIAGNOSIS — R350 Frequency of micturition: Secondary | ICD-10-CM | POA: Diagnosis not present

## 2022-01-10 DIAGNOSIS — J019 Acute sinusitis, unspecified: Secondary | ICD-10-CM

## 2022-01-10 DIAGNOSIS — R300 Dysuria: Secondary | ICD-10-CM | POA: Diagnosis not present

## 2022-01-10 LAB — URINALYSIS, ROUTINE W REFLEX MICROSCOPIC
Bilirubin, UA: NEGATIVE
Glucose, UA: NEGATIVE
Leukocytes,UA: NEGATIVE
Nitrite, UA: NEGATIVE
Protein,UA: NEGATIVE
RBC, UA: NEGATIVE
Specific Gravity, UA: 1.015 (ref 1.005–1.030)
Urobilinogen, Ur: 0.2 mg/dL (ref 0.2–1.0)
pH, UA: 7 (ref 5.0–7.5)

## 2022-01-10 LAB — MICROSCOPIC EXAMINATION
RBC, Urine: NONE SEEN /hpf (ref 0–2)
Renal Epithel, UA: NONE SEEN /hpf
WBC, UA: NONE SEEN /hpf (ref 0–5)

## 2022-01-10 MED ORDER — RIFAXIMIN 550 MG PO TABS: 550.0000 mg | ORAL_TABLET | Freq: Three times a day (TID) | ORAL | 0 refills | Status: DC

## 2022-01-10 MED ORDER — CEFDINIR 300 MG PO CAPS: 300.0000 mg | ORAL_CAPSULE | Freq: Two times a day (BID) | ORAL | 0 refills | Status: DC

## 2022-01-10 NOTE — Patient Instructions (Signed)
Low-FODMAP Eating Plan  FODMAP stands for fermentable oligosaccharides, disaccharides, monosaccharides, and polyols. These are sugars that are hard for some people to digest. A low-FODMAP eating plan may help some people who have irritable bowel syndrome (IBS) and certain other bowel (intestinal) diseases to manage their symptoms. This meal plan can be complicated to follow. Work with a diet and nutrition specialist (dietitian) to make a low-FODMAP eating plan that is right for you. A dietitian can help make sure that you get enough nutrition from this diet. What are tips for following this plan? Reading food labels Check labels for hidden FODMAPs such as: High-fructose syrup. Honey. Agave. Natural fruit flavors. Onion or garlic powder. Choose low-FODMAP foods that contain 3-4 grams of fiber per serving. Check food labels for serving sizes. Eat only one serving at a time to make sure FODMAP levels stay low. Shopping Shop with a list of foods that are recommended on this diet and make a meal plan. Meal planning Follow a low-FODMAP eating plan for up to 6 weeks, or as told by your health care provider or dietitian. To follow the eating plan: Eliminate high-FODMAP foods from your diet completely. Choose only low-FODMAP foods to eat. You will do this for 2-6 weeks. Gradually reintroduce high-FODMAP foods into your diet one at a time. Most people should wait a few days before introducing the next new high-FODMAP food into their meal plan. Your dietitian can recommend how quickly you may reintroduce foods. Keep a daily record of what and how much you eat and drink. Make note of any symptoms that you have after eating. Review your daily record with a dietitian regularly to identify which foods you can eat and which foods you should avoid. General tips Drink enough fluid each day to keep your urine pale yellow. Avoid processed foods. These often have added sugar and may be high in FODMAPs. Avoid  most dairy products, whole grains, and sweeteners. Work with a dietitian to make sure you get enough fiber in your diet. Avoid high FODMAP foods at meals to manage symptoms. Recommended foods Fruits Bananas, oranges, tangerines, lemons, limes, blueberries, raspberries, strawberries, grapes, cantaloupe, honeydew melon, kiwi, papaya, passion fruit, and pineapple. Limited amounts of dried cranberries, banana chips, and shredded coconut. Vegetables Eggplant, zucchini, cucumber, peppers, green beans, bean sprouts, lettuce, arugula, kale, Swiss chard, spinach, collard greens, bok choy, summer squash, potato, and tomato. Limited amounts of corn, carrot, and sweet potato. Green parts of scallions. Grains Gluten-free grains, such as rice, oats, buckwheat, quinoa, corn, polenta, and millet. Gluten-free pasta, bread, or cereal. Rice noodles. Corn tortillas. Meats and other proteins Unseasoned beef, pork, poultry, or fish. Eggs. Bacon. Tofu (firm) and tempeh. Limited amounts of nuts and seeds, such as almonds, walnuts, brazil nuts, pecans, peanuts, nut butters, pumpkin seeds, chia seeds, and sunflower seeds. Dairy Lactose-free milk, yogurt, and kefir. Lactose-free cottage cheese and ice cream. Non-dairy milks, such as almond, coconut, hemp, and rice milk. Non-dairy yogurt. Limited amounts of goat cheese, brie, mozzarella, parmesan, swiss, and other hard cheeses. Fats and oils Butter-free spreads. Vegetable oils, such as olive, canola, and sunflower oil. Seasoning and other foods Artificial sweeteners with names that do not end in "ol," such as aspartame, saccharine, and stevia. Maple syrup, white table sugar, raw sugar, brown sugar, and molasses. Mayonnaise, soy sauce, and tamari. Fresh basil, coriander, parsley, rosemary, and thyme. Beverages Water and mineral water. Sugar-sweetened soft drinks. Small amounts of orange juice or cranberry juice. Black and green tea. Most dry wines.   Coffee. The items listed  above may not be a complete list of foods and beverages you can eat. Contact a dietitian for more information. Foods to avoid Fruits Fresh, dried, and juiced forms of apple, pear, watermelon, peach, plum, cherries, apricots, blackberries, boysenberries, figs, nectarines, and mango. Avocado. Vegetables Chicory root, artichoke, asparagus, cabbage, snow peas, Brussels sprouts, broccoli, sugar snap peas, mushrooms, celery, and cauliflower. Onions, garlic, leeks, and the white part of scallions. Grains Wheat, including kamut, durum, and semolina. Barley and bulgur. Couscous. Wheat-based cereals. Wheat noodles, bread, crackers, and pastries. Meats and other proteins Fried or fatty meat. Sausage. Cashews and pistachios. Soybeans, baked beans, black beans, chickpeas, kidney beans, fava beans, navy beans, lentils, black-eyed peas, and split peas. Dairy Milk, yogurt, ice cream, and soft cheese. Cream and sour cream. Milk-based sauces. Custard. Buttermilk. Soy milk. Seasoning and other foods Any sugar-free gum or candy. Foods that contain artificial sweeteners such as sorbitol, mannitol, isomalt, or xylitol. Foods that contain honey, high-fructose corn syrup, or agave. Bouillon, vegetable stock, beef stock, and chicken stock. Garlic and onion powder. Condiments made with onion, such as hummus, chutney, pickles, relish, salad dressing, and salsa. Tomato paste. Beverages Chicory-based drinks. Coffee substitutes. Chamomile tea. Fennel tea. Sweet or fortified wines such as port or sherry. Diet soft drinks made with isomalt, mannitol, maltitol, sorbitol, or xylitol. Apple, pear, and mango juice. Juices with high-fructose corn syrup. The items listed above may not be a complete list of foods and beverages you should avoid. Contact a dietitian for more information. Summary FODMAP stands for fermentable oligosaccharides, disaccharides, monosaccharides, and polyols. These are sugars that are hard for some people to  digest. A low-FODMAP eating plan is a short-term diet that helps to ease symptoms of certain bowel diseases. The eating plan usually lasts up to 6 weeks. After that, high-FODMAP foods are reintroduced gradually and one at a time. This can help you find out which foods may be causing symptoms. A low-FODMAP eating plan can be complicated. It is best to work with a dietitian who has experience with this type of plan. This information is not intended to replace advice given to you by your health care provider. Make sure you discuss any questions you have with your health care provider. Document Revised: 09/10/2019 Document Reviewed: 09/10/2019 Elsevier Patient Education  2023 [employer]  

## 2022-01-10 NOTE — Progress Notes (Signed)
Subjective: CC: Diarrhea PCP: [name], DO WUJ:[id number] I [name] is a 61 y.o. female presenting to clinic today for:  1.  Diarrhea Patient reports that she has been having explosive diarrhea for the last several months.  This occurs with every meal that she eats and sometimes occurs at random times that she does not know into the very last minute that she has to go.  He had stools 15 times in 1 day.  She denies any blood in stool.  She has not reached out to her gastroenterologist about this.  Denies any associated nausea, vomiting.  She has some mild cramping in the lower abdominal area but she is not sure if this is secondary to a UTI as she is also been having a couple weeks history of urinary frequency.  No blood in urine.  No fevers.  She has tried various over-the-counter products including Imodium, probiotics.  2.  Sinusitis Patient reports that she has some sinus symptoms as well despite use of antihistamines.  She has been afebrile.  No hemoptysis.  No shortness of breath reported.  Symptoms have been worsened over the last week and a half.  She has developed a sinus headache.  Home COVID test was negative   ROS: Per HPI  Allergies  Allergen Reactions   Vicodin [Hydrocodone-Acetaminophen] Nausea Only   Past Medical History:  Diagnosis Date   Allergy    Anxiety    Blood transfusion without reported diagnosis    Breast cancer ([place])    Breast cancer ([place]) 04/21/2012   Stage II (T1c N1 M0) grade 3 triple negative breast cancer, right- sided with 1 of 5 positive nodes, status post FEC in a dose dense fashion for 6 cycles followed by radiation therapy by Dr. Sondra Come for her high-risk disease with her initial date of surgery in February 2008.    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   COPD (chronic obstructive pulmonary disease) (HCC)    Depression    Emphysema of lung (HCC)    GERD (gastroesophageal reflux disease) 12/18/2012   Headache    Hyperlipidemia     Neuropathy    Osteoporosis    PONV (postoperative nausea and vomiting)    PTSD (post-traumatic stress disorder)    Substance abuse (HCC)     Current Outpatient Medications:    albuterol (VENTOLIN HFA) 108 (90 Base) MCG/ACT inhaler, INHALE 2 PUFFS BY MOUTH EVERY 6 HOURS AS NEEDED FOR WHEEZING FOR SHORTNESS OF BREATH, Disp: 9 g, Rfl: 2   cefdinir (OMNICEF) 300 MG capsule, Take 1 capsule (300 mg total) by mouth 2 (two) times daily. 1 po BID, Disp: 20 capsule, Rfl: 0   cholecalciferol (VITAMIN D) 1000 units tablet, Take 1,000 Units by mouth daily., Disp: , Rfl:    citalopram (CELEXA) 20 MG tablet, Take 1 tablet (20 mg total) by mouth daily., Disp: 30 tablet, Rfl: 2   Crisaborole (EUCRISA) 2 % OINT, Apply once daily to face for eczema (has failed topical corticosteroids x3), Disp: 60 g, Rfl: PRN   diazepam (VALIUM) 10 MG tablet, Take 1 tablet (10 mg total) by mouth 3 (three) times daily., Disp: 90 tablet, Rfl: 2   dicyclomine (BENTYL) 10 MG capsule, Take 1 capsule by mouth twice daily, Disp: 180 capsule, Rfl: 0   fexofenadine (ALLEGRA) 180 MG tablet, Take 1 tablet (180 mg total) by mouth daily as needed for allergies or rhinitis., Disp: 90 tablet, Rfl: 3   fluticasone (FLONASE) 50 MCG/ACT nasal spray, Place 1 spray into  both nostrils 2 (two) times daily as needed for allergies or rhinitis., Disp: 16 g, Rfl: 6   fluticasone (FLOVENT HFA) 220 MCG/ACT inhaler, Inhale 2 puffs by mouth twice daily, Disp: 12 g, Rfl: 2   omeprazole (PRILOSEC) 40 MG capsule, Take 1 capsule by mouth once daily, Disp: 90 capsule, Rfl: 3   pravastatin (PRAVACHOL) 20 MG tablet, Take 1 tablet (20 mg total) by mouth daily., Disp: 90 tablet, Rfl: 3   rifaximin (XIFAXAN) 550 MG TABS tablet, Take 1 tablet (550 mg total) by mouth 3 (three) times daily for 14 days., Disp: 42 tablet, Rfl: 0   temazepam (RESTORIL) 30 MG capsule, Take 1 capsule (30 mg total) by mouth at bedtime as needed for sleep., Disp: 30 capsule, Rfl: 2    triamcinolone cream (KENALOG) 0.1 %, APPLY 1 APPLICATION TOPICALLY TWICE DAILY UNTIL RASH CLEARS (7 TO 10 DAYS), Disp: 30 g, Rfl: 0   vitamin C (ASCORBIC ACID) 500 MG tablet, Take 500 mg by mouth daily., Disp: , Rfl:    Loperamide HCl (ANTI-DIARRHEAL PO), Take by mouth as needed. (Patient not taking: Reported on 01/10/2022), Disp: , Rfl:    Multiple Vitamin (MULTIVITAMIN) capsule, Take 1 capsule by mouth daily. (Patient not taking: Reported on 01/10/2022), Disp: , Rfl:    prazosin (MINIPRESS) 5 MG capsule, Take 1 capsule (5 mg total) by mouth at bedtime., Disp: 30 capsule, Rfl: 2 Social History   Socioeconomic History   Marital status: Divorced    Spouse name: Not on file   Number of children: 2   Years of education: 13   Highest education level: Not on file  Occupational History   Occupation: disabled    Comment: neuropathy from chemo  Tobacco Use   Smoking status: Every Day    Packs/day: 0.50    Years: 20.00    Total pack years: 10.00    Types: Cigarettes    Start date: 05/07/1992   Smokeless tobacco: Never   Tobacco comments:    discussed  Substance and Sexual Activity   Alcohol use: Not Currently    Alcohol/week: 14.0 standard drinks of alcohol    Types: 14 Cans of beer per week    Comment: nightly   Drug use: No   Sexual activity: Yes    Birth control/protection: Post-menopausal  Other Topics Concern   Not on file  Social History Narrative   Divorced   Lives alone   Spends time with boyfriend - he is a smoker   Social Determinants of Radio broadcast assistant Strain: Low Risk  (04/26/2021)   Overall Financial Resource Strain (CARDIA)    Difficulty of Paying Living Expenses: Not hard at all  Food Insecurity: No Food Insecurity (04/26/2021)   Hunger Vital Sign    Worried About Running Out of Food in the Last Year: Never true    Ran Out of Food in the Last Year: Never true  Transportation Needs: No Transportation Needs (04/26/2021)   PRAPARE - Civil engineer, contracting (Medical): No    Lack of Transportation (Non-Medical): No  Physical Activity: Insufficiently Active (04/26/2021)   Exercise Vital Sign    Days of Exercise per Week: 7 days    Minutes of Exercise per Session: 20 min  Stress: No Stress Concern Present (04/26/2021)   [place]    Feeling of Stress : Only a little  Social Connections: Moderately Isolated (04/26/2021)   Social Connection and Isolation  Panel [NHANES]    Frequency of Communication with Friends and Family: More than three times a week    Frequency of Social Gatherings with Friends and Family: Twice a week    Attends Religious Services: Never    Marine scientist or Organizations: No    Attends Archivist Meetings: Never    Marital Status: Living with partner  Intimate Partner Violence: Not At Risk (04/26/2021)   Humiliation, Afraid, Rape, and Kick questionnaire    Fear of Current or Ex-Partner: No    Emotionally Abused: No    Physically Abused: No    Sexually Abused: No   Family History  Problem Relation Age of Onset   COPD Mother    Depression Mother    Heart disease Mother    Hyperlipidemia Mother    Hypertension Mother    Cancer Mother        lung cancer, to bone   Cancer Father        skin   Heart disease Father    Hypertension Father    Hyperlipidemia Father    Learning disabilities Father    Drug abuse Brother        overdose at 73   Drug abuse Daughter        drug overdose 69   Cancer Maternal Uncle        lung cancer   Colon cancer Neg Hx     Objective: Office vital signs reviewed. BP 120/73   Pulse 71   Temp 97.8 F (36.6 C) (Temporal)   Resp 20   Ht '5\' 4"'$  (1.626 m)   Wt 149 lb (67.6 kg)   LMP 07/09/2013   SpO2 95%   BMI 25.58 kg/m   Physical Examination:  General: Awake, alert, nontoxic-appearing female, No acute distress HEENT: Sclera anicteric Cardio: regular rate and rhythm, S1S2  heard, no murmurs appreciated Pulm: clear to auscultation bilaterally, no wheezes, rhonchi or rales; normal work of breathing on room air GI: soft, mild lower abdominal tenderness.  Non-distended, bowel sounds present x4, no hepatomegaly, no splenomegaly, no masses  Assessment/ Plan: 62 y.o. female   Irritable bowel syndrome with diarrhea - Plan: rifaximin (XIFAXAN) 550 MG TABS tablet, Ambulatory referral to Gastroenterology  Acute bacterial sinusitis - Plan: cefdinir (OMNICEF) 300 MG capsule  Urinary frequency - Plan: Urinalysis, Routine w reflex microscopic  Patient had similar issues last year and had stool studies and blood work which showed no acute findings or explanation for GI issues.  Now that they are recurrent and more severe, I will trial her on Xifaxan as she has failed conservative measures and OTC products including probiotics and Imodium.  I have referred her back to her gastroenterologist for further evaluation as well in case this is not helpful.  Furthermore she has lost over 20 pounds since her checkup with me in January.  There is a family history of Crohn's disease, not sure if this is something that she has going on but hopefully GI can be insightful  Omnicef prescribed for acute bacterial sinusitis.  There was no evidence of urinary tract infection Orders Placed This Encounter  Procedures   Microscopic Examination   Urinalysis, Routine w reflex microscopic   Ambulatory referral to Gastroenterology    Referral Priority:   Routine    Referral Type:   Consultation    Referral Reason:   Specialty Services Required    Number of Visits Requested:   1   Meds ordered this encounter  Medications   cefdinir (OMNICEF) 300 MG capsule    Sig: Take 1 capsule (300 mg total) by mouth 2 (two) times daily. 1 po BID    Dispense:  20 capsule    Refill:  0   rifaximin (XIFAXAN) 550 MG TABS tablet    Sig: Take 1 tablet (550 mg total) by mouth 3 (three) times daily for 14 days.     Dispense:  42 tablet    Refill:  [place], DO [place] [phone]

## 2022-01-11 ENCOUNTER — Other Ambulatory Visit: Payer: Self-pay | Admitting: Gastroenterology

## 2022-01-11 ENCOUNTER — Encounter: Payer: Self-pay | Admitting: *Deleted

## 2022-01-12 ENCOUNTER — Telehealth: Payer: Self-pay

## 2022-01-12 NOTE — Telephone Encounter (Signed)
Sharley Keeler (Key: [id number]) Rx #: [id number] Xifaxan '550MG'$  tablets   Form [employer] Medicare Part D Electronic Prior Authorization Form (2017 NCPDP) Created 2 days ago Sent to Plan 5 minutes ago Plan Response 5 minutes ago Submit Clinical Questions 4 minutes ago Determination Favorable less than a minute ago Message from Plan Request Reference Number: [id number]. XIFAXAN TAB '550MG'$  is approved through 01/26/2022. Your patient may now fill this prescription and it will be cove

## 2022-01-12 NOTE — Telephone Encounter (Signed)
[name] (Key: [id number]) Rx #: [id number] Xifaxan '550MG'$  tablets   Form [employer] Medicare Part D Electronic Prior Authorization Form (2017 NCPDP) Created 2 days ago Sent to Plan 1 minute ago Plan Response 1 minute ago Submit Clinical Questions less than a minute ago Determination Wait for Determination Please wait for [employer] Medicare 2017 NCPDP to return a determination.

## 2022-01-18 NOTE — Progress Notes (Unsigned)
Referring Provider: *** Primary Care Physician:  [name], DO  Primary GI: Dr. Abbey Chatters  Patient Location: Home   Provider Location: [facility] office   Reason for Visit: ***   Persons present on the virtual encounter, with roles: Aliene Altes, PA-C (Provider), [name] (patient)   Total time (minutes) spent on medical discussion: ### minutes  Virtual Visit via *** Note Due to COVID-19, visit is conducted virtually and was requested by patient.   I connected with Westley Hummer on 01/18/22 at  9:00 AM EDT by *** and verified that I am speaking with the correct person using two identifiers.   I discussed the limitations, risks, security and privacy concerns of performing an evaluation and management service by *** and the availability of in person appointments. I also discussed with the patient that there may be a patient responsible charge related to this service. The patient expressed understanding and agreed to proceed.  No chief complaint on file.    History of Present Illness: [name] is a 61 y.o. female with a history of adenomatous colon polyps due for surveillance colonoscopy between 2024-2029, diarrhea, prior colon biopsies negative for microscopic colitis, duodenal biopsies benign, stool studies negative in 2019, TSH within normal limits. Also with history of GERD, gastritis, duodenitis. She is presenting today at the request of PCP for IBS.  Last seen in our office 01/21/2019.  Noted chronic diarrhea.  Loose stools continue present middle of the night.  Could also cough and sometimes have an accident on herself.  On a bad day, 4-5 bowel movements per day.  Associated lower abdominal discomfort and sometimes upper abdominal discomfort/cramping. Avoiding dairy other than cheese. She was using Pepto-Bismol as needed.  She is also having burning in her throat taking Prilosec at night while eating.  Admitted to eating late.  Also drinking quite a bit of caffeine  and carbonated beverages. Recommended trial of Bentyl BID and may increase if needed. Also recommended taking PPI on empty stomach, pepcid at night as needed, and counseled on GERD diet/lifestyle.   Recently saw PCP 01/10/2022 and reported explosive diarrhea for the last several months occurring with every meal and random at times.  Reported 15 stools in 1 day.  Tried several over-the-counter products including Imodium and probiotics without improvement.  Also with some lower abdominal cramping as well as urinary frequency.  No BRBPR, melena, hematuria.  Also reported sinus symptoms and noted greater than 20 pound weight loss since January.  She was prescribed Xifaxan for suspected IBS-D.  UA ordered to evaluate for UTI.  Cefdinir prescribed for acute bacterial sinusitis.  UA was negative, only few bacteria on microscopic examination.  Today:     Past Medical History:  Diagnosis Date   Allergy    Anxiety    Blood transfusion without reported diagnosis    Breast cancer ([place])    Breast cancer ([place]) 04/21/2012   Stage II (T1c N1 M0) grade 3 triple negative breast cancer, right- sided with 1 of 5 positive nodes, status post FEC in a dose dense fashion for 6 cycles followed by radiation therapy by Dr. Sondra Come for her high-risk disease with her initial date of surgery in February 2008.    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   COPD (chronic obstructive pulmonary disease) (HCC)    Depression    Emphysema of lung (HCC)    GERD (gastroesophageal reflux disease) 12/18/2012   Headache    Hyperlipidemia    Neuropathy    Osteoporosis  PONV (postoperative nausea and vomiting)    PTSD (post-traumatic stress disorder)    Substance abuse [facility])      Past Surgical History:  Procedure Laterality Date   BIOPSY  11/05/2017   Procedure: BIOPSY;  Surgeon: Danie Binder, MD;  Location: [facility] ENDO SUITE;  Service: Endoscopy;;  colon duodenum gastric   BREAST SURGERY     CATARACT EXTRACTION  W/PHACO Left 08/15/2017   Procedure: CATARACT EXTRACTION WITH PHACOEMULSIFICATION  AND INTRAOCULAR LENS PLACEMENT LEFT EYE CDE=2.68;  Surgeon: Baruch Goldmann, MD;  Location: [facility] ORS;  Service: Ophthalmology;  Laterality: Left;  left   CATARACT EXTRACTION W/PHACO Right 09/06/2017   Procedure: CATARACT EXTRACTION PHACO AND INTRAOCULAR LENS PLACEMENT (IOC);  Surgeon: Baruch Goldmann, MD;  Location: [facility] ORS;  Service: Ophthalmology;  Laterality: Right;  CDE: 1.61   COLONOSCOPY WITH PROPOFOL N/A 11/05/2017    6 mm polyp in the sigmoid colon which was sessile and removed.  Rectosigmoid colon and sigmoid colon biopsies taken for evaluation for microscopic colitis.  Intermittent rectal bleeding due to sigmoid colon polyp and internal hemorrhoids. Normal colonic biopsies. Tubular adenoma. Repeat surveillance 5-10 years.    ESOPHAGOGASTRODUODENOSCOPY  03/10/2008   SLF: Normal esophagus without evidence of Barrett, mass, erosion  ulceration, or stricture, small bowel bx negative, gastritis with NO h.pylori   ESOPHAGOGASTRODUODENOSCOPY (EGD) WITH PROPOFOL N/A 11/05/2017   Mild gastritis and duodenitis.    leocolonoscopy  03/10/2008   BPZ:WCHENI terminal ileum, approximately 10 cm visualized/Normal colon without evidence of polyps, mass, inflammatory changes, diverticula, or AVMs/Normal retroflexed view of the rectum. random colon bx negative.   MASTECTOMY     bilateral, status post reconstruction.   POLYPECTOMY  11/05/2017   Procedure: POLYPECTOMY;  Surgeon: Danie Binder, MD;  Location: [facility];  Service: Endoscopy;;  colon   TUBAL LIGATION       No outpatient medications have been marked as taking for the 01/19/22 encounter (Appointment) with Erenest Rasher, PA-C.     Family History  Problem Relation Age of Onset   COPD Mother    Depression Mother    Heart disease Mother    Hyperlipidemia Mother    Hypertension Mother    Cancer Mother        lung cancer, to bone   Cancer Father        skin    Heart disease Father    Hypertension Father    Hyperlipidemia Father    Learning disabilities Father    Drug abuse Brother        overdose at 40   Drug abuse Daughter        drug overdose 51   Cancer Maternal Uncle        lung cancer   Colon cancer Neg Hx     Social History   Socioeconomic History   Marital status: Divorced    Spouse name: Not on file   Number of children: 2   Years of education: 69   Highest education level: Not on file  Occupational History   Occupation: disabled    Comment: neuropathy from chemo  Tobacco Use   Smoking status: Every Day    Packs/day: 0.50    Years: 20.00    Total pack years: 10.00    Types: Cigarettes    Start date: 05/07/1992   Smokeless tobacco: Never   Tobacco comments:    discussed  Substance and Sexual Activity   Alcohol use: Not Currently    Alcohol/week: 14.0 standard drinks  of alcohol    Types: 14 Cans of beer per week    Comment: nightly   Drug use: No   Sexual activity: Yes    Birth control/protection: Post-menopausal  Other Topics Concern   Not on file  Social History Narrative   Divorced   Lives alone   Spends time with boyfriend - he is a smoker   Social Determinants of Radio broadcast assistant Strain: Low Risk  (04/26/2021)   Overall Financial Resource Strain (CARDIA)    Difficulty of Paying Living Expenses: Not hard at all  Food Insecurity: No Food Insecurity (04/26/2021)   Hunger Vital Sign    Worried About Running Out of Food in the Last Year: Never true    [place] in the Last Year: Never true  Transportation Needs: No Transportation Needs (04/26/2021)   PRAPARE - Hydrologist (Medical): No    Lack of Transportation (Non-Medical): No  Physical Activity: Insufficiently Active (04/26/2021)   Exercise Vital Sign    Days of Exercise per Week: 7 days    Minutes of Exercise per Session: 20 min  Stress: No Stress Concern Present (04/26/2021)   [place]    Feeling of Stress : Only a little  Social Connections: Moderately Isolated (04/26/2021)   Social Connection and Isolation Panel [NHANES]    Frequency of Communication with Friends and Family: More than three times a week    Frequency of Social Gatherings with Friends and Family: Twice a week    Attends Religious Services: Never    Marine scientist or Organizations: No    Attends Music therapist: Never    Marital Status: Living with partner       Review of Systems: Gen: Denies fever, chills, cold or flulike symptoms, presyncope, syncope. CV: Denies chest pain, palpitations. Resp: Denies dyspnea, cough.  GI: see HPI Heme: See HPI  Observations/Objective: No distress. Alert and oriented. Pleasant. Well nourished. Normal mood and affect. Unable to perform complete physical exam due to *** encounter. No video available. ***   Assessment:     Plan: ***     I discussed the assessment and treatment plan with the patient. The patient was provided an opportunity to ask questions and all were answered. The patient agreed with the plan and demonstrated an understanding of the instructions.   The patient was advised to call back or seek an in-person evaluation if the symptoms worsen or if the condition fails to improve as anticipated.  I provided *** minutes of non***-face-to-face time during this encounter.  Aliene Altes, PA-C [facility] Gastroenterology  01/19/2022

## 2022-01-19 ENCOUNTER — Encounter: Payer: Self-pay | Admitting: Gastroenterology

## 2022-01-19 ENCOUNTER — Telehealth (INDEPENDENT_AMBULATORY_CARE_PROVIDER_SITE_OTHER): Payer: Medicare Other | Admitting: Gastroenterology

## 2022-01-19 ENCOUNTER — Telehealth: Payer: Self-pay | Admitting: Gastroenterology

## 2022-01-19 ENCOUNTER — Telehealth: Payer: Self-pay | Admitting: *Deleted

## 2022-01-19 VITALS — Ht 64.0 in | Wt 152.0 lb

## 2022-01-19 DIAGNOSIS — R634 Abnormal weight loss: Secondary | ICD-10-CM | POA: Diagnosis not present

## 2022-01-19 DIAGNOSIS — R197 Diarrhea, unspecified: Secondary | ICD-10-CM | POA: Diagnosis not present

## 2022-01-19 DIAGNOSIS — R109 Unspecified abdominal pain: Secondary | ICD-10-CM

## 2022-01-19 MED ORDER — DICYCLOMINE HCL 10 MG PO CAPS: 10.0000 mg | ORAL_CAPSULE | Freq: Three times a day (TID) | ORAL | 1 refills | Status: DC

## 2022-01-19 NOTE — Telephone Encounter (Signed)
[name], you are scheduled for a virtual visit with your provider today.  Just as we do with appointments in the office, we must obtain your consent to participate.  Your consent will be active for this visit and any virtual visit you may have with one of our providers in the next 365 days.  If you have a MyChart account, I can also send a copy of this consent to you electronically.  All virtual visits are billed to your insurance company just like a traditional visit in the office.  As this is a virtual visit, video technology does not allow for your provider to perform a traditional examination.  This may limit your provider's ability to fully assess your condition.  If your provider identifies any concerns that need to be evaluated in person or the need to arrange testing such as labs, EKG, etc, we will make arrangements to do so.  Although advances in technology are sophisticated, we cannot ensure that it will always work on either your end or our end.  If the connection with a video visit is poor, we may have to switch to a telephone visit.  With either a video or telephone visit, we are not always able to ensure that we have a secure connection.   I need to obtain your verbal consent now.   Are you willing to proceed with your visit today? Patient consent to virtual video visit

## 2022-01-19 NOTE — Patient Instructions (Signed)
Please have blood work and stool studies completed at [facility] may start dicyclomine 10 mg up to 3 times daily before meals to control your diarrhea and abdominal cramping.  Follow a bland diet for now.  Avoid fried, fatty, greasy, spicy foods.  Drink enough liquids to keep your urine pale yellow to clear.  You should drink some liquids that contain electrolytes such as Pedialyte, Gatorade, G2, etc.  Further recommendations to follow.  Aliene Altes, PA-C [facility] Gastroenterology

## 2022-01-19 NOTE — Telephone Encounter (Signed)
[name], patient needs labs and stool studies completed at [facility] with her primary care provider.  I have placed lab orders for [facility].  Can you please make sure that [place] has the orders and that patient is able to go there to have her blood work and stool studies completed?  Please let patient know if she is able to have this completed at [facility].

## 2022-01-22 ENCOUNTER — Other Ambulatory Visit: Payer: Medicare Other

## 2022-01-22 NOTE — Telephone Encounter (Signed)
Noted. Labs were mailed to pt and yes labs can be completed at [facility].

## 2022-01-22 NOTE — Telephone Encounter (Signed)
Noted. Please make sure patient is aware.

## 2022-01-22 NOTE — Telephone Encounter (Signed)
Noted  

## 2022-02-21 ENCOUNTER — Ambulatory Visit: Payer: Medicare Other | Admitting: Gastroenterology

## 2022-03-08 ENCOUNTER — Telehealth (INDEPENDENT_AMBULATORY_CARE_PROVIDER_SITE_OTHER): Payer: Medicare Other | Admitting: Psychiatry

## 2022-03-08 ENCOUNTER — Encounter (HOSPITAL_COMMUNITY): Payer: Self-pay | Admitting: Psychiatry

## 2022-03-08 DIAGNOSIS — F4310 Post-traumatic stress disorder, unspecified: Secondary | ICD-10-CM | POA: Diagnosis not present

## 2022-03-08 DIAGNOSIS — F411 Generalized anxiety disorder: Secondary | ICD-10-CM

## 2022-03-08 DIAGNOSIS — F332 Major depressive disorder, recurrent severe without psychotic features: Secondary | ICD-10-CM | POA: Diagnosis not present

## 2022-03-08 MED ORDER — TEMAZEPAM 30 MG PO CAPS: 30.0000 mg | ORAL_CAPSULE | Freq: Every evening | ORAL | 2 refills | Status: DC | PRN

## 2022-03-08 MED ORDER — PRAZOSIN HCL 5 MG PO CAPS: 5.0000 mg | ORAL_CAPSULE | Freq: Every day | ORAL | 2 refills | Status: DC

## 2022-03-08 MED ORDER — DIAZEPAM 10 MG PO TABS: 10.0000 mg | ORAL_TABLET | Freq: Three times a day (TID) | ORAL | 2 refills | Status: DC

## 2022-03-08 MED ORDER — CITALOPRAM HYDROBROMIDE 20 MG PO TABS: 20.0000 mg | ORAL_TABLET | Freq: Every day | ORAL | 2 refills | Status: DC

## 2022-03-08 NOTE — Progress Notes (Signed)
BH MD/PA/NP OP Progress Note  03/08/2022 2:06 PM [name]  MRN:  [id number]  Chief Complaint:  Chief Complaint  Patient presents with   Depression   Anxiety   Follow-up   HPI: This patient is a 61 year old divorced white female who lives with her boyfriend in [place].  She has 1 grown son and 1 grandson.  She has a daughter who died in 12-Aug-2011 of a narcotic overdose.  She is on disability.  The patient returns for follow-up after 3 months.  She states that she is staying very busy.  She and her son have been going to meet up with his baby 52 between [place] and [place] every couple of weeks to see the baby.  She has been enjoying this time tremendously.  She denies significant depression anxiety thoughts of self-harm.  She is sleeping much better without nightmares.  She is continuing to have diarrhea but it is better now that she is on dicyclomine. Visit Diagnosis:    ICD-10-CM   1. Major depressive disorder, recurrent, severe without psychotic features ([place])  F33.2     2. Generalized anxiety disorder  F41.1     3. PTSD (post-traumatic stress disorder)  F43.10       Past Psychiatric History: Past outpatient treatment  Past Medical History:  Past Medical History:  Diagnosis Date   Allergy    Anxiety    Blood transfusion without reported diagnosis    Breast cancer ([place])    Breast cancer ([place]) 04/21/2012   Stage II (T1c N1 M0) grade 3 triple negative breast cancer, right- sided with 1 of 5 positive nodes, status post FEC in a dose dense fashion for 6 cycles followed by radiation therapy by Dr. Sondra Come for her high-risk disease with her initial date of surgery in February 2008.    Chemotherapy-induced neuropathy ([place]) 06/21/2016   COPD (chronic obstructive pulmonary disease) (HCC)    Depression    Emphysema of lung (HCC)    GERD (gastroesophageal reflux disease) 12/18/2012   Headache    Hyperlipidemia    Neuropathy    Osteoporosis    PONV (postoperative  nausea and vomiting)    PTSD (post-traumatic stress disorder)    Substance abuse [facility])     Past Surgical History:  Procedure Laterality Date   BIOPSY  11/05/2017   Procedure: BIOPSY;  Surgeon: Danie Binder, MD;  Location: [facility];  Service: Endoscopy;;  colon duodenum gastric   BREAST SURGERY     CATARACT EXTRACTION W/PHACO Left 08/15/2017   Procedure: CATARACT EXTRACTION WITH PHACOEMULSIFICATION  AND INTRAOCULAR LENS PLACEMENT LEFT EYE CDE=2.68;  Surgeon: Baruch Goldmann, MD;  Location: [facility] ORS;  Service: Ophthalmology;  Laterality: Left;  left   CATARACT EXTRACTION W/PHACO Right 09/06/2017   Procedure: CATARACT EXTRACTION PHACO AND INTRAOCULAR LENS PLACEMENT (IOC);  Surgeon: Baruch Goldmann, MD;  Location: [facility];  Service: Ophthalmology;  Laterality: Right;  CDE: 1.61   COLONOSCOPY WITH PROPOFOL N/A 11/05/2017    6 mm polyp in the sigmoid colon which was sessile and removed.  Rectosigmoid colon and sigmoid colon biopsies taken for evaluation for microscopic colitis.  Intermittent rectal bleeding due to sigmoid colon polyp and internal hemorrhoids. Normal colonic biopsies. Tubular adenoma. Repeat surveillance 5-10 years.    ESOPHAGOGASTRODUODENOSCOPY  03/10/2008   SLF: Normal esophagus without evidence of Barrett, mass, erosion  ulceration, or stricture, small bowel bx negative, gastritis with NO h.pylori   ESOPHAGOGASTRODUODENOSCOPY (EGD) WITH PROPOFOL N/A 11/05/2017   Mild gastritis and  duodenitis.    leocolonoscopy  03/10/2008   OEU:MPNTIR terminal ileum, approximately 10 cm visualized/Normal colon without evidence of polyps, mass, inflammatory changes, diverticula, or AVMs/Normal retroflexed view of the rectum. random colon bx negative.   MASTECTOMY     bilateral, status post reconstruction.   POLYPECTOMY  11/05/2017   Procedure: POLYPECTOMY;  Surgeon: Danie Binder, MD;  Location: [facility];  Service: Endoscopy;;  colon   TUBAL LIGATION      Family Psychiatric History: See  below  Family History:  Family History  Problem Relation Age of Onset   COPD Mother    Depression Mother    Heart disease Mother    Hyperlipidemia Mother    Hypertension Mother    Cancer Mother        lung cancer, to bone   Cancer Father        skin   Heart disease Father    Hypertension Father    Hyperlipidemia Father    Learning disabilities Father    Drug abuse Brother        overdose at 6   Drug abuse Daughter        drug overdose 44   Cancer Maternal Uncle        lung cancer   Colon cancer Neg Hx     Social History:  Social History   Socioeconomic History   Marital status: Divorced    Spouse name: Not on file   Number of children: 2   Years of education: 42   Highest education level: Not on file  Occupational History   Occupation: disabled    Comment: neuropathy from chemo  Tobacco Use   Smoking status: Every Day    Packs/day: 0.50    Years: 20.00    Total pack years: 10.00    Types: Cigarettes    Start date: 05/07/1992   Smokeless tobacco: Never   Tobacco comments:    discussed  Substance and Sexual Activity   Alcohol use: Yes    Comment: 1 beer a night.   Drug use: No   Sexual activity: Yes    Birth control/protection: Post-menopausal  Other Topics Concern   Not on file  Social History Narrative   Divorced   Lives alone   Spends time with boyfriend - he is a smoker   Social Determinants of Radio broadcast assistant Strain: Low Risk  (04/26/2021)   Overall Financial Resource Strain (CARDIA)    Difficulty of Paying Living Expenses: Not hard at all  Food Insecurity: No Food Insecurity (04/26/2021)   Hunger Vital Sign    Worried About Running Out of Food in the Last Year: Never true    [place] in the Last Year: Never true  Transportation Needs: No Transportation Needs (04/26/2021)   PRAPARE - Hydrologist (Medical): No    Lack of Transportation (Non-Medical): No  Physical Activity: Insufficiently Active  (04/26/2021)   Exercise Vital Sign    Days of Exercise per Week: 7 days    Minutes of Exercise per Session: 20 min  Stress: No Stress Concern Present (04/26/2021)   [place]    Feeling of Stress : Only a little  Social Connections: Moderately Isolated (04/26/2021)   Social Connection and Isolation Panel [NHANES]    Frequency of Communication with Friends and Family: More than three times a week    Frequency of Social Gatherings with Friends  and Family: Twice a week    Attends Religious Services: Never    Active Member of Clubs or Organizations: No    Attends Archivist Meetings: Never    Marital Status: Living with partner    Allergies:  Allergies  Allergen Reactions   Vicodin [Hydrocodone-Acetaminophen] Nausea Only    Metabolic Disorder Labs: No results found for: "HGBA1C", "MPG" No results found for: "PROLACTIN" Lab Results  Component Value Date   CHOL 172 12/18/2019   TRIG 110 12/18/2019   HDL 53 12/18/2019   CHOLHDL 3.2 12/18/2019   VLDL 42 (H) 11/23/2016   LDLCALC 99 12/18/2019   LDLCALC 103 (H) 07/08/2017   Lab Results  Component Value Date   TSH 3.110 01/17/2021   TSH 2.620 12/18/2019    Therapeutic Level Labs: No results found for: "LITHIUM" No results found for: "VALPROATE" No results found for: "CBMZ"  Current Medications: Current Outpatient Medications  Medication Sig Dispense Refill   albuterol (VENTOLIN HFA) 108 (90 Base) MCG/ACT inhaler INHALE 2 PUFFS BY MOUTH EVERY 6 HOURS AS NEEDED FOR WHEEZING FOR SHORTNESS OF BREATH 9 g 2   cefdinir (OMNICEF) 300 MG capsule Take 1 capsule (300 mg total) by mouth 2 (two) times daily. 1 po BID 20 capsule 0   cholecalciferol (VITAMIN D) 1000 units tablet Take 1,000 Units by mouth daily.     citalopram (CELEXA) 20 MG tablet Take 1 tablet (20 mg total) by mouth daily. 30 tablet 2   diazepam (VALIUM) 10 MG tablet Take 1 tablet (10 mg total)  by mouth 3 (three) times daily. 90 tablet 2   dicyclomine (BENTYL) 10 MG capsule Take 1 capsule (10 mg total) by mouth 3 (three) times daily before meals. 120 capsule 1   fexofenadine (ALLEGRA) 180 MG tablet Take 1 tablet (180 mg total) by mouth daily as needed for allergies or rhinitis. 90 tablet 3   fluticasone (FLOVENT HFA) 220 MCG/ACT inhaler Inhale 2 puffs by mouth twice daily (Patient not taking: Reported on 01/19/2022) 12 g 2   omeprazole (PRILOSEC) 40 MG capsule Take 1 capsule by mouth once daily 90 capsule 3   pravastatin (PRAVACHOL) 20 MG tablet Take 1 tablet (20 mg total) by mouth daily. 90 tablet 3   prazosin (MINIPRESS) 5 MG capsule Take 1 capsule (5 mg total) by mouth at bedtime. 30 capsule 2   temazepam (RESTORIL) 30 MG capsule Take 1 capsule (30 mg total) by mouth at bedtime as needed for sleep. 30 capsule 2   triamcinolone cream (KENALOG) 0.1 % APPLY 1 APPLICATION TOPICALLY TWICE DAILY UNTIL RASH CLEARS (7 TO 10 DAYS) 30 g 0   No current facility-administered medications for this visit.     Musculoskeletal: Strength & Muscle Tone: na Gait & Station: na Patient leans: N/A  Psychiatric Specialty Exam: Review of Systems  Constitutional:  Positive for appetite change.  Gastrointestinal:  Positive for diarrhea.  All other systems reviewed and are negative.   Last menstrual period 07/09/2013.There is no height or weight on file to calculate BMI.  General Appearance: NA  Eye Contact:  NA  Speech:  Clear and Coherent  Volume:  Normal  Mood:  Euthymic  Affect:  NA  Thought Process:  Goal Directed  Orientation:  Full (Time, Place, and Person)  Thought Content: WDL   Suicidal Thoughts:  No  Homicidal Thoughts:  No  Memory:  Immediate;   Good Recent;   Good Remote;   Fair  Judgement:  Good  Insight:  Good  Psychomotor Activity:  Normal  Concentration:  Concentration: Good and Attention Span: Good  Recall:  Good  Fund of Knowledge: Good  Language: Good  Akathisia:   No  Handed:  Right  AIMS (if indicated): not done  Assets:  Communication Skills Desire for Improvement Resilience Social Support Talents/Skills  ADL's:  Intact  Cognition: WNL  Sleep:  Good   Screenings: AUDIT    Flowsheet Row Clinical Support from 04/26/2021 in [place]  Alcohol Use Disorder Identification Test Final Score (AUDIT) 4      GAD-7    Flowsheet Row Office Visit from 01/10/2022 in [place] Office Visit from 05/31/2021 in [place] Office Visit from 01/17/2021 in [place] from 09/27/2020 in [facility]  Total GAD-7 Score '19 19 17 19      '$ PHQ2-9    [place] Office Visit from 01/10/2022 in [place] Video Visit from 12/06/2021 in [place] Video Visit from 09/05/2021 in [place] Video Visit from 06/28/2021 in [place] Office Visit from 05/31/2021 in [place]  PHQ-2 Total Score 0 0 0 1 6  PHQ-9 Total Score 15 -- -- -- 26      Flowsheet Row Video Visit from 12/06/2021 in [place] Video Visit from 09/05/2021 in [facility] Video Visit from 06/28/2021 in [place] No Risk No Risk No Risk        Assessment and Plan: This patient is a 61 year old female with a history of depression anxiety and PTSD.  She continues to do well in terms of mood and sleep.  She will continue Celexa 20 mg daily for depression, Valium 10 mg 3 times daily for anxiety, Restoril 30 mg at bedtime for sleep and prazosin 5 mg at bedtime to prevent nightmares.  She will return to see me in 3 months  Collaboration of  Care: Collaboration of Care: Primary Care Provider AEB 's are available to PCP through the epic system  Patient/Guardian was advised Release of Information must be obtained prior to any record release in order to collaborate their care with an outside provider. Patient/Guardian was advised if they have not already done so to contact the registration department to sign all necessary forms in order for [place] to release information regarding their care.   Consent: Patient/Guardian gives verbal consent for treatment and assignment of benefits for services provided during this visit. Patient/Guardian expressed understanding and agreed to proceed.    [name], MD 03/08/2022, 2:06 PM

## 2022-04-27 ENCOUNTER — Ambulatory Visit: Payer: Medicare Other

## 2022-04-27 NOTE — Progress Notes (Signed)
Patient cancelled to sudden death in family

## 2022-05-02 ENCOUNTER — Other Ambulatory Visit: Payer: Self-pay | Admitting: Gastroenterology

## 2022-05-02 DIAGNOSIS — R197 Diarrhea, unspecified: Secondary | ICD-10-CM

## 2022-05-02 DIAGNOSIS — R109 Unspecified abdominal pain: Secondary | ICD-10-CM

## 2022-06-06 ENCOUNTER — Other Ambulatory Visit: Payer: Self-pay | Admitting: Family Medicine

## 2022-06-06 DIAGNOSIS — J410 Simple chronic bronchitis: Secondary | ICD-10-CM

## 2022-06-06 DIAGNOSIS — R053 Chronic cough: Secondary | ICD-10-CM

## 2022-06-11 ENCOUNTER — Telehealth (INDEPENDENT_AMBULATORY_CARE_PROVIDER_SITE_OTHER): Payer: 59 | Admitting: Psychiatry

## 2022-06-11 ENCOUNTER — Encounter (HOSPITAL_COMMUNITY): Payer: Self-pay | Admitting: Psychiatry

## 2022-06-11 DIAGNOSIS — F4310 Post-traumatic stress disorder, unspecified: Secondary | ICD-10-CM

## 2022-06-11 DIAGNOSIS — F332 Major depressive disorder, recurrent severe without psychotic features: Secondary | ICD-10-CM

## 2022-06-11 DIAGNOSIS — F411 Generalized anxiety disorder: Secondary | ICD-10-CM

## 2022-06-11 MED ORDER — PRAZOSIN HCL 5 MG PO CAPS: 5.0000 mg | ORAL_CAPSULE | Freq: Every day | ORAL | 2 refills | Status: DC

## 2022-06-11 MED ORDER — DIAZEPAM 10 MG PO TABS: 10.0000 mg | ORAL_TABLET | Freq: Three times a day (TID) | ORAL | 2 refills | Status: DC

## 2022-06-11 MED ORDER — TEMAZEPAM 30 MG PO CAPS: 30.0000 mg | ORAL_CAPSULE | Freq: Every evening | ORAL | 2 refills | Status: DC | PRN

## 2022-06-11 MED ORDER — CITALOPRAM HYDROBROMIDE 20 MG PO TABS: 20.0000 mg | ORAL_TABLET | Freq: Every day | ORAL | 2 refills | Status: DC

## 2022-06-11 NOTE — Progress Notes (Signed)
Virtual Visit via Video Note  I connected with [name] on 06/11/22 at  2:20 PM EST by a video enabled telemedicine application and verified that I am speaking with the correct person using two identifiers.  Location: Patient: home Provider: office   I discussed the limitations of evaluation and management by telemedicine and the availability of in person appointments. The patient expressed understanding and agreed to proceed.     I discussed the assessment and treatment plan with the patient. The patient was provided an opportunity to ask questions and all were answered. The patient agreed with the plan and demonstrated an understanding of the instructions.   The patient was advised to call back or seek an in-person evaluation if the symptoms worsen or if the condition fails to improve as anticipated.  I provided 15 minutes of non-face-to-face time during this encounter.   Judy Spiller, MD  [facility] MD/PA/NP OP Progress Note  06/11/2022 2:44 PM [name]  MRN:  [id number]  Chief Complaint:  Chief Complaint  Patient presents with   Anxiety   Depression   Follow-up   HPI:  This patient is a 62 year old divorced white female who lives with her boyfriend in [place].  She has 1 grown son and 1 grandson.  She has a daughter who died in 2011/08/04 of a narcotic overdose.  She is on disability.  The patient returns for follow-up after 3 months regarding her depression and anxiety.  Overall she is doing fairly well.  She is still helping her son take care of his baby every other weekend.  She is enjoying this.  She denies significant depression or anxiety or thoughts of self-harm.  Recently she has been having more nightmares but I am reluctant to elevate the prazosin given that she sometimes feels dizzy when she wakes up at night.  I urged her to practice some positive affirmations before she goes to sleep and listen to something relaxing.  Visit Diagnosis:    ICD-10-CM   1. Major  depressive disorder, recurrent, severe without psychotic features ([place])  F33.2     2. Generalized anxiety disorder  F41.1     3. PTSD (post-traumatic stress disorder)  F43.10       Past Psychiatric History: Past outpatient treatment  Past Medical History:  Past Medical History:  Diagnosis Date   Allergy    Anxiety    Blood transfusion without reported diagnosis    Breast cancer ([place])    Breast cancer ([place]) 04/21/2012   Stage II (T1c N1 M0) grade 3 triple negative breast cancer, right- sided with 1 of 5 positive nodes, status post FEC in a dose dense fashion for 6 cycles followed by radiation therapy by Dr. Sondra Come for her high-risk disease with her initial date of surgery in February 2008.    Chemotherapy-induced neuropathy ([place]) 06/21/2016   COPD (chronic obstructive pulmonary disease) (HCC)    Depression    Emphysema of lung (HCC)    GERD (gastroesophageal reflux disease) 12/18/2012   Headache    Hyperlipidemia    Neuropathy    Osteoporosis    PONV (postoperative nausea and vomiting)    PTSD (post-traumatic stress disorder)    Substance abuse [facility])     Past Surgical History:  Procedure Laterality Date   BIOPSY  11/05/2017   Procedure: BIOPSY;  Surgeon: Danie Binder, MD;  Location: [facility] ENDO SUITE;  Service: Endoscopy;;  colon duodenum gastric   BREAST SURGERY     CATARACT EXTRACTION W/PHACO  Left 08/15/2017   Procedure: CATARACT EXTRACTION WITH PHACOEMULSIFICATION  AND INTRAOCULAR LENS PLACEMENT LEFT EYE CDE=2.68;  Surgeon: Baruch Goldmann, MD;  Location: [facility] ORS;  Service: Ophthalmology;  Laterality: Left;  left   CATARACT EXTRACTION W/PHACO Right 09/06/2017   Procedure: CATARACT EXTRACTION PHACO AND INTRAOCULAR LENS PLACEMENT (IOC);  Surgeon: Baruch Goldmann, MD;  Location: [facility] ORS;  Service: Ophthalmology;  Laterality: Right;  CDE: 1.61   COLONOSCOPY WITH PROPOFOL N/A 11/05/2017    6 mm polyp in the sigmoid colon which was sessile and removed.  Rectosigmoid colon and sigmoid colon  biopsies taken for evaluation for microscopic colitis.  Intermittent rectal bleeding due to sigmoid colon polyp and internal hemorrhoids. Normal colonic biopsies. Tubular adenoma. Repeat surveillance 5-10 years.    ESOPHAGOGASTRODUODENOSCOPY  03/10/2008   SLF: Normal esophagus without evidence of Barrett, mass, erosion  ulceration, or stricture, small bowel bx negative, gastritis with NO h.pylori   ESOPHAGOGASTRODUODENOSCOPY (EGD) WITH PROPOFOL N/A 11/05/2017   Mild gastritis and duodenitis.    leocolonoscopy  03/10/2008   WUJ:WJXBJY terminal ileum, approximately 10 cm visualized/Normal colon without evidence of polyps, mass, inflammatory changes, diverticula, or AVMs/Normal retroflexed view of the rectum. random colon bx negative.   MASTECTOMY     bilateral, status post reconstruction.   POLYPECTOMY  11/05/2017   Procedure: POLYPECTOMY;  Surgeon: Danie Binder, MD;  Location: [facility];  Service: Endoscopy;;  colon   TUBAL LIGATION      Family Psychiatric History: See below  Family History:  Family History  Problem Relation Age of Onset   COPD Mother    Depression Mother    Heart disease Mother    Hyperlipidemia Mother    Hypertension Mother    Cancer Mother        lung cancer, to bone   Cancer Father        skin   Heart disease Father    Hypertension Father    Hyperlipidemia Father    Learning disabilities Father    Drug abuse Brother        overdose at 62   Drug abuse Daughter        drug overdose 35   Cancer Maternal Uncle        lung cancer   Colon cancer Neg Hx     Social History:  Social History   Socioeconomic History   Marital status: Divorced    Spouse name: Not on file   Number of children: 2   Years of education: 51   Highest education level: Not on file  Occupational History   Occupation: disabled    Comment: neuropathy from chemo  Tobacco Use   Smoking status: Every Day    Packs/day: 0.50    Years: 20.00    Total pack years: 10.00    Types:  Cigarettes    Start date: 05/07/1992   Smokeless tobacco: Never   Tobacco comments:    discussed  Substance and Sexual Activity   Alcohol use: Yes    Comment: 1 beer a night.   Drug use: No   Sexual activity: Yes    Birth control/protection: Post-menopausal  Other Topics Concern   Not on file  Social History Narrative   Divorced   Lives alone   Spends time with boyfriend - he is a smoker   Social Determinants of Radio broadcast assistant Strain: Low Risk  (04/26/2021)   Overall Financial Resource Strain (CARDIA)    Difficulty of Paying Living Expenses: Not hard  at all  Food Insecurity: No Food Insecurity (04/26/2021)   Hunger Vital Sign    Worried About Running Out of Food in the Last Year: Never true    Ran Out of Food in the Last Year: Never true  Transportation Needs: No Transportation Needs (04/26/2021)   PRAPARE - Hydrologist (Medical): No    Lack of Transportation (Non-Medical): No  Physical Activity: Insufficiently Active (04/26/2021)   Exercise Vital Sign    Days of Exercise per Week: 7 days    Minutes of Exercise per Session: 20 min  Stress: No Stress Concern Present (04/26/2021)   [place]    Feeling of Stress : Only a little  Social Connections: Moderately Isolated (04/26/2021)   Social Connection and Isolation Panel [NHANES]    Frequency of Communication with Friends and Family: More than three times a week    Frequency of Social Gatherings with Friends and Family: Twice a week    Attends Religious Services: Never    Marine scientist or Organizations: No    Attends Archivist Meetings: Never    Marital Status: Living with partner    Allergies:  Allergies  Allergen Reactions   Vicodin [Hydrocodone-Acetaminophen] Nausea Only    Metabolic Disorder Labs: No results found for: "HGBA1C", "MPG" No results found for: "PROLACTIN" Lab Results   Component Value Date   CHOL 172 12/18/2019   TRIG 110 12/18/2019   HDL 53 12/18/2019   CHOLHDL 3.2 12/18/2019   VLDL 42 (H) 11/23/2016   LDLCALC 99 12/18/2019   LDLCALC 103 (H) 07/08/2017   Lab Results  Component Value Date   TSH 3.110 01/17/2021   TSH 2.620 12/18/2019    Therapeutic Level Labs: No results found for: "LITHIUM" No results found for: "VALPROATE" No results found for: "CBMZ"  Current Medications: Current Outpatient Medications  Medication Sig Dispense Refill   albuterol (VENTOLIN HFA) 108 (90 Base) MCG/ACT inhaler INHALE 2 PUFFS BY MOUTH EVERY 6 HOURS AS NEEDED FOR WHEEZING FOR SHORTNESS OF BREATH 9 g 2   cefdinir (OMNICEF) 300 MG capsule Take 1 capsule (300 mg total) by mouth 2 (two) times daily. 1 po BID 20 capsule 0   cholecalciferol (VITAMIN D) 1000 units tablet Take 1,000 Units by mouth daily.     citalopram (CELEXA) 20 MG tablet Take 1 tablet (20 mg total) by mouth daily. 30 tablet 2   diazepam (VALIUM) 10 MG tablet Take 1 tablet (10 mg total) by mouth 3 (three) times daily. 90 tablet 2   dicyclomine (BENTYL) 10 MG capsule TAKE 1 CAPSULE BY MOUTH THREE TIMES DAILY BEFORE MEAL(S) 120 capsule 0   fexofenadine (ALLEGRA) 180 MG tablet Take 1 tablet (180 mg total) by mouth daily as needed for allergies or rhinitis. 90 tablet 3   fluticasone (FLOVENT HFA) 220 MCG/ACT inhaler Inhale 2 puffs by mouth twice daily (Patient not taking: Reported on 01/19/2022) 12 g 2   omeprazole (PRILOSEC) 40 MG capsule Take 1 capsule by mouth once daily 90 capsule 3   pravastatin (PRAVACHOL) 20 MG tablet Take 1 tablet (20 mg total) by mouth daily. 90 tablet 3   prazosin (MINIPRESS) 5 MG capsule Take 1 capsule (5 mg total) by mouth at bedtime. 30 capsule 2   temazepam (RESTORIL) 30 MG capsule Take 1 capsule (30 mg total) by mouth at bedtime as needed for sleep. 30 capsule 2   triamcinolone cream (KENALOG) 0.1 %  APPLY 1 APPLICATION TOPICALLY TWICE DAILY UNTIL RASH CLEARS (7 TO 10 DAYS) 30  g 0   No current facility-administered medications for this visit.     Musculoskeletal: Strength & Muscle Tone: within normal limits Gait & Station: normal Patient leans: N/A  Psychiatric Specialty Exam: Review of Systems  Psychiatric/Behavioral:  Positive for sleep disturbance.   All other systems reviewed and are negative.   Last menstrual period 07/09/2013.There is no height or weight on file to calculate BMI.  General Appearance: Casual and Fairly Groomed  Eye Contact:  Good  Speech:  Clear and Coherent  Volume:  Normal  Mood:  Euthymic  Affect:  Congruent  Thought Process:  Goal Directed  Orientation:  Full (Time, Place, and Person)  Thought Content: WDL   Suicidal Thoughts:  No  Homicidal Thoughts:  No  Memory:  Immediate;   Good Recent;   Good Remote;   Fair  Judgement:  Good  Insight:  Good  Psychomotor Activity:  Normal  Concentration:  Concentration: Good and Attention Span: Good  Recall:  Good  Fund of Knowledge: Good  Language: Good  Akathisia:  No  Handed:  Right  AIMS (if indicated): not done  Assets:  Communication Skills Desire for Improvement Physical Health Resilience Social Support Talents/Skills  ADL's:  Intact  Cognition: WNL  Sleep:  Good   Screenings: AUDIT    Flowsheet Row Clinical Support from 04/26/2021 in [facility]  Alcohol Use Disorder Identification Test Final Score (AUDIT) 4      GAD-7    Flowsheet Row Office Visit from 01/10/2022 in [facility] Office Visit from 05/31/2021 in [facility] Office Visit from 01/17/2021 in [facility] Counselor from 09/27/2020 in [facility] at [place]  Total GAD-7 Score '19 19 17 19      '$ PHQ2-9    [place] Office Visit from 01/10/2022 in [place] Video Visit from 12/06/2021 in [facility] [place] Video Visit from 09/05/2021 in [place] at [place] Video Visit from 06/28/2021 in [place] [place] Visit from 05/31/2021 in [facility]  PHQ-2 Total Score 0 0 0 1 6  PHQ-9 Total Score 15 -- -- -- 26      Flowsheet Row Video Visit from 12/06/2021 in [facility] at [place] Video Visit from 09/05/2021 in [facility] at [place] Video Visit from 06/28/2021 in [facility] [facility] at [place] No Risk No Risk No Risk        Assessment and Plan: This patient is a 62 year old female with a history of depression anxiety and PTSD for the most part she is doing well.  She will continue Celexa 20 mg daily for depression, Valium 10 mg 3 times daily for anxiety, Restoril 30 mg at bedtime for sleep and prazosin 5 mg at bedtime to prevent nightmares.  She will return to see me in 3 months  Collaboration of Care: Collaboration of Care: Primary Care Provider AEB notes are shared with PCP through the epic system  Patient/Guardian was advised Release of Information must be obtained prior to any record release in order to collaborate their care with an outside provider. Patient/Guardian was advised if they have not already done so to contact the registration  department to sign all necessary forms in order for [place] to release information regarding their care.   Consent: Patient/Guardian gives verbal consent for treatment and assignment of benefits for services provided during this visit. Patient/Guardian expressed understanding and agreed to proceed.    Judy Spiller, MD 06/11/2022, 2:44 PM

## 2022-06-12 ENCOUNTER — Ambulatory Visit (INDEPENDENT_AMBULATORY_CARE_PROVIDER_SITE_OTHER): Payer: 59 | Admitting: Nurse Practitioner

## 2022-06-12 ENCOUNTER — Encounter: Payer: Self-pay | Admitting: Nurse Practitioner

## 2022-06-12 VITALS — BP 107/80 | HR 88 | Temp 98.7°F | Ht 64.0 in | Wt 147.0 lb

## 2022-06-12 DIAGNOSIS — M7989 Other specified soft tissue disorders: Secondary | ICD-10-CM

## 2022-06-12 NOTE — Progress Notes (Signed)
   Subjective:    Patient ID: [name], female    DOB: [birth year], 62 y.o.   MRN: [id number]   Chief Complaint: Mass   HPI Patient noticed a lump on left butt cheek about 3 weeks ago. No bigger.no drainage.    Review of Systems  Constitutional:  Negative for diaphoresis.  Eyes:  Negative for pain.  Respiratory:  Negative for shortness of breath.   Cardiovascular:  Negative for chest pain, palpitations and leg swelling.  Gastrointestinal:  Negative for abdominal pain.  Endocrine: Negative for polydipsia.  Skin:  Negative for rash.  Neurological:  Negative for dizziness, weakness and headaches.  Hematological:  Does not bruise/bleed easily.  All other systems reviewed and are negative.      Objective:   Physical Exam Vitals reviewed.  Constitutional:      Appearance: Normal appearance.  Cardiovascular:     Rate and Rhythm: Normal rate and regular rhythm.     Heart sounds: Normal heart sounds.  Pulmonary:     Breath sounds: Normal breath sounds.  Genitourinary:    Comments: 5cm solid  nontender nodule left buttocks- no erythema Skin:    General: Skin is warm.  Neurological:     General: No focal deficit present.     Mental Status: She is alert and oriented to person, place, and time.  Psychiatric:        Mood and Affect: Mood normal.        Behavior: Behavior normal.    BP 107/80   Pulse 88   Temp 98.7 F (37.1 C)   Ht '5\' 4"'$  (1.626 m)   Wt 147 lb (66.7 kg)   LMP 07/09/2013   SpO2 96%   BMI 25.23 kg/m         Assessment & Plan:   [name] in today with chief complaint of Mass   1. Nodule of soft tissue Will talk once results are back - [place] MiscellaneoUS Localization; Future    The above assessment and management plan was discussed with the patient. The patient verbalized understanding of and has agreed to the management plan. Patient is aware to call the clinic if symptoms persist or worsen. Patient is aware when to return to the  clinic for a follow-up visit. Patient educated on when it is appropriate to go to the emergency department.   Mary-Margaret Hassell Done, FNP

## 2022-06-12 NOTE — Addendum Note (Signed)
Addended by: Chevis Pretty on: 06/12/2022 03:30 PM   Modules accepted: Level of Service

## 2022-06-14 ENCOUNTER — Other Ambulatory Visit: Payer: Self-pay | Admitting: Nurse Practitioner

## 2022-06-14 DIAGNOSIS — M7989 Other specified soft tissue disorders: Secondary | ICD-10-CM

## 2022-06-14 NOTE — Progress Notes (Signed)
U/s reordererd

## 2022-06-21 ENCOUNTER — Ambulatory Visit (HOSPITAL_COMMUNITY)
Admission: RE | Admit: 2022-06-21 | Discharge: 2022-06-21 | Disposition: A | Discharge status: Home / Self Care | Payer: 59 | Source: Ambulatory Visit | Attending: Nurse Practitioner | Admitting: Nurse Practitioner

## 2022-06-21 DIAGNOSIS — M7989 Other specified soft tissue disorders: Secondary | ICD-10-CM | POA: Diagnosis not present

## 2022-06-21 DIAGNOSIS — R1909 Other intra-abdominal and pelvic swelling, mass and lump: Secondary | ICD-10-CM | POA: Diagnosis not present

## 2022-07-05 ENCOUNTER — Other Ambulatory Visit (HOSPITAL_COMMUNITY): Payer: Self-pay | Admitting: Psychiatry

## 2022-08-07 NOTE — Addendum Note (Signed)
Addended by: Karle Barr on: 08/07/2022 02:27 PM   Modules accepted: Level of Service

## 2022-09-10 ENCOUNTER — Encounter (HOSPITAL_COMMUNITY): Payer: Self-pay | Admitting: Psychiatry

## 2022-09-10 ENCOUNTER — Telehealth (INDEPENDENT_AMBULATORY_CARE_PROVIDER_SITE_OTHER): Payer: Medicare HMO | Admitting: Psychiatry

## 2022-09-10 DIAGNOSIS — F4310 Post-traumatic stress disorder, unspecified: Secondary | ICD-10-CM

## 2022-09-10 DIAGNOSIS — F332 Major depressive disorder, recurrent severe without psychotic features: Secondary | ICD-10-CM

## 2022-09-10 DIAGNOSIS — F411 Generalized anxiety disorder: Secondary | ICD-10-CM

## 2022-09-10 MED ORDER — CITALOPRAM HYDROBROMIDE 20 MG PO TABS: 20.0000 mg | ORAL_TABLET | Freq: Every day | ORAL | 2 refills | Status: DC

## 2022-09-10 MED ORDER — TEMAZEPAM 30 MG PO CAPS: 30.0000 mg | ORAL_CAPSULE | Freq: Every evening | ORAL | 2 refills | Status: DC | PRN

## 2022-09-10 MED ORDER — DIAZEPAM 10 MG PO TABS: 10.0000 mg | ORAL_TABLET | Freq: Three times a day (TID) | ORAL | 2 refills | Status: DC

## 2022-09-10 MED ORDER — PRAZOSIN HCL 2 MG PO CAPS: 2.0000 mg | ORAL_CAPSULE | Freq: Every day | ORAL | 2 refills | Status: DC

## 2022-09-10 NOTE — Progress Notes (Signed)
Virtual Visit via Telephone Note  I connected with Barbara Cower on 09/10/22 at  1:00 PM EDT by telephone and verified that I am speaking with the correct person using two identifiers.  Location: Patient: home Provider: office   I discussed the limitations, risks, security and privacy concerns of performing an evaluation and management service by telephone and the availability of in person appointments. I also discussed with the patient that there may be a patient responsible charge related to this service. The patient expressed understanding and agreed to proceed.      I discussed the assessment and treatment plan with the patient. The patient was provided an opportunity to ask questions and all were answered. The patient agreed with the plan and demonstrated an understanding of the instructions.   The patient was advised to call back or seek an in-person evaluation if the symptoms worsen or if the condition fails to improve as anticipated.  I provided 15 minutes of non-face-to-face time during this encounter.   Diannia Ruder, MD  [facility] MD/PA/NP OP Progress Note  09/10/2022 1:25 PM Barbara Cower  MRN:  [id number]  Chief Complaint:  Chief Complaint  Patient presents with   Depression   Anxiety   Follow-up   HPI:  This patient is a 62 year old divorced white female who lives with her boyfriend in [place].  She has 1 grown son and 1 grandson.  She has a daughter who died in 12-Oct-2011 of a narcotic overdose.  She is on disability.   The patient returns for follow-up after 3 months regarding her depression and anxiety.  She continues to do well.  At 1 point we had increased her prazosin to eliminate nightmares but now she feels dizzy in the mornings.  I explained that we would need to cut it back.  She also takes Valium with the prazosin and temazepam and I explained that this is way too much sedation.  She denies significant depression or anxiety or thoughts of self-harm.  She is  enjoying time with her grandson. Visit Diagnosis:    ICD-10-CM   1. Major depressive disorder, recurrent, severe without psychotic features (HCC)  F33.2     2. Generalized anxiety disorder  F41.1     3. PTSD (post-traumatic stress disorder)  F43.10       Past Psychiatric History: Past outpatient treatment  Past Medical History:  Past Medical History:  Diagnosis Date   Allergy    Anxiety    Blood transfusion without reported diagnosis    Breast cancer (HCC)    Breast cancer (HCC) 04/21/2012   Stage II (T1c N1 M0) grade 3 triple negative breast cancer, right- sided with 1 of 5 positive nodes, status post FEC in a dose dense fashion for 6 cycles followed by radiation therapy by Dr. Roselind Messier for her high-risk disease with her initial date of surgery in February 2008.    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   COPD (chronic obstructive pulmonary disease) (HCC)    Depression    Emphysema of lung (HCC)    GERD (gastroesophageal reflux disease) 12/18/2012   Headache    Hyperlipidemia    Neuropathy    Osteoporosis    PONV (postoperative nausea and vomiting)    PTSD (post-traumatic stress disorder)    Substance abuse [facility])     Past Surgical History:  Procedure Laterality Date   BIOPSY  11/05/2017   Procedure: BIOPSY;  Surgeon: West Bali, MD;  Location: [facility];  Service: Endoscopy;;  colon duodenum gastric   BREAST SURGERY     CATARACT EXTRACTION W/PHACO Left 08/15/2017   Procedure: CATARACT EXTRACTION WITH PHACOEMULSIFICATION  AND INTRAOCULAR LENS PLACEMENT LEFT EYE CDE=2.68;  Surgeon: Fabio Pierce, MD;  Location: [facility] ORS;  Service: Ophthalmology;  Laterality: Left;  left   CATARACT EXTRACTION W/PHACO Right 09/06/2017   Procedure: CATARACT EXTRACTION PHACO AND INTRAOCULAR LENS PLACEMENT (IOC);  Surgeon: Fabio Pierce, MD;  Location: [facility] ORS;  Service: Ophthalmology;  Laterality: Right;  CDE: 1.61   COLONOSCOPY WITH PROPOFOL N/A 11/05/2017    6 mm polyp in the  sigmoid colon which was sessile and removed.  Rectosigmoid colon and sigmoid colon biopsies taken for evaluation for microscopic colitis.  Intermittent rectal bleeding due to sigmoid colon polyp and internal hemorrhoids. Normal colonic biopsies. Tubular adenoma. Repeat surveillance 5-10 years.    ESOPHAGOGASTRODUODENOSCOPY  03/10/2008   SLF: Normal esophagus without evidence of Barrett, mass, erosion  ulceration, or stricture, small bowel bx negative, gastritis with NO h.pylori   ESOPHAGOGASTRODUODENOSCOPY (EGD) WITH PROPOFOL N/A 11/05/2017   Mild gastritis and duodenitis.    leocolonoscopy  03/10/2008   ZOX:WRUEAV terminal ileum, approximately 10 cm visualized/Normal colon without evidence of polyps, mass, inflammatory changes, diverticula, or AVMs/Normal retroflexed view of the rectum. random colon bx negative.   MASTECTOMY     bilateral, status post reconstruction.   POLYPECTOMY  11/05/2017   Procedure: POLYPECTOMY;  Surgeon: West Bali, MD;  Location: [facility];  Service: Endoscopy;;  colon   TUBAL LIGATION      Family Psychiatric History: See below  Family History:  Family History  Problem Relation Age of Onset   COPD Mother    Depression Mother    Heart disease Mother    Hyperlipidemia Mother    Hypertension Mother    Cancer Mother        lung cancer, to bone   Cancer Father        skin   Heart disease Father    Hypertension Father    Hyperlipidemia Father    Learning disabilities Father    Drug abuse Brother        overdose at 66   Drug abuse Daughter        drug overdose 32   Cancer Maternal Uncle        lung cancer   Colon cancer Neg Hx     Social History:  Social History   Socioeconomic History   Marital status: Divorced    Spouse name: Not on file   Number of children: 2   Years of education: 13   Highest education level: Not on file  Occupational History   Occupation: disabled    Comment: neuropathy from chemo  Tobacco Use   Smoking status: Every  Day    Packs/day: 0.50    Years: 20.00    Additional pack years: 0.00    Total pack years: 10.00    Types: Cigarettes    Start date: 05/07/1992   Smokeless tobacco: Never   Tobacco comments:    discussed  Substance and Sexual Activity   Alcohol use: Yes    Comment: 1 beer a night.   Drug use: No   Sexual activity: Yes    Birth control/protection: Post-menopausal  Other Topics Concern   Not on file  Social History Narrative   Divorced   Lives alone   Spends time with boyfriend - he is a smoker   Chemical engineer Strain:  Low Risk  (04/26/2021)   Overall Financial Resource Strain (CARDIA)    Difficulty of Paying Living Expenses: Not hard at all  Food Insecurity: No Food Insecurity (04/26/2021)   Hunger Vital Sign    Worried About Running Out of Food in the Last Year: Never true    Ran Out of Food in the Last Year: Never true  Transportation Needs: No Transportation Needs (04/26/2021)   PRAPARE - Administrator, Civil Service (Medical): No    Lack of Transportation (Non-Medical): No  Physical Activity: Insufficiently Active (04/26/2021)   Exercise Vital Sign    Days of Exercise per Week: 7 days    Minutes of Exercise per Session: 20 min  Stress: No Stress Concern Present (04/26/2021)   [employer] of Occupational Health - Occupational Stress Questionnaire    Feeling of Stress : Only a little  Social Connections: Moderately Isolated (04/26/2021)   Social Connection and Isolation Panel [NHANES]    Frequency of Communication with Friends and Family: More than three times a week    Frequency of Social Gatherings with Friends and Family: Twice a week    Attends Religious Services: Never    Database administrator or Organizations: No    Attends Banker Meetings: Never    Marital Status: Living with partner    Allergies:  Allergies  Allergen Reactions   Vicodin [Hydrocodone-Acetaminophen] Nausea Only     Metabolic Disorder Labs: No results found for: "HGBA1C", "MPG" No results found for: "PROLACTIN" Lab Results  Component Value Date   CHOL 172 12/18/2019   TRIG 110 12/18/2019   HDL 53 12/18/2019   CHOLHDL 3.2 12/18/2019   VLDL 42 (H) 11/23/2016   LDLCALC 99 12/18/2019   LDLCALC 103 (H) 07/08/2017   Lab Results  Component Value Date   TSH 3.110 01/17/2021   TSH 2.620 12/18/2019    Therapeutic Level Labs: No results found for: "LITHIUM" No results found for: "VALPROATE" No results found for: "CBMZ"  Current Medications: Current Outpatient Medications  Medication Sig Dispense Refill   prazosin (MINIPRESS) 2 MG capsule Take 1 capsule (2 mg total) by mouth at bedtime. 30 capsule 2   albuterol (VENTOLIN HFA) 108 (90 Base) MCG/ACT inhaler INHALE 2 PUFFS BY MOUTH EVERY 6 HOURS AS NEEDED FOR WHEEZING FOR SHORTNESS OF BREATH 9 g 2   cefdinir (OMNICEF) 300 MG capsule Take 1 capsule (300 mg total) by mouth 2 (two) times daily. 1 po BID 20 capsule 0   cholecalciferol (VITAMIN D) 1000 units tablet Take 1,000 Units by mouth daily.     citalopram (CELEXA) 20 MG tablet Take 1 tablet (20 mg total) by mouth daily. 30 tablet 2   diazepam (VALIUM) 10 MG tablet Take 1 tablet (10 mg total) by mouth 3 (three) times daily. 90 tablet 2   dicyclomine (BENTYL) 10 MG capsule TAKE 1 CAPSULE BY MOUTH THREE TIMES DAILY BEFORE MEAL(S) 120 capsule 0   fexofenadine (ALLEGRA) 180 MG tablet Take 1 tablet (180 mg total) by mouth daily as needed for allergies or rhinitis. 90 tablet 3   fluticasone (FLOVENT HFA) 220 MCG/ACT inhaler Inhale 2 puffs by mouth twice daily 12 g 2   omeprazole (PRILOSEC) 40 MG capsule Take 1 capsule by mouth once daily 90 capsule 3   pravastatin (PRAVACHOL) 20 MG tablet Take 1 tablet (20 mg total) by mouth daily. 90 tablet 3   temazepam (RESTORIL) 30 MG capsule Take 1 capsule (30 mg total) by mouth at  bedtime as needed for sleep. 30 capsule 2   triamcinolone cream (KENALOG) 0.1 %  APPLY 1 APPLICATION TOPICALLY TWICE DAILY UNTIL RASH CLEARS (7 TO 10 DAYS) 30 g 0   No current facility-administered medications for this visit.     Musculoskeletal: Strength & Muscle Tone: na Gait & Station: na Patient leans: N/A  Psychiatric Specialty Exam: Review of Systems  Neurological:  Positive for light-headedness.  All other systems reviewed and are negative.   Last menstrual period 07/09/2013.There is no height or weight on file to calculate BMI.  General Appearance: NA  Eye Contact:  NA  Speech:  Clear and Coherent  Volume:  Normal  Mood:  Euthymic  Affect:  Congruent  Thought Process:  Goal Directed  Orientation:  Full (Time, Place, and Person)  Thought Content: WDL   Suicidal Thoughts:  No  Homicidal Thoughts:  No  Memory:  Immediate;   Good Recent;   Good Remote;   NA  Judgement:  Good  Insight:  Good  Psychomotor Activity:  Normal  Concentration:  Concentration: Good and Attention Span: Good  Recall:  Good  Fund of Knowledge: Good  Language: Good  Akathisia:  No  Handed:  Right  AIMS (if indicated): not done  Assets:  Communication Skills Desire for Improvement Resilience Social Support  ADL's:  Intact  Cognition: WNL  Sleep:  Fair   Screenings: AUDIT    Flowsheet Row Clinical Support from 04/26/2021 in [facility]  Alcohol Use Disorder Identification Test Final Score (AUDIT) 4      GAD-7    Flowsheet Row Office Visit from 01/10/2022 in [facility] Office Visit from 05/31/2021 in [facility] Office Visit from 01/17/2021 in [facility] Counselor from 09/27/2020 in [facility] [place]  Total GAD-7 Score 19 19 17 19       PHQ2-9    Flowsheet Row Office Visit from 01/10/2022 in [facility] Video Visit from 12/06/2021 in [facility] [place] Video Visit from 09/05/2021 in [facility] [place] Video Visit from 06/28/2021 in [facility] [place] Office Visit from 05/31/2021 in [facility]  PHQ-2 Total Score 0 0 0 1 6  PHQ-9 Total Score 15 -- -- -- 26      Flowsheet Row Video Visit from 12/06/2021 in [facility] [place] Video Visit from 09/05/2021 in [facility] [place] Video Visit from 06/28/2021 in [facility] at [place]  C-SSRS RISK CATEGORY No Risk No Risk No Risk        Assessment and Plan: This patient is a 62 year old female with a history of depression anxiety and PTSD.  She is to oversedated and dizzy from the medications at bedtime.  We will therefore cut prazosin down to 2 mg at bedtime to prevent nightmares.  She can continue the Restoril 30 mg at bedtime for sleep.  I explained that she is not to take the Valium at bedtime but she can take 10 mg 3 times during the day but try to limit it to 2 times.  She will continue Celexa 20 mg daily for depression.  She will return to see me in 3 months  Collaboration of Care: Collaboration of Care: Primary Care Provider AEB notes are shared with  PCP through the epic system  Patient/Guardian was advised Release of Information must be obtained prior to any record release in order to collaborate their care with an outside provider. Patient/Guardian was advised if they have not already done so to contact the registration department to sign all necessary forms in order for [place] to release information regarding their care.   Consent: Patient/Guardian gives verbal consent for treatment and assignment of benefits for services provided during this visit. Patient/Guardian expressed understanding and agreed to proceed.    Diannia Ruder, MD 09/10/2022, 1:25 PM

## 2022-09-14 ENCOUNTER — Encounter: Payer: Self-pay | Admitting: Family

## 2022-09-14 ENCOUNTER — Telehealth (INDEPENDENT_AMBULATORY_CARE_PROVIDER_SITE_OTHER): Payer: Medicare HMO | Admitting: Family

## 2022-09-14 DIAGNOSIS — J019 Acute sinusitis, unspecified: Secondary | ICD-10-CM | POA: Diagnosis not present

## 2022-09-14 MED ORDER — AMOXICILLIN-POT CLAVULANATE 875-125 MG PO TABS: 1.0000 | ORAL_TABLET | Freq: Two times a day (BID) | ORAL | 0 refills | Status: DC

## 2022-09-14 NOTE — Progress Notes (Signed)
Virtual Visit Consent   [name], you are scheduled for a virtual visit with a [place] provider today. Just as with appointments in the office, your consent must be obtained to participate. Your consent will be active for this visit and any virtual visit you may have with one of our providers in the next 365 days. If you have a MyChart account, a copy of this consent can be sent to you electronically.  As this is a virtual visit, video technology does not allow for your provider to perform a traditional examination. This may limit your provider's ability to fully assess your condition. If your provider identifies any concerns that need to be evaluated in person or the need to arrange testing (such as labs, EKG, etc.), we will make arrangements to do so. Although advances in technology are sophisticated, we cannot ensure that it will always work on either your end or our end. If the connection with a video visit is poor, the visit may have to be switched to a telephone visit. With either a video or telephone visit, we are not always able to ensure that we have a secure connection.  By engaging in this virtual visit, you consent to the provision of healthcare and authorize for your insurance to be billed (if applicable) for the services provided during this visit. Depending on your insurance coverage, you may receive a charge related to this service.  I need to obtain your verbal consent now. Are you willing to proceed with your visit today? [name] has provided verbal consent on 09/14/2022 for a virtual visit (video or telephone). Jannifer Rodney, FNP  Date: 09/14/2022 1:26 PM  Virtual Visit via Video Note   I, Jannifer Rodney, connected with  [name]  ([id number], 1961/03/06) on 09/14/22 at  6:00 PM EDT by a video-enabled telemedicine application and verified that I am speaking with the correct person using two identifiers.  Location: Patient: Virtual Visit Location Patient:  Home Provider: Virtual Visit Location Provider: Office/Clinic   I discussed the limitations of evaluation and management by telemedicine and the availability of in person appointments. The patient expressed understanding and agreed to proceed.    History of Present Illness: [name] is a 62 y.o. who identifies as a female who was assigned female at birth, and is being seen today for sinus congestion that started last week.  HPI: Sinusitis This is a new problem. The current episode started 1 to 4 weeks ago. The problem has been gradually worsening since onset. There has been no fever. Her pain is at a severity of 10/10. The pain is mild. Associated symptoms include congestion, coughing, headaches, shortness of breath, sinus pressure, sneezing and a sore throat. Pertinent negatives include no ear pain. Past treatments include acetaminophen. The treatment provided mild relief.    Problems:  Patient Active Problem List   Diagnosis Date Noted   Abdominal cramping 01/19/2022   Loss of weight 01/19/2022   Diarrhea 09/03/2017   Rectal bleeding 09/03/2017   Melena 09/03/2017   Eczema 09/02/2017   Prediabetes 12/14/2016   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   COPD mixed type (HCC) 06/21/2016   HLD (hyperlipidemia) 06/21/2016   Vitamin D deficiency 06/21/2016   Post traumatic stress disorder (PTSD) 06/21/2016   Angular cheilitis 06/21/2016   Major depression 02/05/2014   Tobacco abuse 12/18/2012   GERD (gastroesophageal reflux disease) 12/18/2012   Breast cancer (HCC) 04/21/2012    Allergies:  Allergies  Allergen Reactions  Vicodin [Hydrocodone-Acetaminophen] Nausea Only   Medications:  Current Outpatient Medications:    amoxicillin-clavulanate (AUGMENTIN) 875-125 MG tablet, Take 1 tablet by mouth 2 (two) times daily., Disp: 14 tablet, Rfl: 0   albuterol (VENTOLIN HFA) 108 (90 Base) MCG/ACT inhaler, INHALE 2 PUFFS BY MOUTH EVERY 6 HOURS AS NEEDED FOR WHEEZING FOR  SHORTNESS OF BREATH, Disp: 9 g, Rfl: 2   cholecalciferol (VITAMIN D) 1000 units tablet, Take 1,000 Units by mouth daily., Disp: , Rfl:    citalopram (CELEXA) 20 MG tablet, Take 1 tablet (20 mg total) by mouth daily., Disp: 30 tablet, Rfl: 2   diazepam (VALIUM) 10 MG tablet, Take 1 tablet (10 mg total) by mouth 3 (three) times daily., Disp: 90 tablet, Rfl: 2   dicyclomine (BENTYL) 10 MG capsule, TAKE 1 CAPSULE BY MOUTH THREE TIMES DAILY BEFORE MEAL(S), Disp: 120 capsule, Rfl: 0   fexofenadine (ALLEGRA) 180 MG tablet, Take 1 tablet (180 mg total) by mouth daily as needed for allergies or rhinitis., Disp: 90 tablet, Rfl: 3   fluticasone (FLOVENT HFA) 220 MCG/ACT inhaler, Inhale 2 puffs by mouth twice daily, Disp: 12 g, Rfl: 2   omeprazole (PRILOSEC) 40 MG capsule, Take 1 capsule by mouth once daily, Disp: 90 capsule, Rfl: 3   pravastatin (PRAVACHOL) 20 MG tablet, Take 1 tablet (20 mg total) by mouth daily., Disp: 90 tablet, Rfl: 3   prazosin (MINIPRESS) 2 MG capsule, Take 1 capsule (2 mg total) by mouth at bedtime., Disp: 30 capsule, Rfl: 2   temazepam (RESTORIL) 30 MG capsule, Take 1 capsule (30 mg total) by mouth at bedtime as needed for sleep., Disp: 30 capsule, Rfl: 2  Observations/Objective: Patient is well-developed, well-nourished in no acute distress.  Resting comfortably  at home.  Head is normocephalic, atraumatic.  No labored breathing.  Speech is clear and coherent with logical content.  Patient is alert and oriented at baseline.    Assessment and Plan: 1. Acute sinusitis, recurrence not specified, unspecified location - amoxicillin-clavulanate (AUGMENTIN) 875-125 MG tablet; Take 1 tablet by mouth 2 (two) times daily.  Dispense: 14 tablet; Refill: 0  - Take meds as prescribed - Use a cool mist humidifier  -Use saline nose sprays frequently -Force fluids -For any cough or congestion  Use plain Mucinex- regular strength or max strength is fine -For fever or aces or pains- take  tylenol or ibuprofen. -Throat lozenges if help -Follow up if symptoms worsen or do not improve   Follow Up Instructions: I discussed the assessment and treatment plan with the patient. The patient was provided an opportunity to ask questions and all were answered. The patient agreed with the plan and demonstrated an understanding of the instructions.  A copy of instructions were sent to the patient via MyChart unless otherwise noted below.   Patient has requested to receive PHI (AVS, Work Notes, etc) pertaining to this video visit through e-mail as they are currently without active MyChart. They have voiced understand that email is not considered secure and their health information could be viewed by someone other than the patient.   The patient was advised to call back or seek an in-person evaluation if the symptoms worsen or if the condition fails to improve as anticipated.  Time:  I spent 7 minutes with the patient via telehealth technology discussing the above problems/concerns.    Jannifer Rodney, FNP

## 2022-09-14 NOTE — Patient Instructions (Signed)

## 2022-09-21 ENCOUNTER — Encounter: Payer: Self-pay | Admitting: Family Medicine

## 2022-09-21 ENCOUNTER — Ambulatory Visit (INDEPENDENT_AMBULATORY_CARE_PROVIDER_SITE_OTHER): Payer: Medicare HMO | Admitting: Family Medicine

## 2022-09-21 VITALS — BP 105/76 | HR 88 | Temp 98.8°F | Ht 64.0 in | Wt 142.0 lb

## 2022-09-21 DIAGNOSIS — R202 Paresthesia of skin: Secondary | ICD-10-CM | POA: Diagnosis not present

## 2022-09-21 DIAGNOSIS — R350 Frequency of micturition: Secondary | ICD-10-CM | POA: Diagnosis not present

## 2022-09-21 DIAGNOSIS — R634 Abnormal weight loss: Secondary | ICD-10-CM | POA: Diagnosis not present

## 2022-09-21 DIAGNOSIS — Z720 Tobacco use: Secondary | ICD-10-CM

## 2022-09-21 DIAGNOSIS — R200 Anesthesia of skin: Secondary | ICD-10-CM | POA: Diagnosis not present

## 2022-09-21 DIAGNOSIS — R7309 Other abnormal glucose: Secondary | ICD-10-CM | POA: Diagnosis not present

## 2022-09-21 LAB — URINALYSIS, ROUTINE W REFLEX MICROSCOPIC
Bilirubin, UA: NEGATIVE
Glucose, UA: NEGATIVE
Ketones, UA: NEGATIVE
Leukocytes,UA: NEGATIVE
Nitrite, UA: NEGATIVE
Protein,UA: NEGATIVE
RBC, UA: NEGATIVE
Specific Gravity, UA: 1.015 (ref 1.005–1.030)
Urobilinogen, Ur: 0.2 mg/dL (ref 0.2–1.0)
pH, UA: 7.5 (ref 5.0–7.5)

## 2022-09-21 LAB — BAYER DCA HB A1C WAIVED: HB A1C (BAYER DCA - WAIVED): 5.9 % — ABNORMAL HIGH (ref 4.8–5.6)

## 2022-09-21 NOTE — Progress Notes (Signed)
Subjective: CC: Multiple concerns PCP: Raliegh Ip, DO [id number] I Border is a 62 y.o. female presenting to clinic today for:  1.  Left arm/hand numbness Patient reports that she has been having some left arm and left finger numbness.  She points to digits 3, 4 and 5 as the areas that are numb and tingly.  She notes this is similar to what she had on the right side, which was previously treated by orthopedics.  She has not reached out to orthopedics with regards to this but would like to see them again.  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  2.  Fatigue Patient reports that for about a month now she has been feeling fatigued, intermittently dizzy and she has been urinating a lot.  She admits that she consumes quite a bit of sweet tea which is full sugar sweet tea as well as juices.  She does not consume sodas or eat sweet things.  She is currently being treated for a sinus infection with Augmentin.   ROS: Per HPI  Allergies  Allergen Reactions   Vicodin [Hydrocodone-Acetaminophen] Nausea Only   Past Medical History:  Diagnosis Date   Allergy    Anxiety    Blood transfusion without reported diagnosis    Breast cancer (HCC)    Breast cancer (HCC) 04/21/2012   Stage II (T1c N1 M0) grade 3 triple negative breast cancer, right- sided with 1 of 5 positive nodes, status post FEC in a dose dense fashion for 6 cycles followed by radiation therapy by Dr. Roselind Messier for her high-risk disease with her initial date of surgery in February 2008.    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   COPD (chronic obstructive pulmonary disease) (HCC)    Depression    Emphysema of lung (HCC)    GERD (gastroesophageal reflux disease) 12/18/2012   Headache    Hyperlipidemia    Neuropathy    Osteoporosis    PONV (postoperative nausea and vomiting)    PTSD (post-traumatic stress disorder)    Substance abuse (HCC)     Current  Outpatient Medications:    albuterol (VENTOLIN HFA) 108 (90 Base) MCG/ACT inhaler, INHALE 2 PUFFS BY MOUTH EVERY 6 HOURS AS NEEDED FOR WHEEZING FOR SHORTNESS OF BREATH, Disp: 9 g, Rfl: 2   amoxicillin-clavulanate (AUGMENTIN) 875-125 MG tablet, Take 1 tablet by mouth 2 (two) times daily., Disp: 14 tablet, Rfl: 0   cholecalciferol (VITAMIN D) 1000 units tablet, Take 1,000 Units by mouth daily., Disp: , Rfl:    citalopram (CELEXA) 20 MG tablet, Take 1 tablet (20 mg total) by mouth daily., Disp: 30 tablet, Rfl: 2   diazepam (VALIUM) 10 MG tablet, Take 1 tablet (10 mg total) by mouth 3 (three) times daily., Disp: 90 tablet, Rfl: 2   dicyclomine (BENTYL) 10 MG capsule, TAKE 1 CAPSULE BY MOUTH THREE TIMES DAILY BEFORE MEAL(S), Disp: 120 capsule, Rfl: 0   fexofenadine (ALLEGRA) 180 MG tablet, Take 1 tablet (180 mg total) by mouth daily as needed for allergies or rhinitis., Disp: 90 tablet, Rfl: 3   fluticasone (FLOVENT HFA) 220 MCG/ACT inhaler, Inhale 2 puffs by mouth twice daily, Disp: 12 g, Rfl: 2   omeprazole (PRILOSEC) 40 MG capsule, Take 1 capsule by mouth once daily, Disp: 90 capsule, Rfl: 3   pravastatin (PRAVACHOL) 20 MG tablet, Take 1 tablet (20 mg total) by mouth daily., Disp: 90 tablet, Rfl: 3   prazosin (MINIPRESS) 2 MG  capsule, Take 1 capsule (2 mg total) by mouth at bedtime., Disp: 30 capsule, Rfl: 2   temazepam (RESTORIL) 30 MG capsule, Take 1 capsule (30 mg total) by mouth at bedtime as needed for sleep., Disp: 30 capsule, Rfl: 2 Social History   Socioeconomic History   Marital status: Divorced    Spouse name: Not on file   Number of children: 2   Years of education: 13   Highest education level: Not on file  Occupational History   Occupation: disabled    Comment: neuropathy from chemo  Tobacco Use   Smoking status: Every Day    Packs/day: 0.50    Years: 20.00    Additional pack years: 0.00    Total pack years: 10.00    Types: Cigarettes    Start date: 05/07/1992   Smokeless  tobacco: Never   Tobacco comments:    discussed  Substance and Sexual Activity   Alcohol use: Yes    Comment: 1 beer a night.   Drug use: No   Sexual activity: Yes    Birth control/protection: Post-menopausal  Other Topics Concern   Not on file  Social History Narrative   Divorced   Lives alone   Spends time with boyfriend - he is a smoker   Social Determinants of Corporate investment banker Strain: Low Risk  (04/26/2021)   Overall Financial Resource Strain (CARDIA)    Difficulty of Paying Living Expenses: Not hard at all  Food Insecurity: No Food Insecurity (04/26/2021)   Hunger Vital Sign    Worried About Running Out of Food in the Last Year: Never true    Ran Out of Food in the Last Year: Never true  Transportation Needs: No Transportation Needs (04/26/2021)   PRAPARE - Administrator, Civil Service (Medical): No    Lack of Transportation (Non-Medical): No  Physical Activity: Insufficiently Active (04/26/2021)   Exercise Vital Sign    Days of Exercise per Week: 7 days    Minutes of Exercise per Session: 20 min  Stress: No Stress Concern Present (04/26/2021)   [employer] of Occupational Health - Occupational Stress Questionnaire    Feeling of Stress : Only a little  Social Connections: Moderately Isolated (04/26/2021)   Social Connection and Isolation Panel [NHANES]    Frequency of Communication with Friends and Family: More than three times a week    Frequency of Social Gatherings with Friends and Family: Twice a week    Attends Religious Services: Never    Database administrator or Organizations: No    Attends Banker Meetings: Never    Marital Status: Living with partner  Intimate Partner Violence: Not At Risk (04/26/2021)   Humiliation, Afraid, Rape, and Kick questionnaire    Fear of Current or Ex-Partner: No    Emotionally Abused: No    Physically Abused: No    Sexually Abused: No   Family History  Problem Relation Age of Onset    COPD Mother    Depression Mother    Heart disease Mother    Hyperlipidemia Mother    Hypertension Mother    Cancer Mother        lung cancer, to bone   Cancer Father        skin   Heart disease Father    Hypertension Father    Hyperlipidemia Father    Learning disabilities Father    Drug abuse Brother        overdose at  32   Drug abuse Daughter        drug overdose 32   Cancer Maternal Uncle        lung cancer   Colon cancer Neg Hx     Objective: Office vital signs reviewed. BP 105/76   Pulse 88   Temp 98.8 F (37.1 C)   Ht 5\' 4"  (1.626 m)   Wt 142 lb (64.4 kg)   LMP 07/09/2013   SpO2 96%   BMI 24.37 kg/m   Physical Examination:  General: Awake, alert, well nourished, No acute distress HEENT: TMs intact bilaterally with normal reflex.  No exophthalmos.  No goiter Cardio: regular rate and rhythm, S1S2 heard, no murmurs appreciated Pulm: clear to auscultation bilaterally, no wheezes, rhonchi or rales; normal work of breathing on room air MSK: Able to move extremities without difficulty.  Ambulating independently.  Assessment/ Plan: 62 y.o. female   Numbness and tingling in left hand - Plan: Vitamin B12, Ambulatory referral to Orthopedic Surgery  Urinary frequency - Plan: CMP14+EGFR, Bayer DCA Hb A1c Waived, Urinalysis, Routine w reflex microscopic  Unexplained weight loss - Plan: CBC with Differential, CMP14+EGFR, Bayer DCA Hb A1c Waived, TSH, T4, Free  Tobacco use - Plan: CBC with Differential  Suspect she likely has cervical radiculopathy but given some type of nerve issue that she had in the right upper extremity I have referred her back to her specialist to address the left side.  Referral placed  Check for metabolic abnormality including diabetes.  She has had a 5 pound weight loss since February.  She is an active tobacco user so if the laboratory workup is unremarkable, would recommend further evaluation with lung imaging to rule out malignancy.   Pulmonary exam unremarkable  No orders of the defined types were placed in this encounter.  No orders of the defined types were placed in this encounter.    Raliegh Ip, DO [facility] [phone]

## 2022-09-22 LAB — CMP14+EGFR
ALT: 18 IU/L (ref 0–32)
AST: 15 IU/L (ref 0–40)
Albumin/Globulin Ratio: 2 (ref 1.2–2.2)
Albumin: 4.8 g/dL (ref 3.8–4.9)
Alkaline Phosphatase: 93 IU/L (ref 44–121)
BUN/Creatinine Ratio: 14 (ref 12–28)
BUN: 8 mg/dL (ref 8–27)
Bilirubin Total: 0.3 mg/dL (ref 0.0–1.2)
CO2: 21 mmol/L (ref 20–29)
Calcium: 9.8 mg/dL (ref 8.7–10.3)
Chloride: 99 mmol/L (ref 96–106)
Creatinine, Ser: 0.58 mg/dL (ref 0.57–1.00)
Globulin, Total: 2.4 g/dL (ref 1.5–4.5)
Glucose: 85 mg/dL (ref 70–99)
Potassium: 4.8 mmol/L (ref 3.5–5.2)
Sodium: 138 mmol/L (ref 134–144)
Total Protein: 7.2 g/dL (ref 6.0–8.5)
eGFR: 104 mL/min/{1.73_m2} (ref >59)

## 2022-09-22 LAB — CBC WITH DIFFERENTIAL/PLATELET
Basophils Absolute: 0.1 10*3/uL (ref 0.0–0.2)
Basos: 1 %
EOS (ABSOLUTE): 0.1 10*3/uL (ref 0.0–0.4)
Eos: 1 %
Hematocrit: 43.5 % (ref 34.0–46.6)
Hemoglobin: 15.3 g/dL (ref 11.1–15.9)
Immature Grans (Abs): 0 10*3/uL (ref 0.0–0.1)
Immature Granulocytes: 0 %
Lymphocytes Absolute: 3.4 10*3/uL — ABNORMAL HIGH (ref 0.7–3.1)
Lymphs: 36 %
MCH: 31.7 pg (ref 26.6–33.0)
MCHC: 35.2 g/dL (ref 31.5–35.7)
MCV: 90 fL (ref 79–97)
Monocytes Absolute: 0.5 10*3/uL (ref 0.1–0.9)
Monocytes: 6 %
Neutrophils Absolute: 5.5 10*3/uL (ref 1.4–7.0)
Neutrophils: 56 %
Platelets: 358 10*3/uL (ref 150–450)
RBC: 4.82 x10E6/uL (ref 3.77–5.28)
RDW: 12.1 % (ref 11.7–15.4)
WBC: 9.7 10*3/uL (ref 3.4–10.8)

## 2022-09-22 LAB — TSH: TSH: 2.48 u[IU]/mL (ref 0.450–4.500)

## 2022-09-22 LAB — T4, FREE: Free T4: 1.08 ng/dL (ref 0.82–1.77)

## 2022-09-22 LAB — VITAMIN B12: Vitamin B-12: 255 pg/mL (ref 232–1245)

## 2022-09-24 ENCOUNTER — Ambulatory Visit: Payer: Medicare HMO

## 2022-09-26 ENCOUNTER — Ambulatory Visit (INDEPENDENT_AMBULATORY_CARE_PROVIDER_SITE_OTHER): Payer: Medicare HMO | Admitting: *Deleted

## 2022-09-26 DIAGNOSIS — Z0000 Encounter for general adult medical examination without abnormal findings: Secondary | ICD-10-CM | POA: Diagnosis not present

## 2022-09-26 NOTE — Progress Notes (Signed)
I connected with  [name] on 09/26/22 by a audio enabled telemedicine application and verified that I am speaking with the correct person using two identifiers.  Patient Location: Home  Provider Location: Office/Clinic  I discussed the limitations of evaluation and management by telemedicine. The patient expressed understanding and agreed to proceed.  Subjective:   [name] is a 62 y.o. female who presents for Medicare Annual (Subsequent) preventive examination.  Review of Systems     Cardiac Risk Factors include: none;dyslipidemia;smoking/ tobacco exposure;sedentary lifestyle     Objective:    There were no vitals filed for this visit. There is no height or weight on file to calculate BMI.     09/26/2022    9:58 AM 04/26/2021    3:36 PM 07/21/2018    2:26 PM 11/05/2017   10:05 AM 09/06/2017    7:47 AM 08/15/2017    6:32 AM 05/30/2017    2:55 PM  Advanced Directives  Does Patient Have a Medical Advance Directive? No No  No No No No  Would patient like information on creating a medical advance directive? No - Patient declined No - Patient declined  No - Patient declined No - Patient declined No - Patient declined No - Patient declined     Information is confidential and restricted. Go to Review Flowsheets to unlock data.    Current Medications (verified) Outpatient Encounter Medications as of 09/26/2022  Medication Sig   albuterol (VENTOLIN HFA) 108 (90 Base) MCG/ACT inhaler INHALE 2 PUFFS BY MOUTH EVERY 6 HOURS AS NEEDED FOR WHEEZING FOR SHORTNESS OF BREATH   cholecalciferol (VITAMIN D) 1000 units tablet Take 1,000 Units by mouth daily.   citalopram (CELEXA) 20 MG tablet Take 1 tablet (20 mg total) by mouth daily.   diazepam (VALIUM) 10 MG tablet Take 1 tablet (10 mg total) by mouth 3 (three) times daily.   dicyclomine (BENTYL) 10 MG capsule TAKE 1 CAPSULE BY MOUTH THREE TIMES DAILY BEFORE MEAL(S)   fexofenadine (ALLEGRA) 180 MG tablet Take 1 tablet (180 mg total)  by mouth daily as needed for allergies or rhinitis.   fluticasone (FLOVENT HFA) 220 MCG/ACT inhaler Inhale 2 puffs by mouth twice daily   omeprazole (PRILOSEC) 40 MG capsule Take 1 capsule by mouth once daily   pravastatin (PRAVACHOL) 20 MG tablet Take 1 tablet (20 mg total) by mouth daily.   prazosin (MINIPRESS) 2 MG capsule Take 1 capsule (2 mg total) by mouth at bedtime.   temazepam (RESTORIL) 30 MG capsule Take 1 capsule (30 mg total) by mouth at bedtime as needed for sleep.   [DISCONTINUED] amoxicillin-clavulanate (AUGMENTIN) 875-125 MG tablet Take 1 tablet by mouth 2 (two) times daily.   No facility-administered encounter medications on file as of 09/26/2022.    Allergies (verified) Vicodin [hydrocodone-acetaminophen]   History: Past Medical History:  Diagnosis Date   Allergy    Anxiety    Blood transfusion without reported diagnosis    Breast cancer (HCC)    Breast cancer (HCC) 04/21/2012   Stage II (T1c N1 M0) grade 3 triple negative breast cancer, right- sided with 1 of 5 positive nodes, status post FEC in a dose dense fashion for 6 cycles followed by radiation therapy by Dr. Roselind Messier for her high-risk disease with her initial date of surgery in February 2008.    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   COPD (chronic obstructive pulmonary disease) (HCC)    Depression    Emphysema of lung (HCC)    GERD (  gastroesophageal reflux disease) 12/18/2012   Headache    Hyperlipidemia    Neuropathy    Osteoporosis    PONV (postoperative nausea and vomiting)    PTSD (post-traumatic stress disorder)    Substance abuse [facility])    Past Surgical History:  Procedure Laterality Date   BIOPSY  11/05/2017   Procedure: BIOPSY;  Surgeon: West Bali, MD;  Location: [facility];  Service: Endoscopy;;  colon duodenum gastric   BREAST SURGERY     CATARACT EXTRACTION W/PHACO Left 08/15/2017   Procedure: CATARACT EXTRACTION WITH PHACOEMULSIFICATION  AND INTRAOCULAR LENS PLACEMENT LEFT  EYE CDE=2.68;  Surgeon: Fabio Pierce, MD;  Location: [facility] ORS;  Service: Ophthalmology;  Laterality: Left;  left   CATARACT EXTRACTION W/PHACO Right 09/06/2017   Procedure: CATARACT EXTRACTION PHACO AND INTRAOCULAR LENS PLACEMENT (IOC);  Surgeon: Fabio Pierce, MD;  Location: [facility] ORS;  Service: Ophthalmology;  Laterality: Right;  CDE: 1.61   COLONOSCOPY WITH PROPOFOL N/A 11/05/2017    6 mm polyp in the sigmoid colon which was sessile and removed.  Rectosigmoid colon and sigmoid colon biopsies taken for evaluation for microscopic colitis.  Intermittent rectal bleeding due to sigmoid colon polyp and internal hemorrhoids. Normal colonic biopsies. Tubular adenoma. Repeat surveillance 5-10 years.    ESOPHAGOGASTRODUODENOSCOPY  03/10/2008   SLF: Normal esophagus without evidence of Barrett, mass, erosion  ulceration, or stricture, small bowel bx negative, gastritis with NO h.pylori   ESOPHAGOGASTRODUODENOSCOPY (EGD) WITH PROPOFOL N/A 11/05/2017   Mild gastritis and duodenitis.    leocolonoscopy  03/10/2008   ION:GEXBMW terminal ileum, approximately 10 cm visualized/Normal colon without evidence of polyps, mass, inflammatory changes, diverticula, or AVMs/Normal retroflexed view of the rectum. random colon bx negative.   MASTECTOMY     bilateral, status post reconstruction.   POLYPECTOMY  11/05/2017   Procedure: POLYPECTOMY;  Surgeon: West Bali, MD;  Location: [facility];  Service: Endoscopy;;  colon   TUBAL LIGATION     Family History  Problem Relation Age of Onset   COPD Mother    Depression Mother    Heart disease Mother    Hyperlipidemia Mother    Hypertension Mother    Cancer Mother        lung cancer, to bone   Cancer Father        skin   Heart disease Father    Hypertension Father    Hyperlipidemia Father    Learning disabilities Father    Drug abuse Brother        overdose at 39   Drug abuse Daughter        drug overdose 32   Cancer Maternal Uncle        lung cancer   Colon  cancer Neg Hx    Social History   Socioeconomic History   Marital status: Divorced    Spouse name: Not on file   Number of children: 2   Years of education: 13   Highest education level: Not on file  Occupational History   Occupation: disabled    Comment: neuropathy from chemo  Tobacco Use   Smoking status: Every Day    Packs/day: 1.00    Years: 20.00    Additional pack years: 0.00    Total pack years: 20.00    Types: Cigarettes    Start date: 05/07/1992   Smokeless tobacco: Never   Tobacco comments:    discussed  Vaping Use   Vaping Use: Never used  Substance and Sexual Activity  Alcohol use: Not Currently    Comment: 1 beer a night.   Drug use: No   Sexual activity: Yes    Birth control/protection: Post-menopausal  Other Topics Concern   Not on file  Social History Narrative   Divorced   Lives with significant other, both smoke   One child passed and one still living, lives in [place], reports sees regularly      Social Determinants of Health   Financial Resource Strain: Low Risk  (09/26/2022)   Overall Financial Resource Strain (CARDIA)    Difficulty of Paying Living Expenses: Not hard at all  Food Insecurity: No Food Insecurity (09/26/2022)   Hunger Vital Sign    Worried About Running Out of Food in the Last Year: Never true    Ran Out of Food in the Last Year: Never true  Transportation Needs: No Transportation Needs (09/26/2022)   PRAPARE - Administrator, Civil Service (Medical): No    Lack of Transportation (Non-Medical): No  Physical Activity: Inactive (09/26/2022)   Exercise Vital Sign    Days of Exercise per Week: 0 days    Minutes of Exercise per Session: 0 min  Stress: No Stress Concern Present (09/26/2022)   [employer] of Occupational Health - Occupational Stress Questionnaire    Feeling of Stress : Not at all  Social Connections: Moderately Isolated (09/26/2022)   Social Connection and Isolation Panel [NHANES]    Frequency of  Communication with Friends and Family: More than three times a week    Frequency of Social Gatherings with Friends and Family: Once a week    Attends Religious Services: Never    Database administrator or Organizations: No    Attends Engineer, structural: Never    Marital Status: Living with partner    Tobacco Counseling Ready to quit: No Counseling given: No Tobacco comments: discussed   Clinical Intake:  Pre-visit preparation completed: Yes  Pain : No/denies pain     Nutritional Status: BMI of 19-24  Normal Nutritional Risks: None Diabetes: No  How often do you need to have someone help you when you read instructions, pamphlets, or other written materials from your doctor or pharmacy?: 1 - Never What is the last grade level you completed in school?: SOME COLLEGE  Diabetic? No  Interpreter Needed?: No  Information entered by :: Iseah Plouff LPN   Activities of Daily Living    09/26/2022   10:00 AM  In your present state of health, do you have any difficulty performing the following activities:  Hearing? 1  Comment reports some difficulty  Vision? 0  Difficulty concentrating or making decisions? 1  Comment reports some short term memory loss, has to write down stuff  Walking or climbing stairs? 0  Dressing or bathing? 0  Doing errands, shopping? 0  Preparing Food and eating ? N  Using the Toilet? N  In the past six months, have you accidently leaked urine? N  Do you have problems with loss of bowel control? N  Managing your Medications? N  Managing your Finances? N  Housekeeping or managing your Housekeeping? N    Patient Care Team: Raliegh Ip, DO as PCP - General (Family Medicine) Antony Blackbird, MD as Consulting Physician (Radiation Oncology)  Indicate any recent Medical Services you may have received from other than [facility] providers in the past year (date may be approximate).     Assessment:   This is a routine wellness examination  for  [name].  Hearing/Vision screen No results found.  Dietary issues and exercise activities discussed: Current Exercise Habits: The patient does not participate in regular exercise at present, Exercise limited by: None identified;neurologic condition(s) (possible pinched nerve)   Goals Addressed             This Visit's Progress    Patient Stated       09/26/2022 AWV Goal: Keep All Scheduled Appointments  Over the next year, patient will attend all scheduled appointments with their PCP and any specialists that they see.       Depression Screen    09/26/2022    9:58 AM 09/21/2022    2:31 PM 01/10/2022    3:57 PM 12/06/2021   11:41 AM 09/05/2021    3:03 PM 06/28/2021   11:38 AM 05/31/2021    3:24 PM  PHQ 2/9 Scores  PHQ - 2 Score 4 6 0    6  PHQ- 9 Score 11 12 15    26      Information is confidential and restricted. Go to Review Flowsheets to unlock data.    Fall Risk    09/26/2022    9:58 AM 09/21/2022    2:24 PM 01/10/2022    3:57 PM 05/31/2021    3:23 PM 04/26/2021    3:29 PM  Fall Risk   Falls in the past year? 0 0 1 1 1   Number falls in past yr:  0 0 1 1  Injury with Fall?  0 0 0 1  Risk for fall due to :  No Fall Risks History of fall(s) History of fall(s) History of fall(s);Impaired balance/gait;Orthopedic patient;Other (Comment)  Risk for fall due to: Comment     neuropathy  Follow up  Education provided Education provided Education provided Education provided;Falls prevention discussed    FALL RISK PREVENTION PERTAINING TO THE HOME:  Any stairs in or around the home? Yes  If so, are there any without handrails? Yes  Home free of loose throw rugs in walkways, pet beds, electrical cords, etc? Yes  Adequate lighting in your home to reduce risk of falls? Yes   ASSISTIVE DEVICES UTILIZED TO PREVENT FALLS:  Life alert? No  Use of a cane, walker or w/c? No  Grab bars in the bathroom? No  Shower chair or bench in shower? No  Elevated toilet seat or a handicapped  toilet? No    Cognitive Function:        09/26/2022   10:03 AM 04/26/2021    3:38 PM  6CIT Screen  What Year? 0 points 0 points  What month? 0 points 0 points  What time? 0 points 0 points  Count back from 20 0 points 0 points  Months in reverse 4 points 4 points  Repeat phrase 4 points 2 points  Total Score 8 points 6 points    Immunizations Immunization History  Administered Date(s) Administered   Influenza Whole 03/21/2016   Influenza,inj,Quad PF,6+ Mos 02/14/2015, 03/14/2018, 05/11/2020   Pneumococcal Polysaccharide-23 03/14/2018   Tdap 01/19/2021   Zoster Recombinat (Shingrix) 01/19/2021    TDAP status: Up to date  Flu Vaccine status: Due, Education has been provided regarding the importance of this vaccine. Advised may receive this vaccine at local pharmacy or Health Dept. Aware to provide a copy of the vaccination record if obtained from local pharmacy or Health Dept. Verbalized acceptance and understanding.  Pneumococcal vaccine status: Up to date  Covid-19 vaccine status: Declined, Education has been provided regarding the importance  of this vaccine but patient still declined. Advised may receive this vaccine at local pharmacy or Health Dept.or vaccine clinic. Aware to provide a copy of the vaccination record if obtained from local pharmacy or Health Dept. Verbalized acceptance and understanding.  Qualifies for Shingles Vaccine? Yes   Zostavax completed      Shingrix Completed?: Yes, only one dose completed  Screening Tests Health Maintenance  Topic Date Due   PAP SMEAR-Modifier  09/26/2022 (Originally 11/24/2019)   COVID-19 Vaccine (1) 10/07/2022 (Originally 06/16/1965)   Zoster Vaccines- Shingrix (2 of 2) 12/27/2022 (Originally 03/16/2021)   INFLUENZA VACCINE  12/06/2022   Medicare Annual Wellness (AWV)  09/26/2023   COLONOSCOPY (Pts 45-24yrs Insurance coverage will need to be confirmed)  11/06/2027   DTaP/Tdap/Td (2 - Td or Tdap) 01/20/2031   Hepatitis C  Screening  Completed   HIV Screening  Completed   HPV VACCINES  Aged Out   MAMMOGRAM  Discontinued    Health Maintenance  There are no preventive care reminders to display for this patient.   Colorectal cancer screening: Type of screening: Colonoscopy. Completed 11/05/2017. Repeat every 5-10 years  Mammogram Screening: Due, Patient has breast implants and requires MRI instead of mammogram. Last MRI 12/09/2019. Bone Density Screening: Due  Lung Cancer Screening Referral: Due  Additional Screening:  Hepatitis C Screening:  Completed 07/08/2017  Community Resource Referral / Chronic Care Management: CRR required this visit?  No   CCM required this visit?  No      Plan:     I have personally reviewed and noted the following in the patient's chart:   Medical and social history Use of alcohol, tobacco or illicit drugs  Current medications and supplements including opioid prescriptions. Patient is not currently taking opioid prescriptions. Functional ability and status Nutritional status Physical activity Advanced directives List of other physicians Hospitalizations, surgeries, and ER visits in previous 12 months Vitals Screenings to include cognitive, depression, and falls Referrals and appointments  In addition, I have reviewed and discussed with patient certain preventive protocols, quality metrics, and best practice recommendations. A written personalized care plan for preventive services as well as general preventive health recommendations were provided to patient.     Diamantina Monks LPN   1/61/0960   Nurse Notes: AVS PRINTED AND MAILED TO PATIENT

## 2022-09-26 NOTE — Patient Instructions (Signed)
  Ms. Ghaffari , Thank you for taking time to come for your Medicare Wellness Visit. I appreciate your ongoing commitment to your health goals. Please review the following plan we discussed and let me know if I can assist you in the future.   These are the goals we discussed:  Goals      Patient Stated     09/26/2022 AWV Goal: Keep All Scheduled Appointments  Over the next year, patient will attend all scheduled appointments with their PCP and any specialists that they see.      Quit smoking / using tobacco        This is a list of the screening recommended for you and due dates:  Health Maintenance  Topic Date Due   Pap Smear  09/26/2022*   COVID-19 Vaccine (1) 10/07/2022*   Zoster (Shingles) Vaccine (2 of 2) 12/27/2022*   Flu Shot  12/06/2022   Medicare Annual Wellness Visit  09/26/2023   Colon Cancer Screening  11/06/2027   DTaP/Tdap/Td vaccine (2 - Td or Tdap) 01/20/2031   Hepatitis C Screening: USPSTF Recommendation to screen - Ages 34-79 yo.  Completed   HIV Screening  Completed   HPV Vaccine  Aged Out   Mammogram  Discontinued  *Topic was postponed. The date shown is not the original due date.

## 2022-09-27 ENCOUNTER — Encounter: Payer: Self-pay | Admitting: *Deleted

## 2022-10-04 ENCOUNTER — Encounter: Payer: Self-pay | Admitting: Orthopaedic Surgery

## 2022-10-04 ENCOUNTER — Ambulatory Visit: Payer: Medicare HMO | Admitting: Orthopaedic Surgery

## 2022-10-04 VITALS — Ht 64.0 in | Wt 142.0 lb

## 2022-10-04 DIAGNOSIS — M5030 Other cervical disc degeneration, unspecified cervical region: Secondary | ICD-10-CM | POA: Diagnosis not present

## 2022-10-04 DIAGNOSIS — M542 Cervicalgia: Secondary | ICD-10-CM

## 2022-10-04 NOTE — Progress Notes (Signed)
Office Visit Note   Patient: [name]           Date of Birth: [birth year]           MRN: [id number] Visit Date: 10/04/2022              Requested by: Raliegh Ip, DO [address] [place],  [place] [address] PCP: Raliegh Ip, DO   Assessment & Plan: Visit Diagnoses:  1. Neck pain   2. Other cervical disc degeneration, unspecified cervical region     Plan: Will set patient up for some therapy for cervical spondylosis disc degeneration.  She continues to have numbness and tingling in her left upper extremity we can consider diagnostic imaging versus electrical test.  On exam today no evidence of ulnar nerve compression at the wrist elbow and no evidence of median compression.  Follow-Up Instructions: Return in about 6 weeks (around 11/15/2022).   Orders:  Orders Placed This Encounter  Procedures   Ambulatory referral to Physical Therapy   No orders of the defined types were placed in this encounter.     Procedures: No procedures performed   Clinical Data: No additional findings.   Subjective: Chief Complaint  Patient presents with   Left Hand - Numbness    HPI 62 year old female last seen 2019 said onset 1 month numbness and tingling in her hands originally it was long through small finger and out in the index finger.  She states is tingling currently she has some stiff neck at times.  Recent x-rays showed some disc space narrowing at C5-6 straightening of cervical spine noted on 05/31/2021 images.  Is myelopathic symptoms.  She has not noticed any weakness in arm.  Review of Systems all other systems noncontributory to HPI.   Objective: Vital Signs: Ht 5\' 4"  (1.626 m)   Wt 142 lb (64.4 kg)   LMP 07/09/2013   BMI 24.37 kg/m   Physical Exam Constitutional:      Appearance: She is well-developed.  HENT:     Head: Normocephalic.     Right Ear: External ear normal.     Left Ear: External ear normal. There is no impacted cerumen.  Eyes:      Pupils: Pupils are equal, round, and reactive to light.  Neck:     Thyroid: No thyromegaly.     Trachea: No tracheal deviation.  Cardiovascular:     Rate and Rhythm: Normal rate.  Pulmonary:     Effort: Pulmonary effort is normal.  Abdominal:     Palpations: Abdomen is soft.  Musculoskeletal:     Cervical back: No rigidity.  Skin:    General: Skin is warm and dry.  Neurological:     Mental Status: She is alert and oriented to person, place, and time.  Psychiatric:        Behavior: Behavior normal.     Ortho Exam patient has some brachial plexus tenderness on the left ulnar nerve at the elbow is normal triceps is strong reflexes are 2+ and symmetrical no interossei weakness.  Wrist flexion wrist extension is normal normal pronation supination strength.  Mildly positive Spurling on the left negative on the right.  Normal heel-toe gait.  Specialty Comments:  No specialty comments available.  Imaging: Study Result  Narrative & Impression  CLINICAL DATA:  Numbness tingling   EXAM: CERVICAL SPINE - 2-3 VIEW   COMPARISON:  04/10/2018   FINDINGS: Straightening of the cervical spine. Vertebral body heights are normal.  Mild disc space narrowing C5-C6.   IMPRESSION: Minimal degenerative change at C5-C6.     Electronically Signed   By: Jasmine Pang M.D.   On: 06/01/2021 22:47      Result History    PMFS History: Patient Active Problem List   Diagnosis Date Noted   Other cervical disc degeneration, unspecified cervical region 10/04/2022   Abdominal cramping 01/19/2022   Loss of weight 01/19/2022   Diarrhea 09/03/2017   Rectal bleeding 09/03/2017   Melena 09/03/2017   Eczema 09/02/2017   Prediabetes 12/14/2016   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   COPD mixed type (HCC) 06/21/2016   HLD (hyperlipidemia) 06/21/2016   Vitamin D deficiency 06/21/2016   Post traumatic stress disorder (PTSD) 06/21/2016   Angular cheilitis 06/21/2016   Major  depression 02/05/2014   Tobacco abuse 12/18/2012   GERD (gastroesophageal reflux disease) 12/18/2012   Breast cancer (HCC) 04/21/2012   Past Medical History:  Diagnosis Date   Allergy    Anxiety    Blood transfusion without reported diagnosis    Breast cancer (HCC)    Breast cancer (HCC) 04/21/2012   Stage II (T1c N1 M0) grade 3 triple negative breast cancer, right- sided with 1 of 5 positive nodes, status post FEC in a dose dense fashion for 6 cycles followed by radiation therapy by Dr. Roselind Messier for her high-risk disease with her initial date of surgery in February 2008.    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   COPD (chronic obstructive pulmonary disease) (HCC)    Depression    Emphysema of lung (HCC)    GERD (gastroesophageal reflux disease) 12/18/2012   Headache    Hyperlipidemia    Neuropathy    Osteoporosis    PONV (postoperative nausea and vomiting)    PTSD (post-traumatic stress disorder)    Substance abuse (HCC)     Family History  Problem Relation Age of Onset   COPD Mother    Depression Mother    Heart disease Mother    Hyperlipidemia Mother    Hypertension Mother    Cancer Mother        lung cancer, to bone   Cancer Father        skin   Heart disease Father    Hypertension Father    Hyperlipidemia Father    Learning disabilities Father    Drug abuse Brother        overdose at 29   Drug abuse Daughter        drug overdose 32   Cancer Maternal Uncle        lung cancer   Colon cancer Neg Hx     Past Surgical History:  Procedure Laterality Date   BIOPSY  11/05/2017   Procedure: BIOPSY;  Surgeon: West Bali, MD;  Location: [facility];  Service: Endoscopy;;  colon duodenum gastric   BREAST SURGERY     CATARACT EXTRACTION W/PHACO Left 08/15/2017   Procedure: CATARACT EXTRACTION WITH PHACOEMULSIFICATION  AND INTRAOCULAR LENS PLACEMENT LEFT EYE CDE=2.68;  Surgeon: Fabio Pierce, MD;  Location: [facility];  Service: Ophthalmology;  Laterality:  Left;  left   CATARACT EXTRACTION W/PHACO Right 09/06/2017   Procedure: CATARACT EXTRACTION PHACO AND INTRAOCULAR LENS PLACEMENT (IOC);  Surgeon: Fabio Pierce, MD;  Location: [facility];  Service: Ophthalmology;  Laterality: Right;  CDE: 1.61   COLONOSCOPY WITH PROPOFOL N/A 11/05/2017    6 mm polyp in the sigmoid colon which was sessile and removed.  Rectosigmoid colon and sigmoid colon biopsies taken  for evaluation for microscopic colitis.  Intermittent rectal bleeding due to sigmoid colon polyp and internal hemorrhoids. Normal colonic biopsies. Tubular adenoma. Repeat surveillance 5-10 years.    ESOPHAGOGASTRODUODENOSCOPY  03/10/2008   SLF: Normal esophagus without evidence of Barrett, mass, erosion  ulceration, or stricture, small bowel bx negative, gastritis with NO h.pylori   ESOPHAGOGASTRODUODENOSCOPY (EGD) WITH PROPOFOL N/A 11/05/2017   Mild gastritis and duodenitis.    leocolonoscopy  03/10/2008   WUJ:WJXBJY terminal ileum, approximately 10 cm visualized/Normal colon without evidence of polyps, mass, inflammatory changes, diverticula, or AVMs/Normal retroflexed view of the rectum. random colon bx negative.   MASTECTOMY     bilateral, status post reconstruction.   POLYPECTOMY  11/05/2017   Procedure: POLYPECTOMY;  Surgeon: West Bali, MD;  Location: [facility];  Service: Endoscopy;;  colon   TUBAL LIGATION     Social History   Occupational History   Occupation: disabled    Comment: neuropathy from chemo  Tobacco Use   Smoking status: Every Day    Packs/day: 1.00    Years: 20.00    Additional pack years: 0.00    Total pack years: 20.00    Types: Cigarettes    Start date: 05/07/1992   Smokeless tobacco: Never   Tobacco comments:    discussed  Vaping Use   Vaping Use: Never used  Substance and Sexual Activity   Alcohol use: Not Currently    Comment: 1 beer a night.   Drug use: No   Sexual activity: Yes    Birth control/protection: Post-menopausal

## 2022-10-05 ENCOUNTER — Ambulatory Visit (INDEPENDENT_AMBULATORY_CARE_PROVIDER_SITE_OTHER): Payer: Medicare HMO | Admitting: Family Medicine

## 2022-10-05 ENCOUNTER — Encounter: Payer: Self-pay | Admitting: Family Medicine

## 2022-10-05 VITALS — BP 116/78 | Temp 98.8°F | Ht 64.0 in | Wt 141.0 lb

## 2022-10-05 DIAGNOSIS — R0789 Other chest pain: Secondary | ICD-10-CM | POA: Diagnosis not present

## 2022-10-05 DIAGNOSIS — M5412 Radiculopathy, cervical region: Secondary | ICD-10-CM

## 2022-10-05 NOTE — Progress Notes (Signed)
Subjective: CC: Cervical radiculopathy PCP: Judy Ip, DO QIO:[id number] [name] is a 62 y.o. female presenting to clinic today for:  1.  Cervical radiculopathy Patient was recently seen by Dr Ophelia Charter.  He recommended 6 weeks of physical therapy with return for reevaluation.  She voices some frustration over the fact that she cannot go straight to MRI as she really wants to know what is going on.  2.  Chest pressure Patient reports last week she had an episode of severe chest pressure that she describes as a squeezing pressure.  She denies any associated nausea, vomiting, shortness of breath or diaphoresis but she notes that it was bad enough that she had considered going to the emergency department.  The only reason she did not go is because she had family in the car and she was worried about getting them worried.  She has had no recurrence.  The chest pressure did lead to a panic attack ultimately but does not feel like her typical anxiety  She was advised by her physician several years ago to have a stress test done but her insurance would not cover so they did not pursue this.  She is an active smoker.  Compliant with cholesterol medication   ROS: Per HPI  Allergies  Allergen Reactions   Vicodin [Hydrocodone-Acetaminophen] Nausea Only   Past Medical History:  Diagnosis Date   Allergy    Anxiety    Blood transfusion without reported diagnosis    Breast cancer (HCC)    Breast cancer (HCC) 04/21/2012   Stage II (T1c N1 M0) grade 3 triple negative breast cancer, right- sided with 1 of 5 positive nodes, status post FEC in a dose dense fashion for 6 cycles followed by radiation therapy by Dr. Roselind Messier for her high-risk disease with her initial date of surgery in February 2008.    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   COPD (chronic obstructive pulmonary disease) (HCC)    Depression    Emphysema of lung (HCC)    GERD (gastroesophageal reflux disease) 12/18/2012    Headache    Hyperlipidemia    Neuropathy    Osteoporosis    PONV (postoperative nausea and vomiting)    PTSD (post-traumatic stress disorder)    Substance abuse (HCC)     Current Outpatient Medications:    albuterol (VENTOLIN HFA) 108 (90 Base) MCG/ACT inhaler, INHALE 2 PUFFS BY MOUTH EVERY 6 HOURS AS NEEDED FOR WHEEZING FOR SHORTNESS OF BREATH, Disp: 9 g, Rfl: 2   cholecalciferol (VITAMIN D) 1000 units tablet, Take 1,000 Units by mouth daily., Disp: , Rfl:    citalopram (CELEXA) 20 MG tablet, Take 1 tablet (20 mg total) by mouth daily., Disp: 30 tablet, Rfl: 2   diazepam (VALIUM) 10 MG tablet, Take 1 tablet (10 mg total) by mouth 3 (three) times daily., Disp: 90 tablet, Rfl: 2   dicyclomine (BENTYL) 10 MG capsule, TAKE 1 CAPSULE BY MOUTH THREE TIMES DAILY BEFORE MEAL(S), Disp: 120 capsule, Rfl: 0   fexofenadine (ALLEGRA) 180 MG tablet, Take 1 tablet (180 mg total) by mouth daily as needed for allergies or rhinitis., Disp: 90 tablet, Rfl: 3   fluticasone (FLOVENT HFA) 220 MCG/ACT inhaler, Inhale 2 puffs by mouth twice daily, Disp: 12 g, Rfl: 2   omeprazole (PRILOSEC) 40 MG capsule, Take 1 capsule by mouth once daily, Disp: 90 capsule, Rfl: 3   pravastatin (PRAVACHOL) 20 MG tablet, Take 1 tablet (20 mg total) by mouth daily., Disp: 90 tablet, Rfl: 3  prazosin (MINIPRESS) 2 MG capsule, Take 1 capsule (2 mg total) by mouth at bedtime., Disp: 30 capsule, Rfl: 2   temazepam (RESTORIL) 30 MG capsule, Take 1 capsule (30 mg total) by mouth at bedtime as needed for sleep., Disp: 30 capsule, Rfl: 2 Social History   Socioeconomic History   Marital status: Divorced    Spouse name: Not on file   Number of children: 2   Years of education: 13   Highest education level: Not on file  Occupational History   Occupation: disabled    Comment: neuropathy from chemo  Tobacco Use   Smoking status: Every Day    Packs/day: 1.00    Years: 20.00    Additional pack years: 0.00    Total pack years: 20.00     Types: Cigarettes    Start date: 05/07/1992   Smokeless tobacco: Never   Tobacco comments:    discussed  Vaping Use   Vaping Use: Never used  Substance and Sexual Activity   Alcohol use: Not Currently    Comment: 1 beer a night.   Drug use: No   Sexual activity: Yes    Birth control/protection: Post-menopausal  Other Topics Concern   Not on file  Social History Narrative   Divorced   Lives with significant other, both smoke   One child passed and one still living, lives in [place], reports sees regularly      Social Determinants of Health   Financial Resource Strain: Low Risk  (09/26/2022)   Overall Financial Resource Strain (CARDIA)    Difficulty of Paying Living Expenses: Not hard at all  Food Insecurity: No Food Insecurity (09/26/2022)   Hunger Vital Sign    Worried About Running Out of Food in the Last Year: Never true    Ran Out of Food in the Last Year: Never true  Transportation Needs: No Transportation Needs (09/26/2022)   PRAPARE - Administrator, Civil Service (Medical): No    Lack of Transportation (Non-Medical): No  Physical Activity: Inactive (09/26/2022)   Exercise Vital Sign    Days of Exercise per Week: 0 days    Minutes of Exercise per Session: 0 min  Stress: No Stress Concern Present (09/26/2022)   [employer] of Occupational Health - Occupational Stress Questionnaire    Feeling of Stress : Not at all  Social Connections: Moderately Isolated (09/26/2022)   Social Connection and Isolation Panel [NHANES]    Frequency of Communication with Friends and Family: More than three times a week    Frequency of Social Gatherings with Friends and Family: Once a week    Attends Religious Services: Never    Database administrator or Organizations: No    Attends Banker Meetings: Never    Marital Status: Living with partner  Intimate Partner Violence: Not At Risk (09/26/2022)   Humiliation, Afraid, Rape, and Kick questionnaire    Fear of  Current or Ex-Partner: No    Emotionally Abused: No    Physically Abused: No    Sexually Abused: No   Family History  Problem Relation Age of Onset   COPD Mother    Depression Mother    Heart disease Mother    Hyperlipidemia Mother    Hypertension Mother    Cancer Mother        lung cancer, to bone   Cancer Father        skin   Heart disease Father    Hypertension Father  Hyperlipidemia Father    Learning disabilities Father    Drug abuse Brother        overdose at 59   Drug abuse Daughter        drug overdose 32   Cancer Maternal Uncle        lung cancer   Colon cancer Neg Hx     Objective: Office vital signs reviewed. BP 116/78   Temp 98.8 F (37.1 C)   Ht 5\' 4"  (1.626 m)   Wt 141 lb (64 kg)   LMP 07/09/2013   BMI 24.20 kg/m   Physical Examination:  General: Awake, alert, well nourished, No acute distress HEENT: sclera white,MMM Cardio: regular rate and rhythm, S1S2 heard, no murmurs appreciated Pulm: clear to auscultation bilaterally, no wheezes, rhonchi or rales; normal work of breathing on room air     09/26/2022    9:58 AM 09/21/2022    2:31 PM 01/10/2022    3:57 PM  Depression screen PHQ 2/9  Decreased Interest 3 3 0  Down, Depressed, Hopeless 1 3 0  PHQ - 2 Score 4 6 0  Altered sleeping 0 0 3  Tired, decreased energy 2 0 3  Change in appetite 2 3 3   Feeling bad or failure about yourself  2 3 3   Trouble concentrating 1 0 2  Moving slowly or fidgety/restless 0 0 0  Suicidal thoughts 0 0 1  PHQ-9 Score 11 12 15   Difficult doing work/chores Somewhat difficult Somewhat difficult Somewhat difficult      09/21/2022    2:24 PM 01/10/2022    3:58 PM 05/31/2021    3:24 PM 01/17/2021    8:26 AM  GAD 7 : Generalized Anxiety Score  Nervous, Anxious, on Edge 0 3 3 3   Control/stop worrying 3 3 3 3   Worry too much - different things 3 3 3 3   Trouble relaxing 2 2 2 1   Restless 2 2 2 1   Easily annoyed or irritable 3 3 3 3   Afraid - awful might happen 3 3 3  3   Total GAD 7 Score 16 19 19 17   Anxiety Difficulty Somewhat difficult Somewhat difficult Somewhat difficult Very difficult   Assessment/ Plan: 62 y.o. female   Cervical radiculopathy  Atypical chest pain - Plan: EKG 12-Lead, Ambulatory referral to Cardiology  Advised to proceed with 6 weeks of physical therapy as this is the protocol in order to get her MRI covered.  Judy agree with Dr. Ophelia Charter' recommendations.  EKG obtained for chest pressure.  This demonstrated no obvious ischemic processes.  Given hypercholesterolemia and tobacco use Judy did go ahead and refer for further evaluation with cardiology.  Sounds like she was recommended a stress test several years ago but insurance did not cover so nothing else was pursued.  No orders of the defined types were placed in this encounter.  No orders of the defined types were placed in this encounter.    Judy Ip, DO [facility] [phone]

## 2022-10-22 ENCOUNTER — Telehealth: Payer: Self-pay | Admitting: Family Medicine

## 2022-10-22 ENCOUNTER — Other Ambulatory Visit (HOSPITAL_COMMUNITY): Payer: Self-pay | Admitting: Psychiatry

## 2022-10-22 ENCOUNTER — Encounter: Payer: Self-pay | Admitting: Family Medicine

## 2022-10-22 ENCOUNTER — Other Ambulatory Visit: Payer: Self-pay | Admitting: *Deleted

## 2022-10-22 DIAGNOSIS — K219 Gastro-esophageal reflux disease without esophagitis: Secondary | ICD-10-CM

## 2022-10-22 MED ORDER — TEMAZEPAM 30 MG PO CAPS: 30.0000 mg | ORAL_CAPSULE | Freq: Every evening | ORAL | 2 refills | Status: DC | PRN

## 2022-10-22 MED ORDER — OMEPRAZOLE 40 MG PO CPDR
40.0000 mg | DELAYED_RELEASE_CAPSULE | Freq: Every day | ORAL | 0 refills | Status: DC
Start: 2022-10-22 — End: 2023-06-04

## 2022-10-22 NOTE — Telephone Encounter (Signed)
[place] NTBS RF not sent to pharmacy

## 2022-10-22 NOTE — Telephone Encounter (Signed)
LMTCB to schedule appt Letter mailed 

## 2022-10-22 NOTE — Telephone Encounter (Signed)
Pt aware refill sent to pharmacy 

## 2022-10-22 NOTE — Telephone Encounter (Signed)
  Prescription Request  10/22/2022  Is this a "Controlled Substance" medicine? no  Have you seen your PCP in the last 2 weeks? No  If YES, route message to pool  -  If NO, patient needs to be scheduled for appointment.  What is the name of the medication or equipment?  Omeprazole 40 mg Oral Daily Appt 12-21-2022  Have you contacted your pharmacy to request a refill? yes   Which pharmacy would you like this sent to? [facility]   Patient notified that their request is being sent to the clinical staff for review and that they should receive a response within 2 business days.

## 2022-10-23 ENCOUNTER — Other Ambulatory Visit (HOSPITAL_COMMUNITY): Payer: Self-pay | Admitting: Psychiatry

## 2022-10-23 MED ORDER — TEMAZEPAM 30 MG PO CAPS: 30.0000 mg | ORAL_CAPSULE | Freq: Every evening | ORAL | 2 refills | Status: DC | PRN

## 2022-10-24 ENCOUNTER — Telehealth: Payer: Self-pay | Admitting: *Deleted

## 2022-10-24 NOTE — Telephone Encounter (Signed)
  Procedure: colonoscopy  Height: 5'4" Weight: 141 lb      BMI: 24.2  Have you had a colonoscopy before?  Yes, 11/05/17,Dr.Feilds  Do you have family history of colon cancer?  no  Do you have a family history of polyps? yes  Previous colonoscopy with polyps removed? yes  Do you have a history colorectal cancer?   no  Are you diabetic?  no  Do you have a prosthetic or mechanical heart valve? no  Do you have a pacemaker/defibrillator?   no  Have you had endocarditis/atrial fibrillation?  no  Do you use supplemental oxygen/CPAP?  no  Have you had joint replacement within the last 12 months?  no  Do you tend to be constipated or have to use laxatives?  no   Do you have history of alcohol use? If yes, how much and how often.  no  Do you have history or are you using drugs? If yes, what do are you  using?  no  Have you ever had a stroke/heart attack?  no  Have you ever had a heart or other vascular stent placed,?no  Do you take weight loss medication? no  female patients,: have you had a hysterectomy? no                              are you post menopausal?  np                              do you still have your menstrual cycle? no    Date of last menstrual period? unknown  Do you take any blood-thinning medications such as: (Plavix, aspirin, Coumadin, Aggrenox, Brilinta, Xarelto, Eliquis, Pradaxa, Savaysa or Effient)? no  If yes we need the name, milligram, dosage and who is prescribing doctor:  n/a             Current Outpatient Medications  Medication Sig Dispense Refill   albuterol (VENTOLIN HFA) 108 (90 Base) MCG/ACT inhaler INHALE 2 PUFFS BY MOUTH EVERY 6 HOURS AS NEEDED FOR WHEEZING FOR SHORTNESS OF BREATH 9 g 2   cholecalciferol (VITAMIN D) 1000 units tablet Take 1,000 Units by mouth daily.     citalopram (CELEXA) 20 MG tablet Take 1 tablet (20 mg total) by mouth daily. 30 tablet 2   diazepam (VALIUM) 10 MG tablet Take 1 tablet (10 mg total) by mouth 3 (three)  times daily. 90 tablet 2   dicyclomine (BENTYL) 10 MG capsule TAKE 1 CAPSULE BY MOUTH THREE TIMES DAILY BEFORE MEAL(S) 120 capsule 0   fexofenadine (ALLEGRA) 180 MG tablet Take 1 tablet (180 mg total) by mouth daily as needed for allergies or rhinitis. 90 tablet 3   omeprazole (PRILOSEC) 40 MG capsule Take 1 capsule (40 mg total) by mouth daily. 90 capsule 0   pravastatin (PRAVACHOL) 20 MG tablet Take 1 tablet (20 mg total) by mouth daily. 90 tablet 3   prazosin (MINIPRESS) 2 MG capsule Take 1 capsule (2 mg total) by mouth at bedtime. 30 capsule 2   temazepam (RESTORIL) 30 MG capsule Take 1 capsule (30 mg total) by mouth at bedtime as needed for sleep. 30 capsule 2   No current facility-administered medications for this visit.    Allergies  Allergen Reactions   Vicodin [Hydrocodone-Acetaminophen] Nausea Only

## 2022-11-15 ENCOUNTER — Ambulatory Visit: Payer: Medicare HMO | Admitting: Orthopaedic Surgery

## 2022-12-03 ENCOUNTER — Telehealth (HOSPITAL_COMMUNITY): Payer: Self-pay | Admitting: *Deleted

## 2022-12-03 ENCOUNTER — Other Ambulatory Visit (HOSPITAL_COMMUNITY): Payer: Self-pay | Admitting: Psychiatry

## 2022-12-03 MED ORDER — TEMAZEPAM 30 MG PO CAPS: 30.0000 mg | ORAL_CAPSULE | Freq: Every evening | ORAL | 2 refills | Status: DC | PRN

## 2022-12-03 NOTE — Telephone Encounter (Signed)
Can you please schedule pt an OV? Thank you

## 2022-12-03 NOTE — Telephone Encounter (Signed)
Patient called stating she do not want to refill her Temazepam to be sent to [facility]) anymore. Per pt she would like it to be sent to [facility] in [place].   Per pt she would like for provider to please send new script for her Temazepam to [facility] in [place].

## 2022-12-04 NOTE — Telephone Encounter (Signed)
Patient aware.

## 2022-12-04 NOTE — Telephone Encounter (Signed)
noted 

## 2022-12-11 ENCOUNTER — Telehealth (INDEPENDENT_AMBULATORY_CARE_PROVIDER_SITE_OTHER): Payer: Medicare HMO | Admitting: Psychiatry

## 2022-12-11 ENCOUNTER — Encounter (HOSPITAL_COMMUNITY): Payer: Self-pay | Admitting: Psychiatry

## 2022-12-11 DIAGNOSIS — F419 Anxiety disorder, unspecified: Secondary | ICD-10-CM | POA: Diagnosis not present

## 2022-12-11 DIAGNOSIS — F4310 Post-traumatic stress disorder, unspecified: Secondary | ICD-10-CM | POA: Diagnosis not present

## 2022-12-11 DIAGNOSIS — F332 Major depressive disorder, recurrent severe without psychotic features: Secondary | ICD-10-CM

## 2022-12-11 DIAGNOSIS — F32A Depression, unspecified: Secondary | ICD-10-CM | POA: Diagnosis not present

## 2022-12-11 DIAGNOSIS — F411 Generalized anxiety disorder: Secondary | ICD-10-CM

## 2022-12-11 MED ORDER — DIAZEPAM 10 MG PO TABS: 10.0000 mg | ORAL_TABLET | Freq: Three times a day (TID) | ORAL | 2 refills | Status: DC

## 2022-12-11 MED ORDER — TEMAZEPAM 30 MG PO CAPS: 30.0000 mg | ORAL_CAPSULE | Freq: Every evening | ORAL | 2 refills | Status: DC | PRN

## 2022-12-11 MED ORDER — PRAZOSIN HCL 2 MG PO CAPS: 2.0000 mg | ORAL_CAPSULE | Freq: Every day | ORAL | 2 refills | Status: DC

## 2022-12-11 MED ORDER — CITALOPRAM HYDROBROMIDE 20 MG PO TABS: 20.0000 mg | ORAL_TABLET | Freq: Every day | ORAL | 2 refills | Status: DC

## 2022-12-11 NOTE — Progress Notes (Signed)
Virtual Visit via Telephone Note  I connected with Barbara Cower on 12/11/22 at  1:00 PM EDT by telephone and verified that I am speaking with the correct person using two identifiers.  Location: Patient: home Provider: office   I discussed the limitations, risks, security and privacy concerns of performing an evaluation and management service by telephone and the availability of in person appointments. I also discussed with the patient that there may be a patient responsible charge related to this service. The patient expressed understanding and agreed to proceed.       I discussed the assessment and treatment plan with the patient. The patient was provided an opportunity to ask questions and all were answered. The patient agreed with the plan and demonstrated an understanding of the instructions.   The patient was advised to call back or seek an in-person evaluation if the symptoms worsen or if the condition fails to improve as anticipated.  I provided 15 minutes of non-face-to-face time during this encounter.   Diannia Ruder, MD  [facility] MD/PA/NP OP Progress Note  12/11/2022 1:20 PM DAYANNE ELFERING  MRN:  [id number]  Chief Complaint:  Chief Complaint  Patient presents with   Depression   Anxiety   Follow-up   HPI: This patient is a 62 year old divorced white female who lives with her boyfriend in [place].  She has 1 grown son and 1 grandson.  She has a daughter who died in 03-Jan-2012 of a narcotic overdose.  She is on disability.   The patient returns for follow-up after 3 months regarding her depression and anxiety.  She continues to do fairly well.  She gets to see her grandson every other weekend and she really enjoys her time with him.  She states her mood is up-and-down but primarily.  She denies any thoughts of self-harm or suicide.  She states that she is sleeping well and denies any recent nightmares.  She denies any thoughts of self-harm or suicide. Visit Diagnosis:     ICD-10-CM   1. Major depressive disorder, recurrent, severe without psychotic features (HCC)  F33.2     2. Generalized anxiety disorder  F41.1     3. PTSD (post-traumatic stress disorder)  F43.10       Past Psychiatric History: Past outpatient treatment  Past Medical History:  Past Medical History:  Diagnosis Date   Allergy    Anxiety    Blood transfusion without reported diagnosis    Breast cancer (HCC)    Breast cancer (HCC) 04/21/2012   Stage II (T1c N1 M0) grade 3 triple negative breast cancer, right- sided with 1 of 5 positive nodes, status post FEC in a dose dense fashion for 6 cycles followed by radiation therapy by Dr. Roselind Messier for her high-risk disease with her initial date of surgery in February 2008.    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   COPD (chronic obstructive pulmonary disease) (HCC)    Depression    Emphysema of lung (HCC)    GERD (gastroesophageal reflux disease) 12/18/2012   Headache    Hyperlipidemia    Neuropathy    Osteoporosis    PONV (postoperative nausea and vomiting)    PTSD (post-traumatic stress disorder)    Substance abuse [facility])     Past Surgical History:  Procedure Laterality Date   BIOPSY  11/05/2017   Procedure: BIOPSY;  Surgeon: West Bali, MD;  Location: [facility];  Service: Endoscopy;;  colon duodenum gastric   BREAST SURGERY  CATARACT EXTRACTION W/PHACO Left 08/15/2017   Procedure: CATARACT EXTRACTION WITH PHACOEMULSIFICATION  AND INTRAOCULAR LENS PLACEMENT LEFT EYE CDE=2.68;  Surgeon: Fabio Pierce, MD;  Location: [facility] ORS;  Service: Ophthalmology;  Laterality: Left;  left   CATARACT EXTRACTION W/PHACO Right 09/06/2017   Procedure: CATARACT EXTRACTION PHACO AND INTRAOCULAR LENS PLACEMENT (IOC);  Surgeon: Fabio Pierce, MD;  Location: [facility] ORS;  Service: Ophthalmology;  Laterality: Right;  CDE: 1.61   COLONOSCOPY WITH PROPOFOL N/A 11/05/2017    6 mm polyp in the sigmoid colon which was sessile and removed.  Rectosigmoid  colon and sigmoid colon biopsies taken for evaluation for microscopic colitis.  Intermittent rectal bleeding due to sigmoid colon polyp and internal hemorrhoids. Normal colonic biopsies. Tubular adenoma. Repeat surveillance 5-10 years.    ESOPHAGOGASTRODUODENOSCOPY  03/10/2008   SLF: Normal esophagus without evidence of Barrett, mass, erosion  ulceration, or stricture, small bowel bx negative, gastritis with NO h.pylori   ESOPHAGOGASTRODUODENOSCOPY (EGD) WITH PROPOFOL N/A 11/05/2017   Mild gastritis and duodenitis.    leocolonoscopy  03/10/2008   ZOX:WRUEAV terminal ileum, approximately 10 cm visualized/Normal colon without evidence of polyps, mass, inflammatory changes, diverticula, or AVMs/Normal retroflexed view of the rectum. random colon bx negative.   MASTECTOMY     bilateral, status post reconstruction.   POLYPECTOMY  11/05/2017   Procedure: POLYPECTOMY;  Surgeon: West Bali, MD;  Location: [facility];  Service: Endoscopy;;  colon   TUBAL LIGATION      Family Psychiatric History: See below  Family History:  Family History  Problem Relation Age of Onset   COPD Mother    Depression Mother    Heart disease Mother    Hyperlipidemia Mother    Hypertension Mother    Cancer Mother        lung cancer, to bone   Cancer Father        skin   Heart disease Father    Hypertension Father    Hyperlipidemia Father    Learning disabilities Father    Drug abuse Brother        overdose at 68   Drug abuse Daughter        drug overdose 32   Cancer Maternal Uncle        lung cancer   Colon cancer Neg Hx     Social History:  Social History   Socioeconomic History   Marital status: Divorced    Spouse name: Not on file   Number of children: 2   Years of education: 13   Highest education level: Not on file  Occupational History   Occupation: disabled    Comment: neuropathy from chemo  Tobacco Use   Smoking status: Every Day    Current packs/day: 1.00    Average packs/day: 1  pack/day for 30.6 years (30.6 ttl pk-yrs)    Types: Cigarettes    Start date: 05/07/1992   Smokeless tobacco: Never   Tobacco comments:    discussed  Vaping Use   Vaping status: Never Used  Substance and Sexual Activity   Alcohol use: Not Currently    Comment: 1 beer a night.   Drug use: No   Sexual activity: Yes    Birth control/protection: Post-menopausal  Other Topics Concern   Not on file  Social History Narrative   Divorced   Lives with significant other, both smoke   One child passed and one still living, lives in [place], reports sees regularly      Social Determinants of Health  Financial Resource Strain: Low Risk  (09/26/2022)   Overall Financial Resource Strain (CARDIA)    Difficulty of Paying Living Expenses: Not hard at all  Food Insecurity: No Food Insecurity (09/26/2022)   Hunger Vital Sign    Worried About Running Out of Food in the Last Year: Never true    Ran Out of Food in the Last Year: Never true  Transportation Needs: No Transportation Needs (09/26/2022)   PRAPARE - Administrator, Civil Service (Medical): No    Lack of Transportation (Non-Medical): No  Physical Activity: Inactive (09/26/2022)   Exercise Vital Sign    Days of Exercise per Week: 0 days    Minutes of Exercise per Session: 0 min  Stress: No Stress Concern Present (09/26/2022)   [employer] of Occupational Health - Occupational Stress Questionnaire    Feeling of Stress : Not at all  Social Connections: Moderately Isolated (09/26/2022)   Social Connection and Isolation Panel [NHANES]    Frequency of Communication with Friends and Family: More than three times a week    Frequency of Social Gatherings with Friends and Family: Once a week    Attends Religious Services: Never    Database administrator or Organizations: No    Attends Banker Meetings: Never    Marital Status: Living with partner    Allergies:  Allergies  Allergen Reactions   Vicodin  [Hydrocodone-Acetaminophen] Nausea Only    Metabolic Disorder Labs: Lab Results  Component Value Date   HGBA1C 5.9 (H) 09/21/2022   No results found for: "PROLACTIN" Lab Results  Component Value Date   CHOL 172 12/18/2019   TRIG 110 12/18/2019   HDL 53 12/18/2019   CHOLHDL 3.2 12/18/2019   VLDL 42 (H) 11/23/2016   LDLCALC 99 12/18/2019   LDLCALC 103 (H) 07/08/2017   Lab Results  Component Value Date   TSH 2.480 09/21/2022   TSH 3.110 01/17/2021    Therapeutic Level Labs: No results found for: "LITHIUM" No results found for: "VALPROATE" No results found for: "CBMZ"  Current Medications: Current Outpatient Medications  Medication Sig Dispense Refill   albuterol (VENTOLIN HFA) 108 (90 Base) MCG/ACT inhaler INHALE 2 PUFFS BY MOUTH EVERY 6 HOURS AS NEEDED FOR WHEEZING FOR SHORTNESS OF BREATH 9 g 2   cholecalciferol (VITAMIN D) 1000 units tablet Take 1,000 Units by mouth daily.     citalopram (CELEXA) 20 MG tablet Take 1 tablet (20 mg total) by mouth daily. 30 tablet 2   diazepam (VALIUM) 10 MG tablet Take 1 tablet (10 mg total) by mouth 3 (three) times daily. 90 tablet 2   dicyclomine (BENTYL) 10 MG capsule TAKE 1 CAPSULE BY MOUTH THREE TIMES DAILY BEFORE MEAL(S) 120 capsule 0   fexofenadine (ALLEGRA) 180 MG tablet Take 1 tablet (180 mg total) by mouth daily as needed for allergies or rhinitis. 90 tablet 3   omeprazole (PRILOSEC) 40 MG capsule Take 1 capsule (40 mg total) by mouth daily. 90 capsule 0   pravastatin (PRAVACHOL) 20 MG tablet Take 1 tablet (20 mg total) by mouth daily. 90 tablet 3   prazosin (MINIPRESS) 2 MG capsule Take 1 capsule (2 mg total) by mouth at bedtime. 30 capsule 2   temazepam (RESTORIL) 30 MG capsule Take 1 capsule (30 mg total) by mouth at bedtime as needed for sleep. 30 capsule 2   No current facility-administered medications for this visit.     Musculoskeletal: Strength & Muscle Tone: na Gait & Station: na  Patient leans: N/A  Psychiatric  Specialty Exam: Review of Systems  Musculoskeletal:  Positive for arthralgias.  All other systems reviewed and are negative.   Last menstrual period 07/09/2013.There is no height or weight on file to calculate BMI.  General Appearance: NA  Eye Contact:  NA  Speech:  Clear and Coherent  Volume:  Normal  Mood:  Euthymic  Affect:  NA  Thought Process:  Goal Directed  Orientation:  Full (Time, Place, and Person)  Thought Content: WDL   Suicidal Thoughts:  No  Homicidal Thoughts:  No  Memory:  Immediate;   Good Recent;   Good Remote;   NA  Judgement:  Good  Insight:  Fair  Psychomotor Activity:  Normal  Concentration:  Concentration: Good and Attention Span: Good  Recall:  Good  Fund of Knowledge: Good  Language: Good  Akathisia:  No  Handed:  Right  AIMS (if indicated): not done  Assets:  Communication Skills Desire for Improvement Resilience Social Support Talents/Skills  ADL's:  Intact  Cognition: WNL  Sleep:  Good   Screenings: AUDIT    Flowsheet Row Clinical Support from 04/26/2021 in [facility]  Alcohol Use Disorder Identification Test Final Score (AUDIT) 4      GAD-7    Flowsheet Row Office Visit from 09/21/2022 in [facility] Office Visit from 01/10/2022 in [facility] Office Visit from 05/31/2021 in [facility] Office Visit from 01/17/2021 in [facility] Counselor from 09/27/2020 in [facility] [place]  Total GAD-7 Score 16 19 19 17 19       PHQ2-9    Flowsheet Row Clinical Support from 09/26/2022 in [facility] Office Visit from 09/21/2022 in [facility] Office Visit from 01/10/2022 in [facility] Video Visit from 12/06/2021 in [facility] [place] Video Visit from 09/05/2021 in [facility] Total Score 4 6 0 0 0  PHQ-9 Total Score 11 12 15  -- --      Flowsheet Row Video Visit from 12/06/2021 in [facility] [place] Video Visit from 09/05/2021 in [facility] [place] Video Visit from 06/28/2021 in [facility] at [place]  C-SSRS RISK CATEGORY No Risk No Risk No Risk        Assessment and Plan: This patient is a 62 year old female with history of depression anxiety and PTSD.  She continues to do well on her current regimen.  She will continue Celexa 20 mg daily for depression, Valium 10 mg 3 times daily for anxiety, Restoril 30 mg at bedtime for sleep and prazosin 2 mg at bedtime to prevent nightmares.  She will return to see me in 3 months  Collaboration of Care: Collaboration of Care: Primary Care Provider AEB notes are shared with PCP through the epic system  Patient/Guardian was advised Release of Information must be obtained prior to any record release in order to collaborate their care with an outside provider. Patient/Guardian was advised if they have not already done so to contact the registration department to sign all necessary forms in order for [place] to release information regarding their care.   Consent: Patient/Guardian gives verbal consent for treatment and assignment of benefits for services provided during this visit. Patient/Guardian  expressed understanding and agreed to proceed.    Diannia Ruder, MD 12/11/2022, 1:20 PM

## 2022-12-14 NOTE — Progress Notes (Signed)
Referring Provider: Raliegh Ip, DO Primary Care Physician:  Raliegh Ip, DO Primary GI Physician: Dr. Marletta Lor  Chief Complaint  Patient presents with   Colonoscopy    Colonoscopy screening     HPI:   [name] is a 62 y.o. 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   Patient was last seen via virtual visit 01/19/2022.  Reported she had been doing well, off dicyclomine, until about 3 months ago.  Since that time, she had persistent, frequent watery diarrhea with associated lower abdominal cramping, nocturnal stools, and weight loss as she was trying not to eat in efforts to prevent bowel movements.  Recommended updating labs, checking stool studies, resume dicyclomine 3 times daily before meals.  Does not appear that labs or stool studies were ever completed and we have not heard from patient since.   Today: Reports she is doing well overall.  Diarrhea is controlled with dicyclomine 3 times a day.  She having 1-2 mushy to watery bowel movements daily.  No nocturnal stools.  No BRBPR or melena.  She does have some abdominal pain related to bowel movements only.   GERD is well-controlled on omeprazole 40 mg daily.  No nausea, vomiting, dysphagia.  Patient's weight has been fairly stable over the last few months, but she did lose a significant amount of weight (21 pounds) between January 2023 and September 2023.  She lost an additional 10 pounds between September 2023 and May 2024, but is gained 2 pounds back since that time.  She denies any change in oral intake or activity.  She is very active, taking care of her 51-year-old grandson.  States she snacks all throughout the day rather than eating large meals.   Wt Readings from Last 8  Encounters:  12/17/22 144 lb 3.2 oz (65.4 kg)  10/05/22 141 lb (64 kg)  10/04/22 142 lb (64.4 kg)  09/21/22 142 lb (64.4 kg)  06/12/22 147 lb (66.7 kg)  01/19/22 152 lb (68.9 kg)  01/10/22 149 lb (67.6 kg)  05/31/21 173 lb (78.5 kg)   Reports Crohn's disease runs in her family.  Cousin and maternal aunt with Crohn's disease.  No family history of colon cancer.   Prior evaluation for diarrhea included colon biopsies negative for microscopic colitis, duodenal biopsies benign, stool studies negative in 2019, TSH previously within normal limits.    Patient states that she had an episode of severe chest pressure after going to pick up her 3-year-old grandson back in May.  She thought she was going to have to go to the emergency room, but symptoms ultimately resolved and never returned.  She is not sure if it was secondary to anxiety.  She did tell her primary care doctor who has referred her to cardiology. She has an appointment on 8/14 with Dr. Antoine Poche.   Past Medical History:  Diagnosis Date   Allergy    Anxiety    Blood transfusion without reported diagnosis    Breast cancer (HCC)    Breast cancer (HCC) 04/21/2012   Stage II (T1c N1 M0) grade 3 triple negative breast cancer, right- sided with 1 of 5 positive nodes, status post FEC in a dose dense fashion for 6 cycles followed by radiation therapy by Dr. Roselind Messier for her high-risk disease with her initial date of  surgery in February 2008.    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   COPD (chronic obstructive pulmonary disease) (HCC)    Depression    Emphysema of lung (HCC)    GERD (gastroesophageal reflux disease) 12/18/2012   Headache    Hyperlipidemia    Neuropathy    Osteoporosis    PONV (postoperative nausea and vomiting)    PTSD (post-traumatic stress disorder)    Substance abuse [facility])     Past Surgical History:  Procedure Laterality Date   BIOPSY  11/05/2017   Procedure: BIOPSY;  Surgeon: West Bali, MD;   Location: [facility];  Service: Endoscopy;;  colon duodenum gastric   BREAST SURGERY     CATARACT EXTRACTION W/PHACO Left 08/15/2017   Procedure: CATARACT EXTRACTION WITH PHACOEMULSIFICATION  AND INTRAOCULAR LENS PLACEMENT LEFT EYE CDE=2.68;  Surgeon: Fabio Pierce, MD;  Location: [facility];  Service: Ophthalmology;  Laterality: Left;  left   CATARACT EXTRACTION W/PHACO Right 09/06/2017   Procedure: CATARACT EXTRACTION PHACO AND INTRAOCULAR LENS PLACEMENT (IOC);  Surgeon: Fabio Pierce, MD;  Location: [facility];  Service: Ophthalmology;  Laterality: Right;  CDE: 1.61   COLONOSCOPY WITH PROPOFOL N/A 11/05/2017    6 mm polyp in the sigmoid colon which was sessile and removed.  Rectosigmoid colon and sigmoid colon biopsies taken for evaluation for microscopic colitis.  Intermittent rectal bleeding due to sigmoid colon polyp and internal hemorrhoids. Normal colonic biopsies. Tubular adenoma. Repeat surveillance 5-10 years.    ESOPHAGOGASTRODUODENOSCOPY  03/10/2008   SLF: Normal esophagus without evidence of Barrett, mass, erosion  ulceration, or stricture, small bowel bx negative, gastritis with NO h.pylori   ESOPHAGOGASTRODUODENOSCOPY (EGD) WITH PROPOFOL N/A 11/05/2017   Mild gastritis and duodenitis.    leocolonoscopy  03/10/2008   XBM:WUXLKG terminal ileum, approximately 10 cm visualized/Normal colon without evidence of polyps, mass, inflammatory changes, diverticula, or AVMs/Normal retroflexed view of the rectum. random colon bx negative.   MASTECTOMY     bilateral, status post reconstruction.   POLYPECTOMY  11/05/2017   Procedure: POLYPECTOMY;  Surgeon: West Bali, MD;  Location: [facility];  Service: Endoscopy;;  colon   TUBAL LIGATION      Current Outpatient Medications  Medication Sig Dispense Refill   albuterol (VENTOLIN HFA) 108 (90 Base) MCG/ACT inhaler INHALE 2 PUFFS BY MOUTH EVERY 6 HOURS AS NEEDED FOR WHEEZING FOR SHORTNESS OF BREATH 9 g 2   cholecalciferol (VITAMIN D) 1000 units  tablet Take 1,000 Units by mouth daily.     citalopram (CELEXA) 20 MG tablet Take 1 tablet (20 mg total) by mouth daily. 30 tablet 2   diazepam (VALIUM) 10 MG tablet Take 1 tablet (10 mg total) by mouth 3 (three) times daily. 90 tablet 2   fexofenadine (ALLEGRA) 180 MG tablet Take 1 tablet (180 mg total) by mouth daily as needed for allergies or rhinitis. 90 tablet 3   omeprazole (PRILOSEC) 40 MG capsule Take 1 capsule (40 mg total) by mouth daily. 90 capsule 0   pravastatin (PRAVACHOL) 20 MG tablet Take 1 tablet (20 mg total) by mouth daily. 90 tablet 3   prazosin (MINIPRESS) 2 MG capsule Take 1 capsule (2 mg total) by mouth at bedtime. 30 capsule 2   temazepam (RESTORIL) 30 MG capsule Take 1 capsule (30 mg total) by mouth at bedtime as needed for sleep. 30 capsule 2   dicyclomine (BENTYL) 10 MG capsule Take 1 capsule (10 mg total) by mouth 3 (three) times daily before meals. 90 capsule 3  No current facility-administered medications for this visit.    Allergies as of 12/17/2022 - Review Complete 12/17/2022  Allergen Reaction Noted   Vicodin [hydrocodone-acetaminophen] Nausea Only 07/15/2013    Family History  Problem Relation Age of Onset   COPD Mother    Depression Mother    Heart disease Mother    Hyperlipidemia Mother    Hypertension Mother    Cancer Mother        lung cancer, to bone   Cancer Father        skin   Heart disease Father    Hypertension Father    Hyperlipidemia Father    Learning disabilities Father    Drug abuse Brother        overdose at 93   Drug abuse Daughter        drug overdose 32   Cancer Maternal Uncle        lung cancer   Crohn's disease Cousin    Crohn's disease Maternal Aunt    Colon cancer Neg Hx     Social History   Socioeconomic History   Marital status: Divorced    Spouse name: Not on file   Number of children: 2   Years of education: 13   Highest education level: Not on file  Occupational History   Occupation: disabled     Comment: neuropathy from chemo  Tobacco Use   Smoking status: Every Day    Current packs/day: 1.00    Average packs/day: 1 pack/day for 30.6 years (30.6 ttl pk-yrs)    Types: Cigarettes    Start date: 05/07/1992   Smokeless tobacco: Never   Tobacco comments:    discussed  Vaping Use   Vaping status: Never Used  Substance and Sexual Activity   Alcohol use: Not Currently    Comment: 1 beer a night.   Drug use: Yes    Types: Marijuana   Sexual activity: Yes    Birth control/protection: Post-menopausal  Other Topics Concern   Not on file  Social History Narrative   Divorced   Lives with significant other, both smoke   One child passed and one still living, lives in [place], reports sees regularly      Social Determinants of Health   Financial Resource Strain: Low Risk  (09/26/2022)   Overall Financial Resource Strain (CARDIA)    Difficulty of Paying Living Expenses: Not hard at all  Food Insecurity: No Food Insecurity (09/26/2022)   Hunger Vital Sign    Worried About Running Out of Food in the Last Year: Never true    Ran Out of Food in the Last Year: Never true  Transportation Needs: No Transportation Needs (09/26/2022)   PRAPARE - Administrator, Civil Service (Medical): No    Lack of Transportation (Non-Medical): No  Physical Activity: Inactive (09/26/2022)   Exercise Vital Sign    Days of Exercise per Week: 0 days    Minutes of Exercise per Session: 0 min  Stress: No Stress Concern Present (09/26/2022)   [employer] of Occupational Health - Occupational Stress Questionnaire    Feeling of Stress : Not at all  Social Connections: Moderately Isolated (09/26/2022)   Social Connection and Isolation Panel [NHANES]    Frequency of Communication with Friends and Family: More than three times a week    Frequency of Social Gatherings with Friends and Family: Once a week    Attends Religious Services: Never    Database administrator or Organizations: No  Attends Banker Meetings: Never    Marital Status: Living with partner    Review of Systems: Gen: Denies fever, chills, cold or flulike symptoms, presyncope, syncope. CV: Denies chest pain, palpitations. Resp: Denies dyspnea, cough. GI: See HPI Heme: See HPI  Physical Exam: BP 110/64 (BP Location: Left Arm, Patient Position: Sitting, Cuff Size: Normal)   Pulse 79   Temp 97.8 F (36.6 C) (Temporal)   Ht 5\' 4"  (1.626 m)   Wt 144 lb 3.2 oz (65.4 kg)   LMP 07/09/2013   SpO2 96%   BMI 24.75 kg/m  General:   Alert and oriented. No distress noted. Pleasant and cooperative.  Head:  Normocephalic and atraumatic. Eyes:  Conjuctiva clear without scleral icterus. Heart:  S1, S2 present without murmurs appreciated. Lungs:  Clear to auscultation bilaterally. No wheezes, rales, or rhonchi. No distress.  Abdomen:  +BS, soft, non-tender and non-distended. No rebound or guarding. No HSM or masses noted. Msk:  Symmetrical without gross deformities. Normal posture. Extremities:  Without edema. Neurologic:  Alert and  oriented x4 Psych:  Normal mood and affect.    Assessment:  62 year old female with history of anxiety/depression, breast cancer, COPD, HLD, adenomatous colon polyps due for surveillance at this time, chronic diarrhea suspected to be secondary to IBS-ED, GERD, gastritis, duodenitis, presenting today to discuss scheduling surveillance colonoscopy.  History of adenomatous colon polyps: Currently due for surveillance.  Last colonoscopy was in 2019 with one 6 mm tubular adenoma removed.  Colon biopsies were negative for microscopic colitis.  Recommended repeat colonoscopy in 5-10 years.  Current without BRBPR or melena.  Per chart review, patient has lost a significant amount of weight over the last year and a half which she reports is unintentional as discussed below.    Chronic diarrhea: Suspected to be secondary to IBS-D.  Prior evaluation included colon biopsies  negative for microscopic colitis, duodenal biopsies benign, stool studies negative in 2019, TSH previously within normal limits. Notably, at last visit, patient reported she had been doing well off dicyclomine for a while, but symptoms returned around June 2023 with significant watery diarrhea.  I had recommended stool studies back then, but these were never completed.  Dicyclomine 10 mg 3 times daily was resumed and diarrhea is now fairly well-controlled, having 1-2 bowel movements daily.  We will hold off on stool studies for now.  We are proceeding with surveillance colonoscopy in the near future which will help to reevaluate diarrhea.  Weight loss: Patient lost a significant amount of weight (21 pounds) between January 2023 and September 2023.  She lost an additional 10 pounds between September 2023 and May 2024, but has gained 2 pounds back since that time.  She denies any change in oral intake or activity or any significant upper GI symptoms. She does have chronic diarrhea which is currently fairly well controlled with dicyclomine TID. We are planning to proceed with a colonoscopy in the near future. Could update EGD in the future if colonoscopy is unrevealing and weight loss continues.    Plan:  Proceed with colonoscopy with propofol by Dr. Marletta Lor in near future once cleared by cardiology due to episode of chest pain. The risks, benefits, and alternatives have been discussed with the patient in detail. The patient states understanding and desires to proceed.  ASA 3 Continue dicyclomine 10 mg 3 times daily. Follow-up after colonoscopy.   Ermalinda Memos, PA-C [facility] 12/17/2022

## 2022-12-17 ENCOUNTER — Telehealth: Payer: Self-pay | Admitting: *Deleted

## 2022-12-17 ENCOUNTER — Ambulatory Visit (INDEPENDENT_AMBULATORY_CARE_PROVIDER_SITE_OTHER): Payer: Medicare HMO | Admitting: Gastroenterology

## 2022-12-17 ENCOUNTER — Encounter: Payer: Self-pay | Admitting: Gastroenterology

## 2022-12-17 VITALS — BP 110/64 | HR 79 | Temp 97.8°F | Ht 64.0 in | Wt 144.2 lb

## 2022-12-17 DIAGNOSIS — R634 Abnormal weight loss: Secondary | ICD-10-CM

## 2022-12-17 DIAGNOSIS — R109 Unspecified abdominal pain: Secondary | ICD-10-CM | POA: Diagnosis not present

## 2022-12-17 DIAGNOSIS — R197 Diarrhea, unspecified: Secondary | ICD-10-CM | POA: Diagnosis not present

## 2022-12-17 DIAGNOSIS — R072 Precordial pain: Secondary | ICD-10-CM | POA: Insufficient documentation

## 2022-12-17 DIAGNOSIS — Z86010 Personal history of colonic polyps: Secondary | ICD-10-CM | POA: Diagnosis not present

## 2022-12-17 MED ORDER — DICYCLOMINE HCL 10 MG PO CAPS
10.0000 mg | ORAL_CAPSULE | Freq: Three times a day (TID) | ORAL | 3 refills | Status: DC
Start: 2022-12-17 — End: 2023-06-26

## 2022-12-17 NOTE — Telephone Encounter (Signed)
Patient has initial visit on 12/19/22 with cardiology.     Request for patient to stop medication prior to procedure or is needing cleareance  12/17/22  CYDNIE WARMUTH 06-28-1960  What type of surgery is being performed? Colonoscopy  When is surgery scheduled? TBD  What type of clearance is required (medical or pharmacy to hold medication or both? medical  Patient needs cardiology clearance due to episode of chest pain prior to being scheduled for procedure.    Name of physician performing surgery?  Dr.Carver [facility] at [employer]: [phone] Fax: [phone]  Anethesia type (none, local, MAC, general)? MAC

## 2022-12-17 NOTE — Telephone Encounter (Signed)
   Name: [name]  DOB: [birth year]  MRN: [id number]  Primary Cardiologist: None  Chart reviewed as part of pre-operative protocol coverage. Because of [name]'s past medical history and time since last visit, she will require a follow-up in-office visit in order to better assess preoperative cardiovascular risk.  Pre-op covering staff: - Please schedule appointment and call patient to inform them. If patient already had an upcoming appointment within acceptable timeframe, please add "pre-op clearance" to the appointment notes so provider is aware. - Please contact requesting surgeon's office via preferred method (i.e, phone, fax) to inform them of need for appointment prior to surgery.  No medications indicated as needing held.  Sharlene Dory, PA-C  12/17/2022, 4:50 PM

## 2022-12-17 NOTE — Progress Notes (Unsigned)
Cardiology Office Note   Date:  12/19/2022   ID:  [name], DOB 1961/02/23, MRN [id number]  PCP:  Raliegh Ip, DO  Cardiologist:   None Referring:  Raliegh Ip, DO  Chief Complaint  Patient presents with   Chest Pain      History of Present Illness: [name] is a 62 y.o. female who presents for evaluation of chest pain.  The patient has no past cardiac history.  About a month ago she developed chest discomfort.  She was picking up, in her car, her grandchild so she was not really doing anything exerting.  She developed mid substernal chest pressure.  She said it was 10 out of 10 in intensity.  It only lasted for a few seconds.  There did not seem to be any radiation to her jaw or to her arms.  She never had this before.  She did some deep breathing and it went away.  She might of been a little sweaty.  She was short of breath.  She did not have nausea or throw up.  She has not done that since.  She does get some chronic shortness of breath vacuuming or going to the mailbox but this has not been anything new.  She has chronic smoking history she does have a history of COPD and emphysema by report.  She is not having any resting shortness of breath, PND or orthopnea.  Her describing any palpitations, presyncope or syncope.  She has had no weight gain or edema.  She has never had any prior cardiac testing.  She does have significant family risk factors.   Past Medical History:  Diagnosis Date   Allergy    Anxiety    Blood transfusion without reported diagnosis    Breast cancer (HCC)    Breast cancer (HCC) 04/21/2012   Stage II (T1c N1 M0) grade 3 triple negative breast cancer, right- sided with 1 of 5 positive nodes, status post FEC in a dose dense fashion for 6 cycles followed by radiation therapy by Dr. Roselind Messier for her high-risk disease with her initial date of surgery in February 2008.    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   COPD (chronic  obstructive pulmonary disease) (HCC)    Depression    Emphysema of lung (HCC)    GERD (gastroesophageal reflux disease) 12/18/2012   Headache    Hyperlipidemia    Neuropathy    Osteoporosis    PONV (postoperative nausea and vomiting)    PTSD (post-traumatic stress disorder)    Substance abuse [facility])     Past Surgical History:  Procedure Laterality Date   BIOPSY  11/05/2017   Procedure: BIOPSY;  Surgeon: West Bali, MD;  Location: [facility];  Service: Endoscopy;;  colon duodenum gastric   BREAST SURGERY     CATARACT EXTRACTION W/PHACO Left 08/15/2017   Procedure: CATARACT EXTRACTION WITH PHACOEMULSIFICATION  AND INTRAOCULAR LENS PLACEMENT LEFT EYE CDE=2.68;  Surgeon: Fabio Pierce, MD;  Location: [facility] ORS;  Service: Ophthalmology;  Laterality: Left;  left   CATARACT EXTRACTION W/PHACO Right 09/06/2017   Procedure: CATARACT EXTRACTION PHACO AND INTRAOCULAR LENS PLACEMENT (IOC);  Surgeon: Fabio Pierce, MD;  Location: [facility];  Service: Ophthalmology;  Laterality: Right;  CDE: 1.61   COLONOSCOPY WITH PROPOFOL N/A 11/05/2017    6 mm polyp in the sigmoid colon which was sessile and removed.  Rectosigmoid colon and sigmoid colon biopsies taken for evaluation for microscopic colitis.  Intermittent rectal bleeding  due to sigmoid colon polyp and internal hemorrhoids. Normal colonic biopsies. Tubular adenoma. Repeat surveillance 5-10 years.    ESOPHAGOGASTRODUODENOSCOPY  03/10/2008   SLF: Normal esophagus without evidence of Barrett, mass, erosion  ulceration, or stricture, small bowel bx negative, gastritis with NO h.pylori   ESOPHAGOGASTRODUODENOSCOPY (EGD) WITH PROPOFOL N/A 11/05/2017   Mild gastritis and duodenitis.    leocolonoscopy  03/10/2008   LPF:XTKWIO terminal ileum, approximately 10 cm visualized/Normal colon without evidence of polyps, mass, inflammatory changes, diverticula, or AVMs/Normal retroflexed view of the rectum. random colon bx negative.   MASTECTOMY     bilateral, status  post reconstruction.   POLYPECTOMY  11/05/2017   Procedure: POLYPECTOMY;  Surgeon: West Bali, MD;  Location: [facility];  Service: Endoscopy;;  colon   TUBAL LIGATION       Current Outpatient Medications  Medication Sig Dispense Refill   albuterol (VENTOLIN HFA) 108 (90 Base) MCG/ACT inhaler INHALE 2 PUFFS BY MOUTH EVERY 6 HOURS AS NEEDED FOR WHEEZING FOR SHORTNESS OF BREATH 9 g 2   cholecalciferol (VITAMIN D) 1000 units tablet Take 1,000 Units by mouth daily.     citalopram (CELEXA) 20 MG tablet Take 1 tablet (20 mg total) by mouth daily. 30 tablet 2   diazepam (VALIUM) 10 MG tablet Take 1 tablet (10 mg total) by mouth 3 (three) times daily. 90 tablet 2   dicyclomine (BENTYL) 10 MG capsule Take 1 capsule (10 mg total) by mouth 3 (three) times daily before meals. 90 capsule 3   fexofenadine (ALLEGRA) 180 MG tablet Take 1 tablet (180 mg total) by mouth daily as needed for allergies or rhinitis. 90 tablet 3   omeprazole (PRILOSEC) 40 MG capsule Take 1 capsule (40 mg total) by mouth daily. 90 capsule 0   pravastatin (PRAVACHOL) 20 MG tablet Take 1 tablet (20 mg total) by mouth daily. 90 tablet 3   prazosin (MINIPRESS) 2 MG capsule Take 1 capsule (2 mg total) by mouth at bedtime. 30 capsule 2   temazepam (RESTORIL) 30 MG capsule Take 1 capsule (30 mg total) by mouth at bedtime as needed for sleep. 30 capsule 2   No current facility-administered medications for this visit.    Allergies:   Vicodin [hydrocodone-acetaminophen]    Social History:  The patient  reports that she has been smoking cigarettes. She started smoking about 30 years ago. She has a 30.6 pack-year smoking history. She has never used smokeless tobacco. She reports that she does not currently use alcohol. She reports current drug use. Drug: Marijuana.   Family History:  The patient's family history includes COPD in her mother; Cancer in her father, maternal uncle, and mother; Crohn's disease in her cousin and maternal  aunt; Depression in her mother; Drug abuse in her brother and daughter; Heart disease (age of onset: 76) in her father and mother; Hyperlipidemia in her father and mother; Hypertension in her father and mother; Learning disabilities in her father.    ROS:  Please see the history of present illness.   Otherwise, review of systems are positive for none.   All other systems are reviewed and negative.    PHYSICAL EXAM: VS:  BP 98/60   Pulse 80   Ht 5\' 4"  (1.626 m)   Wt 145 lb (65.8 kg)   LMP 07/09/2013   BMI 24.89 kg/m  , BMI Body mass index is 24.89 kg/m. GENERAL:  Well appearing HEENT:  Pupils equal round and reactive, fundi not visualized, oral mucosa unremarkable NECK:  No jugular venous distention, waveform within normal limits, carotid upstroke brisk and symmetric, no bruits, no thyromegaly LYMPHATICS:  No cervical, inguinal adenopathy LUNGS:  Clear to auscultation bilaterally BACK:  No CVA tenderness CHEST:  Unremarkable HEART:  PMI not displaced or sustained,S1 and S2 within normal limits, no S3, no S4, no clicks, no rubs, no murmurs ABD:  Flat, positive bowel sounds normal in frequency in pitch, no bruits, no rebound, no guarding, no midline pulsatile mass, no hepatomegaly, no splenomegaly EXT:  2 plus pulses throughout, no edema, no cyanosis no clubbing SKIN:  No rashes no nodules NEURO:  Cranial nerves II through XII grossly intact, motor grossly intact throughout [facility]:  Cognitively intact, oriented to person place and time    EKG:  EKG Interpretation Date/Time:  Wednesday December 19 2022 11:00:49 EDT Ventricular Rate:  77 PR Interval:  140 QRS Duration:  80 QT Interval:  400 QTC Calculation: 452 R Axis:   68  Text Interpretation: Normal sinus rhythm Normal ECG When compared with ECG of 27-May-2015 07:13, No significant change since last tracing Confirmed by Rollene Rotunda ([id number]) on 12/19/2022 11:16:33 AM     Recent Labs: 09/21/2022: ALT 18; BUN 8; Creatinine, Ser  0.58; Hemoglobin 15.3; Platelets 358; Potassium 4.8; Sodium 138; TSH 2.480    Lipid Panel    Component Value Date/Time   CHOL 172 12/18/2019 1556   TRIG 110 12/18/2019 1556   HDL 53 12/18/2019 1556   CHOLHDL 3.2 12/18/2019 1556   CHOLHDL 3.4 07/08/2017 1101   VLDL 42 (H) 11/23/2016 1128   LDLCALC 99 12/18/2019 1556   LDLCALC 103 (H) 07/08/2017 1101      Wt Readings from Last 3 Encounters:  12/19/22 145 lb (65.8 kg)  12/17/22 144 lb 3.2 oz (65.4 kg)  10/05/22 141 lb (64 kg)      Other studies Reviewed: Additional studies/ records that were reviewed today include: Labs he is scheduled to have. Review of the above records demonstrates:  Please see elsewhere in the note.  I will ask   ASSESSMENT AND PLAN:  Chest pain:   This is nonanginal greater than anginal features but she does have significant cardiovascular risk factors. I will bring the patient back for a POET (Plain Old Exercise Test). This will allow me to screen for obstructive coronary disease, risk stratify and very importantly provide a prescription for exercise.  I have also suggested a coronary calcium score.  She needs aggressive risk reduction.  Tobacco abuse: She does not think she would be able to quit smoking we did talk about this.  Risk reduction: I will check a lipid profile and LP(a)  Preop: She is post to get colonoscopy and if she has normal POET (Plain Old Exercise Treadmill) I think that would be reasonable.   Current medicines are reviewed at length with the patient today.  The patient does not have concerns regarding medicines.  The following changes have been made:  no change  Labs/ tests ordered today include: None  Orders Placed This Encounter  Procedures   CT CARDIAC SCORING (SELF PAY ONLY)   Lipid panel   Lipoprotein A (LPA)   EXERCISE TOLERANCE TEST (ETT)   EKG 12-Lead     Disposition:   FU with me based on the results of the above.   Signed, Rollene Rotunda, MD  12/19/2022  11:52 AM    [facility]

## 2022-12-17 NOTE — Patient Instructions (Addendum)
We will get you scheduled for a colonoscopy as soon as you are cleared by cardiology for your prior episode of chest pain.  Continue dicyclomine 3 times a day to control your diarrhea.   You have an appointment with Dr. Rollene Rotunda (cardiologist) on 8/14 at 11 am.  I recommend that you call the number below to confirm the time and location.  [address] [place], [place] [address] [phone]  Ermalinda Memos, PA-C [facility]

## 2022-12-19 ENCOUNTER — Other Ambulatory Visit: Payer: Self-pay | Admitting: *Deleted

## 2022-12-19 ENCOUNTER — Encounter: Payer: Self-pay | Admitting: Cardiology

## 2022-12-19 ENCOUNTER — Ambulatory Visit (INDEPENDENT_AMBULATORY_CARE_PROVIDER_SITE_OTHER): Payer: Medicare HMO | Admitting: Cardiology

## 2022-12-19 VITALS — BP 98/60 | HR 80 | Ht 64.0 in | Wt 145.0 lb

## 2022-12-19 DIAGNOSIS — E785 Hyperlipidemia, unspecified: Secondary | ICD-10-CM

## 2022-12-19 DIAGNOSIS — R072 Precordial pain: Secondary | ICD-10-CM

## 2022-12-19 DIAGNOSIS — R079 Chest pain, unspecified: Secondary | ICD-10-CM | POA: Diagnosis not present

## 2022-12-19 DIAGNOSIS — F17210 Nicotine dependence, cigarettes, uncomplicated: Secondary | ICD-10-CM

## 2022-12-19 DIAGNOSIS — Z01810 Encounter for preprocedural cardiovascular examination: Secondary | ICD-10-CM | POA: Diagnosis not present

## 2022-12-19 DIAGNOSIS — Z01818 Encounter for other preprocedural examination: Secondary | ICD-10-CM | POA: Diagnosis not present

## 2022-12-19 NOTE — Patient Instructions (Signed)
Medication Instructions:  The current medical regimen is effective;  continue present plan and medications.  *If you need a refill on your cardiac medications before your next appointment, please call your pharmacy*   Lab Work: Please have blood work at your closest [employer] [facility] [employer] (lipid, [facility])  If you have labs (blood work) drawn today and your tests are completely normal, you will receive your results only by: MyChart Message (if you have MyChart) OR A paper copy in the mail If you have any lab test that is abnormal or we need to change your treatment, we will call you to review the results.   Testing/Procedures: Your physician has requested that you have an exercise tolerance test.  Please also follow instruction sheet, as given. Please report to the Main Entrance of [facility] the day of your test to check in.  You will be contacted to be scheduled.  Your physician has requested that you have a Coronary Calcium score which is completed by CT. Cardiac computed tomography (CT) is a painless test that uses an x-ray machine to take clear, detailed pictures of your heart. There are no instructions for this testing.  You may eat/drink and take your normal medications this day.  The cost of the testing is $99 due at the time of your appointment. You will be contacted to be scheduled for this testing.   Follow-Up: At [facility], you and your health needs are our priority.  As part of our continuing mission to provide you with exceptional heart care, we have created designated Provider Care Teams.  These Care Teams include your primary Cardiologist (physician) and Advanced Practice Providers (APPs -  Physician Assistants and Nurse Practitioners) who all work together to provide you with the care you need, when you need it.  We recommend signing up for the patient portal called "MyChart".  Sign up information is provided on this After Visit Summary.  MyChart is used to connect with patients  for Virtual Visits (Telemedicine).  Patients are able to view lab/test results, encounter notes, upcoming appointments, etc.  Non-urgent messages can be sent to your provider as well.   To learn more about what you can do with MyChart, go to [email or web address].    Your next appointment:   Follow up will be based on the results of the above testing.

## 2022-12-21 ENCOUNTER — Ambulatory Visit: Payer: Medicare HMO | Admitting: Family Medicine

## 2022-12-26 ENCOUNTER — Ambulatory Visit (HOSPITAL_COMMUNITY): Payer: Medicare HMO | Attending: Cardiology

## 2022-12-26 ENCOUNTER — Telehealth (HOSPITAL_COMMUNITY): Payer: Self-pay | Admitting: Emergency Medicine

## 2022-12-26 NOTE — Telephone Encounter (Signed)
Pt left message yesterday about rescheduling her appointment for GXT.  I returned her called, left message to contact Dr Hochrein's office to reschedule her appointment.

## 2023-01-09 ENCOUNTER — Telehealth (HOSPITAL_COMMUNITY): Payer: Self-pay | Admitting: Surgery

## 2023-01-10 ENCOUNTER — Ambulatory Visit (HOSPITAL_COMMUNITY)
Admission: RE | Admit: 2023-01-10 | Discharge: 2023-01-10 | Disposition: A | Discharge status: Home / Self Care | Payer: Medicare HMO | Source: Ambulatory Visit | Attending: Cardiology | Admitting: Cardiology

## 2023-01-10 DIAGNOSIS — R072 Precordial pain: Secondary | ICD-10-CM | POA: Diagnosis not present

## 2023-01-10 DIAGNOSIS — Z01810 Encounter for preprocedural cardiovascular examination: Secondary | ICD-10-CM | POA: Diagnosis not present

## 2023-01-10 DIAGNOSIS — Z01818 Encounter for other preprocedural examination: Secondary | ICD-10-CM | POA: Insufficient documentation

## 2023-01-10 LAB — EXERCISE TOLERANCE TEST
Angina Index: 0
Duke Treadmill Score: 6
Estimated workload: 7
Exercise duration (min): 5 min
Exercise duration (sec): 46 s
MPHR: 160 {beats}/min
Peak HR: 153 {beats}/min
Percent HR: 95 %
RPE: 17
Rest HR: 73 {beats}/min
ST Depression (mm): 0 mm

## 2023-01-21 ENCOUNTER — Encounter: Payer: Self-pay | Admitting: *Deleted

## 2023-01-21 ENCOUNTER — Telehealth: Payer: Self-pay | Admitting: Gastroenterology

## 2023-01-21 ENCOUNTER — Telehealth: Payer: Self-pay | Admitting: Cardiology

## 2023-01-21 NOTE — Telephone Encounter (Signed)
Patient said that she was going to have a stress test and after that we were going to schedule her for a colonoscopy.  She said she already had the stress test and is now ready to be scheduled for TCS.

## 2023-01-21 NOTE — Telephone Encounter (Signed)
Call to patient with results. She is wanting them sent to another office as well.  Advised to get a fax number  or if they are in Epic they can see them there. She states she will check with them.

## 2023-01-21 NOTE — Telephone Encounter (Signed)
Routing to [name] to follow-up on cardiac clearance now that stress test is complete. Stress test was negative.

## 2023-01-21 NOTE — Telephone Encounter (Signed)
Patient is requesting a call back to discuss stress test results.

## 2023-01-22 ENCOUNTER — Other Ambulatory Visit: Payer: Self-pay | Admitting: *Deleted

## 2023-01-22 NOTE — Telephone Encounter (Signed)
Request for patient to stop medication prior to procedure or is needing cleareance  01/22/23  [name] 09-26-1960  What type of surgery is being performed? Colonoscopy   When is surgery scheduled? TBD  What type of clearance is required (medical or pharmacy to hold medication or both? Medical   Patient needs to have cardiac clearance prior to being scheduled for procedure.   Name of physician performing surgery?  Dr.Carver [facility] at [employer]: [phone] Fax: [phone]  Anethesia type (none, local, MAC, general)? MAC

## 2023-01-23 NOTE — Telephone Encounter (Signed)
Patient Name: [name]  DOB: 1960/09/23 MRN: [id number]  Primary Cardiologist: None  Chart reviewed as part of pre-operative protocol coverage. Pre-op clearance already addressed by colleagues in earlier phone notes. To summarize recommendations:  -Per Dr. Jenene Slicker note states patient can continue with colonoscopy if negative stress test.  "Negative stress test.  Coronary calcium score is pending.  No change in therapy at this time." -Dr. Antoine Poche  No medications indicated as needing held.  Will route this bundled recommendation to requesting provider via Epic fax function and remove from pre-op pool. Please call with questions.  Sharlene Dory, PA-C 01/23/2023, 7:44 AM

## 2023-01-30 NOTE — Telephone Encounter (Signed)
Okay to proceed with scheduling

## 2023-01-30 NOTE — Telephone Encounter (Signed)
Please advise. Thank you

## 2023-01-31 ENCOUNTER — Encounter: Payer: Self-pay | Admitting: *Deleted

## 2023-01-31 ENCOUNTER — Other Ambulatory Visit: Payer: Self-pay | Admitting: *Deleted

## 2023-01-31 ENCOUNTER — Telehealth: Payer: Self-pay | Admitting: *Deleted

## 2023-01-31 MED ORDER — PEG 3350-KCL-NA BICARB-NACL 420 G PO SOLR: 4000.0000 mL | Freq: Once | ORAL | 0 refills | Status: AC

## 2023-01-31 NOTE — Telephone Encounter (Signed)
error 

## 2023-01-31 NOTE — Telephone Encounter (Signed)
[facility]  TCS w/Dr.Carver, asa 3

## 2023-01-31 NOTE — Telephone Encounter (Signed)
Pt has been scheduled for 03/04/23. Instructions mailed and prep sent to the pharmacy.

## 2023-01-31 NOTE — Telephone Encounter (Signed)
Pt called and lmom for you to return her call.

## 2023-01-31 NOTE — Telephone Encounter (Signed)
[employer] PA: Approved Authorization #[id number]  Tracking #[id number] Dates of service 03/04/2023 - 06/03/2023

## 2023-02-11 ENCOUNTER — Ambulatory Visit: Payer: Medicare HMO | Admitting: Family Medicine

## 2023-02-11 ENCOUNTER — Encounter: Payer: Self-pay | Admitting: Family Medicine

## 2023-02-28 ENCOUNTER — Encounter (HOSPITAL_COMMUNITY): Payer: Self-pay

## 2023-02-28 ENCOUNTER — Encounter (HOSPITAL_COMMUNITY)
Admission: RE | Admit: 2023-02-28 | Discharge: 2023-02-28 | Disposition: A | Discharge status: Home / Self Care | Payer: Medicare HMO | Source: Ambulatory Visit | Attending: Internal Medicine | Admitting: Internal Medicine

## 2023-02-28 ENCOUNTER — Other Ambulatory Visit: Payer: Self-pay

## 2023-02-28 HISTORY — DX: Pure hypercholesterolemia, unspecified: E78.00

## 2023-03-04 ENCOUNTER — Ambulatory Visit (HOSPITAL_COMMUNITY)
Admission: RE | Admit: 2023-03-04 | Discharge: 2023-03-04 | Disposition: A | Discharge status: Home / Self Care | Payer: Medicare HMO | Attending: Internal Medicine | Admitting: Internal Medicine

## 2023-03-04 ENCOUNTER — Ambulatory Visit (HOSPITAL_COMMUNITY): Payer: Medicare HMO | Admitting: Certified Registered"

## 2023-03-04 ENCOUNTER — Ambulatory Visit (HOSPITAL_BASED_OUTPATIENT_CLINIC_OR_DEPARTMENT_OTHER): Payer: Medicare HMO | Admitting: Certified Registered"

## 2023-03-04 ENCOUNTER — Encounter (HOSPITAL_COMMUNITY)
Admission: RE | Disposition: A | Discharge status: Home / Self Care | Payer: Self-pay | Source: Home / Self Care | Attending: Internal Medicine

## 2023-03-04 ENCOUNTER — Encounter (HOSPITAL_COMMUNITY): Payer: Self-pay

## 2023-03-04 DIAGNOSIS — Z1211 Encounter for screening for malignant neoplasm of colon: Secondary | ICD-10-CM | POA: Insufficient documentation

## 2023-03-04 DIAGNOSIS — Z9221 Personal history of antineoplastic chemotherapy: Secondary | ICD-10-CM | POA: Diagnosis not present

## 2023-03-04 DIAGNOSIS — F17200 Nicotine dependence, unspecified, uncomplicated: Secondary | ICD-10-CM | POA: Diagnosis not present

## 2023-03-04 DIAGNOSIS — Z853 Personal history of malignant neoplasm of breast: Secondary | ICD-10-CM | POA: Diagnosis not present

## 2023-03-04 DIAGNOSIS — K5730 Diverticulosis of large intestine without perforation or abscess without bleeding: Secondary | ICD-10-CM | POA: Insufficient documentation

## 2023-03-04 DIAGNOSIS — Z860101 Personal history of adenomatous and serrated colon polyps: Secondary | ICD-10-CM | POA: Insufficient documentation

## 2023-03-04 DIAGNOSIS — K648 Other hemorrhoids: Secondary | ICD-10-CM | POA: Diagnosis not present

## 2023-03-04 DIAGNOSIS — G629 Polyneuropathy, unspecified: Secondary | ICD-10-CM | POA: Diagnosis not present

## 2023-03-04 DIAGNOSIS — F1290 Cannabis use, unspecified, uncomplicated: Secondary | ICD-10-CM | POA: Insufficient documentation

## 2023-03-04 DIAGNOSIS — J449 Chronic obstructive pulmonary disease, unspecified: Secondary | ICD-10-CM | POA: Diagnosis not present

## 2023-03-04 DIAGNOSIS — Z860100 Personal history of colon polyps, unspecified: Secondary | ICD-10-CM

## 2023-03-04 DIAGNOSIS — J439 Emphysema, unspecified: Secondary | ICD-10-CM | POA: Diagnosis not present

## 2023-03-04 DIAGNOSIS — F413 Other mixed anxiety disorders: Secondary | ICD-10-CM

## 2023-03-04 DIAGNOSIS — K219 Gastro-esophageal reflux disease without esophagitis: Secondary | ICD-10-CM | POA: Diagnosis not present

## 2023-03-04 DIAGNOSIS — R634 Abnormal weight loss: Secondary | ICD-10-CM | POA: Diagnosis not present

## 2023-03-04 HISTORY — PX: BIOPSY: SHX5522

## 2023-03-04 HISTORY — PX: COLONOSCOPY WITH PROPOFOL: SHX5780

## 2023-03-04 SURGERY — COLONOSCOPY WITH PROPOFOL
Anesthesia: General

## 2023-03-04 MED ORDER — LIDOCAINE HCL (CARDIAC) PF 100 MG/5ML IV SOSY
PREFILLED_SYRINGE | INTRAVENOUS | Status: DC | PRN
  Administered 2023-03-04: 50 mg via INTRAVENOUS

## 2023-03-04 MED ORDER — PROPOFOL 10 MG/ML IV BOLUS
INTRAVENOUS | Status: DC | PRN
  Administered 2023-03-04: 60 mg via INTRAVENOUS
  Administered 2023-03-04: 50 mg via INTRAVENOUS
  Administered 2023-03-04: 40 mg via INTRAVENOUS
  Administered 2023-03-04: 50 mg via INTRAVENOUS
  Administered 2023-03-04: 100 mg via INTRAVENOUS

## 2023-03-04 MED ORDER — LACTATED RINGERS IV SOLN: INTRAVENOUS | Status: DC | PRN

## 2023-03-04 NOTE — Discharge Instructions (Signed)
  Colonoscopy Discharge Instructions  Read the instructions outlined below and refer to this sheet in the next few weeks. These discharge instructions provide you with general information on caring for yourself after you leave the hospital. Your doctor may also give you specific instructions. While your treatment has been planned according to the most current medical practices available, unavoidable complications occasionally occur.   ACTIVITY You may resume your regular activity, but move at a slower pace for the next 24 hours.  Take frequent rest periods for the next 24 hours.  Walking will help get rid of the air and reduce the bloated feeling in your belly (abdomen).  No driving for 24 hours (because of the medicine (anesthesia) used during the test).   Do not sign any important legal documents or operate any machinery for 24 hours (because of the anesthesia used during the test).  NUTRITION Drink plenty of fluids.  You may resume your normal diet as instructed by your doctor.  Begin with a light meal and progress to your normal diet. Heavy or fried foods are harder to digest and may make you feel sick to your stomach (nauseated).  Avoid alcoholic beverages for 24 hours or as instructed.  MEDICATIONS You may resume your normal medications unless your doctor tells you otherwise.  WHAT YOU CAN EXPECT TODAY Some feelings of bloating in the abdomen.  Passage of more gas than usual.  Spotting of blood in your stool or on the toilet paper.  IF YOU HAD POLYPS REMOVED DURING THE COLONOSCOPY: No aspirin products for 7 days or as instructed.  No alcohol for 7 days or as instructed.  Eat a soft diet for the next 24 hours.  FINDING OUT THE RESULTS OF YOUR TEST Not all test results are available during your visit. If your test results are not back during the visit, make an appointment with your caregiver to find out the results. Do not assume everything is normal if you have not heard from your  caregiver or the medical facility. It is important for you to follow up on all of your test results.  SEEK IMMEDIATE MEDICAL ATTENTION IF: You have more than a spotting of blood in your stool.  Your belly is swollen (abdominal distention).  You are nauseated or vomiting.  You have a temperature over 101.  You have abdominal pain or discomfort that is severe or gets worse throughout the day.   Overall, your colon appeared very healthy.  I did not see any active inflammation indicative of underlying inflammatory bowel disease such as Crohn's disease or ulcerative colitis throughout your colon or end portion of your small bowel.  I took biopsies of your colon to further evaluate.  Await pathology results, my office will contact you.  I did not find any polyps or evidence of colon cancer.  I recommend repeating colonoscopy in 10 years for colon cancer screening purposes.    Follow-up with GI in 2-3 months    I hope you have a great rest of your week!  Hennie Duos. Marletta Lor, D.O. Gastroenterology and Hepatology [facility]

## 2023-03-04 NOTE — H&P (Signed)
Primary Care Physician:  Raliegh Ip, DO Primary Gastroenterologist:  Dr. Marletta Lor  Pre-Procedure History & Physical: HPI:  [name] is a 62 y.o. female is here for a colonoscopy to be performed for surveillance purposes, personal history of adenomatous colon polyps in 2019  Past Medical History:  Diagnosis Date   Allergy    Anxiety    Blood transfusion without reported diagnosis    Breast cancer (HCC)    Breast cancer (HCC) 04/21/2012   Stage II (T1c N1 M0) grade 3 triple negative breast cancer, right- sided with 1 of 5 positive nodes, status post FEC in a dose dense fashion for 6 cycles followed by radiation therapy by Dr. Roselind Messier for her high-risk disease with her initial date of surgery in February 2008.    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   COPD (chronic obstructive pulmonary disease) (HCC)    Depression    Emphysema of lung (HCC)    GERD (gastroesophageal reflux disease) 12/18/2012   Headache    Hypercholesteremia    Hyperlipidemia    Neuropathy    Osteoporosis    PONV (postoperative nausea and vomiting)    PTSD (post-traumatic stress disorder)    Substance abuse [facility])     Past Surgical History:  Procedure Laterality Date   BIOPSY  11/05/2017   Procedure: BIOPSY;  Surgeon: West Bali, MD;  Location: [facility];  Service: Endoscopy;;  colon duodenum gastric   BREAST SURGERY     CATARACT EXTRACTION W/PHACO Left 08/15/2017   Procedure: CATARACT EXTRACTION WITH PHACOEMULSIFICATION  AND INTRAOCULAR LENS PLACEMENT LEFT EYE CDE=2.68;  Surgeon: Fabio Pierce, MD;  Location: [facility];  Service: Ophthalmology;  Laterality: Left;  left   CATARACT EXTRACTION W/PHACO Right 09/06/2017   Procedure: CATARACT EXTRACTION PHACO AND INTRAOCULAR LENS PLACEMENT (IOC);  Surgeon: Fabio Pierce, MD;  Location: [facility];  Service: Ophthalmology;  Laterality: Right;  CDE: 1.61   COLONOSCOPY WITH PROPOFOL N/A 11/05/2017    6 mm polyp in the sigmoid colon which was  sessile and removed.  Rectosigmoid colon and sigmoid colon biopsies taken for evaluation for microscopic colitis.  Intermittent rectal bleeding due to sigmoid colon polyp and internal hemorrhoids. Normal colonic biopsies. Tubular adenoma. Repeat surveillance 5-10 years.    ESOPHAGOGASTRODUODENOSCOPY  03/10/2008   SLF: Normal esophagus without evidence of Barrett, mass, erosion  ulceration, or stricture, small bowel bx negative, gastritis with NO h.pylori   ESOPHAGOGASTRODUODENOSCOPY (EGD) WITH PROPOFOL N/A 11/05/2017   Mild gastritis and duodenitis.    leocolonoscopy  03/10/2008   TDV:VOHYWV terminal ileum, approximately 10 cm visualized/Normal colon without evidence of polyps, mass, inflammatory changes, diverticula, or AVMs/Normal retroflexed view of the rectum. random colon bx negative.   MASTECTOMY     bilateral, status post reconstruction.   POLYPECTOMY  11/05/2017   Procedure: POLYPECTOMY;  Surgeon: West Bali, MD;  Location: [facility];  Service: Endoscopy;;  colon   TUBAL LIGATION      Prior to Admission medications   Medication Sig Start Date End Date Taking? Authorizing Provider  albuterol (VENTOLIN HFA) 108 (90 Base) MCG/ACT inhaler INHALE 2 PUFFS BY MOUTH EVERY 6 HOURS AS NEEDED FOR WHEEZING FOR SHORTNESS OF BREATH 05/22/21   Delynn Flavin M, DO  cholecalciferol (VITAMIN D) 1000 units tablet Take 1,000 Units by mouth daily.    [provider]  citalopram (CELEXA) 20 MG tablet Take 1 tablet (20 mg total) by mouth daily. 12/11/22 12/11/23  Myrlene Broker, MD  diazepam (VALIUM) 10 MG  tablet Take 1 tablet (10 mg total) by mouth 3 (three) times daily. 12/11/22   Myrlene Broker, MD  dicyclomine (BENTYL) 10 MG capsule Take 1 capsule (10 mg total) by mouth 3 (three) times daily before meals. 12/17/22   Letta Median, PA-C  fexofenadine (ALLEGRA) 180 MG tablet Take 1 tablet (180 mg total) by mouth daily as needed for allergies or rhinitis. 03/14/18   Raliegh Ip, DO   omeprazole (PRILOSEC) 40 MG capsule Take 1 capsule (40 mg total) by mouth daily. 10/22/22   Raliegh Ip, DO  pravastatin (PRAVACHOL) 20 MG tablet Take 1 tablet (20 mg total) by mouth daily. 12/21/19   Raliegh Ip, DO  prazosin (MINIPRESS) 2 MG capsule Take 1 capsule (2 mg total) by mouth at bedtime. 12/11/22   Myrlene Broker, MD  temazepam (RESTORIL) 30 MG capsule Take 1 capsule (30 mg total) by mouth at bedtime as needed for sleep. 12/11/22   Myrlene Broker, MD    Allergies as of 01/31/2023 - Review Complete 12/19/2022  Allergen Reaction Noted   Vicodin [hydrocodone-acetaminophen] Nausea Only 07/15/2013    Family History  Problem Relation Age of Onset   COPD Mother    Depression Mother    Heart disease Mother 48       Stents   Hyperlipidemia Mother    Hypertension Mother    Cancer Mother        lung cancer, to bone   Cancer Father        skin   Heart disease Father 72       Stents, pacemaker   Hypertension Father    Hyperlipidemia Father    Learning disabilities Father    Drug abuse Brother        overdose at 86   Drug abuse Daughter        drug overdose 32   Crohn's disease Maternal Aunt    Cancer Maternal Uncle        lung cancer   Crohn's disease Cousin    Colon cancer Neg Hx     Social History   Socioeconomic History   Marital status: Divorced    Spouse name: Not on file   Number of children: 2   Years of education: 13   Highest education level: Not on file  Occupational History   Occupation: disabled    Comment: neuropathy from chemo  Tobacco Use   Smoking status: Every Day    Current packs/day: 1.00    Average packs/day: 1 pack/day for 30.8 years (30.8 ttl pk-yrs)    Types: Cigarettes    Start date: 05/07/1992   Smokeless tobacco: Never   Tobacco comments:    discussed  Vaping Use   Vaping status: Never Used  Substance and Sexual Activity   Alcohol use: Not Currently   Drug use: Yes    Types: Marijuana   Sexual activity: Yes     Birth control/protection: Post-menopausal  Other Topics Concern   Not on file  Social History Narrative   Divorced.  Lives with boyfriend.   One child passed and one still living, lives in [place], reports sees regularly      Social Determinants of Health   Financial Resource Strain: Low Risk  (09/26/2022)   Overall Financial Resource Strain (CARDIA)    Difficulty of Paying Living Expenses: Not hard at all  Food Insecurity: No Food Insecurity (09/26/2022)   Hunger Vital Sign    Worried About Running Out of Food  in the Last Year: Never true    Ran Out of Food in the Last Year: Never true  Transportation Needs: No Transportation Needs (09/26/2022)   PRAPARE - Administrator, Civil Service (Medical): No    Lack of Transportation (Non-Medical): No  Physical Activity: Inactive (09/26/2022)   Exercise Vital Sign    Days of Exercise per Week: 0 days    Minutes of Exercise per Session: 0 min  Stress: No Stress Concern Present (09/26/2022)   [employer] of Occupational Health - Occupational Stress Questionnaire    Feeling of Stress : Not at all  Social Connections: Moderately Isolated (09/26/2022)   Social Connection and Isolation Panel [NHANES]    Frequency of Communication with Friends and Family: More than three times a week    Frequency of Social Gatherings with Friends and Family: Once a week    Attends Religious Services: Never    Database administrator or Organizations: No    Attends Banker Meetings: Never    Marital Status: Living with partner  Intimate Partner Violence: Not At Risk (09/26/2022)   Humiliation, Afraid, Rape, and Kick questionnaire    Fear of Current or Ex-Partner: No    Emotionally Abused: No    Physically Abused: No    Sexually Abused: No    Review of Systems: See HPI, otherwise negative ROS  Physical Exam: Vital signs in last 24 hours:     General:   Alert,  Well-developed, well-nourished, pleasant and cooperative in  NAD Head:  Normocephalic and atraumatic. Eyes:  Sclera clear, no icterus.   Conjunctiva pink. Ears:  Normal auditory acuity. Nose:  No deformity, discharge,  or lesions. Msk:  Symmetrical without gross deformities. Normal posture. Extremities:  Without clubbing or edema. Neurologic:  Alert and  oriented x4;  grossly normal neurologically. Skin:  Intact without significant lesions or rashes. Psych:  Alert and cooperative. Normal mood and affect.  Impression/Plan: [name] is here for a colonoscopy to be performed for surveillance purposes, personal history of adenomatous colon polyps in 2019  The risks of the procedure including infection, bleed, or perforation as well as benefits, limitations, alternatives and imponderables have been reviewed with the patient. Questions have been answered. All parties agreeable.

## 2023-03-04 NOTE — Transfer of Care (Signed)
Immediate Anesthesia Transfer of Care Note  Patient: [name]  Procedure(s) Performed: COLONOSCOPY WITH PROPOFOL BIOPSY  Patient Location: Short Stay  Anesthesia Type:General  Level of Consciousness: awake  Airway & Oxygen Therapy: Patient Spontanous Breathing  Post-op Assessment: Report given to RN and Post -op Vital signs reviewed and stable  Post vital signs: Reviewed and stable  Last Vitals:  Vitals Value Taken Time  BP    Temp    Pulse    Resp    SpO2      Last Pain:  Vitals:   03/04/23 1350  PainSc: 0-No pain         Complications: No notable events documented.

## 2023-03-04 NOTE — Anesthesia Preprocedure Evaluation (Signed)
Anesthesia Evaluation  Patient identified by MRN, date of birth, ID band Patient awake    Reviewed: Allergy & Precautions, H&P , NPO status , Patient's Chart, lab work & pertinent test results, reviewed documented beta blocker date and time   History of Anesthesia Complications (+) PONV and history of anesthetic complications  Airway Mallampati: II  TM Distance: >3 FB Neck ROM: full    Dental no notable dental hx.    Pulmonary neg pulmonary ROS, COPD, Current Smoker   Pulmonary exam normal breath sounds clear to auscultation       Cardiovascular Exercise Tolerance: Good negative cardio ROS  Rhythm:regular Rate:Normal     Neuro/Psych  Headaches PSYCHIATRIC DISORDERS Anxiety Depression     Neuromuscular disease negative neurological ROS  negative psych ROS   GI/Hepatic negative GI ROS, Neg liver ROS,GERD  ,,  Endo/Other  negative endocrine ROS    Renal/GU negative Renal ROS  negative genitourinary   Musculoskeletal   Abdominal   Peds  Hematology negative hematology ROS (+)   Anesthesia Other Findings   Reproductive/Obstetrics negative OB ROS                             Anesthesia Physical Anesthesia Plan  ASA: 3  Anesthesia Plan: General   Post-op Pain Management:    Induction:   PONV Risk Score and Plan: Propofol infusion  Airway Management Planned:   Additional Equipment:   Intra-op Plan:   Post-operative Plan:   Informed Consent: I have reviewed the patients History and Physical, chart, labs and discussed the procedure including the risks, benefits and alternatives for the proposed anesthesia with the patient or authorized representative who has indicated his/her understanding and acceptance.     Dental Advisory Given  Plan Discussed with: CRNA  Anesthesia Plan Comments:        Anesthesia Quick Evaluation

## 2023-03-04 NOTE — Op Note (Signed)
[facility] Patient Name: [name] Procedure Date: 03/04/2023 1:44 PM MRN: [id number] Date of Birth: [birth year] Attending MD: Hennie Duos. Marletta Lor , [place], [id number] CSN: [id number] Age: 62 Admit Type: Outpatient Procedure:                Colonoscopy Indications:              Surveillance: Personal history of adenomatous                            polyps on last colonoscopy 5 years ago Providers:                Hennie Duos. Marletta Lor, DO, Crystal Page, Francoise Ceo                            RN, RN, Barkley Bruns L. Jessee Avers, Technician Referring MD:              Medicines:                See the Anesthesia note for documentation of the                            administered medications Complications:            No immediate complications. Estimated Blood Loss:     Estimated blood loss was minimal. Procedure:                Pre-Anesthesia Assessment:                           - The anesthesia plan was to use monitored                            anesthesia care (MAC).                           After obtaining informed consent, the colonoscope                            was passed under direct vision. Throughout the                            procedure, the patient's blood pressure, pulse, and                            oxygen saturations were monitored continuously. The                            [id number]-HQ190L ([id number]) scope was introduced through                            the anus and advanced to the the terminal ileum,                            with identification of the appendiceal orifice and  IC valve. The colonoscopy was performed without                            difficulty. The patient tolerated the procedure                            well. The quality of the bowel preparation was                            evaluated using the BBPS [facility] Bowel Preparation                            Scale) with scores of: Right Colon = 3, Transverse                             Colon = 3 and Left Colon = 3 (entire mucosa seen                            well with no residual staining, small fragments of                            stool or opaque liquid). The total BBPS score                            equals 9. Scope In: 1:54:43 PM Scope Out: 2:06:11 PM Scope Withdrawal Time: 0 hours 8 minutes 19 seconds  Total Procedure Duration: 0 hours 11 minutes 28 seconds  Findings:      Non-bleeding internal hemorrhoids were found during endoscopy.      A few small-mouthed diverticula were found in the sigmoid colon and       descending colon.      The terminal ileum appeared normal.      Biopsies for histology were taken with a cold forceps from the ascending       colon, transverse colon and descending colon for evaluation of       microscopic colitis.      The exam was otherwise without abnormality. Impression:               - Non-bleeding internal hemorrhoids.                           - Diverticulosis in the sigmoid colon and in the                            descending colon.                           - The examined portion of the ileum was normal.                           - The examination was otherwise normal.                           - Biopsies were taken with a cold forceps from the  ascending colon, transverse colon and descending                            colon for evaluation of microscopic colitis. Moderate Sedation:      Per Anesthesia Care Recommendation:           - Patient has a contact number available for                            emergencies. The signs and symptoms of potential                            delayed complications were discussed with the                            patient. Return to normal activities tomorrow.                            Written discharge instructions were provided to the                            patient.                           - Resume previous diet.                           -  Continue present medications.                           - Await pathology results.                           - Repeat colonoscopy in 10 years for screening                            purposes.                           - Return to GI clinic in 3 months. Procedure Code(s):        --- Professional ---                           [phone], Colonoscopy, flexible; with biopsy, single                            or multiple Diagnosis Code(s):        --- Professional ---                           Z86.010, Personal history of colonic polyps                           K64.8, Other hemorrhoids                           K57.30, Diverticulosis of large intestine without  perforation or abscess without bleeding CPT copyright 2022 American Medical Association. All rights reserved. The codes documented in this report are preliminary and upon coder review may  be revised to meet current compliance requirements. Hennie Duos. Marletta Lor, DO Hennie Duos. Marletta Lor, DO 03/04/2023 2:10:56 PM This report has been signed electronically. Number of Addenda: 0

## 2023-03-04 NOTE — Anesthesia Procedure Notes (Signed)
Date/Time: 03/04/2023 1:51 PM  Performed by: Julian Reil, CRNAPre-anesthesia Checklist: Patient identified, Emergency Drugs available, Suction available and Patient being monitored Patient Re-evaluated:Patient Re-evaluated prior to induction Oxygen Delivery Method: Nasal cannula Induction Type: IV induction Placement Confirmation: positive ETCO2

## 2023-03-04 NOTE — Anesthesia Postprocedure Evaluation (Signed)
Anesthesia Post Note  Patient: [name]  Procedure(s) Performed: COLONOSCOPY WITH PROPOFOL BIOPSY  Patient location during evaluation: Phase II Anesthesia Type: General Level of consciousness: awake Pain management: pain level controlled Vital Signs Assessment: post-procedure vital signs reviewed and stable Respiratory status: spontaneous breathing and respiratory function stable Cardiovascular status: blood pressure returned to baseline and stable Postop Assessment: no headache and no apparent nausea or vomiting Anesthetic complications: no Comments: Late entry   No notable events documented.   Last Vitals:  Vitals:   03/04/23 1410 03/04/23 1417  BP: (!) 90/53 (!) 100/57  Pulse: 73 75  Resp: 18 16  Temp: (!) 36.3 C   SpO2: 98% 100%    Last Pain:  Vitals:   03/04/23 1417  TempSrc:   PainSc: 0-No pain                 Windell Norfolk

## 2023-03-05 LAB — SURGICAL PATHOLOGY

## 2023-03-08 ENCOUNTER — Encounter (HOSPITAL_COMMUNITY): Payer: Self-pay | Admitting: Internal Medicine

## 2023-03-13 ENCOUNTER — Encounter (HOSPITAL_COMMUNITY): Payer: Self-pay | Admitting: Psychiatry

## 2023-03-13 ENCOUNTER — Telehealth (HOSPITAL_COMMUNITY): Payer: Medicare HMO | Admitting: Psychiatry

## 2023-03-13 DIAGNOSIS — F332 Major depressive disorder, recurrent severe without psychotic features: Secondary | ICD-10-CM | POA: Diagnosis not present

## 2023-03-13 DIAGNOSIS — F411 Generalized anxiety disorder: Secondary | ICD-10-CM

## 2023-03-13 DIAGNOSIS — F4310 Post-traumatic stress disorder, unspecified: Secondary | ICD-10-CM | POA: Diagnosis not present

## 2023-03-13 MED ORDER — TEMAZEPAM 30 MG PO CAPS: 30.0000 mg | ORAL_CAPSULE | Freq: Every evening | ORAL | 2 refills | Status: DC | PRN

## 2023-03-13 MED ORDER — DIAZEPAM 10 MG PO TABS: 10.0000 mg | ORAL_TABLET | Freq: Three times a day (TID) | ORAL | 2 refills | Status: DC

## 2023-03-13 MED ORDER — CITALOPRAM HYDROBROMIDE 20 MG PO TABS: 20.0000 mg | ORAL_TABLET | Freq: Every day | ORAL | 2 refills | Status: DC

## 2023-03-13 MED ORDER — PRAZOSIN HCL 2 MG PO CAPS: 2.0000 mg | ORAL_CAPSULE | Freq: Every day | ORAL | 2 refills | Status: DC

## 2023-03-13 NOTE — Progress Notes (Signed)
Virtual Visit via Telephone Note  I connected with [name] on 03/13/23 at  1:00 PM EST by telephone and verified that I am speaking with the correct person using two identifiers.  Location: Patient: home Provider: office   I discussed the limitations, risks, security and privacy concerns of performing an evaluation and management service by telephone and the availability of in person appointments. I also discussed with the patient that there may be a patient responsible charge related to this service. The patient expressed understanding and agreed to proceed.      I discussed the assessment and treatment plan with the patient. The patient was provided an opportunity to ask questions and all were answered. The patient agreed with the plan and demonstrated an understanding of the instructions.   The patient was advised to call back or seek an in-person evaluation if the symptoms worsen or if the condition fails to improve as anticipated.  I provided 15 minutes of non-face-to-face time during this encounter.   Diannia Ruder, MD  [facility] MD/PA/NP OP Progress Note  03/13/2023 1:22 PM [name]  MRN:  [id number]  Chief Complaint:  Chief Complaint  Patient presents with   Depression   Anxiety   Follow-up   HPI: This patient is a 62-year-old divorced white female who lives with a boyfriend and [place].  She has 1 grown son and 1 grandson.  She had a daughter who died in 03-27-12 of a narcotic overdose.  She is on disability.  The patient returns for follow-up after 3 months regarding her depression and anxiety.  She states that she has been more stressed lately.  Her father just had a second stroke.  Her son has a new girlfriend and she does not think the girlfriend likes or approves of her.  Consequently she is having less time with her grandson.  All of this has been getting her down to some degree but she is getting along better with her boyfriend.  She denies any thoughts of  suicide.  She is just sleeping well. Visit Diagnosis:    ICD-10-CM   1. Major depressive disorder, recurrent, severe without psychotic features (HCC)  F33.2     2. Generalized anxiety disorder  F41.1     3. PTSD (post-traumatic stress disorder)  F43.10       Past Psychiatric History: Past outpatient treatment  Past Medical History:  Past Medical History:  Diagnosis Date   Allergy    Anxiety    Blood transfusion without reported diagnosis    Breast cancer (HCC)    Breast cancer (HCC) 04/21/2012   Stage II (T1c N1 M0) grade 3 triple negative breast cancer, right- sided with 1 of 5 positive nodes, status post FEC in a dose dense fashion for 6 cycles followed by radiation therapy by Dr. Roselind Messier for her high-risk disease with her initial date of surgery in February 2008.    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   COPD (chronic obstructive pulmonary disease) (HCC)    Depression    Emphysema of lung (HCC)    GERD (gastroesophageal reflux disease) 12/18/2012   Headache    Hypercholesteremia    Hyperlipidemia    Neuropathy    Osteoporosis    PONV (postoperative nausea and vomiting)    PTSD (post-traumatic stress disorder)    Substance abuse [facility])     Past Surgical History:  Procedure Laterality Date   BIOPSY  11/05/2017   Procedure: BIOPSY;  Surgeon: West Bali, MD;  [facility]  [facility];  Service: Endoscopy;;  colon duodenum gastric   BIOPSY  03/04/2023   Procedure: BIOPSY;  Surgeon: Lanelle Bal, DO;  Location: [facility];  Service: Endoscopy;;   BREAST SURGERY     CATARACT EXTRACTION W/PHACO Left 08/15/2017   Procedure: CATARACT EXTRACTION WITH PHACOEMULSIFICATION  AND INTRAOCULAR LENS PLACEMENT LEFT EYE CDE=2.68;  Surgeon: Fabio Pierce, MD;  Location: [facility] ORS;  Service: Ophthalmology;  Laterality: Left;  left   CATARACT EXTRACTION W/PHACO Right 09/06/2017   Procedure: CATARACT EXTRACTION PHACO AND INTRAOCULAR LENS PLACEMENT (IOC);  Surgeon: Fabio Pierce, MD;  Location: [facility] ORS;  Service: Ophthalmology;  Laterality: Right;  CDE: 1.61   COLONOSCOPY WITH PROPOFOL N/A 11/05/2017    6 mm polyp in the sigmoid colon which was sessile and removed.  Rectosigmoid colon and sigmoid colon biopsies taken for evaluation for microscopic colitis.  Intermittent rectal bleeding due to sigmoid colon polyp and internal hemorrhoids. Normal colonic biopsies. Tubular adenoma. Repeat surveillance 5-10 years.    COLONOSCOPY WITH PROPOFOL N/A 03/04/2023   Procedure: COLONOSCOPY WITH PROPOFOL;  Surgeon: Lanelle Bal, DO;  [facility];  Service: Endoscopy;  Laterality: N/A;  2:15 pm, asa 3   ESOPHAGOGASTRODUODENOSCOPY  03/10/2008   SLF: Normal esophagus without evidence of Barrett, mass, erosion  ulceration, or stricture, small bowel bx negative, gastritis with NO h.pylori   ESOPHAGOGASTRODUODENOSCOPY (EGD) WITH PROPOFOL N/A 11/05/2017   Mild gastritis and duodenitis.    leocolonoscopy  03/10/2008   QMV:HQIONG terminal ileum, approximately 10 cm visualized/Normal colon without evidence of polyps, mass, inflammatory changes, diverticula, or AVMs/Normal retroflexed view of the rectum. random colon bx negative.   MASTECTOMY     bilateral, status post reconstruction.   POLYPECTOMY  11/05/2017   Procedure: POLYPECTOMY;  Surgeon: West Bali, MD;  [facility];  Service: Endoscopy;;  colon   TUBAL LIGATION      Family Psychiatric History: See below  Family History:  Family History  Problem Relation Age of Onset   COPD Mother    Depression Mother    Heart disease Mother 88       Stents   Hyperlipidemia Mother    Hypertension Mother    Cancer Mother        lung cancer, to bone   Cancer Father        skin   Heart disease Father 80       Stents, pacemaker   Hypertension Father    Hyperlipidemia Father    Learning disabilities Father    Drug abuse Brother        overdose at 77   Drug abuse Daughter        drug overdose 32   Crohn's  disease Maternal Aunt    Cancer Maternal Uncle        lung cancer   Crohn's disease Cousin    Colon cancer Neg Hx     Social History:  Social History   Socioeconomic History   Marital status: Divorced    Spouse name: Not on file   Number of children: 2   Years of education: 13   Highest education level: Not on file  Occupational History   Occupation: disabled    Comment: neuropathy from chemo  Tobacco Use   Smoking status: Every Day    Current packs/day: 1.00    Average packs/day: 1 pack/day for 30.8 years (30.8 ttl pk-yrs)    Types: Cigarettes    Start date: 05/07/1992  Smokeless tobacco: Never   Tobacco comments:    discussed  Vaping Use   Vaping status: Never Used  Substance and Sexual Activity   Alcohol use: Not Currently   Drug use: Yes    Types: Marijuana   Sexual activity: Yes    Birth control/protection: Post-menopausal  Other Topics Concern   Not on file  Social History Narrative   Divorced.  Lives with boyfriend.   One child passed and one still living, lives in [place], reports sees regularly      Social Determinants of Health   Financial Resource Strain: Low Risk  (09/26/2022)   Overall Financial Resource Strain (CARDIA)    Difficulty of Paying Living Expenses: Not hard at all  Food Insecurity: No Food Insecurity (09/26/2022)   Hunger Vital Sign    Worried About Running Out of Food in the Last Year: Never true    Ran Out of Food in the Last Year: Never true  Transportation Needs: No Transportation Needs (09/26/2022)   PRAPARE - Administrator, Civil Service (Medical): No    Lack of Transportation (Non-Medical): No  Physical Activity: Inactive (09/26/2022)   Exercise Vital Sign    Days of Exercise per Week: 0 days    Minutes of Exercise per Session: 0 min  Stress: No Stress Concern Present (09/26/2022)   [employer] of Occupational Health - Occupational Stress Questionnaire    Feeling of Stress : Not at all  Social Connections:  Moderately Isolated (09/26/2022)   Social Connection and Isolation Panel [NHANES]    Frequency of Communication with Friends and Family: More than three times a week    Frequency of Social Gatherings with Friends and Family: Once a week    Attends Religious Services: Never    Database administrator or Organizations: No    Attends Banker Meetings: Never    Marital Status: Living with partner    Allergies:  Allergies  Allergen Reactions   Vicodin [Hydrocodone-Acetaminophen] Nausea Only    Metabolic Disorder Labs: Lab Results  Component Value Date   HGBA1C 5.9 (H) 09/21/2022   No results found for: "PROLACTIN" Lab Results  Component Value Date   CHOL 172 12/18/2019   TRIG 110 12/18/2019   HDL 53 12/18/2019   CHOLHDL 3.2 12/18/2019   VLDL 42 (H) 11/23/2016   LDLCALC 99 12/18/2019   LDLCALC 103 (H) 07/08/2017   Lab Results  Component Value Date   TSH 2.480 09/21/2022   TSH 3.110 01/17/2021    Therapeutic Level Labs: No results found for: "LITHIUM" No results found for: "VALPROATE" No results found for: "CBMZ"  Current Medications: Current Outpatient Medications  Medication Sig Dispense Refill   albuterol (VENTOLIN HFA) 108 (90 Base) MCG/ACT inhaler INHALE 2 PUFFS BY MOUTH EVERY 6 HOURS AS NEEDED FOR WHEEZING FOR SHORTNESS OF BREATH 9 g 2   cholecalciferol (VITAMIN D) 1000 units tablet Take 1,000 Units by mouth daily.     citalopram (CELEXA) 20 MG tablet Take 1 tablet (20 mg total) by mouth daily. 30 tablet 2   diazepam (VALIUM) 10 MG tablet Take 1 tablet (10 mg total) by mouth 3 (three) times daily. 90 tablet 2   dicyclomine (BENTYL) 10 MG capsule Take 1 capsule (10 mg total) by mouth 3 (three) times daily before meals. 90 capsule 3   fexofenadine (ALLEGRA) 180 MG tablet Take 1 tablet (180 mg total) by mouth daily as needed for allergies or rhinitis. 90 tablet 3   omeprazole (  PRILOSEC) 40 MG capsule Take 1 capsule (40 mg total) by mouth daily. 90 capsule 0    pravastatin (PRAVACHOL) 20 MG tablet Take 1 tablet (20 mg total) by mouth daily. 90 tablet 3   prazosin (MINIPRESS) 2 MG capsule Take 1 capsule (2 mg total) by mouth at bedtime. 30 capsule 2   temazepam (RESTORIL) 30 MG capsule Take 1 capsule (30 mg total) by mouth at bedtime as needed for sleep. 30 capsule 2   No current facility-administered medications for this visit.     Musculoskeletal: Strength & Muscle Tone: na Gait & Station: na Patient leans: N/A  Psychiatric Specialty Exam: Review of Systems  Psychiatric/Behavioral:  The patient is nervous/anxious.   All other systems reviewed and are negative.   Last menstrual period 07/09/2013.There is no height or weight on file to calculate BMI.  General Appearance: na  Eye Contact:  na  Speech: Normal  Volume:  Normal  Mood:  Anxious  Affect:  NA  Thought Process:  Goal Directed  Orientation:  Full (Time, Place, and Person)  Thought Content: NA and Rumination   Suicidal Thoughts:  No  Homicidal Thoughts:  No  Memory:  Immediate;   Good Recent;   Good Remote;   Fair  Judgement:  Good  Insight:  Fair  Psychomotor Activity:  Decreased  Concentration:  Concentration: Good and Attention Span: Good  Recall:  Good  Fund of Knowledge: Good  Language: Good  Akathisia:  No  Handed:  Right  AIMS (if indicated): not done  Assets:  Communication Skills Desire for Improvement Resilience Social Support  ADL's:  Intact  Cognition: WNL  Sleep:  Good   Screenings: AUDIT    Flowsheet Row Clinical Support from 04/26/2021 in [facility]  Alcohol Use Disorder Identification Test Final Score (AUDIT) 4      GAD-7    Flowsheet Row Office Visit from 09/21/2022 in [facility] Office Visit from 01/10/2022 in [facility] Office Visit from 05/31/2021 in [facility] Office Visit from 01/17/2021 in [facility] Counselor from 09/27/2020 in [facility] [place]  Total GAD-7 Score 16 19 19 17 19       PHQ2-9    Flowsheet Row Clinical Support from 09/26/2022 in [facility] Office Visit from 09/21/2022 in [facility] Office Visit from 01/10/2022 in [facility] Video Visit from 12/06/2021 in [facility] [place] Video Visit from 09/05/2021 in [facility] Total Score 4 6 0 0 0  PHQ-9 Total Score 11 12 15  -- --      Flowsheet Row Pre-Admission Testing 62 from 02/28/2023 in [facility] Video Visit from 12/06/2021 in [facility] [place] Video Visit from 09/05/2021 in [facility] [place]  C-SSRS RISK CATEGORY No Risk No Risk No Risk        Assessment and Plan: This patient is a 62 year old female with a history of depression anxiety and PTSD.  For most of the time she is doing well on her current regimen despite recent stressors.  She will continue Celexa 20 mg daily for depression, Valium 10 mg 3 times daily for anxiety, Restoril 30 mg at bedtime for sleep and prazosin 2 mg at bedtime  to prevent nightmares.  She will return to see me in 3 months  Collaboration of Care: Collaboration of Care: Primary Care Provider AEB notes are shared with PCP through the epic system  Patient/Guardian was advised Release of Information must be obtained prior to any record release in order to collaborate their care with an outside provider. Patient/Guardian was advised if they have not already done so to contact the registration department to sign all necessary forms in order for [place] to release information regarding their care.   Consent: Patient/Guardian gives verbal consent for treatment  and assignment of benefits for services provided during this visit. Patient/Guardian expressed understanding and agreed to proceed.    Diannia Ruder, MD 03/13/2023, 1:22 PM

## 2023-03-19 ENCOUNTER — Other Ambulatory Visit: Payer: Self-pay | Admitting: Family Medicine

## 2023-03-19 DIAGNOSIS — J410 Simple chronic bronchitis: Secondary | ICD-10-CM

## 2023-03-19 DIAGNOSIS — R053 Chronic cough: Secondary | ICD-10-CM

## 2023-04-03 ENCOUNTER — Telehealth: Payer: Self-pay | Admitting: Family Medicine

## 2023-04-03 NOTE — Telephone Encounter (Signed)
Patient is calling in needing a appt to have her implants looked at and a medication refill she needs to be seen sooner then what is available

## 2023-04-18 ENCOUNTER — Encounter: Payer: Self-pay | Admitting: Gastroenterology

## 2023-05-15 DIAGNOSIS — E785 Hyperlipidemia, unspecified: Secondary | ICD-10-CM | POA: Diagnosis not present

## 2023-05-15 DIAGNOSIS — F17210 Nicotine dependence, cigarettes, uncomplicated: Secondary | ICD-10-CM | POA: Diagnosis not present

## 2023-05-15 DIAGNOSIS — F321 Major depressive disorder, single episode, moderate: Secondary | ICD-10-CM | POA: Diagnosis not present

## 2023-05-15 DIAGNOSIS — Z008 Encounter for other general examination: Secondary | ICD-10-CM | POA: Diagnosis not present

## 2023-05-15 DIAGNOSIS — Z6825 Body mass index (BMI) 25.0-25.9, adult: Secondary | ICD-10-CM | POA: Diagnosis not present

## 2023-05-15 DIAGNOSIS — E663 Overweight: Secondary | ICD-10-CM | POA: Diagnosis not present

## 2023-05-15 DIAGNOSIS — Z9013 Acquired absence of bilateral breasts and nipples: Secondary | ICD-10-CM | POA: Diagnosis not present

## 2023-05-15 DIAGNOSIS — R7303 Prediabetes: Secondary | ICD-10-CM | POA: Diagnosis not present

## 2023-05-15 DIAGNOSIS — J449 Chronic obstructive pulmonary disease, unspecified: Secondary | ICD-10-CM | POA: Diagnosis not present

## 2023-06-03 ENCOUNTER — Other Ambulatory Visit: Payer: Self-pay | Admitting: Family Medicine

## 2023-06-03 ENCOUNTER — Encounter: Payer: Self-pay | Admitting: Family Medicine

## 2023-06-03 ENCOUNTER — Ambulatory Visit (INDEPENDENT_AMBULATORY_CARE_PROVIDER_SITE_OTHER): Payer: No Typology Code available for payment source | Admitting: Family Medicine

## 2023-06-03 VITALS — BP 108/68 | HR 83 | Temp 98.5°F | Ht 64.0 in | Wt 153.8 lb

## 2023-06-03 DIAGNOSIS — N650 Deformity of reconstructed breast: Secondary | ICD-10-CM

## 2023-06-03 DIAGNOSIS — Z720 Tobacco use: Secondary | ICD-10-CM

## 2023-06-03 DIAGNOSIS — E785 Hyperlipidemia, unspecified: Secondary | ICD-10-CM | POA: Diagnosis not present

## 2023-06-03 DIAGNOSIS — Z23 Encounter for immunization: Secondary | ICD-10-CM | POA: Diagnosis not present

## 2023-06-03 DIAGNOSIS — R7303 Prediabetes: Secondary | ICD-10-CM | POA: Diagnosis not present

## 2023-06-03 DIAGNOSIS — K219 Gastro-esophageal reflux disease without esophagitis: Secondary | ICD-10-CM

## 2023-06-03 DIAGNOSIS — T8544XA Capsular contracture of breast implant, initial encounter: Secondary | ICD-10-CM

## 2023-06-03 LAB — BAYER DCA HB A1C WAIVED: HB A1C (BAYER DCA - WAIVED): 5.5 % (ref 4.8–5.6)

## 2023-06-03 NOTE — Progress Notes (Signed)
Subjective: CC: Breast abnormality PCP: Raliegh Ip, DO [id number] I Dragos is a 63 y.o. female presenting to clinic today for:  1.  Breast abnormality Patient with history of silicone breast implants.  She had these last worked on back in 2018 with Dr. Earle Gell at [facility].  He has since retired.  About 3 to 4 weeks ago she noticed a puckering of the breast on the right upper quadrant and this concerned her for complication as this has happened in the past and she has had multiple reconstructive surgeries on the breasts.  She does not necessarily have any significant pain but she notes that the last time this happened they emergently brought her into breast surgery  Also requests lung cancer screening and repeat A1c while she is here  ROS: Per HPI  Allergies  Allergen Reactions   Vicodin [Hydrocodone-Acetaminophen] Nausea Only   Past Medical History:  Diagnosis Date   Allergy    Anxiety    Blood transfusion without reported diagnosis    Breast cancer (HCC)    Breast cancer (HCC) 04/21/2012   Stage II (T1c N1 M0) grade 3 triple negative breast cancer, right- sided with 1 of 5 positive nodes, status post FEC in a dose dense fashion for 6 cycles followed by radiation therapy by Dr. Roselind Messier for her high-risk disease with her initial date of surgery in February 2008.    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   COPD (chronic obstructive pulmonary disease) (HCC)    Depression    Emphysema of lung (HCC)    GERD (gastroesophageal reflux disease) 12/18/2012   Headache    Hypercholesteremia    Hyperlipidemia    Neuropathy    Osteoporosis    PONV (postoperative nausea and vomiting)    PTSD (post-traumatic stress disorder)    Substance abuse (HCC)     Current Outpatient Medications:    albuterol (VENTOLIN HFA) 108 (90 Base) MCG/ACT inhaler, INHALE 2 PUFFS BY MOUTH EVERY 6 HOURS AS NEEDED FOR WHEEZING FOR SHORTNESS OF BREATH, Disp: 9 g, Rfl: 2    cholecalciferol (VITAMIN D) 1000 units tablet, Take 1,000 Units by mouth daily., Disp: , Rfl:    citalopram (CELEXA) 20 MG tablet, Take 1 tablet (20 mg total) by mouth daily., Disp: 30 tablet, Rfl: 2   diazepam (VALIUM) 10 MG tablet, Take 1 tablet (10 mg total) by mouth 3 (three) times daily., Disp: 90 tablet, Rfl: 2   dicyclomine (BENTYL) 10 MG capsule, Take 1 capsule (10 mg total) by mouth 3 (three) times daily before meals., Disp: 90 capsule, Rfl: 3   fexofenadine (ALLEGRA) 180 MG tablet, Take 1 tablet (180 mg total) by mouth daily as needed for allergies or rhinitis., Disp: 90 tablet, Rfl: 3   omeprazole (PRILOSEC) 40 MG capsule, Take 1 capsule (40 mg total) by mouth daily., Disp: 90 capsule, Rfl: 0   pravastatin (PRAVACHOL) 20 MG tablet, Take 1 tablet (20 mg total) by mouth daily., Disp: 90 tablet, Rfl: 3   prazosin (MINIPRESS) 2 MG capsule, Take 1 capsule (2 mg total) by mouth at bedtime., Disp: 30 capsule, Rfl: 2   temazepam (RESTORIL) 30 MG capsule, Take 1 capsule (30 mg total) by mouth at bedtime as needed for sleep., Disp: 30 capsule, Rfl: 2 Social History   Socioeconomic History   Marital status: Divorced    Spouse name: Not on file   Number of children: 2   Years of education: 13   Highest education level: Not on file  Occupational History   Occupation: disabled    Comment: neuropathy from chemo  Tobacco Use   Smoking status: Every Day    Current packs/day: 1.00    Average packs/day: 1 pack/day for 31.1 years (31.1 ttl pk-yrs)    Types: Cigarettes    Start date: 05/07/1992   Smokeless tobacco: Never   Tobacco comments:    discussed  Vaping Use   Vaping status: Never Used  Substance and Sexual Activity   Alcohol use: Not Currently   Drug use: Yes    Types: Marijuana   Sexual activity: Yes    Birth control/protection: Post-menopausal  Other Topics Concern   Not on file  Social History Narrative   Divorced.  Lives with boyfriend.   One child passed and one still  living, lives in [place], reports sees regularly      Social Drivers of Health   Financial Resource Strain: Low Risk  (09/26/2022)   Overall Financial Resource Strain (CARDIA)    Difficulty of Paying Living Expenses: Not hard at all  Food Insecurity: No Food Insecurity (09/26/2022)   Hunger Vital Sign    Worried About Running Out of Food in the Last Year: Never true    Ran Out of Food in the Last Year: Never true  Transportation Needs: No Transportation Needs (09/26/2022)   PRAPARE - Administrator, Civil Service (Medical): No    Lack of Transportation (Non-Medical): No  Physical Activity: Inactive (09/26/2022)   Exercise Vital Sign    Days of Exercise per Week: 0 days    Minutes of Exercise per Session: 0 min  Stress: No Stress Concern Present (09/26/2022)   [employer] of Occupational Health - Occupational Stress Questionnaire    Feeling of Stress : Not at all  Social Connections: Moderately Isolated (09/26/2022)   Social Connection and Isolation Panel [NHANES]    Frequency of Communication with Friends and Family: More than three times a week    Frequency of Social Gatherings with Friends and Family: Once a week    Attends Religious Services: Never    Database administrator or Organizations: No    Attends Banker Meetings: Never    Marital Status: Living with partner  Intimate Partner Violence: Not At Risk (09/26/2022)   Humiliation, Afraid, Rape, and Kick questionnaire    Fear of Current or Ex-Partner: No    Emotionally Abused: No    Physically Abused: No    Sexually Abused: No   Family History  Problem Relation Age of Onset   COPD Mother    Depression Mother    Heart disease Mother 48       Stents   Hyperlipidemia Mother    Hypertension Mother    Cancer Mother        lung cancer, to bone   Cancer Father        skin   Heart disease Father 44       Stents, pacemaker   Hypertension Father    Hyperlipidemia Father    Learning  disabilities Father    Drug abuse Brother        overdose at 58   Drug abuse Daughter        drug overdose 32   Crohn's disease Maternal Aunt    Cancer Maternal Uncle        lung cancer   Crohn's disease Cousin    Colon cancer Neg Hx     Objective: Office vital signs reviewed. BP  108/68   Pulse 83   Temp 98.5 F (36.9 C)   Ht 5\' 4"  (1.626 m)   Wt 153 lb 12.8 oz (69.8 kg)   LMP 07/09/2013   SpO2 95%   BMI 26.40 kg/m   Physical Examination:  General: Awake, alert, well nourished, No acute distress Breast: Right upper outer quadrant of the breast with a curvilinear palpable deformity in the breast implant.  There are no superficial skin changes including peau d' orange  Assessment/ Plan: 63 y.o. female   Deformity of reconstructed breast - Plan: MR BREAST BILATERAL W WO CONTRAST INC CAD, Ambulatory referral to Plastic Surgery, CANCELED: MR BREAST BILATERAL W WO CONTRAST INC CAD  Capsular contracture of breast implant, initial encounter - Plan: MR BREAST BILATERAL W WO CONTRAST INC CAD, Ambulatory referral to Plastic Surgery, CANCELED: MR BREAST BILATERAL W WO CONTRAST INC CAD  Tobacco abuse - Plan: Ambulatory Referral Lung Cancer Screening [facility]  Prediabetes - Plan: Bayer DCA Hb A1c Waived  Encounter for immunization - Plan: Flu vaccine trivalent PF, 6mos and older(Flulaval,Afluria,Fluarix,Fluzone)  I have placed stat MRI of the breast as well as stat referral to breast surgery.  Her plastic surgeon unfortunately has retired but hopefully we can get her into one of his colleagues at [facility].  I was able to palpate the area of concern in the right upper outer quadrant of the breast.  I have also ordered lung cancer screening  A1c given history of prediabetes and influenza vaccination administered.  She will schedule physical with Pap smear   Raliegh Ip, DO [facility] [phone]

## 2023-06-04 ENCOUNTER — Other Ambulatory Visit: Payer: Self-pay

## 2023-06-04 DIAGNOSIS — K219 Gastro-esophageal reflux disease without esophagitis: Secondary | ICD-10-CM

## 2023-06-04 LAB — LIPID PANEL
Chol/HDL Ratio: 4 {ratio} (ref 0.0–4.4)
Cholesterol, Total: 226 mg/dL — ABNORMAL HIGH (ref 100–199)
HDL: 57 mg/dL (ref >39)
LDL Chol Calc (NIH): 150 mg/dL — ABNORMAL HIGH (ref 0–99)
Triglycerides: 106 mg/dL (ref 0–149)
VLDL Cholesterol Cal: 19 mg/dL (ref 5–40)

## 2023-06-04 MED ORDER — OMEPRAZOLE 40 MG PO CPDR: 40.0000 mg | DELAYED_RELEASE_CAPSULE | Freq: Every day | ORAL | 0 refills | Status: DC

## 2023-06-05 ENCOUNTER — Ambulatory Visit (HOSPITAL_COMMUNITY)
Admission: RE | Admit: 2023-06-05 | Discharge: 2023-06-05 | Disposition: A | Discharge status: Home / Self Care | Payer: No Typology Code available for payment source | Source: Ambulatory Visit | Attending: Family Medicine | Admitting: Family Medicine

## 2023-06-05 DIAGNOSIS — N650 Deformity of reconstructed breast: Secondary | ICD-10-CM | POA: Diagnosis not present

## 2023-06-05 DIAGNOSIS — T8544XA Capsular contracture of breast implant, initial encounter: Secondary | ICD-10-CM | POA: Insufficient documentation

## 2023-06-05 DIAGNOSIS — N644 Mastodynia: Secondary | ICD-10-CM | POA: Diagnosis not present

## 2023-06-05 DIAGNOSIS — N6092 Unspecified benign mammary dysplasia of left breast: Secondary | ICD-10-CM | POA: Diagnosis not present

## 2023-06-05 DIAGNOSIS — D0511 Intraductal carcinoma in situ of right breast: Secondary | ICD-10-CM | POA: Diagnosis not present

## 2023-06-05 LAB — LIPOPROTEIN A (LPA): Lipoprotein (a): 36.8 nmol/L (ref <75.0)

## 2023-06-05 MED ORDER — GADOBUTROL 1 MMOL/ML IV SOLN
7.0000 mL | Freq: Once | INTRAVENOUS | Status: AC | PRN
  Administered 2023-06-05: 7 mL via INTRAVENOUS

## 2023-06-07 ENCOUNTER — Other Ambulatory Visit: Payer: Self-pay | Admitting: Family Medicine

## 2023-06-07 ENCOUNTER — Encounter: Payer: Self-pay | Admitting: Family Medicine

## 2023-06-07 DIAGNOSIS — N650 Deformity of reconstructed breast: Secondary | ICD-10-CM

## 2023-06-07 NOTE — Addendum Note (Signed)
Addended by: Raliegh Ip on: 06/07/2023 08:56 AM   Modules accepted: Orders

## 2023-06-07 NOTE — Progress Notes (Signed)
Orders Placed This Encounter  Procedures   US BREAST COMPLETE UNI RIGHT INC AXILLA    Standing Status:   Future    Expiration Date:   06/06/2024    Reason for Exam (SYMPTOM  OR DIAGNOSIS REQUIRED):   palpable abnormality in R upper outer quadrant    Preferred imaging location?:   [facility]

## 2023-06-12 ENCOUNTER — Telehealth: Payer: Self-pay

## 2023-06-12 NOTE — Telephone Encounter (Signed)
 Copied from CRM [id number]. Topic: Clinical - Lab/Test Results >> Jun 12, 2023  2:42 PM Deleta HERO wrote: Reason for CRM: PT returning missed call regarding her MRI results, relayed the results verbatim left by her PCP on 01/31. The patient does have a question. PT states she spoke with the [facility] to schedule her referral, they advised her of needing a biopsy completed and she is unsure why a biopsy would be needed if nothing was found on the MRI? Callback [phone]

## 2023-06-12 NOTE — Telephone Encounter (Signed)
 Patient informed. LS

## 2023-06-13 ENCOUNTER — Telehealth (HOSPITAL_COMMUNITY): Payer: No Typology Code available for payment source | Admitting: Psychiatry

## 2023-06-20 ENCOUNTER — Ambulatory Visit
Admission: RE | Admit: 2023-06-20 | Discharge: 2023-06-20 | Disposition: A | Discharge status: Home / Self Care | Payer: No Typology Code available for payment source | Source: Ambulatory Visit | Attending: Family Medicine | Admitting: Family Medicine

## 2023-06-20 ENCOUNTER — Other Ambulatory Visit (HOSPITAL_COMMUNITY): Payer: Self-pay | Admitting: Psychiatry

## 2023-06-20 ENCOUNTER — Other Ambulatory Visit: Payer: Self-pay | Admitting: Gastroenterology

## 2023-06-20 DIAGNOSIS — N650 Deformity of reconstructed breast: Secondary | ICD-10-CM

## 2023-06-20 DIAGNOSIS — N6311 Unspecified lump in the right breast, upper outer quadrant: Secondary | ICD-10-CM | POA: Diagnosis not present

## 2023-06-20 DIAGNOSIS — R109 Unspecified abdominal pain: Secondary | ICD-10-CM

## 2023-06-20 DIAGNOSIS — R197 Diarrhea, unspecified: Secondary | ICD-10-CM

## 2023-06-20 NOTE — Telephone Encounter (Signed)
Call for appt

## 2023-06-21 ENCOUNTER — Telehealth (HOSPITAL_COMMUNITY): Payer: No Typology Code available for payment source | Admitting: Psychiatry

## 2023-06-21 ENCOUNTER — Encounter (HOSPITAL_COMMUNITY): Payer: Self-pay | Admitting: Psychiatry

## 2023-06-21 DIAGNOSIS — F4310 Post-traumatic stress disorder, unspecified: Secondary | ICD-10-CM

## 2023-06-21 DIAGNOSIS — F332 Major depressive disorder, recurrent severe without psychotic features: Secondary | ICD-10-CM | POA: Diagnosis not present

## 2023-06-21 DIAGNOSIS — F411 Generalized anxiety disorder: Secondary | ICD-10-CM | POA: Diagnosis not present

## 2023-06-21 MED ORDER — PRAZOSIN HCL 2 MG PO CAPS: 2.0000 mg | ORAL_CAPSULE | Freq: Every day | ORAL | 0 refills | Status: DC

## 2023-06-21 MED ORDER — CITALOPRAM HYDROBROMIDE 20 MG PO TABS: 20.0000 mg | ORAL_TABLET | Freq: Every day | ORAL | 2 refills | Status: DC

## 2023-06-21 MED ORDER — TEMAZEPAM 30 MG PO CAPS: 30.0000 mg | ORAL_CAPSULE | Freq: Every day | ORAL | 0 refills | Status: DC

## 2023-06-21 MED ORDER — DIAZEPAM 10 MG PO TABS: 10.0000 mg | ORAL_TABLET | Freq: Three times a day (TID) | ORAL | 2 refills | Status: DC

## 2023-06-21 NOTE — Progress Notes (Signed)
Virtual Visit via Telephone Note  I connected with [name] on 06/21/23 at 10:40 AM EST by telephone and verified that I am speaking with the correct person using two identifiers.  Location: Patient: home Provider: office   I discussed the limitations, risks, security and privacy concerns of performing an evaluation and management service by telephone and the availability of in person appointments. I also discussed with the patient that there may be a patient responsible charge related to this service. The patient expressed understanding and agreed to proceed.      I discussed the assessment and treatment plan with the patient. The patient was provided an opportunity to ask questions and all were answered. The patient agreed with the plan and demonstrated an understanding of the instructions.   The patient was advised to call back or seek an in-person evaluation if the symptoms worsen or if the condition fails to improve as anticipated.  I provided 20 minutes of non-face-to-face time during this encounter.   Diannia Ruder, MD  [facility] MD/PA/NP OP Progress Note  06/21/2023 11:04 AM [name]  MRN:  [id number]  Chief Complaint:  Chief Complaint  Patient presents with   Anxiety   Depression   Follow-up   HPI: This patient is a 63 year old divorced white female who lives with a boyfriend and [place]. She has 1 grown son and 1 grandson. She had a daughter who died in 07/16/2011 of a narcotic overdose. She is on disability.   The patient returns for follow-up after 3 months regarding her depression and anxiety.  She states that her dad passed away about a week ago.  He had been sick for a long time and had numerous strokes.  The patient and her sister had been taking care of him.  She actually feels gratified that he is in a better place.  For the most part the patient is doing well in terms of mood and anxiety.  She has stepped back from trying to interfere with her son's life.  She  still gets to see her grandson a good deal and she "lives for that."  Most the time she is sleeping well and her mood has been stable.  She denies thoughts of self-harm or suicide Visit Diagnosis:    ICD-10-CM   1. Major depressive disorder, recurrent, severe without psychotic features (HCC)  F33.2     2. Generalized anxiety disorder  F41.1     3. PTSD (post-traumatic stress disorder)  F43.10       Past Psychiatric History: Past outpatient treatment  Past Medical History:  Past Medical History:  Diagnosis Date   Allergy    Anxiety    Blood transfusion without reported diagnosis    Breast cancer (HCC)    Breast cancer (HCC) 04/21/2012   Stage II (T1c N1 M0) grade 3 triple negative breast cancer, right- sided with 1 of 5 positive nodes, status post FEC in a dose dense fashion for 6 cycles followed by radiation therapy by Dr. Roselind Messier for her high-risk disease with her initial date of surgery in 07/15/06.    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   COPD (chronic obstructive pulmonary disease) (HCC)    Depression    Emphysema of lung (HCC)    GERD (gastroesophageal reflux disease) 12/18/2012   Headache    Hypercholesteremia    Hyperlipidemia    Neuropathy    Osteoporosis    PONV (postoperative nausea and vomiting)    PTSD (post-traumatic stress disorder)  Substance abuse [facility])     Past Surgical History:  Procedure Laterality Date   BIOPSY  11/05/2017   Procedure: BIOPSY;  Surgeon: West Bali, MD;  Location: [facility] ENDO SUITE;  Service: Endoscopy;;  colon duodenum gastric   BIOPSY  03/04/2023   Procedure: BIOPSY;  Surgeon: Lanelle Bal, DO;  Location: [facility];  Service: Endoscopy;;   BREAST SURGERY     CATARACT EXTRACTION W/PHACO Left 08/15/2017   Procedure: CATARACT EXTRACTION WITH PHACOEMULSIFICATION  AND INTRAOCULAR LENS PLACEMENT LEFT EYE CDE=2.68;  Surgeon: Fabio Pierce, MD;  Location: [facility] ORS;  Service: Ophthalmology;  Laterality: Left;  left    CATARACT EXTRACTION W/PHACO Right 09/06/2017   Procedure: CATARACT EXTRACTION PHACO AND INTRAOCULAR LENS PLACEMENT (IOC);  Surgeon: Fabio Pierce, MD;  Location: [facility] ORS;  Service: Ophthalmology;  Laterality: Right;  CDE: 1.61   COLONOSCOPY WITH PROPOFOL N/A 11/05/2017    6 mm polyp in the sigmoid colon which was sessile and removed.  Rectosigmoid colon and sigmoid colon biopsies taken for evaluation for microscopic colitis.  Intermittent rectal bleeding due to sigmoid colon polyp and internal hemorrhoids. Normal colonic biopsies. Tubular adenoma. Repeat surveillance 5-10 years.    COLONOSCOPY WITH PROPOFOL N/A 03/04/2023   Procedure: COLONOSCOPY WITH PROPOFOL;  Surgeon: Lanelle Bal, DO;  Location: [facility];  Service: Endoscopy;  Laterality: N/A;  2:15 pm, asa 3   ESOPHAGOGASTRODUODENOSCOPY  03/10/2008   SLF: Normal esophagus without evidence of Barrett, mass, erosion  ulceration, or stricture, small bowel bx negative, gastritis with NO h.pylori   ESOPHAGOGASTRODUODENOSCOPY (EGD) WITH PROPOFOL N/A 11/05/2017   Mild gastritis and duodenitis.    leocolonoscopy  03/10/2008   ZOX:WRUEAV terminal ileum, approximately 10 cm visualized/Normal colon without evidence of polyps, mass, inflammatory changes, diverticula, or AVMs/Normal retroflexed view of the rectum. random colon bx negative.   MASTECTOMY     bilateral, status post reconstruction.   POLYPECTOMY  11/05/2017   Procedure: POLYPECTOMY;  Surgeon: [place] Bali, MD;  Location: [facility];  Service: Endoscopy;;  colon   TUBAL LIGATION      Family Psychiatric History: See below  Family History:  Family History  Problem Relation Age of Onset   COPD Mother    Depression Mother    Heart disease Mother 35       Stents   Hyperlipidemia Mother    Hypertension Mother    Cancer Mother        lung cancer, to bone   Cancer Father        skin   Heart disease Father 80       Stents, pacemaker   Hypertension Father    Hyperlipidemia  Father    Learning disabilities Father    Drug abuse Brother        overdose at 58   Drug abuse Daughter        drug overdose 32   Crohn's disease Maternal Aunt    Cancer Maternal Uncle        lung cancer   Crohn's disease Cousin    Colon cancer Neg Hx     Social History:  Social History   Socioeconomic History   Marital status: Divorced    Spouse name: Not on file   Number of children: 2   Years of education: 13   Highest education level: Not on file  Occupational History   Occupation: disabled    Comment: neuropathy from chemo  Tobacco Use   Smoking status: Every Day  Current packs/day: 1.00    Average packs/day: 1 pack/day for 31.1 years (31.1 ttl pk-yrs)    Types: Cigarettes    Start date: 05/07/1992   Smokeless tobacco: Never   Tobacco comments:    discussed  Vaping Use   Vaping status: Never Used  Substance and Sexual Activity   Alcohol use: Not Currently   Drug use: Yes    Types: Marijuana   Sexual activity: Yes    Birth control/protection: Post-menopausal  Other Topics Concern   Not on file  Social History Narrative   Divorced.  Lives with boyfriend.   One child passed and one still living, lives in [place], reports sees regularly      Social Drivers of Health   Financial Resource Strain: Low Risk  (09/26/2022)   Overall Financial Resource Strain (CARDIA)    Difficulty of Paying Living Expenses: Not hard at all  Food Insecurity: No Food Insecurity (09/26/2022)   Hunger Vital Sign    Worried About Running Out of Food in the Last Year: Never true    Ran Out of Food in the Last Year: Never true  Transportation Needs: No Transportation Needs (09/26/2022)   PRAPARE - Administrator, Civil Service (Medical): No    Lack of Transportation (Non-Medical): No  Physical Activity: Inactive (09/26/2022)   Exercise Vital Sign    Days of Exercise per Week: 0 days    Minutes of Exercise per Session: 0 min  Stress: No Stress Concern Present  (09/26/2022)   [employer] of Occupational Health - Occupational Stress Questionnaire    Feeling of Stress : Not at all  Social Connections: Moderately Isolated (09/26/2022)   Social Connection and Isolation Panel [NHANES]    Frequency of Communication with Friends and Family: More than three times a week    Frequency of Social Gatherings with Friends and Family: Once a week    Attends Religious Services: Never    Database administrator or Organizations: No    Attends Banker Meetings: Never    Marital Status: Living with partner    Allergies:  Allergies  Allergen Reactions   Vicodin [Hydrocodone-Acetaminophen] Nausea Only    Metabolic Disorder Labs: Lab Results  Component Value Date   HGBA1C 5.5 06/03/2023   No results found for: "PROLACTIN" Lab Results  Component Value Date   CHOL 226 (H) 06/03/2023   TRIG 106 06/03/2023   HDL 57 06/03/2023   CHOLHDL 4.0 06/03/2023   VLDL 42 (H) 11/23/2016   LDLCALC 150 (H) 06/03/2023   LDLCALC 99 12/18/2019   Lab Results  Component Value Date   TSH 2.480 09/21/2022   TSH 3.110 01/17/2021    Therapeutic Level Labs: No results found for: "LITHIUM" No results found for: "VALPROATE" No results found for: "CBMZ"  Current Medications: Current Outpatient Medications  Medication Sig Dispense Refill   albuterol (VENTOLIN HFA) 108 (90 Base) MCG/ACT inhaler INHALE 2 PUFFS BY MOUTH EVERY 6 HOURS AS NEEDED FOR WHEEZING FOR SHORTNESS OF BREATH 9 g 2   cholecalciferol (VITAMIN D) 1000 units tablet Take 1,000 Units by mouth daily.     citalopram (CELEXA) 20 MG tablet Take 1 tablet (20 mg total) by mouth daily. 30 tablet 2   diazepam (VALIUM) 10 MG tablet Take 1 tablet (10 mg total) by mouth 3 (three) times daily. 90 tablet 2   dicyclomine (BENTYL) 10 MG capsule Take 1 capsule (10 mg total) by mouth 3 (three) times daily before meals. 90 capsule  3   fexofenadine (ALLEGRA) 180 MG tablet Take 1 tablet (180 mg total) by mouth  daily as needed for allergies or rhinitis. 90 tablet 3   omeprazole (PRILOSEC) 40 MG capsule Take 1 capsule (40 mg total) by mouth daily. 90 capsule 0   omeprazole (PRILOSEC) 40 MG capsule Take 1 capsule (40 mg total) by mouth daily. 90 capsule 0   pravastatin (PRAVACHOL) 20 MG tablet Take 1 tablet (20 mg total) by mouth daily. 90 tablet 3   prazosin (MINIPRESS) 2 MG capsule Take 1 capsule (2 mg total) by mouth at bedtime. 30 capsule 0   temazepam (RESTORIL) 30 MG capsule Take 1 capsule (30 mg total) by mouth at bedtime. 30 capsule 0   No current facility-administered medications for this visit.     Musculoskeletal: Strength & Muscle Tone: na Gait & Station: na Patient leans: N/A  Psychiatric Specialty Exam: Review of Systems  All other systems reviewed and are negative.   Last menstrual period 07/09/2013.There is no height or weight on file to calculate BMI.  General Appearance: NA  Eye Contact:  NA  Speech:  Clear and Coherent  Volume:  Normal  Mood:  Euthymic  Affect:  NA  Thought Process:  Goal Directed  Orientation:  Full (Time, Place, and Person)  Thought Content: WDL   Suicidal Thoughts:  No  Homicidal Thoughts:  No  Memory:  Immediate;   Good Recent;   Good Remote;   Good  Judgement:  Good  Insight:  Good  Psychomotor Activity:  Normal  Concentration:  Concentration: Good and Attention Span: Good  Recall:  Good  Fund of Knowledge: Good  Language: Good  Akathisia:  No  Handed:  Right  AIMS (if indicated): not done  Assets:  Communication Skills Desire for Improvement Resilience Social Support Talents/Skills  ADL's:  Intact  Cognition: WNL  Sleep:  Good   Screenings: AUDIT    Flowsheet Row Clinical Support from 04/26/2021 in [facility]  Alcohol Use Disorder Identification Test Final Score (AUDIT) 4      GAD-7    Flowsheet Row Office Visit from 06/03/2023 in [facility]  Visit from 09/21/2022 in [facility] Office Visit from 01/10/2022 in [facility] Office Visit from 05/31/2021 in [facility] Office Visit from 01/17/2021 in [facility]  Total GAD-7 Score 21 16 19 19 17       PHQ2-9    Flowsheet Row Office Visit from 06/03/2023 in [facility] Clinical Support from 09/26/2022 in [facility] Office Visit from 09/21/2022 in [facility] Office Visit from 01/10/2022 in [facility] Video Visit from 12/06/2021 in [facility] Total Score 6 4 6  0 0  PHQ-9 Total Score 22 11 12 15  --      Flowsheet Row Pre-Admission Testing 62 from 02/28/2023 in [facility] Video Visit from 12/06/2021 in [facility] [place] Video Visit from 09/05/2021 in [facility] [place]  C-SSRS RISK CATEGORY No Risk No Risk No Risk        Assessment and Plan: This patient is a 63 year old female with a history of depression anxiety and PTSD.  She seems to be doing well on her current regimen.  She will continue Celexa 20 mg daily for depression, Valium 10 mg 3 times daily for anxiety, Restoril 30 mg at bedtime for sleep and prazosin 2 mg at bedtime to prevent nightmares.  She will return to see me in 3 months  Collaboration of Care: Collaboration of Care: Primary Care Provider AEB notes will be shared with PCP at patient's request  Patient/Guardian was advised Release of Information must be obtained prior to any record release in order to collaborate their care with an outside provider. Patient/Guardian was advised if they have not already done so to contact the registration department to sign all  necessary forms in order for [place] to release information regarding their care.   Consent: Patient/Guardian gives verbal consent for treatment and assignment of benefits for services provided during this visit. Patient/Guardian expressed understanding and agreed to proceed.    Diannia Ruder, MD 06/21/2023, 11:04 AM

## 2023-08-06 ENCOUNTER — Ambulatory Visit: Admitting: Orthopedic Surgery

## 2023-08-08 ENCOUNTER — Other Ambulatory Visit (HOSPITAL_COMMUNITY): Payer: No Typology Code available for payment source

## 2023-08-11 ENCOUNTER — Emergency Department (HOSPITAL_COMMUNITY)

## 2023-08-11 ENCOUNTER — Encounter (HOSPITAL_COMMUNITY): Payer: Self-pay | Admitting: Emergency Medicine

## 2023-08-11 ENCOUNTER — Emergency Department (HOSPITAL_COMMUNITY)
Admission: EM | Admit: 2023-08-11 | Discharge: 2023-08-11 | Disposition: A | Discharge status: Home / Self Care | Attending: Emergency Medicine | Admitting: Emergency Medicine

## 2023-08-11 ENCOUNTER — Other Ambulatory Visit: Payer: Self-pay

## 2023-08-11 DIAGNOSIS — S52532A Colles' fracture of left radius, initial encounter for closed fracture: Secondary | ICD-10-CM | POA: Insufficient documentation

## 2023-08-11 DIAGNOSIS — S52352A Displaced comminuted fracture of shaft of radius, left arm, initial encounter for closed fracture: Secondary | ICD-10-CM | POA: Diagnosis not present

## 2023-08-11 DIAGNOSIS — F17200 Nicotine dependence, unspecified, uncomplicated: Secondary | ICD-10-CM | POA: Diagnosis not present

## 2023-08-11 DIAGNOSIS — S52612A Displaced fracture of left ulna styloid process, initial encounter for closed fracture: Secondary | ICD-10-CM | POA: Diagnosis not present

## 2023-08-11 DIAGNOSIS — S6992XA Unspecified injury of left wrist, hand and finger(s), initial encounter: Secondary | ICD-10-CM | POA: Diagnosis present

## 2023-08-11 DIAGNOSIS — M7989 Other specified soft tissue disorders: Secondary | ICD-10-CM | POA: Diagnosis not present

## 2023-08-11 DIAGNOSIS — W010XXA Fall on same level from slipping, tripping and stumbling without subsequent striking against object, initial encounter: Secondary | ICD-10-CM | POA: Insufficient documentation

## 2023-08-11 MED ORDER — LIDOCAINE-EPINEPHRINE (PF) 2 %-1:200000 IJ SOLN
20.0000 mL | Freq: Once | INTRAMUSCULAR | Status: AC
  Administered 2023-08-11: 20 mL via INTRADERMAL
  Filled 2023-08-11: qty 20

## 2023-08-11 MED ORDER — LIDOCAINE-EPINEPHRINE 2 %-1:100000 IJ SOLN: 20.0000 mL | Freq: Once | INTRAMUSCULAR | Status: DC

## 2023-08-11 MED ORDER — ONDANSETRON HCL 4 MG/2ML IJ SOLN
4.0000 mg | Freq: Once | INTRAMUSCULAR | Status: AC
  Administered 2023-08-11: 4 mg via INTRAVENOUS
  Filled 2023-08-11: qty 2

## 2023-08-11 MED ORDER — OXYCODONE-ACETAMINOPHEN 5-325 MG PO TABS: 1.0000 | ORAL_TABLET | Freq: Four times a day (QID) | ORAL | 0 refills | Status: DC | PRN

## 2023-08-11 MED ORDER — HYDROMORPHONE HCL 1 MG/ML IJ SOLN
1.0000 mg | Freq: Once | INTRAMUSCULAR | Status: AC
  Administered 2023-08-11: 1 mg via INTRAVENOUS
  Filled 2023-08-11: qty 1

## 2023-08-11 NOTE — ED Provider Notes (Signed)
  EMERGENCY DEPARTMENT AT [facility] Provider Note   CSN: [id number] Arrival date & time: 08/11/23  1044     History  Chief Complaint  Patient presents with   Wrist Pain    [name] is a 63 y.o. female.   Wrist Pain   This patient is a 63 year old female, she does not take any anticoagulants, she presents after having an accidental fall just prior to arrival where she stumbled backwards and tried to catch herself with her left hand, she is left-hand dominant.  She had acute onset of pain in her left wrist and noticed deformity.  She has numbness of her hand after this occurred.  No open wounds no bleeding no other injuries    Home Medications Prior to Admission medications   Medication Sig Start Date End Date Taking? Authorizing Provider  oxyCODONE-acetaminophen (PERCOCET/ROXICET) 5-325 MG tablet Take 1 tablet by mouth every 6 (six) hours as needed for severe pain (pain score 7-10). 08/11/23  Yes Eber Hong, MD  albuterol (VENTOLIN HFA) 108 (90 Base) MCG/ACT inhaler INHALE 2 PUFFS BY MOUTH EVERY 6 HOURS AS NEEDED FOR WHEEZING FOR SHORTNESS OF BREATH 05/22/21   Delynn Flavin M, DO  cholecalciferol (VITAMIN D) 1000 units tablet Take 1,000 Units by mouth daily.    [provider]  citalopram (CELEXA) 20 MG tablet Take 1 tablet (20 mg total) by mouth daily. 06/21/23   Myrlene Broker, MD  diazepam (VALIUM) 10 MG tablet Take 1 tablet (10 mg total) by mouth 3 (three) times daily. 06/21/23   Myrlene Broker, MD  dicyclomine (BENTYL) 10 MG capsule TAKE 1 CAPSULE BY MOUTH THREE TIMES DAILY BEFORE MEAL(S) 06/26/23   Letta Median, PA-C  fexofenadine (ALLEGRA) 180 MG tablet Take 1 tablet (180 mg total) by mouth daily as needed for allergies or rhinitis. 03/14/18   Raliegh Ip, DO  omeprazole (PRILOSEC) 40 MG capsule Take 1 capsule (40 mg total) by mouth daily. 06/04/23   Raliegh Ip, DO  omeprazole (PRILOSEC) 40 MG capsule Take 1 capsule  (40 mg total) by mouth daily. 06/04/23   Raliegh Ip, DO  pravastatin (PRAVACHOL) 20 MG tablet Take 1 tablet (20 mg total) by mouth daily. 12/21/19   Raliegh Ip, DO  prazosin (MINIPRESS) 2 MG capsule Take 1 capsule (2 mg total) by mouth at bedtime. 06/21/23   Myrlene Broker, MD  temazepam (RESTORIL) 30 MG capsule Take 1 capsule (30 mg total) by mouth at bedtime. 06/21/23   Myrlene Broker, MD      Allergies    Vicodin [hydrocodone-acetaminophen]    Review of Systems   Review of Systems  Musculoskeletal:  Positive for arthralgias.  Skin:  Negative for wound.  Neurological:  Positive for numbness.    Physical Exam Updated Vital Signs BP (!) 144/80   Pulse 81   Temp 97.8 F (36.6 C)   Resp 18   Ht 1.626 m (5\' 4" )   Wt 68 kg   LMP 07/09/2013   SpO2 96%   BMI 25.75 kg/m  Physical Exam Vitals and nursing note reviewed.  Constitutional:      Appearance: She is well-developed. She is not diaphoretic.  HENT:     Head: Normocephalic and atraumatic.  Eyes:     General:        Right eye: No discharge.        Left eye: No discharge.     Conjunctiva/sclera: Conjunctivae normal.  Cardiovascular:  Comments: Normal heart rate, normal pulses, normal capillary refill to the left hand Pulmonary:     Effort: Pulmonary effort is normal. No respiratory distress.  Musculoskeletal:        General: Swelling, tenderness, deformity and signs of injury present.     Right lower leg: No edema.     Left lower leg: No edema.     Comments: All 4 extremities examined, the only injury is the left upper extremity at the wrist, there appears to be a distal radial ulnar fracture consistent with a Colles' type fracture.  Skin:    General: Skin is warm and dry.     Findings: No erythema or rash.  Neurological:     Mental Status: She is alert.     Coordination: Coordination normal.     Comments: Normal strength and sensation of the left hand limited only by pain     ED Results /  Procedures / Treatments   Labs (all labs ordered are listed, but only abnormal results are displayed) Labs Reviewed - No data to display  EKG None  Radiology DG Wrist Complete Left Result Date: 08/11/2023 CLINICAL DATA:  Postreduction of left wrist. EXAM: LEFT WRIST - COMPLETE 3+ VIEW COMPARISON:  08/11/2023. FINDINGS: There is redemonstration of the comminuted impaction fracture of the distal radius with persistent dorsal displacement of the distal fracture fragment. Ulnar styloid fracture is unchanged. Soft tissue swelling is present about the wrist. Examination is limited due to overlying casting material. IMPRESSION: Fractures of the distal radius and ulnar styloid, unchanged from the previous exam. Electronically Signed   By: Thornell Sartorius M.D.   On: 08/11/2023 12:29   DG Wrist Complete Left Result Date: 08/11/2023 CLINICAL DATA:  Trauma with deformity. Left wrist pain after tripping and falling backwards. EXAM: LEFT WRIST - COMPLETE 3+ VIEW; LEFT FOREARM - 2 VIEW COMPARISON:  None Available. FINDINGS: There is a comminuted impaction fracture of the distal radius with dorsal angulation. There is a minimally displaced ulnar styloid fracture. No dislocation is seen. There is no joint effusion at the elbow. Soft tissue swelling is present about the wrist and distal forearm. IMPRESSION: 1. Comminuted impaction fracture of the distal radius with dorsal angulation. 2. Minimally displaced styloid fracture. Electronically Signed   By: Thornell Sartorius M.D.   On: 08/11/2023 11:46   DG Forearm Left Result Date: 08/11/2023 CLINICAL DATA:  Trauma with deformity. Left wrist pain after tripping and falling backwards. EXAM: LEFT WRIST - COMPLETE 3+ VIEW; LEFT FOREARM - 2 VIEW COMPARISON:  None Available. FINDINGS: There is a comminuted impaction fracture of the distal radius with dorsal angulation. There is a minimally displaced ulnar styloid fracture. No dislocation is seen. There is no joint effusion at the elbow.  Soft tissue swelling is present about the wrist and distal forearm. IMPRESSION: 1. Comminuted impaction fracture of the distal radius with dorsal angulation. 2. Minimally displaced styloid fracture. Electronically Signed   By: Thornell Sartorius M.D.   On: 08/11/2023 11:46    Procedures .Reduction of fracture  Date/Time: 08/11/2023 12:50 PM  Performed by: Eber Hong, MD Authorized by: Eber Hong, MD  Consent: Verbal consent obtained. Risks and benefits: risks, benefits and alternatives were discussed Consent given by: patient Patient understanding: patient states understanding of the procedure being performed Imaging studies: imaging studies available Required items: required blood products, implants, devices, and special equipment available Patient identity confirmed: verbally with patient Time out: Immediately prior to procedure a "time out" was called to verify  the correct patient, procedure, equipment, support staff and site/side marked as required. Preparation: Patient was prepped and draped in the usual sterile fashion. Local anesthesia used: yes Anesthesia: hematoma block  Anesthesia: Local anesthesia used: yes Local Anesthetic: lidocaine 1% with epinephrine Anesthetic total: 10 mL  Sedation: Patient sedated: no  Patient tolerance: patient tolerated the procedure well with no immediate complications Comments:         Medications Ordered in ED Medications  lidocaine-EPINEPHrine (XYLOCAINE W/EPI) 2 %-1:200000 (PF) injection 20 mL (has no administration in time range)  HYDROmorphone (DILAUDID) injection 1 mg (1 mg Intravenous Given 08/11/23 1119)  ondansetron (ZOFRAN) injection 4 mg (4 mg Intravenous Given 08/11/23 1119)    ED Course/ Medical Decision Making/ A&P                                 Medical Decision Making Amount and/or Complexity of Data Reviewed Radiology: ordered.  Risk Prescription drug management.    This patient presents to the ED for concern  of injury to the left distal forearm differential diagnosis includes x-ray of the hand wrist and forearm    Additional history obtained:  Additional history obtained from medical record External records from outside source obtained and reviewed including no recent admissions   Lab Tests:  I Ordered, and personally interpreted labs.  The pertinent results include: Not indicated   Imaging Studies ordered:  I ordered imaging studies including trays of the wrist and forearm I independently visualized and interpreted imaging which showed distal radial fracture, repeat x-rays were obtained after reduction with slight improvement I agree with the radiologist interpretation   Medicines ordered and prescription drug management:  I ordered medication including hydromorphone for pain Reevaluation of the patient after these medicines showed that the patient improved I have reviewed the patients home medicines and have made adjustments as needed   Problem List / ED Course:  Fracture attempt at reduction with some improvement, discussed with orthopedics Dr. Dallas Schimke who agrees with the plan and will see the patient Tuesday or Wednesday and after seeing x-rays agrees this can go home and follow-up for outpatient surgery   Social Determinants of Health:  Continues to smoke tobacco           Final Clinical Impression(s) / ED Diagnoses Final diagnoses:  Closed Colles' fracture of left radius, initial encounter    Rx / DC Orders ED Discharge Orders          Ordered    oxyCODONE-acetaminophen (PERCOCET/ROXICET) 5-325 MG tablet  Every 6 hours PRN        08/11/23 1248              Eber Hong, MD 08/11/23 1251

## 2023-08-11 NOTE — ED Notes (Signed)
 Pt ready for d/c, waiting for ride as she was given dilaudid on arrival to ED.  Pt remains drowsy, states her son will come and get her but it may be after he gets off work.  Pt encouraged to rest on ED stretcher and try to get in touch with son.  Pt verbalizes understanding at this time.

## 2023-08-11 NOTE — Discharge Instructions (Signed)
 I have discussed your care with the orthopedic surgeon on-call, I have given you his phone number and address, he said he can either see you on Tuesday or Wednesday, please call the office for the follow-up appointment.  I have also prescribed a medication called Percocet which is oxycodone and acetaminophen.  Take 1 tablet every 6 hours as needed only for severe pain.  If it makes you nauseated take Zofran.  ER for severe worsening symptoms  I suspect that this will be a surgical repair so please make sure that you are avoiding anti-inflammatories as this may make you bleed, that means no ibuprofen or Aleve or aspirin containing medications until you see the orthopedic surgeon.

## 2023-08-11 NOTE — ED Triage Notes (Signed)
 Pt to ER via EMS form home with c/o left wrist pain and deformity after tripping and falling backwards.  PMS intact, EDP to room to assess.  Arm placed on pillows and ice pack applied.

## 2023-08-12 ENCOUNTER — Ambulatory Visit: Payer: Self-pay

## 2023-08-12 ENCOUNTER — Other Ambulatory Visit: Payer: Self-pay | Admitting: Family Medicine

## 2023-08-12 DIAGNOSIS — R112 Nausea with vomiting, unspecified: Secondary | ICD-10-CM

## 2023-08-12 MED ORDER — ONDANSETRON 4 MG PO TBDP: 4.0000 mg | ORAL_TABLET | Freq: Three times a day (TID) | ORAL | 0 refills | Status: DC | PRN

## 2023-08-12 NOTE — Telephone Encounter (Signed)
 Yes, oxycodone IS related to vicodin.  I can send nausea meds if she wants.  Ok to offer her an OV tomorrow if needed for pain management. Must be done IN an office visit because they are controlled meds and are illegal to prescribed outside of a face to face visit.

## 2023-08-12 NOTE — Telephone Encounter (Signed)
 Patient called, left VM to return the call to the office to speak to NT.    Copied From CRM [id number]. Reason for Triage: Patient broke her wrist was prescribed oxyCODONE-acetaminophen (PERCOCET/ROXICET) 5-325 MG tablet stated she has nausea and headache , wanted to know if she could take tylenol with that or is there anything else she can be take to help the headache and nausea would like a callback regarding this  [phone]

## 2023-08-12 NOTE — Telephone Encounter (Signed)
 Another encounter on this routed to pcp

## 2023-08-12 NOTE — Telephone Encounter (Signed)
 Patient called in stating she was prescribed oxycodone-acetaminophen 5-325 mg for her broken wrist. Patient took first dose last night and got nauseous, and when she took second dose she had a vomiting episode. Patient has vomited when she takes each dose. Patient states she has allergy to Vicodin and is uncertain if this is causing the thing. However; patient states her pain needs controlled. Patient has emphasized that this medication is not touching the pain at all. Patient is inquiring if pain medication can be switched. Please advise.   Copied from CRM [id number]. Topic: Clinical - Pink Word Triage >> Aug 12, 2023 10:48 AM [name] wrote: Reason for Triage: Patient broke her wrist was prescribed oxyCODONE-acetaminophen (PERCOCET/ROXICET) 5-325 MG tablet stated she has nausea and headache , wanted to know if she could take tylenol with that or is there anything else she can be take to help the headache and nausea would like a callback regarding this   [phone] Reason for Disposition  Unexplained nausea    Please see notes  Answer Assessment - Initial Assessment Questions 1. NAUSEA SEVERITY: "How bad is the nausea?" (e.g., mild, moderate, severe; dehydration, weight loss)   - MILD: loss of appetite without change in eating habits   - MODERATE: decreased oral intake without significant weight loss, dehydration, or malnutrition   - SEVERE: inadequate caloric or fluid intake, significant weight loss, symptoms of dehydration     Feeling nausea when patient takes the percocet and causing patient to throw up  2. ONSET: "When did the nausea begin?"     When patient took first oxycodone last night 3. VOMITING: "Any vomiting?" If Yes, ask: "How many times today?"     Patient has vomiting and headache 4. RECURRENT SYMPTOM: "Have you had nausea before?" If Yes, ask: "When was the last time?" "What happened that time?"     N/a 5. CAUSE: "What do you think is causing the nausea?"     Patient believes  it's oxycodone  Protocols used: Nausea-A-AH

## 2023-08-12 NOTE — Telephone Encounter (Signed)
 This RN made second attempt to triage patient. No answer, left a message. Routing for additional attempts.

## 2023-08-12 NOTE — Telephone Encounter (Signed)
 Unable to reach patient after 3 attempts by E2C2 NT, routing to the provider for resolution per protocol.   Copied From CRM [id number]. Reason for Triage: Patient broke her wrist was prescribed oxyCODONE-acetaminophen (PERCOCET/ROXICET) 5-325 MG tablet stated she has nausea and headache , wanted to know if she could take tylenol with that or is there anything else she can be take to help the headache and nausea would like a callback regarding this   [phone]

## 2023-08-13 ENCOUNTER — Ambulatory Visit (INDEPENDENT_AMBULATORY_CARE_PROVIDER_SITE_OTHER): Admitting: Orthopedic Surgery

## 2023-08-13 ENCOUNTER — Encounter: Payer: Self-pay | Admitting: Orthopedic Surgery

## 2023-08-13 VITALS — BP 115/79 | HR 93

## 2023-08-13 DIAGNOSIS — S52532A Colles' fracture of left radius, initial encounter for closed fracture: Secondary | ICD-10-CM

## 2023-08-13 MED ORDER — OXYCODONE HCL 5 MG PO TABS: 5.0000 mg | ORAL_TABLET | Freq: Four times a day (QID) | ORAL | 0 refills | Status: DC | PRN

## 2023-08-13 MED ORDER — ONDANSETRON HCL 4 MG PO TABS: 4.0000 mg | ORAL_TABLET | Freq: Three times a day (TID) | ORAL | 0 refills | Status: AC | PRN

## 2023-08-13 NOTE — Addendum Note (Signed)
 Addended by: Baird Kay on: 08/13/2023 12:07 PM   Modules accepted: Orders

## 2023-08-13 NOTE — Patient Instructions (Signed)
 Your surgery will be at [facility], scheduled with Dr Thane Edu   The hospital will contact you with a preoperative appointment to discuss Anesthesia. The phone number is [phone]   Please bring your medications with you for the appointment.  They will tell you the arrival time and medication instructions when you have your preoperative evaluation.  Do not wear nail polish the day of your surgery and if you take Phentermine you need to stop this medication ONE WEEK prior to your surgery.    If you take an blood thinning medication, we will need to stop this prior to surgery.  Typically, we stop this medicine at least 5 days prior to surgery.  We will need to confirm this with the doctor who prescribes this medication.  If you are taking medications or an injection for diabetes, or for weight management, this medicine will need to be stopped at least 7 days prior to surgery.     Surgery will be scheduled for 08/16/23 pending authorization by your insurance company.

## 2023-08-13 NOTE — H&P (View-Only) (Signed)
 New Patient Visit  Assessment: [name] is a 63 y.o. female with the following: 1. Closed Colles' fracture of left radius, initial encounter  Plan: [name] fell and sustained a left distal radius fracture.  Based on the nature of the injury, I recommended operative fixation.  She is left-hand-dominant, and will benefit from surgery.  Will plan to proceed with surgery on 08/16/2023.  Risks and benefits have been discussed.  Updated medications have been provided.  Risks and benefits of surgery, including, but not limited to infection, bleeding, persistent pain, damage to surrounding structures, need for further surgery, malunion, nonunion and more severe complications associated with anesthesia were discussed.  All questions have been answered and they have elected to proceed with surgery.    Follow-up: Return for After surgery; DOS 08/16/23.  Subjective:  Chief Complaint  Patient presents with   Fracture    L distal Radius DOI 08/11/23    History of Present Illness: [name] is a 63 y.o. female who presents for evaluation of left wrist pain.  She is left-hand dominant.  She states that she fell backwards, and tried to brace her fall with her left arm.  She had immediate pain.  She presented to the emergency department.  I reduced the left wrist, with improved alignment.  She has remained in the splint.  Pain medications have been helpful, but make her nauseous.   Review of Systems: No fevers or chills No numbness or tingling No chest pain No shortness of breath No bowel or bladder dysfunction No GI distress No headaches   Medical History:  Past Medical History:  Diagnosis Date   Allergy    Anxiety    Blood transfusion without reported diagnosis    Breast cancer (HCC)    Breast cancer (HCC) 04/21/2012   Stage II (T1c N1 M0) grade 3 triple negative breast cancer, right- sided with 1 of 5 positive nodes, status post FEC in a dose dense fashion for 6 cycles  followed by radiation therapy by Dr. Roselind Messier for her high-risk disease with her initial date of surgery in February 2008.    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   COPD (chronic obstructive pulmonary disease) (HCC)    Depression    Emphysema of lung (HCC)    GERD (gastroesophageal reflux disease) 12/18/2012   Headache    Hypercholesteremia    Hyperlipidemia    Neuropathy    Osteoporosis    PONV (postoperative nausea and vomiting)    PTSD (post-traumatic stress disorder)    Substance abuse [facility])     Past Surgical History:  Procedure Laterality Date   BIOPSY  11/05/2017   Procedure: BIOPSY;  Surgeon: West Bali, MD;  [facility]: [facility];  Service: Endoscopy;;  colon duodenum gastric   BIOPSY  03/04/2023   Procedure: BIOPSY;  Surgeon: Lanelle Bal, DO;  [facility]: [facility];  Service: Endoscopy;;   BREAST SURGERY     CATARACT EXTRACTION W/PHACO Left 08/15/2017   Procedure: CATARACT EXTRACTION WITH PHACOEMULSIFICATION  AND INTRAOCULAR LENS PLACEMENT LEFT EYE CDE=2.68;  Surgeon: Fabio Pierce, MD;  Location: [facility];  Service: Ophthalmology;  Laterality: Left;  left   CATARACT EXTRACTION W/PHACO Right 09/06/2017   Procedure: CATARACT EXTRACTION PHACO AND INTRAOCULAR LENS PLACEMENT (IOC);  Surgeon: Fabio Pierce, MD;  Location: [facility];  Service: Ophthalmology;  Laterality: Right;  CDE: 1.61   COLONOSCOPY WITH PROPOFOL N/A 11/05/2017    6 mm polyp in the sigmoid colon which was sessile  and removed.  Rectosigmoid colon and sigmoid colon biopsies taken for evaluation for microscopic colitis.  Intermittent rectal bleeding due to sigmoid colon polyp and internal hemorrhoids. Normal colonic biopsies. Tubular adenoma. Repeat surveillance 5-10 years.    COLONOSCOPY WITH PROPOFOL N/A 03/04/2023   Procedure: COLONOSCOPY WITH PROPOFOL;  Surgeon: Lanelle Bal, DO;  [facility];  Service: Endoscopy;  Laterality: N/A;  2:15 pm, asa 3   ESOPHAGOGASTRODUODENOSCOPY   03/10/2008   SLF: Normal esophagus without evidence of Barrett, mass, erosion  ulceration, or stricture, small bowel bx negative, gastritis with NO h.pylori   ESOPHAGOGASTRODUODENOSCOPY (EGD) WITH PROPOFOL N/A 11/05/2017   Mild gastritis and duodenitis.    leocolonoscopy  03/10/2008   ZOX:WRUEAV terminal ileum, approximately 10 cm visualized/Normal colon without evidence of polyps, mass, inflammatory changes, diverticula, or AVMs/Normal retroflexed view of the rectum. random colon bx negative.   MASTECTOMY     bilateral, status post reconstruction.   POLYPECTOMY  11/05/2017   Procedure: POLYPECTOMY;  Surgeon: West Bali, MD;  [facility]: [facility];  Service: Endoscopy;;  colon   TUBAL LIGATION      Family History  Problem Relation Age of Onset   COPD Mother    Depression Mother    Heart disease Mother 31       Stents   Hyperlipidemia Mother    Hypertension Mother    Cancer Mother        lung cancer, to bone   Cancer Father        skin   Heart disease Father 83       Stents, pacemaker   Hypertension Father    Hyperlipidemia Father    Learning disabilities Father    Drug abuse Brother        overdose at 63   Drug abuse Daughter        drug overdose 32   Crohn's disease Maternal Aunt    Cancer Maternal Uncle        lung cancer   Crohn's disease Cousin    Colon cancer Neg Hx    Social History   Tobacco Use   Smoking status: Every Day    Current packs/day: 1.00    Average packs/day: 1 pack/day for 31.3 years (31.3 ttl pk-yrs)    Types: Cigarettes    Start date: 05/07/1992   Smokeless tobacco: Never   Tobacco comments:    discussed  Vaping Use   Vaping status: Never Used  Substance Use Topics   Alcohol use: Not Currently   Drug use: Yes    Types: Marijuana    Allergies  Allergen Reactions   Vicodin [Hydrocodone-Acetaminophen] Nausea Only    Current Meds  Medication Sig   albuterol (VENTOLIN HFA) 108 (90 Base) MCG/ACT inhaler INHALE 2 PUFFS BY MOUTH EVERY 6  HOURS AS NEEDED FOR WHEEZING FOR SHORTNESS OF BREATH   cholecalciferol (VITAMIN D) 1000 units tablet Take 1,000 Units by mouth daily.   citalopram (CELEXA) 20 MG tablet Take 1 tablet (20 mg total) by mouth daily.   diazepam (VALIUM) 10 MG tablet Take 1 tablet (10 mg total) by mouth 3 (three) times daily.   dicyclomine (BENTYL) 10 MG capsule TAKE 1 CAPSULE BY MOUTH THREE TIMES DAILY BEFORE MEAL(S)   fexofenadine (ALLEGRA) 180 MG tablet Take 1 tablet (180 mg total) by mouth daily as needed for allergies or rhinitis.   omeprazole (PRILOSEC) 40 MG capsule Take 1 capsule (40 mg total) by mouth daily.   omeprazole (PRILOSEC) 40  MG capsule Take 1 capsule (40 mg total) by mouth daily.   ondansetron (ZOFRAN) 4 MG tablet Take 1 tablet (4 mg total) by mouth every 8 (eight) hours as needed for up to 14 days for nausea or vomiting.   ondansetron (ZOFRAN-ODT) 4 MG disintegrating tablet Take 1 tablet (4 mg total) by mouth every 8 (eight) hours as needed for nausea or vomiting.   oxyCODONE (ROXICODONE) 5 MG immediate release tablet Take 1 tablet (5 mg total) by mouth every 6 (six) hours as needed for up to 7 days.   pravastatin (PRAVACHOL) 20 MG tablet Take 1 tablet (20 mg total) by mouth daily.   prazosin (MINIPRESS) 2 MG capsule Take 1 capsule (2 mg total) by mouth at bedtime.   temazepam (RESTORIL) 30 MG capsule Take 1 capsule (30 mg total) by mouth at bedtime.    Objective: BP 115/79   Pulse 93   LMP 07/09/2013   Physical Exam:  General: Alert and oriented. and No acute distress. Gait: Normal gait.  Left wrist in a splint that is clean, dry and intact.  Exposed fingers are warm and well-perfused.  She is able to wiggle her fingers.  Sensation is intact to the index finger, as well as the small finger.  IMAGING: I personally reviewed images previously obtained from the ED  X-rays of the left wrist from the emergency department demonstrates a distal radius fracture, with mild volar angulation.   There is some residual angulation based on the postreduction x-rays.   New Medications:  Meds ordered this encounter  Medications   oxyCODONE (ROXICODONE) 5 MG immediate release tablet    Sig: Take 1 tablet (5 mg total) by mouth every 6 (six) hours as needed for up to 7 days.    Dispense:  20 tablet    Refill:  0   ondansetron (ZOFRAN) 4 MG tablet    Sig: Take 1 tablet (4 mg total) by mouth every 8 (eight) hours as needed for up to 14 days for nausea or vomiting.    Dispense:  15 tablet    Refill:  0      Oliver Barre, MD  08/13/2023 9:08 AM

## 2023-08-13 NOTE — Telephone Encounter (Signed)
 Left vm for cb

## 2023-08-13 NOTE — Progress Notes (Signed)
 New Patient Visit  Assessment: [name] is a 63 y.o. female with the following: 1. Closed Colles' fracture of left radius, initial encounter  Plan: [name] fell and sustained a left distal radius fracture.  Based on the nature of the injury, I recommended operative fixation.  She is left-hand-dominant, and will benefit from surgery.  Will plan to proceed with surgery on 08/16/2023.  Risks and benefits have been discussed.  Updated medications have been provided.  Risks and benefits of surgery, including, but not limited to infection, bleeding, persistent pain, damage to surrounding structures, need for further surgery, malunion, nonunion and more severe complications associated with anesthesia were discussed.  All questions have been answered and they have elected to proceed with surgery.    Follow-up: Return for After surgery; DOS 08/16/23.  Subjective:  Chief Complaint  Patient presents with   Fracture    L distal Radius DOI 08/11/23    History of Present Illness: [name] is a 63 y.o. female who presents for evaluation of left wrist pain.  She is left-hand dominant.  She states that she fell backwards, and tried to brace her fall with her left arm.  She had immediate pain.  She presented to the emergency department.  I reduced the left wrist, with improved alignment.  She has remained in the splint.  Pain medications have been helpful, but make her nauseous.   Review of Systems: No fevers or chills No numbness or tingling No chest pain No shortness of breath No bowel or bladder dysfunction No GI distress No headaches   Medical History:  Past Medical History:  Diagnosis Date   Allergy    Anxiety    Blood transfusion without reported diagnosis    Breast cancer (HCC)    Breast cancer (HCC) 04/21/2012   Stage II (T1c N1 M0) grade 3 triple negative breast cancer, right- sided with 1 of 5 positive nodes, status post FEC in a dose dense fashion for 6 cycles  followed by radiation therapy by Dr. Roselind Messier for her high-risk disease with her initial date of surgery in February 2008.    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   COPD (chronic obstructive pulmonary disease) (HCC)    Depression    Emphysema of lung (HCC)    GERD (gastroesophageal reflux disease) 12/18/2012   Headache    Hypercholesteremia    Hyperlipidemia    Neuropathy    Osteoporosis    PONV (postoperative nausea and vomiting)    PTSD (post-traumatic stress disorder)    Substance abuse [facility])     Past Surgical History:  Procedure Laterality Date   BIOPSY  11/05/2017   Procedure: BIOPSY;  Surgeon: West Bali, MD;  [facility];  Service: Endoscopy;;  colon duodenum gastric   BIOPSY  03/04/2023   Procedure: BIOPSY;  Surgeon: Lanelle Bal, DO;  [facility];  Service: Endoscopy;;   BREAST SURGERY     CATARACT EXTRACTION W/PHACO Left 08/15/2017   Procedure: CATARACT EXTRACTION WITH PHACOEMULSIFICATION  AND INTRAOCULAR LENS PLACEMENT LEFT EYE CDE=2.68;  Surgeon: Fabio Pierce, MD;  Location: [facility];  Service: Ophthalmology;  Laterality: Left;  left   CATARACT EXTRACTION W/PHACO Right 09/06/2017   Procedure: CATARACT EXTRACTION PHACO AND INTRAOCULAR LENS PLACEMENT (IOC);  Surgeon: Fabio Pierce, MD;  Location: [facility];  Service: Ophthalmology;  Laterality: Right;  CDE: 1.61   COLONOSCOPY WITH PROPOFOL N/A 11/05/2017    6 mm polyp in the sigmoid colon which was sessile  and removed.  Rectosigmoid colon and sigmoid colon biopsies taken for evaluation for microscopic colitis.  Intermittent rectal bleeding due to sigmoid colon polyp and internal hemorrhoids. Normal colonic biopsies. Tubular adenoma. Repeat surveillance 5-10 years.    COLONOSCOPY WITH PROPOFOL N/A 03/04/2023   Procedure: COLONOSCOPY WITH PROPOFOL;  Surgeon: Lanelle Bal, DO;  [facility];  Service: Endoscopy;  Laterality: N/A;  2:15 pm, asa 3   ESOPHAGOGASTRODUODENOSCOPY   03/10/2008   SLF: Normal esophagus without evidence of Barrett, mass, erosion  ulceration, or stricture, small bowel bx negative, gastritis with NO h.pylori   ESOPHAGOGASTRODUODENOSCOPY (EGD) WITH PROPOFOL N/A 11/05/2017   Mild gastritis and duodenitis.    leocolonoscopy  03/10/2008   ZOX:WRUEAV terminal ileum, approximately 10 cm visualized/Normal colon without evidence of polyps, mass, inflammatory changes, diverticula, or AVMs/Normal retroflexed view of the rectum. random colon bx negative.   MASTECTOMY     bilateral, status post reconstruction.   POLYPECTOMY  11/05/2017   Procedure: POLYPECTOMY;  Surgeon: West Bali, MD;  [facility]: [facility];  Service: Endoscopy;;  colon   TUBAL LIGATION      Family History  Problem Relation Age of Onset   COPD Mother    Depression Mother    Heart disease Mother 31       Stents   Hyperlipidemia Mother    Hypertension Mother    Cancer Mother        lung cancer, to bone   Cancer Father        skin   Heart disease Father 83       Stents, pacemaker   Hypertension Father    Hyperlipidemia Father    Learning disabilities Father    Drug abuse Brother        overdose at 63   Drug abuse Daughter        drug overdose 32   Crohn's disease Maternal Aunt    Cancer Maternal Uncle        lung cancer   Crohn's disease Cousin    Colon cancer Neg Hx    Social History   Tobacco Use   Smoking status: Every Day    Current packs/day: 1.00    Average packs/day: 1 pack/day for 31.3 years (31.3 ttl pk-yrs)    Types: Cigarettes    Start date: 05/07/1992   Smokeless tobacco: Never   Tobacco comments:    discussed  Vaping Use   Vaping status: Never Used  Substance Use Topics   Alcohol use: Not Currently   Drug use: Yes    Types: Marijuana    Allergies  Allergen Reactions   Vicodin [Hydrocodone-Acetaminophen] Nausea Only    Current Meds  Medication Sig   albuterol (VENTOLIN HFA) 108 (90 Base) MCG/ACT inhaler INHALE 2 PUFFS BY MOUTH EVERY 6  HOURS AS NEEDED FOR WHEEZING FOR SHORTNESS OF BREATH   cholecalciferol (VITAMIN D) 1000 units tablet Take 1,000 Units by mouth daily.   citalopram (CELEXA) 20 MG tablet Take 1 tablet (20 mg total) by mouth daily.   diazepam (VALIUM) 10 MG tablet Take 1 tablet (10 mg total) by mouth 3 (three) times daily.   dicyclomine (BENTYL) 10 MG capsule TAKE 1 CAPSULE BY MOUTH THREE TIMES DAILY BEFORE MEAL(S)   fexofenadine (ALLEGRA) 180 MG tablet Take 1 tablet (180 mg total) by mouth daily as needed for allergies or rhinitis.   omeprazole (PRILOSEC) 40 MG capsule Take 1 capsule (40 mg total) by mouth daily.   omeprazole (PRILOSEC) 40  MG capsule Take 1 capsule (40 mg total) by mouth daily.   ondansetron (ZOFRAN) 4 MG tablet Take 1 tablet (4 mg total) by mouth every 8 (eight) hours as needed for up to 14 days for nausea or vomiting.   ondansetron (ZOFRAN-ODT) 4 MG disintegrating tablet Take 1 tablet (4 mg total) by mouth every 8 (eight) hours as needed for nausea or vomiting.   oxyCODONE (ROXICODONE) 5 MG immediate release tablet Take 1 tablet (5 mg total) by mouth every 6 (six) hours as needed for up to 7 days.   pravastatin (PRAVACHOL) 20 MG tablet Take 1 tablet (20 mg total) by mouth daily.   prazosin (MINIPRESS) 2 MG capsule Take 1 capsule (2 mg total) by mouth at bedtime.   temazepam (RESTORIL) 30 MG capsule Take 1 capsule (30 mg total) by mouth at bedtime.    Objective: BP 115/79   Pulse 93   LMP 07/09/2013   Physical Exam:  General: Alert and oriented. and No acute distress. Gait: Normal gait.  Left wrist in a splint that is clean, dry and intact.  Exposed fingers are warm and well-perfused.  She is able to wiggle her fingers.  Sensation is intact to the index finger, as well as the small finger.  IMAGING: I personally reviewed images previously obtained from the ED  X-rays of the left wrist from the emergency department demonstrates a distal radius fracture, with mild volar angulation.   There is some residual angulation based on the postreduction x-rays.   New Medications:  Meds ordered this encounter  Medications   oxyCODONE (ROXICODONE) 5 MG immediate release tablet    Sig: Take 1 tablet (5 mg total) by mouth every 6 (six) hours as needed for up to 7 days.    Dispense:  20 tablet    Refill:  0   ondansetron (ZOFRAN) 4 MG tablet    Sig: Take 1 tablet (4 mg total) by mouth every 8 (eight) hours as needed for up to 14 days for nausea or vomiting.    Dispense:  15 tablet    Refill:  0      Oliver Barre, MD  08/13/2023 9:08 AM

## 2023-08-14 ENCOUNTER — Telehealth: Payer: Self-pay | Admitting: Orthopedic Surgery

## 2023-08-14 NOTE — Telephone Encounter (Signed)
 Dr. Dallas Schimke pt - pt lvm stating she was seen yesterday, is scheduled for surgery, she is having elbow pain and wants to know if this procedure is going to fix this too.  [phone]

## 2023-08-14 NOTE — Patient Instructions (Signed)
 [name]  08/14/2023     @PREFPERIOPPHARMACY @   Your procedure is scheduled on  08/16/2023.   Report to [facility] at  1000 A.M.   Call this number if you have problems the morning of surgery:  [phone]  If you experience any cold or flu symptoms such as cough, fever, chills, shortness of breath, etc. between now and your scheduled surgery, please notify us at the above number.   Remember:        Use your inhaler before you come and bring your rescue inhaler with you.   Do not eat after midnight.    You may drink clear liquids until  0800 am on 08/16/2023.    Clear liquids allowed are:                    Water, Juice (No red color; non-citric and without pulp; diabetics please choose diet or no sugar options), Carbonated beverages (diabetics please choose diet or no sugar options), Clear Tea (No creamer, milk, or cream, including half & half and powdered creamer), Black Coffee Only (No creamer, milk or cream, including half & half and powdered creamer), and Clear Sports drink (No red color; diabetics please choose diet or no sugar options)           At 0800 am on 08/16/2023 drink your carb drink. You can have nothing else to drink after this.    Take these medicines the morning of surgery with A SIP OF WATER       citalopram, diazepam, omeprazole, oxycodone(if needed), zofran (if needed).    Do not wear jewelry, make-up or nail polish, including gel polish,  artificial nails, or any other type of covering on natural nails (fingers and  toes).  Do not wear lotions, powders, or perfumes, or deodorant.  Do not shave 48 hours prior to surgery.  Men may shave face and neck.  Do not bring valuables to the hospital.  [facility] is not responsible for any belongings or valuables.  Contacts, dentures or bridgework may not be worn into surgery.  Leave your suitcase in the car.  After surgery it may be brought to your room.  For patients admitted to the hospital,  discharge time will be determined by your treatment team.  Patients discharged the day of surgery will not be allowed to drive home and must have someone with them for 24 hours.    Special instructions:   DO NOT smoke tobacco or vape for 24 hours before your procedure.  Please read over the following fact sheets that you were given. Pain Booklet, Coughing and Deep Breathing, Surgical Site Infection Prevention, Anesthesia Post-op Instructions, and Care and Recovery After Surgery       ORIF Surgery for a Broken Wrist: What to Know After After an ORIF surgery for a broken wrist, you may have pain, swelling, bruising, and stiffness. You may also see a small amount of fluid or blood in your bandage. ORIF is s a type of surgery that is used to repair a break in a bone and keep the bones stable. Follow these instructions at home:  Medicines Take your medicines only as told. You may need to take steps to help treat or prevent trouble pooping (constipation), such as: Taking medicines to help you poop. Eating foods high in fiber, like beans, whole grains, and fresh fruits and vegetables. Drinking more fluids as told. Ask your provider if it's safe  to drive or use machines while taking your medicine. If you have a splint and compression bandage: Do not remove the splint, fluffy gauze, and bandage. Your provider will usually remove this on your first follow-up visit. If the bandage feels too tight and causes a lot of pain, you can loosen it as told. Rewrap it again so that it feels snug but not painful. Keep the splint and bandage clean and dry. If you have a cast: Do not put pressure on any part of the cast until it's hard. This may take a few hours. Do not stick anything inside it to scratch your skin. Doing this can lead to infection. Check the skin around the cast every day. Tell your provider if you see problems. It's OK to put lotion on dry skin around the cast. Keep the cast clean and  dry. If you have a sling: Wear the sling as told. Take it off only if your provider says you can. Check the skin around it every day. Tell your provider if you see problems. Loosen the sling if your fingers tingle, are numb, or turn cold and blue. Keep the sling clean and dry. Bathing Do not take baths, swim, or use a hot tub until you're told it is OK. Ask if you can shower. If you have a splint and compression bandage, or a cast that is not waterproof: Do not let it get wet. Cover it when you take a bath or shower. Use a cover that doesn't let any water in. Incision care  After your first visit, take care of your cut from surgery as told. Make sure you: Wash your hands with soap and water for at least 20 seconds before and after you change your bandage. If you can't use soap and water, use hand sanitizer. Change your bandage. Leave stitches or skin glue alone. Leave tape strips alone unless you're told to take them off. You may trim the edges of the tape strips if they curl up. Check the area around your cut from surgery every day for signs of infection. Check for: More redness, swelling, or pain. More fluid or blood. Warmth. Pus or a bad smell. Managing pain, stiffness, and swelling  Use ice or an ice pack as told. If you have a sling that you can take off, remove it only as told. Place a towel between your skin and the ice or between your cast and the ice. Leave the ice on for 20 minutes, 2-3 times a day. If your skin turns bright red, take off the ice right away to prevent skin damage. The risk of damage is higher if you can't feel pain, heat, or cold. Move your fingers often to reduce stiffness and swelling. Raise your wrist above the level of your heart while you're sitting or lying down. Use pillows as needed. Activity Rest as told. Ask what things are safe for you to do at home. Ask when you can go back to work or school. Ask your provider when it's safe to drive. Ask if  it's OK for you to lift. Avoid pulling and pushing. Exercise as told. You will work with a physical therapist for a few months. General instructions Do not smoke, vape or use nicotine or tobacco. Keep all follow-up visits. Your provider needs to make sure your wrist is healing correctly. Contact a health care provider if: You have any signs of infection. You have a fever or chills. Your pain is very bad and medicine is  not helping. Your cast is damaged. Your cast feels too tight or too loose. Your arm feels cold or numb. Your skin or fingers on your injured arm turn blue or gray. If you can't reach your provider, go to an urgent care or emergency room. Get help right away if: You have trouble breathing. You feel faint or light-headed. These symptoms may be an emergency. Call 911 right away. Do not wait to see if the symptoms will go away. Do not drive yourself to the hospital. This information is not intended to replace advice given to you by your health care provider. Make sure you discuss any questions you have with your health care provider. Document Revised: 12/17/2022 Document Reviewed: 12/17/2022 Elsevier Patient Education  2024 [employer] How to Use Chlorhexidine Prepackaged Cloths at Home Chlorhexidine gluconate (CHG) is a germ-killing (antiseptic) wash that is used to clean the skin. It can get rid of the germs that normally live on the skin and can keep them away for about 24 hours. One way to clean your skin with CHG is by using prepackaged CHG cloths. The following information gives steps on how to use CHG cloths to clean your skin. Supplies needed: CHG cloths. How to clean your whole body with CHG cloths Follow the order of these steps unless your health care provider gives you other instructions: Use a new CHG cloth for each part of your body and after each step. Wash between any folds of skin, under your breasts, and between your fingers and toes. Front of neck,  shoulders, and chest. Do not get CHG in your eyes, mouth, and ears. If CHG gets into your eyes or ears, rinse them well with water. Both arms, armpits, and hands. Stomach and groin. Right leg and foot. Left leg and foot. Back of neck and back. Do not rinse. Let your skin air dry. Do not put anything on your body afterward, such as powder or perfume. Use lotion only as told by your provider. How to clean your surgery area with CHG cloths Follow these steps when using CHG cloths to clean before surgery unless your provider gives you other instructions: Using the CHG cloths, vigorously wash the part of your body where the surgery will be done. Use a back-and-forth motion for 2 minutes. The skin should be completely wet with CHG when you are done. Do not put anything on your body afterward, such as powder, lotion, or perfume. Put on clean clothes or pajamas. If it is the night before surgery, sleep in clean sheets. General tips Only use CHG cloths as told, and follow the instructions on the label. Use the CHG cloths on clean, dry skin. Do not smoke and stay away from flames after using CHG. Your skin may feel sticky after using the CHG. This is normal. The sticky feeling will go away as the CHG dries. Do not use CHG: If you have a chlorhexidine allergy or have reacted to chlorhexidine in the past. On babies younger than 20 months of age. On your head, face, or genitals unless your provider tells you to. Contact a health care provider if: You have questions about using CHG. Your skin gets irritated or itchy. You have a rash after using CHG. You swallow any CHG. Call your local poison control center [phone] in the [place] Your eyes itch badly, or they become very red or swollen. Your hearing changes. You have trouble seeing. Get help right away if: You have swelling or tingling in your mouth  or throat. You make high-pitched whistling sounds when you breathe, most often when you  breathe out (wheeze). You have trouble breathing. These symptoms may be an emergency. Call 911right away. Do not wait to see if the symptoms will go away. Do not drive yourself to the hospital. This information is not intended to replace advice given to you by your health care provider. Make sure you discuss any questions you have with your health care provider. Document Revised: 11/06/2022 Document Reviewed: 11/02/2021 Elsevier Patient Education  2024 Elsevier Inc.How to Use Chlorhexidine at Home in the Shower Chlorhexidine gluconate (CHG) is a germ-killing (antiseptic) wash that's used to clean the skin. It can get rid of the germs that normally live on the skin and can keep them away for about 24 hours. If you're having surgery, you may be told to shower with CHG at home the night before surgery. This can help lower your risk for infection. To use CHG wash in the shower, follow the steps below. Supplies needed: CHG body wash. Clean washcloth. Clean towel. How to use CHG in the shower Follow these steps unless you're told to use CHG in a different way: Start the shower. Use your normal soap and shampoo to wash your face and hair. Turn off the shower or move out of the shower stream. Pour CHG onto a clean washcloth. Do not use any type of brush or rough sponge. Start at your neck, washing your body down to your toes. Make sure you: Wash the part of your body where the surgery will be done for at least 1 minute. Do not scrub. Do not use CHG on your head or face unless your health care provider tells you to. If it gets into your ears or eyes, rinse them well with water. Do not wash your genitals with CHG. Wash your back and under your arms. Make sure to wash skin folds. Let the CHG sit on your skin for 1-2 minutes or as long as told. Rinse your entire body in the shower, including all body creases and folds. Turn off the shower. Dry off with a clean towel. Do not put anything on your  skin afterward, such as powder, lotion, or perfume. Put on clean clothes or pajamas. If it's the night before surgery, sleep in clean sheets. General tips Use CHG only as told, and follow the instructions on the label. Use the full amount of CHG as told. This is often one bottle. Do not smoke and stay away from flames after using CHG. Your skin may feel sticky after using CHG. This is normal. The sticky feeling will go away as the CHG dries. Do not use CHG: If you have a chlorhexidine allergy or have reacted to chlorhexidine in the past. On open wounds or areas of skin that have broken skin, cuts, or scrapes. On babies younger than 62 months of age. Contact a health care provider if: You have questions about using CHG. Your skin gets irritated or itchy. You have a rash after using CHG. You swallow any CHG. Call your local poison control center [phone] in the [place] Your eyes itch badly, or they become very red or swollen. Your hearing changes. You have trouble seeing. If you can't reach your provider, go to an urgent care or emergency room. Do not drive yourself. Get help right away if: You have swelling or tingling in your mouth or throat. You make high-pitched whistling sounds when you breathe, most often when you breathe out (wheeze).  You have trouble breathing. These symptoms may be an emergency. Call 911 right away. Do not wait to see if the symptoms will go away. Do not drive yourself to the hospital. This information is not intended to replace advice given to you by your health care provider. Make sure you discuss any questions you have with your health care provider. Document Revised: 11/06/2022 Document Reviewed: 11/02/2021 Elsevier Patient Education  2024 Elsevier Inc.How to Use an Incentive Spirometer An incentive spirometer is a tool that measures how well you are filling your lungs with each breath. Learning to take long, deep breaths using this tool can help you  keep your lungs clear and active. This may help to reverse or lessen your chance of developing breathing (pulmonary) problems, especially infection. You may be asked to use a spirometer: After a surgery. If you have a lung problem or a history of smoking. After a long period of time when you have been unable to move or be active. If the spirometer includes an indicator to show the highest number that you have reached, your health care provider or respiratory therapist will help you set a goal. Keep a log of your progress as told by your health care provider. What are the risks? Breathing too quickly may cause dizziness or cause you to pass out. Take your time so you do not get dizzy or light-headed. If you are in pain, you may need to take pain medicine before doing incentive spirometry. It is harder to take a deep breath if you are having pain. How to use your incentive spirometer  Sit up on the edge of your bed or on a chair. Hold the incentive spirometer so that it is in an upright position. Before you use the spirometer, breathe out normally. Place the mouthpiece in your mouth. Make sure your lips are closed tightly around it. Breathe in slowly and as deeply as you can through your mouth, causing the piston or the ball to rise toward the top of the chamber. Hold your breath for 3-5 seconds, or for as long as possible. If the spirometer includes a coach indicator, use this to guide you in breathing. Slow down your breathing if the indicator goes above the marked areas. Remove the mouthpiece from your mouth and breathe out normally. The piston or ball will return to the bottom of the chamber. Rest for a few seconds, then repeat the steps 10 or more times. Take your time and take a few normal breaths between deep breaths so that you do not get dizzy or light-headed. Do this every 1-2 hours when you are awake. If the spirometer includes a goal marker to show the highest number you have reached  (best effort), use this as a goal to work toward during each repetition. After each set of 10 deep breaths, cough a few times. This will help to make sure that your lungs are clear. If you have an incision on your chest or abdomen from surgery, place a pillow or a rolled-up towel firmly against the incision when you cough. This can help to reduce pain while taking deep breaths and coughing. General tips When you are able to get out of bed: Walk around often. Continue to take deep breaths and cough in order to clear your lungs. Keep using the incentive spirometer until your health care provider says it is okay to stop using it. If you have been in the hospital, you may be told to keep using the spirometer  at home. Contact a health care provider if: You are having difficulty using the spirometer. You have trouble using the spirometer as often as instructed. Your pain medicine is not giving enough relief for you to use the spirometer as told. You have a fever. Get help right away if: You develop shortness of breath. You develop a cough with bloody mucus from the lungs. You have fluid or blood coming from an incision site after you cough. Summary An incentive spirometer is a tool that can help you learn to take long, deep breaths to keep your lungs clear and active. You may be asked to use a spirometer after a surgery, if you have a lung problem or a history of smoking, or if you have been inactive for a long period of time. Use your incentive spirometer as instructed every 1-2 hours while you are awake. If you have an incision on your chest or abdomen, place a pillow or a rolled-up towel firmly against your incision when you cough. This will help to reduce pain. Get help right away if you have shortness of breath, you cough up bloody mucus, or blood comes from your incision when you cough. This information is not intended to replace advice given to you by your health care provider. Make sure you  discuss any questions you have with your health care provider. Document Revised: 03/01/2023 Document Reviewed: 03/01/2023 Elsevier Patient Education  2024 [employer].

## 2023-08-15 ENCOUNTER — Encounter (HOSPITAL_COMMUNITY)
Admission: RE | Admit: 2023-08-15 | Discharge: 2023-08-15 | Disposition: A | Discharge status: Home / Self Care | Source: Ambulatory Visit | Attending: Orthopedic Surgery | Admitting: Orthopedic Surgery

## 2023-08-15 ENCOUNTER — Encounter (HOSPITAL_COMMUNITY): Payer: Self-pay

## 2023-08-15 ENCOUNTER — Other Ambulatory Visit: Payer: Self-pay

## 2023-08-15 DIAGNOSIS — Z01812 Encounter for preprocedural laboratory examination: Secondary | ICD-10-CM | POA: Insufficient documentation

## 2023-08-15 NOTE — Telephone Encounter (Signed)
 Left detailed message with information from Dr. Dallas Schimke and call back # incase of more questions.

## 2023-08-16 ENCOUNTER — Ambulatory Visit (HOSPITAL_COMMUNITY): Admitting: Certified Registered Nurse Anesthetist

## 2023-08-16 ENCOUNTER — Encounter (HOSPITAL_COMMUNITY): Payer: Self-pay | Admitting: Orthopedic Surgery

## 2023-08-16 ENCOUNTER — Ambulatory Visit (HOSPITAL_COMMUNITY)

## 2023-08-16 ENCOUNTER — Ambulatory Visit (HOSPITAL_COMMUNITY)
Admission: RE | Admit: 2023-08-16 | Discharge: 2023-08-16 | Disposition: A | Discharge status: Home / Self Care | Attending: Orthopedic Surgery | Admitting: Orthopedic Surgery

## 2023-08-16 ENCOUNTER — Ambulatory Visit (HOSPITAL_BASED_OUTPATIENT_CLINIC_OR_DEPARTMENT_OTHER): Admitting: Certified Registered Nurse Anesthetist

## 2023-08-16 ENCOUNTER — Encounter (HOSPITAL_COMMUNITY)
Admission: RE | Disposition: A | Discharge status: Home / Self Care | Payer: Self-pay | Source: Home / Self Care | Attending: Orthopedic Surgery

## 2023-08-16 DIAGNOSIS — K219 Gastro-esophageal reflux disease without esophagitis: Secondary | ICD-10-CM | POA: Insufficient documentation

## 2023-08-16 DIAGNOSIS — F32A Depression, unspecified: Secondary | ICD-10-CM | POA: Insufficient documentation

## 2023-08-16 DIAGNOSIS — I10 Essential (primary) hypertension: Secondary | ICD-10-CM | POA: Diagnosis not present

## 2023-08-16 DIAGNOSIS — W19XXXA Unspecified fall, initial encounter: Secondary | ICD-10-CM | POA: Diagnosis not present

## 2023-08-16 DIAGNOSIS — J449 Chronic obstructive pulmonary disease, unspecified: Secondary | ICD-10-CM | POA: Diagnosis not present

## 2023-08-16 DIAGNOSIS — F419 Anxiety disorder, unspecified: Secondary | ICD-10-CM | POA: Insufficient documentation

## 2023-08-16 DIAGNOSIS — S52532A Colles' fracture of left radius, initial encounter for closed fracture: Secondary | ICD-10-CM

## 2023-08-16 DIAGNOSIS — S52502A Unspecified fracture of the lower end of left radius, initial encounter for closed fracture: Secondary | ICD-10-CM

## 2023-08-16 DIAGNOSIS — Z9889 Other specified postprocedural states: Secondary | ICD-10-CM | POA: Diagnosis not present

## 2023-08-16 DIAGNOSIS — F17210 Nicotine dependence, cigarettes, uncomplicated: Secondary | ICD-10-CM | POA: Insufficient documentation

## 2023-08-16 HISTORY — PX: OPEN REDUCTION INTERNAL FIXATION (ORIF) DISTAL RADIAL FRACTURE: SHX5989

## 2023-08-16 SURGERY — OPEN REDUCTION INTERNAL FIXATION (ORIF) DISTAL RADIUS FRACTURE
Anesthesia: General | Site: Wrist | Laterality: Left

## 2023-08-16 MED ORDER — MIDAZOLAM HCL 5 MG/5ML IJ SOLN
INTRAMUSCULAR | Status: DC | PRN
Start: 2023-08-16 — End: 2023-08-16
  Administered 2023-08-16: 2 mg via INTRAVENOUS

## 2023-08-16 MED ORDER — FENTANYL CITRATE PF 50 MCG/ML IJ SOSY
25.0000 ug | PREFILLED_SYRINGE | INTRAMUSCULAR | Status: DC | PRN
  Administered 2023-08-16 (×2): 50 ug via INTRAVENOUS
  Filled 2023-08-16 (×2): qty 1

## 2023-08-16 MED ORDER — KETAMINE HCL 50 MG/5ML IJ SOSY
PREFILLED_SYRINGE | INTRAMUSCULAR | Status: AC
  Filled 2023-08-16: qty 5

## 2023-08-16 MED ORDER — SODIUM CHLORIDE 0.9 % IR SOLN
Status: DC | PRN
  Administered 2023-08-16: 1000 mL

## 2023-08-16 MED ORDER — BUPIVACAINE-EPINEPHRINE (PF) 0.5% -1:200000 IJ SOLN
INTRAMUSCULAR | Status: AC
  Filled 2023-08-16: qty 30

## 2023-08-16 MED ORDER — LIDOCAINE HCL (PF) 2 % IJ SOLN
INTRAMUSCULAR | Status: AC
  Filled 2023-08-16: qty 5

## 2023-08-16 MED ORDER — BUPIVACAINE-EPINEPHRINE (PF) 0.5% -1:200000 IJ SOLN
INTRAMUSCULAR | Status: DC | PRN
  Administered 2023-08-16: 10 mL via PERINEURAL

## 2023-08-16 MED ORDER — LACTATED RINGERS IV SOLN: INTRAVENOUS | Status: DC

## 2023-08-16 MED ORDER — KETOROLAC TROMETHAMINE 30 MG/ML IJ SOLN
INTRAMUSCULAR | Status: DC | PRN
Start: 2023-08-16 — End: 2023-08-16
  Administered 2023-08-16: 30 mg via INTRAVENOUS

## 2023-08-16 MED ORDER — OXYCODONE HCL 5 MG/5ML PO SOLN: 5.0000 mg | Freq: Once | ORAL | Status: AC | PRN

## 2023-08-16 MED ORDER — MIDAZOLAM HCL 2 MG/2ML IJ SOLN
INTRAMUSCULAR | Status: AC
  Filled 2023-08-16: qty 2

## 2023-08-16 MED ORDER — CELECOXIB 100 MG PO CAPS: 100.0000 mg | ORAL_CAPSULE | Freq: Every day | ORAL | 0 refills | Status: AC

## 2023-08-16 MED ORDER — ORAL CARE MOUTH RINSE: 15.0000 mL | Freq: Once | OROMUCOSAL | Status: DC

## 2023-08-16 MED ORDER — ONDANSETRON HCL 4 MG/2ML IJ SOLN
INTRAMUSCULAR | Status: AC
Start: 2023-08-16 — End: ?
  Filled 2023-08-16: qty 2

## 2023-08-16 MED ORDER — PHENYLEPHRINE 80 MCG/ML (10ML) SYRINGE FOR IV PUSH (FOR BLOOD PRESSURE SUPPORT)
PREFILLED_SYRINGE | INTRAVENOUS | Status: DC | PRN
  Administered 2023-08-16 (×3): 80 ug via INTRAVENOUS

## 2023-08-16 MED ORDER — ONDANSETRON HCL 4 MG/2ML IJ SOLN
INTRAMUSCULAR | Status: DC | PRN
  Administered 2023-08-16: 4 mg via INTRAVENOUS

## 2023-08-16 MED ORDER — FENTANYL CITRATE (PF) 250 MCG/5ML IJ SOLN
INTRAMUSCULAR | Status: DC | PRN
  Administered 2023-08-16: 50 ug via INTRAVENOUS
  Administered 2023-08-16: 25 ug via INTRAVENOUS
  Administered 2023-08-16: 50 ug via INTRAVENOUS
  Administered 2023-08-16: 25 ug via INTRAVENOUS
  Administered 2023-08-16: 50 ug via INTRAVENOUS

## 2023-08-16 MED ORDER — CEFAZOLIN SODIUM-DEXTROSE 2-4 GM/100ML-% IV SOLN
INTRAVENOUS | Status: AC
  Filled 2023-08-16: qty 100

## 2023-08-16 MED ORDER — KETAMINE HCL 50 MG/5ML IJ SOSY
PREFILLED_SYRINGE | INTRAMUSCULAR | Status: DC | PRN
  Administered 2023-08-16: 20 mg via INTRAVENOUS
  Administered 2023-08-16: 30 mg via INTRAVENOUS

## 2023-08-16 MED ORDER — ONDANSETRON HCL 4 MG/2ML IJ SOLN
4.0000 mg | Freq: Once | INTRAMUSCULAR | Status: AC | PRN
  Administered 2023-08-16: 4 mg via INTRAVENOUS
  Filled 2023-08-16: qty 2

## 2023-08-16 MED ORDER — PROPOFOL 10 MG/ML IV BOLUS
INTRAVENOUS | Status: DC | PRN
  Administered 2023-08-16: 150 mg via INTRAVENOUS

## 2023-08-16 MED ORDER — ONDANSETRON HCL 4 MG PO TABS: 4.0000 mg | ORAL_TABLET | Freq: Three times a day (TID) | ORAL | 0 refills | Status: AC | PRN

## 2023-08-16 MED ORDER — SCOPOLAMINE 1 MG/3DAYS TD PT72
MEDICATED_PATCH | TRANSDERMAL | Status: AC
  Administered 2023-08-16: 1.5 mg
  Filled 2023-08-16: qty 1

## 2023-08-16 MED ORDER — LIDOCAINE HCL (PF) 2 % IJ SOLN
INTRAMUSCULAR | Status: DC | PRN
  Administered 2023-08-16: 100 mg via INTRADERMAL

## 2023-08-16 MED ORDER — DEXAMETHASONE SODIUM PHOSPHATE 10 MG/ML IJ SOLN
INTRAMUSCULAR | Status: DC | PRN
  Administered 2023-08-16: 10 mg via INTRAVENOUS

## 2023-08-16 MED ORDER — CEFAZOLIN SODIUM-DEXTROSE 2-4 GM/100ML-% IV SOLN
2.0000 g | INTRAVENOUS | Status: AC
  Administered 2023-08-16: 2 g via INTRAVENOUS

## 2023-08-16 MED ORDER — ACETAMINOPHEN 500 MG PO TABS
1000.0000 mg | ORAL_TABLET | Freq: Three times a day (TID) | ORAL | 0 refills | Status: AC
Start: 2023-08-16 — End: 2023-08-30

## 2023-08-16 MED ORDER — SCOPOLAMINE 1 MG/3DAYS TD PT72
1.0000 | MEDICATED_PATCH | TRANSDERMAL | Status: DC
  Administered 2023-08-16: 1 via TRANSDERMAL

## 2023-08-16 MED ORDER — CHLORHEXIDINE GLUCONATE 0.12 % MT SOLN
15.0000 mL | Freq: Once | OROMUCOSAL | Status: AC
  Administered 2023-08-16: 15 mL via OROMUCOSAL

## 2023-08-16 MED ORDER — CHLORHEXIDINE GLUCONATE 0.12 % MT SOLN: 15.0000 mL | Freq: Once | OROMUCOSAL | Status: DC

## 2023-08-16 MED ORDER — FENTANYL CITRATE (PF) 250 MCG/5ML IJ SOLN
INTRAMUSCULAR | Status: AC
  Filled 2023-08-16: qty 5

## 2023-08-16 MED ORDER — OXYCODONE HCL 5 MG PO TABS
5.0000 mg | ORAL_TABLET | Freq: Once | ORAL | Status: AC | PRN
  Administered 2023-08-16: 5 mg via ORAL
  Filled 2023-08-16: qty 1

## 2023-08-16 SURGICAL SUPPLY — 47 items
BANDAGE ESMARK 4X12 BL STRL LF (DISPOSABLE) ×1 IMPLANT
BIT DRILL 1.7 (BIT) IMPLANT
BIT DRILL 2.5 CANN REUSE (DRILL) IMPLANT
BLADE SURG 15 STRL LF DISP TIS (BLADE) ×1 IMPLANT
BNDG COHESIVE 4X5 TAN STRL (GAUZE/BANDAGES/DRESSINGS) ×1 IMPLANT
BNDG ELASTIC 3X5.8 VLCR NS LF (GAUZE/BANDAGES/DRESSINGS) ×2 IMPLANT
BNDG ESMARK 4X12 BLUE STRL LF (DISPOSABLE) ×1 IMPLANT
CHLORAPREP W/TINT 26 (MISCELLANEOUS) ×1 IMPLANT
CLOTH BEACON ORANGE TIMEOUT ST (SAFETY) ×1 IMPLANT
COVER LIGHT HANDLE STERIS (MISCELLANEOUS) ×2 IMPLANT
CUFF TOURN SGL QUICK 18X4 (TOURNIQUET CUFF) ×1 IMPLANT
DECANTER SPIKE VIAL GLASS SM (MISCELLANEOUS) IMPLANT
DRAPE C-ARM FOLDED MOBILE STRL (DRAPES) ×1 IMPLANT
ELECT REM PT RETURN 9FT ADLT (ELECTROSURGICAL) ×1 IMPLANT
ELECTRODE REM PT RTRN 9FT ADLT (ELECTROSURGICAL) ×1 IMPLANT
GAUZE SPONGE 4X4 12PLY STRL (GAUZE/BANDAGES/DRESSINGS) IMPLANT
GLOVE BIO SURGEON STRL SZ8 (GLOVE) ×3 IMPLANT
GLOVE BIOGEL PI IND STRL 7.0 (GLOVE) ×2 IMPLANT
GLOVE BIOGEL PI IND STRL 8 (GLOVE) ×1 IMPLANT
GOWN STRL REUS W/TWL LRG LVL3 (GOWN DISPOSABLE) ×2 IMPLANT
GOWN STRL REUS W/TWL XL LVL3 (GOWN DISPOSABLE) ×1 IMPLANT
GUIDEWIRE 1.35MM (WIRE) IMPLANT
KIT TURNOVER KIT A (KITS) ×1 IMPLANT
MANIFOLD NEPTUNE II (INSTRUMENTS) ×1 IMPLANT
NDL HYPO 21X1.5 SAFETY (NEEDLE) IMPLANT
NEEDLE HYPO 21X1.5 SAFETY (NEEDLE) ×1 IMPLANT
NS IRRIG 1000ML POUR BTL (IV SOLUTION) ×1 IMPLANT
PACK BASIC LIMB (CUSTOM PROCEDURE TRAY) ×1 IMPLANT
PAD ARMBOARD POSITIONER FOAM (MISCELLANEOUS) ×1 IMPLANT
PAD CAST 4YDX4 CTTN HI CHSV (CAST SUPPLIES) IMPLANT
PLATE DIST STANDARD LT 3H (Plate) ×1 IMPLANT
PLATE DIST STD LT 3H (Plate) IMPLANT
POSITIONER HEAD 8X9X4 ADT (SOFTGOODS) ×1 IMPLANT
SCREW LOCK COMPR 2.4X16 (Screw) IMPLANT
SCREW LOCKING COMPRESS 2.4X14 (Screw) IMPLANT
SCREW LP 3.5X12MM (Screw) IMPLANT
SCREW LP TI 3.5X14MM (Screw) IMPLANT
SET BASIN LINEN APH (SET/KITS/TRAYS/PACK) ×1 IMPLANT
SLEEVE DRILL 1.7 GUIDE (IRRIGATION / IRRIGATOR) IMPLANT
SPLINT CAST 1 STEP 5X30 WHT (MISCELLANEOUS) IMPLANT
SPONGE LAP 18X18 X RAY DECT (DISPOSABLE) ×1 IMPLANT
STRIP CLOSURE SKIN 1/2X4 (GAUZE/BANDAGES/DRESSINGS) IMPLANT
SUT MNCRL AB 4-0 PS2 18 (SUTURE) IMPLANT
SUT MON AB 2-0 SH27 (SUTURE) ×1 IMPLANT
SUT VIC AB 2-0 CT2 27 (SUTURE) IMPLANT
SYR BULB IRRIG 60ML STRL (SYRINGE) ×1 IMPLANT
WATER STERILE IRR 1000ML POUR (IV SOLUTION) ×1 IMPLANT

## 2023-08-16 NOTE — Anesthesia Procedure Notes (Signed)
 Procedure Name: LMA Insertion Date/Time: 08/16/2023 12:26 PM  Performed by: Lorin Glass, CRNAPre-anesthesia Checklist: Emergency Drugs available, Patient identified, Suction available and Patient being monitored Patient Re-evaluated:Patient Re-evaluated prior to induction Oxygen Delivery Method: Circle system utilized Preoxygenation: Pre-oxygenation with 100% oxygen Induction Type: IV induction LMA: LMA inserted LMA Size: 4.0 Number of attempts: 1 Placement Confirmation: positive ETCO2 and breath sounds checked- equal and bilateral Tube secured with: Tape Dental Injury: Teeth and Oropharynx as per pre-operative assessment

## 2023-08-16 NOTE — Transfer of Care (Signed)
 Immediate Anesthesia Transfer of Care Note  Patient: [name]  Procedure(s) Performed: OPEN REDUCTION INTERNAL FIXATION (ORIF) DISTAL RADIUS FRACTURE (Left: Wrist)  Patient Location: PACU  Anesthesia Type:General  Level of Consciousness: awake  Airway & Oxygen Therapy: Patient Spontanous Breathing and Patient connected to nasal cannula oxygen  Post-op Assessment: Report given to RN and Post -op Vital signs reviewed and stable  Post vital signs: Reviewed and stable  Last Vitals:  Vitals Value Taken Time  BP 153/68 08/16/23 1435  Temp 97.4   Pulse 89 08/16/23 1436  Resp 21 08/16/23 1436  SpO2 98 % 08/16/23 1436  Vitals shown include unfiled device data.  Last Pain:  Vitals:   08/16/23 1033  TempSrc: Oral  PainSc: 0-No pain      Patients Stated Pain Goal: 8 (08/16/23 1033)  Complications: No notable events documented.

## 2023-08-16 NOTE — Discharge Instructions (Signed)
 Cordon Gassett A. Dallas Schimke, MD [facility] [address] [place],  [place]  [address] Phone: [phone] Fax: [phone]   POST-OPERATIVE INSTRUCTIONS - UPPER EXTREMITY   WOUND CARE Please keep splint clean dry and intact until followup.  You may shower on Post-Op Day #2.  You must keep splint dry during this process and may find that a plastic bag taped around the arm or alternatively a towel based bath may be a better option.   If you get your splint wet or if it is damaged please contact our clinic.  EXERCISES Due to your splint being in place you will not be able to bear weight through your extremity.   DO NOT PUT ANY WEIGHT ON YOUR OPERATIVE ARM   REGIONAL ANESTHESIA (NERVE BLOCKS) The anesthesia team may have performed a nerve block for you if safe in the setting of your care.  This is a great tool used to minimize pain.  Typically the block may start wearing off overnight but the long acting medicine may last for 3-4 days.  The nerve block wearing off can be a challenging period but please utilize your as needed pain medications to try and manage this period.    POST-OP MEDICATIONS- Multimodal approach to pain control  In general your pain will be controlled with a combination of substances.  Prescriptions unless otherwise discussed are electronically sent to your pharmacy.  This is a carefully made plan we use to minimize narcotic use.     - Meloxicam OR Celebrex - Anti-inflammatory medication taken on a scheduled basis  - Acetaminophen - Non-narcotic pain medicine taken on a scheduled basis   - Oxycodone - This is a strong narcotic, to be used only on an "as needed" basis for pain.  -  Zofran - take as needed for nausea   FOLLOW-UP If you develop a Fever (>101.5), Redness or Drainage from the surgical incision site, please call our office to arrange for an evaluation. Please call the office to schedule a follow-up appointment for your incision check  if you do not already have one, 10-14 days post-operatively.  IF YOU HAVE ANY QUESTIONS, PLEASE FEEL FREE TO CALL OUR OFFICE.  HELPFUL INFORMATION  If you had a block, it will wear off between 8-24 hrs postop typically.  This is period when your pain may go from nearly zero to the pain you would have had postop without the block.  This is an abrupt transition but nothing dangerous is happening.  You may take an extra dose of narcotic when this happens.  You should wean off your narcotic medicines as soon as you are able.  Most patients will be off or using minimal narcotics before their first postop appointment.   Elevating your leg will help with swelling and pain control.  You are encouraged to elevate your leg as much as possible in the first couple of weeks following surgery.  Imagine a drop of water on your toe, and your goal is to get that water back to your heart.  We suggest you use the pain medication the first night prior to going to bed, in order to ease any pain when the anesthesia wears off. You should avoid taking pain medications on an empty stomach as it will make you nauseous.  Do not drink alcoholic beverages or take illicit drugs when taking pain medications.  In most states it is against the law to drive while you are in a splint or  sling.  And certainly against the law to drive while taking narcotics.  You may return to work/school in the next couple of days when you feel up to it.   Pain medication may make you constipated.  Below are a few solutions to try in this order: Decrease the amount of pain medication if you aren't having pain. Drink lots of decaffeinated fluids. Drink prune juice and/or each dried prunes  If the first 3 don't work start with additional solutions Take Colace - an over-the-counter stool softener Take Senokot - an over-the-counter laxative Take Miralax - a stronger over-the-counter laxative

## 2023-08-16 NOTE — Interval H&P Note (Signed)
 History and Physical Interval Note:  08/16/2023 12:03 PM  [name]  has presented today for surgery, with the diagnosis of Left distal radius fracture.  The various methods of treatment have been discussed with the patient and family. After consideration of risks, benefits and other options for treatment, the patient has consented to  Procedure(s): OPEN REDUCTION INTERNAL FIXATION (ORIF) DISTAL RADIUS FRACTURE (Left) as a surgical intervention.  The patient's history has been reviewed, patient examined, no change in status, stable for surgery.  I have reviewed the patient's chart and labs.  Questions were answered to the patient's satisfaction.     Oliver Barre

## 2023-08-16 NOTE — Op Note (Signed)
 Orthopaedic Surgery Operative Note (CSN: [id number])  [name]  1961/02/22 Date of Surgery: 08/16/2023   Diagnoses:  Left distal radius fracture  Procedure: ORIF of left distal radius fracture   Operative Finding Successful completion of the planned procedure.  Left distal radius fracture without obvious intra-articular extension, stabilized with standard volar plate.   Post-Op Diagnosis: Same Surgeons:Primary: Oliver Barre, MD Assistants: Rebeca Alert Location: [facility] OR ROOM 4 Anesthesia: General with local anesthesia Antibiotics: Ancef 2 g Tourniquet time:  Total Tourniquet Time Documented: Upper Arm (Left) - 96 minutes Total: Upper Arm (Left) - 96 minutes  Estimated Blood Loss: 20 cc Complications: None Specimens: None  Implants: Implant Name Type Inv. Item Serial No. Manufacturer Lot No. LRB No. Used Action  SCREW LP TI 3.5X14MM - [id number] Screw SCREW LP TI 3.5X14MM  [employer] STERILE ON SET FROM SPD Left 2 Implanted  PLATE DIST STANDARD LT 3H - [id number] Plate PLATE DIST STANDARD LT 3H  [employer] STERILE ON SET FROM SPD Left 1 Implanted  SCREW LP 3.5X12MM - [id number] Screw SCREW LP 3.5X12MM  [employer] STERILE ON SET FROM SPD Left 1 Implanted  SCREW LOCKING COMPRESS 2.4X14 - [id number] Screw SCREW LOCKING COMPRESS 2.4X14  [employer] STERILE ON SET FROM SPD Left 3 Implanted  SCREW LOCK COMPR 2.4X16 - [id number] Screw SCREW LOCK COMPR 2.4X16  [employer] STERILE ON SET FROM SPD Left 3 Implanted   Indications for Surgery:   [name] is a 63 y.o. left hand dominant female who fell and sustained a left distal radius fracture.  Fracture was reduced in the emergency department, with improved alignment.  However, she had residual volar angulation, and dorsal tilt of the joint line.  As a result, I recommended operative fixation.  Procedure was discussed in detail.  Benefits and risks of operative and nonoperative management were discussed prior to surgery with  the patient and informed consent form was completed.  Specific risks including infection, need for additional surgery, bleeding, nonunion, malunion, stiffness, persistent pain, damage to surrounding structures and more severe complications associated with anesthesia.  She elected to proceed.  Surgical consent was finalized.   Procedure:   The patient was identified properly. Informed consent was obtained and the surgical site was marked. The patient was taken to the OR where general anesthesia was induced.  The patient was positioned supine on a hand table.  The left arm was prepped and draped in the usual sterile fashion.  Timeout was performed before the beginning of the case.  Tourniquet was used for the above duration.  Patient received 2 g of Ancef prior to making incision.  We made an incision directly overlying the FCR tendon.  We proceeded with a standard volar approach to expose the volar aspect of the distal radius.  We carefully protected the radial artery on the radial forearm and the median nerve in the ulnar aspect of the incision.  The fracture site was identified.  Some callus had formed.  This was debrided with a rongeur and irrigation.  We then fully mobilized to both the distal and proximal fragments.  We completed a reduction maneuver, and noted improved alignment.  This was confirmed under fluoroscopy.  A standard volar distal radius plate was selected from the back table.  With the assistance of fluoroscopy, this was provisionally placed.  We are satisfied with the improved reduction, as well as the positioning of the plate.  We then proceeded to place multiple K wires in  the plate to maintain its alignment.  We placed a single bicortical screw through the oblong hole, which allowed [place] to adjust the distal positioning of the plate.  Once we are satisfied, the screw was locked.  We confirmed that we are satisfied with our overall alignment, as well as the positioning of the plate and  then proceeded to place multiple screws in the distal locking holes.  We placed both screws within the radial styloid.  Like the screws, as well as the positioning was confirmed under fluoroscopy.  Once were satisfied with the distal fixation, we proceeded to place additional screws in the radial shaft, achieving bicortical purchase.  Final fluoroscopic imaging confirmed the overall positioning of the plate, as well as improved alignment of the fracture.  Screws were not too long.  We irrigated the wound copiously.  We closed the incision in a multilayer fashion with absorbable suture.  Sterile dressing was placed followed by a well padded splint.  Patient was awoken taken to PACU in stable condition.   Post-operative plan:  The patient will be discharged home from the PACU when she has recovered. She is to keep the splint clean, dry and intact.  Nonweightbearing on the left upper extremity. DVT prophylaxis not indicated in this ambulatory upper extremity patient without significant risk factors.    Pain control with PRN pain medication preferring oral medicines.   Follow up plan will be scheduled in approximately 10-14 days for incision check and XR.

## 2023-08-16 NOTE — Anesthesia Preprocedure Evaluation (Signed)
 Anesthesia Evaluation  Patient identified by MRN, date of birth, ID band Patient awake    Reviewed: Allergy & Precautions, H&P , NPO status , Patient's Chart, lab work & pertinent test results, reviewed documented beta blocker date and time   History of Anesthesia Complications (+) PONV and history of anesthetic complications  Airway Mallampati: II  TM Distance: >3 FB Neck ROM: full    Dental no notable dental hx.    Pulmonary neg pulmonary ROS, COPD, Current Smoker   Pulmonary exam normal breath sounds clear to auscultation       Cardiovascular Exercise Tolerance: Good hypertension, negative cardio ROS  Rhythm:regular Rate:Normal     Neuro/Psych  Headaches PSYCHIATRIC DISORDERS Anxiety Depression     Neuromuscular disease negative neurological ROS  negative psych ROS   GI/Hepatic negative GI ROS, Neg liver ROS,GERD  ,,  Endo/Other  negative endocrine ROS    Renal/GU negative Renal ROS  negative genitourinary   Musculoskeletal   Abdominal   Peds  Hematology negative hematology ROS (+)   Anesthesia Other Findings   Reproductive/Obstetrics negative OB ROS                             Anesthesia Physical Anesthesia Plan  ASA: 2  Anesthesia Plan: General and General LMA   Post-op Pain Management:    Induction:   PONV Risk Score and Plan: Ondansetron  Airway Management Planned:   Additional Equipment:   Intra-op Plan:   Post-operative Plan:   Informed Consent: I have reviewed the patients History and Physical, chart, labs and discussed the procedure including the risks, benefits and alternatives for the proposed anesthesia with the patient or authorized representative who has indicated his/her understanding and acceptance.     Dental Advisory Given  Plan Discussed with: CRNA  Anesthesia Plan Comments:        Anesthesia Quick Evaluation

## 2023-08-17 ENCOUNTER — Other Ambulatory Visit (HOSPITAL_COMMUNITY): Payer: Self-pay | Admitting: Psychiatry

## 2023-08-19 ENCOUNTER — Encounter (HOSPITAL_COMMUNITY): Payer: Self-pay | Admitting: Orthopedic Surgery

## 2023-08-21 NOTE — Telephone Encounter (Signed)
 Lm on vm to check on pt 4/16 LS

## 2023-08-27 ENCOUNTER — Other Ambulatory Visit (INDEPENDENT_AMBULATORY_CARE_PROVIDER_SITE_OTHER): Payer: Self-pay

## 2023-08-27 ENCOUNTER — Ambulatory Visit (INDEPENDENT_AMBULATORY_CARE_PROVIDER_SITE_OTHER): Admitting: Orthopedic Surgery

## 2023-08-27 ENCOUNTER — Encounter: Payer: Self-pay | Admitting: Orthopedic Surgery

## 2023-08-27 DIAGNOSIS — S52532A Colles' fracture of left radius, initial encounter for closed fracture: Secondary | ICD-10-CM

## 2023-08-27 DIAGNOSIS — S52532D Colles' fracture of left radius, subsequent encounter for closed fracture with routine healing: Secondary | ICD-10-CM

## 2023-08-27 MED ORDER — OXYCODONE HCL 5 MG PO TABS: 5.0000 mg | ORAL_TABLET | Freq: Four times a day (QID) | ORAL | 0 refills | Status: AC | PRN

## 2023-08-27 NOTE — Progress Notes (Signed)
 Orthopaedic Postop Note  Assessment: [name] is a 63 y.o. female s/p ORIF of left distal radius fracture  DOS: 08/16/23  Plan: Sutures trimmed, steri strips applied Procedure was reviewed Refill of oxycodone  provided Elevate to help with swelling Cast for 2 weeks Anticipate transition to a brace at the next visit   Cast application - Left short arm cast   Verbal consent was obtained and the correct extremity was identified. A well padded, appropriately molded short arm cast was applied to the Left arm Fingers remained warm and well perfused.   There were no sharp edges Patient tolerated the procedure well Cast care instructions were provided    Meds ordered this encounter  Medications   oxyCODONE  (ROXICODONE ) 5 MG immediate release tablet    Sig: Take 1 tablet (5 mg total) by mouth every 6 (six) hours as needed for up to 7 days for severe pain (pain score 7-10).    Dispense:  20 tablet    Refill:  0     Follow-up: Return in about 2 weeks (around 09/10/2023). XR at next visit: Left wrist x-ray  Subjective:  Chief Complaint  Patient presents with   Routine Post Op    L wrist DOS 08/16/23    History of Present Illness: [name] is a 63 y.o. female who presents following the above stated procedure.  Surgery was approximately 2 weeks ago.  She has done well.  She continues to have pain.  Tylenol  has been insufficient.  She does have some numbness and tingling to the tips of fingers.  No fevers or chills.  Review of Systems: No fevers or chills + numbness or tingling No Chest Pain No shortness of breath   Objective: LMP 07/09/2013   Physical Exam:  Alert and oriented.  No acute distress.  Evaluation of the left wrist demonstrates some residual bruising and swelling over the volar forearm.  Fingers are warm and well-perfused.  Slightly decreased sensation to the tip of the thumb, as well as the small finger.  Surgical incision is healing.  No  surrounding erythema or drainage.  IMAGING: I personally ordered and reviewed the following images:  X-ray of the left wrist was obtained in clinic today.  Distal radius fracture remains in near-anatomic alignment.  Hardware remains intact.  Screws are not backing out.  No bony lesions.  No subsidence or failure of the implants.  Impression: Left distal radius fracture in stable alignment without hardware failure   Tonita Frater, MD 08/27/2023 4:36 PM

## 2023-08-27 NOTE — Patient Instructions (Signed)

## 2023-08-28 ENCOUNTER — Encounter: Admitting: Orthopedic Surgery

## 2023-08-28 NOTE — Telephone Encounter (Signed)
 Attempted to call pt again closing call

## 2023-08-30 NOTE — Anesthesia Postprocedure Evaluation (Signed)
 Anesthesia Post Note  Patient: [name]  Procedure(s) Performed: OPEN REDUCTION INTERNAL FIXATION (ORIF) DISTAL RADIUS FRACTURE (Left: Wrist)  Patient location during evaluation: Phase II Anesthesia Type: General Level of consciousness: awake Pain management: pain level controlled Vital Signs Assessment: post-procedure vital signs reviewed and stable Respiratory status: spontaneous breathing and respiratory function stable Cardiovascular status: blood pressure returned to baseline and stable Postop Assessment: no headache and no apparent nausea or vomiting Anesthetic complications: no Comments: Late entry   No notable events documented.   Last Vitals:  Vitals:   08/16/23 1600 08/16/23 1605  BP:  (!) 177/88  Pulse: 87 88  Resp: (!) 22 14  Temp:  36.6 C  SpO2: 91% 94%    Last Pain:  Vitals:   08/16/23 1605  TempSrc: Oral  PainSc: 2                  Coretha Dew

## 2023-09-10 ENCOUNTER — Other Ambulatory Visit (INDEPENDENT_AMBULATORY_CARE_PROVIDER_SITE_OTHER): Payer: Self-pay

## 2023-09-10 ENCOUNTER — Ambulatory Visit (INDEPENDENT_AMBULATORY_CARE_PROVIDER_SITE_OTHER): Admitting: Orthopedic Surgery

## 2023-09-10 ENCOUNTER — Encounter: Payer: Self-pay | Admitting: Orthopedic Surgery

## 2023-09-10 DIAGNOSIS — S52532D Colles' fracture of left radius, subsequent encounter for closed fracture with routine healing: Secondary | ICD-10-CM

## 2023-09-10 MED ORDER — OXYCODONE HCL 5 MG PO TABS: 5.0000 mg | ORAL_TABLET | Freq: Four times a day (QID) | ORAL | 0 refills | Status: AC | PRN

## 2023-09-10 NOTE — Patient Instructions (Signed)
 Wear the brace at all times.  Okay to remove for hygiene.  You can start to work on gentle range of motion of the wrist and fingers.

## 2023-09-10 NOTE — Progress Notes (Signed)
 Orthopaedic Postop Note  Assessment: [name] is a 63 y.o. female s/p ORIF of left distal radius fracture  DOS: 08/16/23  Plan: [name] is progressing appropriately.  Repeat radiographs today demonstrates excellent alignment of the fracture.  Hardware remains intact.  Screws are not backing out.  She has worked diligently on range of motion of her fingers.  She is almost able to make a fist.  Surgery was approximately 4 weeks ago.  We will transition her to a brace today.  Okay to work on gentle range of motion.  Can remove the brace for hygiene.  Medications as needed.  Additional refill has been provided.  This will be the last refill.  I would like to see her back in 2 weeks for repeat evaluation.   Follow-up: No follow-ups on file. XR at next visit: Left wrist x-ray  Subjective:  No chief complaint on file.   History of Present Illness: [name] is a 63 y.o. female who presents following the above stated procedure.  Surgery was approximately 4 weeks ago.  She feels better.  Her motion is improving.  She has some residual numbness to the tip of the thumb.  Other numbness has improved.  Swelling is much better.   Review of Systems: No fevers or chills + numbness or tingling No Chest Pain No shortness of breath   Objective: LMP 07/09/2013   Physical Exam:  Alert and oriented.  No acute distress.  Well-healed surgical incision in the volar left wrist.  Minimal swelling.  Incision is clean, dry and intact.  No drainage.  She tolerates to some range of motion of the wrist, with some discomfort.  She is almost able to make a fist.  She is able to get her thumb to her small finger.  IMAGING: I personally ordered and reviewed the following images:  X-rays of the left wrist were obtained in clinic today.  These are compared to available x-rays.  Distal radius fracture remains in good alignment.  Hardware is intact.  Screws are not backing out.  No additional  injuries are noted.  No subsidence of the fracture.  Impression: Healing distal radius fracture without hardware failure   Tonita Frater, MD 09/10/2023 4:05 PM

## 2023-09-18 ENCOUNTER — Telehealth (INDEPENDENT_AMBULATORY_CARE_PROVIDER_SITE_OTHER): Admitting: Psychiatry

## 2023-09-18 ENCOUNTER — Encounter (HOSPITAL_COMMUNITY): Payer: Self-pay | Admitting: Psychiatry

## 2023-09-18 DIAGNOSIS — F411 Generalized anxiety disorder: Secondary | ICD-10-CM

## 2023-09-18 DIAGNOSIS — F332 Major depressive disorder, recurrent severe without psychotic features: Secondary | ICD-10-CM

## 2023-09-18 DIAGNOSIS — F4310 Post-traumatic stress disorder, unspecified: Secondary | ICD-10-CM | POA: Diagnosis not present

## 2023-09-18 MED ORDER — PRAZOSIN HCL 2 MG PO CAPS: 2.0000 mg | ORAL_CAPSULE | Freq: Every day | ORAL | 0 refills | Status: DC

## 2023-09-18 MED ORDER — DIAZEPAM 10 MG PO TABS: 10.0000 mg | ORAL_TABLET | Freq: Three times a day (TID) | ORAL | 2 refills | Status: DC

## 2023-09-18 MED ORDER — CITALOPRAM HYDROBROMIDE 20 MG PO TABS: 20.0000 mg | ORAL_TABLET | Freq: Every day | ORAL | 2 refills | Status: DC

## 2023-09-18 MED ORDER — TEMAZEPAM 30 MG PO CAPS: 30.0000 mg | ORAL_CAPSULE | Freq: Every day | ORAL | 2 refills | Status: DC
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
## 2023-09-24 ENCOUNTER — Encounter: Payer: Self-pay | Admitting: Orthopedic Surgery

## 2023-09-24 ENCOUNTER — Other Ambulatory Visit (INDEPENDENT_AMBULATORY_CARE_PROVIDER_SITE_OTHER)

## 2023-09-24 ENCOUNTER — Ambulatory Visit (INDEPENDENT_AMBULATORY_CARE_PROVIDER_SITE_OTHER): Admitting: Orthopedic Surgery

## 2023-09-24 DIAGNOSIS — S52532D Colles' fracture of left radius, subsequent encounter for closed fracture with routine healing: Secondary | ICD-10-CM

## 2023-09-24 NOTE — Patient Instructions (Signed)
Wrist Fracture Rehab Ask your health care provider which exercises are safe for you. Do exercises exactly as told by your health care provider and adjust them as directed. It is normal to feel mild stretching, pulling, tightness, or discomfort as you do these exercises. Stop right away if you feel sudden pain or your pain gets worse. Do not begin these exercises until told by your health care provider. Stretching and range-of-motion exercises These exercises warm up your muscles and joints and improve the movement and flexibility of your wrist and hand. These exercises also help to relieve pain,numbness, and tingling. Finger flexion and extension Sit or stand with your elbow at your side. Open and stretch your left / right fingers as wide as you can (extension). Hold this position for 10 seconds. Close your left / right fingers into a gentle fist (flexion). Hold this position for 10 seconds. Slowly return to the starting position. Repeat 10 times. Complete this exercise 1-2 times a day. Wrist flexion Bend your left / right elbow to a 90-degree angle (right angle) with your palm facing the floor. Bend your wrist forward so your fingers point toward the floor (flexion). Hold this position for 10 seconds. Slowly return to the starting position. Repeat 10 times. Complete this exercise 1-2 times a day. Wrist extension Bend your left / right elbow to a 90-degree angle (right angle) with your palm facing the floor. Bend your wrist backward so your fingers point toward the ceiling (extension). Hold this position for 10 seconds. Slowly return to the starting position. Repeat 10 times. Complete this exercise 1-2 times a day. Ulnar deviation Bend your left / right elbow to a 90-degree angle (right angle), and rest your forearm on a table with your palm facing down. Keeping your hand flat on the table, bend your left / right wrist toward your small finger (pinkie). This is ulnar deviation. Hold this  position for 10 seconds. Slowly return to the starting position. Repeat 10 times. Complete this exercise 1-2 times a day. Radial deviation Bend your left / right elbow to a 90-degree angle (right angle), and rest your forearm on a table with your palm facing down. Keeping your hand flat on the table, bend your left / right wrist toward your thumb. This is radial deviation. Hold this position for 10 seconds. Slowly return to the starting position. Repeat 10 times. Complete this exercise 1-2 times a day. Forearm rotation, supination Stand or sit with your left / right elbow bent to a 90-degree angle (right angle) at your side. Position your forearm so that the thumb is facing the ceiling (neutral position). Turn (rotate) your palm up toward the ceiling (supination), stopping when you feel a gentle stretch. Hold this position for 10 seconds. Slowly return to the starting position. Repeat 10 times. Complete this exercise 1-2 times a day. Forearm rotation, pronation Stand or sit with your left / right elbow bent to a 90-degree angle (right angle) at your side. Position your forearm so that the thumb is facing the ceiling (neutral position). Turn (rotate) your palm down toward the floor (pronation), stopping when you feel a gentle stretch. Hold this position for 10 seconds. Slowly return to the starting position. Repeat 10 times. Complete this exercise 1-2 times a day. Wrist flexion stretch  Extend your left / right arm in front of you and turn your palm down toward the floor. If told by your health care provider, bend your left / right arm to a 90-degree angle (  right angle) at your side. Using your uninjured hand, gently press over the back of your left / right hand to bend your wrist and fingers toward the floor (flexion). Go as far as you can to feel a stretch without causing pain. Hold this position for 10 seconds. Slowly return to the starting position. Repeat 10 times. Complete this  exercise 1-2 times a day. Wrist extension stretch  Extend your left / right arm in front of you and turn your palm up toward the ceiling. If told by your health care provider, bend your left / right arm to a 90-degree angle (right angle) at your side. Using your uninjured hand, gently press over the palm of your left / right hand to bend your wrist and fingers toward the floor (extension). Go as far as you can to feel a stretch without causing pain. Hold this position for 10 seconds. Slowly return to the starting position. Repeat 10 times. Complete this exercise 1-2 times a day. Forearm rotation stretch, supination Stand or sit with your arms at your sides. Bend your left / right elbow to a 90-degree angle (right angle). Using your uninjured hand, turn your left / right palm up toward the ceiling (assisted supination) until you feel a gentle stretch in the inside of your forearm. Hold this position for 10 seconds. Slowly return to the starting position. Repeat 10 times. Complete this exercise 1-2 times a day. Forearm rotation stretch, pronation Stand or sit with your arms at your sides. Bend your left / right elbow to a 90-degree angle (right angle). Using your uninjured hand, turn your left / right palm down toward the floor (assisted pronation) until you feel a gentle stretch in the top of your forearm. Hold this position for 10 seconds. Slowly return to the starting position. Repeat 10 times. Complete this exercise 1-2 times a day. Strengthening exercises These exercises build strength and endurance in your wrist and hand. Enduranceis the ability to use your muscles for a long time, even after they get tired. Wrist flexion Sit with your left / right forearm supported on a table. Your elbow should be at waist height. Rest your hand over the edge of the table, palm up. Gently grasp a 5 lb / kg weight (can of soup). Or, hold an exercise band or tube in both hands, keeping your hands at  the same level and hip distance apart. There should be slight tension in the exercise band or tube. Without moving your forearm or elbow, slowly bend your wrist up toward the ceiling (wrist flexion). Hold this position for 10 seconds. Slowly return to the starting position. Repeat 10 times. Complete this exercise 1-2 times a day. Wrist extension Sit with your left / right forearm supported on a table. Your elbow should be at waist height. Rest your hand over the edge of the table, palm down. Gently grasp a 5 lb / kg weight. Or, hold an exercise band or tube in both hands, keeping your hands at the same level and hip distance apart. There should be slight tension in the exercise band or tube. Without moving your forearm or elbow, slowly curl your hand up toward the ceiling (extension). Hold this position for 10 seconds. Slowly return to the starting position. Repeat 10 times. Complete this exercise 1-2 times a day. Forearm rotation, supination  Sit with your left / right forearm supported on a table. Your elbow should be at waist height. Rest your hand over the edge of the  table, palm down. Gently grasp a lightweight hammer near the head. As this exercise gets easier for you, try holding the hammer farther down the handle. Without moving your elbow, slowly turn (rotate) your palm up toward the ceiling (supination). Hold this position for 10 seconds. Slowly return to the starting position. Repeat 10 times. Complete this exercise 1-2 times a day. Forearm rotation, pronation  Sit with your left / right forearm supported on a table. Your elbow should be at waist height. Rest your hand over the edge of the table, palm up. Gently grasp a lightweight hammer near the head. As this exercise gets easier for you, try holding the hammer farther down the handle. Without moving your elbow, slowly turn (rotate) your palm down toward the floor (pronation). Hold this position for 10 seconds. Slowly return  to the starting position. Repeat 10 times. Complete this exercise 1-2 times a day. Grip strengthening  Hold one of these items in your left / right hand: a dense sponge, a stress ball, or a large, rolled sock. Slowly squeeze the object as hard as you can without increasing any pain. Hold your squeeze for 10 seconds. Slowly release your grip. Repeat 10 times. Complete this exercise 1-2 times a day. This information is not intended to replace advice given to you by your health care provider. Make sure you discuss any questions you have with your healthcare provider. Document Revised: 09/03/2019 Document Reviewed: 09/03/2019 [employer] Patient Education  2022 [employer]     Hand Exercises  Hand exercises can be helpful for almost anyone. These exercises can strengthen the hands, improve flexibility and movement, and increase blood flow to the hands. These results can make work and daily tasks easier. Hand exercises can be especially helpful for people who have joint pain from arthritis or have nerve damage from overuse (carpal tunnel syndrome). These exercises can also help people who have injured a hand.  Exercises Most of these hand exercises are gentle stretching and motion exercises. It is usually safe to do them often throughout the day. Warming up your hands before exercise may help to reduce stiffness. You can do this with gentle massage or by placing your hands in warm water for 10-15 minutes. It is normal to feel some stretching, pulling, tightness, or mild discomfort as you begin new exercises. This will gradually improve. Stop an exercise right away if you feel sudden, severe pain or your pain gets worse. Ask your health care provider which exercises are best for you. Knuckle bend or "claw" fist Stand or sit with your arm, hand, and all five fingers pointed straight up. Make sure to keep your wrist straight during the exercise. Gently bend your fingers down toward your palm until  the tips of your fingers are touching the top of your palm. Keep your big knuckle straight and just bend the small knuckles in your fingers. Hold this position for 10 seconds. Straighten (extend) your fingers back to the starting position. Repeat this exercise 5-10 times with each hand. Full finger fist Stand or sit with your arm, hand, and all five fingers pointed straight up. Make sure to keep your wrist straight during the exercise. Gently bend your fingers into your palm until the tips of your fingers are touching the middle of your palm. Hold this position for 10 seconds. Extend your fingers back to the starting position, stretching every joint fully. Repeat this exercise 5-10 times with each hand. Straight fist Stand or sit with your arm, hand,  and all five fingers pointed straight up. Make sure to keep your wrist straight during the exercise. Gently bend your fingers at the big knuckle, where your fingers meet your hand, and the middle knuckle. Keep the knuckle at the tips of your fingers straight and try to touch the bottom of your palm. Hold this position for 10 seconds. Extend your fingers back to the starting position, stretching every joint fully. Repeat this exercise 5-10 times with each hand. Tabletop Stand or sit with your arm, hand, and all five fingers pointed straight up. Make sure to keep your wrist straight during the exercise. Gently bend your fingers at the big knuckle, where your fingers meet your hand, as far down as you can while keeping the small knuckles in your fingers straight. Think of forming a tabletop with your fingers. Hold this position for 10 seconds. Extend your fingers back to the starting position, stretching every joint fully. Repeat this exercise 5-10 times with each hand. Finger spread Place your hand flat on a table with your palm facing down. Make sure your wrist stays straight as you do this exercise. Spread your fingers and thumb apart from each  other as far as you can until you feel a gentle stretch. Hold this position for 10 seconds. Bring your fingers and thumb tight together again. Hold this position for 10 seconds. Repeat this exercise 5-10 times with each hand. Making circles Stand or sit with your arm, hand, and all five fingers pointed straight up. Make sure to keep your wrist straight during the exercise. Make a circle by touching the tip of your thumb to the tip of your index finger. Hold for 10 seconds. Then open your hand wide. Repeat this motion with your thumb and each finger on your hand. Repeat this exercise 5-10 times with each hand. Thumb motion Sit with your forearm resting on a table and your wrist straight. Your thumb should be facing up toward the ceiling. Keep your fingers relaxed as you move your thumb. Lift your thumb up as high as you can toward the ceiling. Hold for 10 seconds. Bend your thumb across your palm as far as you can, reaching the tip of your thumb for the small finger (pinkie) side of your palm. Hold for 10 seconds. Repeat this exercise 5-10 times with each hand. Grip strengthening Hold a stress ball or other soft ball in the middle of your hand. Slowly increase the pressure, squeezing the ball as much as you can without causing pain. Think of bringing the tips of your fingers into the middle of your palm. All of your finger joints should bend when doing this exercise. Hold your squeeze for 10 seconds, then relax. Repeat this exercise 5-10 times with each hand.   Contact a health care provider if: Your hand pain or discomfort gets much worse when you do an exercise. Your hand pain or discomfort does not improve within 2 hours after you exercise. If you have any of these problems, stop doing these exercises right away. Do not do them again unless your health care provider says that you can.    Get help right away if: You develop sudden, severe hand pain or swelling. If this happens, stop  doing these exercises right away. Do not do them again unless your health care provider says that you can. This information is not intended to replace advice given to you by your health care provider. Make sure you discuss any questions you have with your  health care provider.

## 2023-09-24 NOTE — Progress Notes (Signed)
 Orthopaedic Postop Note  Assessment: [name] is a 63 y.o. female s/p ORIF of left distal radius fracture  DOS: 08/16/23  Plan: [name] is doing very well.  Radiographs remain stable.  There is evidence of an interval consolidation.  Surgical incision is healing very nicely.  She has limited swelling.  Motion is getting better.  I have urged her to continue working on her range of motion.  Exercises have been provided.  Over the next 2 weeks, she will start transitioning out of the brace.  As soon as 2 weeks from now, she can discontinue use of the brace at all times.  She states her understanding.  If she struggles with motion, we can consider hand therapy.  Otherwise, follow-up in 1 month.   Follow-up: Return in about 4 weeks (around 10/22/2023). XR at next visit: Left wrist x-ray  Subjective:  Chief Complaint  Patient presents with   Routine Post Op    L wrist DOS: 08/16/23    History of Present Illness: [name] is a 63 y.o. female who presents following the above stated procedure.  Surgery was approximately 6 weeks ago.  She is getting better.  She has been working on her motion.  No issues with her incision.  No numbness or tingling.  She is pleased with her progress.  Review of Systems: No fevers or chills No numbness or tingling No Chest Pain No shortness of breath   Objective: LMP 07/09/2013   Physical Exam:  Alert and oriented.  No acute distress.  Evaluation of the left wrist demonstrates a well healed volar incision.  No surrounding erythema or drainage.  No tenderness to palpation.  Sensation intact throughout the left hand.  She continues to have slightly restricted motion of the left thumb.  She is almost able to make a full fist.  Limited extension of the wrist.  She has good flexion at this point.  She is lacking approximately 30 degrees of supination.  IMAGING: I personally ordered and reviewed the following images:  X-rays of the left  wrist were obtained in clinic today.  These are compared to prior x-rays.  Distal radius fracture is in unchanged alignment.  There has been no subsidence.  Screws are not backing out.  Hardware remains intact.  No bony lesions.  Impression: Healing left distal radius fracture, without hardware failure.  Tonita Frater, MD 09/24/2023 4:30 PM

## 2023-10-22 ENCOUNTER — Ambulatory Visit (INDEPENDENT_AMBULATORY_CARE_PROVIDER_SITE_OTHER): Admitting: Orthopedic Surgery

## 2023-10-22 ENCOUNTER — Other Ambulatory Visit (INDEPENDENT_AMBULATORY_CARE_PROVIDER_SITE_OTHER): Payer: Self-pay

## 2023-10-22 DIAGNOSIS — S52532D Colles' fracture of left radius, subsequent encounter for closed fracture with routine healing: Secondary | ICD-10-CM | POA: Diagnosis not present

## 2023-10-22 MED ORDER — PREDNISONE 10 MG (21) PO TBPK: ORAL_TABLET | ORAL | 0 refills | Status: DC

## 2023-10-22 NOTE — Patient Instructions (Signed)
Wrist Fracture Rehab Ask your health care provider which exercises are safe for you. Do exercises exactly as told by your health care provider and adjust them as directed. It is normal to feel mild stretching, pulling, tightness, or discomfort as you do these exercises. Stop right away if you feel sudden pain or your pain gets worse. Do not begin these exercises until told by your health care provider. Stretching and range-of-motion exercises These exercises warm up your muscles and joints and improve the movement and flexibility of your wrist and hand. These exercises also help to relieve pain,numbness, and tingling. Finger flexion and extension Sit or stand with your elbow at your side. Open and stretch your left / right fingers as wide as you can (extension). Hold this position for 10 seconds. Close your left / right fingers into a gentle fist (flexion). Hold this position for 10 seconds. Slowly return to the starting position. Repeat 10 times. Complete this exercise 1-2 times a day. Wrist flexion Bend your left / right elbow to a 90-degree angle (right angle) with your palm facing the floor. Bend your wrist forward so your fingers point toward the floor (flexion). Hold this position for 10 seconds. Slowly return to the starting position. Repeat 10 times. Complete this exercise 1-2 times a day. Wrist extension Bend your left / right elbow to a 90-degree angle (right angle) with your palm facing the floor. Bend your wrist backward so your fingers point toward the ceiling (extension). Hold this position for 10 seconds. Slowly return to the starting position. Repeat 10 times. Complete this exercise 1-2 times a day. Ulnar deviation Bend your left / right elbow to a 90-degree angle (right angle), and rest your forearm on a table with your palm facing down. Keeping your hand flat on the table, bend your left / right wrist toward your small finger (pinkie). This is ulnar deviation. Hold this  position for 10 seconds. Slowly return to the starting position. Repeat 10 times. Complete this exercise 1-2 times a day. Radial deviation Bend your left / right elbow to a 90-degree angle (right angle), and rest your forearm on a table with your palm facing down. Keeping your hand flat on the table, bend your left / right wrist toward your thumb. This is radial deviation. Hold this position for 10 seconds. Slowly return to the starting position. Repeat 10 times. Complete this exercise 1-2 times a day. Forearm rotation, supination Stand or sit with your left / right elbow bent to a 90-degree angle (right angle) at your side. Position your forearm so that the thumb is facing the ceiling (neutral position). Turn (rotate) your palm up toward the ceiling (supination), stopping when you feel a gentle stretch. Hold this position for 10 seconds. Slowly return to the starting position. Repeat 10 times. Complete this exercise 1-2 times a day. Forearm rotation, pronation Stand or sit with your left / right elbow bent to a 90-degree angle (right angle) at your side. Position your forearm so that the thumb is facing the ceiling (neutral position). Turn (rotate) your palm down toward the floor (pronation), stopping when you feel a gentle stretch. Hold this position for 10 seconds. Slowly return to the starting position. Repeat 10 times. Complete this exercise 1-2 times a day. Wrist flexion stretch  Extend your left / right arm in front of you and turn your palm down toward the floor. If told by your health care provider, bend your left / right arm to a 90-degree angle (  right angle) at your side. Using your uninjured hand, gently press over the back of your left / right hand to bend your wrist and fingers toward the floor (flexion). Go as far as you can to feel a stretch without causing pain. Hold this position for 10 seconds. Slowly return to the starting position. Repeat 10 times. Complete this  exercise 1-2 times a day. Wrist extension stretch  Extend your left / right arm in front of you and turn your palm up toward the ceiling. If told by your health care provider, bend your left / right arm to a 90-degree angle (right angle) at your side. Using your uninjured hand, gently press over the palm of your left / right hand to bend your wrist and fingers toward the floor (extension). Go as far as you can to feel a stretch without causing pain. Hold this position for 10 seconds. Slowly return to the starting position. Repeat 10 times. Complete this exercise 1-2 times a day. Forearm rotation stretch, supination Stand or sit with your arms at your sides. Bend your left / right elbow to a 90-degree angle (right angle). Using your uninjured hand, turn your left / right palm up toward the ceiling (assisted supination) until you feel a gentle stretch in the inside of your forearm. Hold this position for 10 seconds. Slowly return to the starting position. Repeat 10 times. Complete this exercise 1-2 times a day. Forearm rotation stretch, pronation Stand or sit with your arms at your sides. Bend your left / right elbow to a 90-degree angle (right angle). Using your uninjured hand, turn your left / right palm down toward the floor (assisted pronation) until you feel a gentle stretch in the top of your forearm. Hold this position for 10 seconds. Slowly return to the starting position. Repeat 10 times. Complete this exercise 1-2 times a day. Strengthening exercises These exercises build strength and endurance in your wrist and hand. Enduranceis the ability to use your muscles for a long time, even after they get tired. Wrist flexion Sit with your left / right forearm supported on a table. Your elbow should be at waist height. Rest your hand over the edge of the table, palm up. Gently grasp a 5 lb / kg weight (can of soup). Or, hold an exercise band or tube in both hands, keeping your hands at  the same level and hip distance apart. There should be slight tension in the exercise band or tube. Without moving your forearm or elbow, slowly bend your wrist up toward the ceiling (wrist flexion). Hold this position for 10 seconds. Slowly return to the starting position. Repeat 10 times. Complete this exercise 1-2 times a day. Wrist extension Sit with your left / right forearm supported on a table. Your elbow should be at waist height. Rest your hand over the edge of the table, palm down. Gently grasp a 5 lb / kg weight. Or, hold an exercise band or tube in both hands, keeping your hands at the same level and hip distance apart. There should be slight tension in the exercise band or tube. Without moving your forearm or elbow, slowly curl your hand up toward the ceiling (extension). Hold this position for 10 seconds. Slowly return to the starting position. Repeat 10 times. Complete this exercise 1-2 times a day. Forearm rotation, supination  Sit with your left / right forearm supported on a table. Your elbow should be at waist height. Rest your hand over the edge of the  table, palm down. Gently grasp a lightweight hammer near the head. As this exercise gets easier for you, try holding the hammer farther down the handle. Without moving your elbow, slowly turn (rotate) your palm up toward the ceiling (supination). Hold this position for 10 seconds. Slowly return to the starting position. Repeat 10 times. Complete this exercise 1-2 times a day. Forearm rotation, pronation  Sit with your left / right forearm supported on a table. Your elbow should be at waist height. Rest your hand over the edge of the table, palm up. Gently grasp a lightweight hammer near the head. As this exercise gets easier for you, try holding the hammer farther down the handle. Without moving your elbow, slowly turn (rotate) your palm down toward the floor (pronation). Hold this position for 10 seconds. Slowly return  to the starting position. Repeat 10 times. Complete this exercise 1-2 times a day. Grip strengthening  Hold one of these items in your left / right hand: a dense sponge, a stress ball, or a large, rolled sock. Slowly squeeze the object as hard as you can without increasing any pain. Hold your squeeze for 10 seconds. Slowly release your grip. Repeat 10 times. Complete this exercise 1-2 times a day. This information is not intended to replace advice given to you by your health care provider. Make sure you discuss any questions you have with your healthcare provider. Document Revised: 09/03/2019 Document Reviewed: 09/03/2019 [employer] Patient Education  [employer].

## 2023-10-22 NOTE — Progress Notes (Signed)
 Orthopaedic Postop Note  Assessment: [name] is a 63 y.o. female s/p ORIF of left distal radius fracture  DOS: 08/16/23  Plan: [name] is progressing.  However, I do think she is a little bit stiff at this time.  We did discuss occupational therapy, but she prefers to continue to work on her motion at home.  I provided her with exercises once again.  I stressed the importance of pushing it to improve her range of motion.  She states understanding.  Radiographs remain stable.  Minimal swelling about the wrist, no concerns about the surgical incision.  I would like see her back in 1 month for repeat evaluation.   Follow-up: Return in about 4 weeks (around 11/19/2023). XR at next visit: Left wrist x-ray  Subjective:  No chief complaint on file.   History of Present Illness: [name] is a 63 y.o. female who presents following the above stated procedure.  Surgery was approximately 10 weeks ago.  She still has restrictions in her motion, but states that she is using her left wrist more and more.  She does have some pain in the volar wrist with flexion.  No issues with her incision.  No numbness or tingling.  She has been doing exercises on her own.  Review of Systems: No fevers or chills No numbness or tingling No Chest Pain No shortness of breath   Objective: LMP 07/09/2013   Physical Exam:  Alert and oriented.  No acute distress.  Left wrist with a well-healed volar incision.  There is no surrounding erythema or drainage.  She has intact sensation throughout the left hand.  Slightly restricted flexion of all fingers.  Not quite able to make a full fist.  Lacking motion in flexion and extension.  IMAGING: I personally ordered and reviewed the following images:  X-rays of the left wrist were obtained in clinic today.  These are compared available x-rays.  Distal radius fracture remains in stable alignment.  Hardware remains intact.  Screws are not backing out.  No  change in overall alignment.  No subsidence.  No lucency.  No bony lesions.  Impression: Healed left distal radius fracture without hardware failure  Tonita Frater, MD 10/23/2023 8:53 AM

## 2023-10-23 ENCOUNTER — Encounter: Payer: Self-pay | Admitting: Orthopedic Surgery

## 2023-11-18 ENCOUNTER — Other Ambulatory Visit: Payer: Self-pay | Admitting: Family Medicine

## 2023-11-18 DIAGNOSIS — K219 Gastro-esophageal reflux disease without esophagitis: Secondary | ICD-10-CM

## 2023-11-19 ENCOUNTER — Ambulatory Visit: Admitting: Orthopedic Surgery

## 2023-11-19 ENCOUNTER — Other Ambulatory Visit (INDEPENDENT_AMBULATORY_CARE_PROVIDER_SITE_OTHER): Payer: Self-pay

## 2023-11-19 DIAGNOSIS — S52532D Colles' fracture of left radius, subsequent encounter for closed fracture with routine healing: Secondary | ICD-10-CM | POA: Diagnosis not present

## 2023-11-19 NOTE — Progress Notes (Unsigned)
 Orthopaedic Postop Note  Assessment: [name] is a 63 y.o. female s/p ORIF of left distal radius fracture  DOS: 08/16/23  Plan: [name] is progressing.  However, I do think she is a little bit stiff at this time.  We did discuss occupational therapy, but she prefers to continue to work on her motion at home.  I provided her with exercises once again.  I stressed the importance of pushing it to improve her range of motion.  She states understanding.  Radiographs remain stable.  Minimal swelling about the wrist, no concerns about the surgical incision.  I would like see her back in 1 month for repeat evaluation.   Follow-up: Return in about 2 months (around 01/20/2024). XR at next visit: Left wrist x-ray  Subjective:  Chief Complaint  Patient presents with   Routine Post Op    L wrist DOS: 08/16/23    History of Present Illness: [name] is a 63 y.o. female who presents following the above stated procedure.  Surgery was approximately 10 weeks ago.  She still has restrictions in her motion, but states that she is using her left wrist more and more.  She does have some pain in the volar wrist with flexion.  No issues with her incision.  No numbness or tingling.  She has been doing exercises on her own.  Review of Systems: No fevers or chills No numbness or tingling No Chest Pain No shortness of breath   Objective: LMP 07/09/2013   Physical Exam:  Alert and oriented.  No acute distress.  Left wrist with a well-healed volar incision.  There is no surrounding erythema or drainage.  She has intact sensation throughout the left hand.  Slightly restricted flexion of all fingers.  Not quite able to make a full fist.  Lacking motion in flexion and extension.  IMAGING: I personally ordered and reviewed the following images:  X-rays of the left wrist were obtained in clinic today.  These are compared available x-rays.  Distal radius fracture remains in stable alignment.   Hardware remains intact.  Screws are not backing out.  No change in overall alignment.  No subsidence.  No lucency.  No bony lesions.  Impression: Healed left distal radius fracture without hardware failure  Oneil DELENA Horde, MD 11/19/2023 1:56 PM

## 2023-11-20 ENCOUNTER — Encounter: Payer: Self-pay | Admitting: Orthopedic Surgery

## 2023-12-15 ENCOUNTER — Other Ambulatory Visit: Payer: Self-pay | Admitting: Gastroenterology

## 2023-12-15 DIAGNOSIS — R197 Diarrhea, unspecified: Secondary | ICD-10-CM

## 2023-12-15 DIAGNOSIS — R109 Unspecified abdominal pain: Secondary | ICD-10-CM

## 2023-12-16 ENCOUNTER — Encounter (HOSPITAL_COMMUNITY): Payer: Self-pay | Admitting: Psychiatry

## 2023-12-16 ENCOUNTER — Telehealth (HOSPITAL_COMMUNITY): Admitting: Psychiatry

## 2023-12-16 DIAGNOSIS — F332 Major depressive disorder, recurrent severe without psychotic features: Secondary | ICD-10-CM | POA: Diagnosis not present

## 2023-12-16 DIAGNOSIS — F411 Generalized anxiety disorder: Secondary | ICD-10-CM | POA: Diagnosis not present

## 2023-12-16 DIAGNOSIS — F4310 Post-traumatic stress disorder, unspecified: Secondary | ICD-10-CM

## 2023-12-16 MED ORDER — PRAZOSIN HCL 2 MG PO CAPS: 2.0000 mg | ORAL_CAPSULE | Freq: Every day | ORAL | 2 refills | Status: DC

## 2023-12-16 MED ORDER — DIAZEPAM 10 MG PO TABS: 10.0000 mg | ORAL_TABLET | Freq: Three times a day (TID) | ORAL | 2 refills | Status: DC

## 2023-12-16 MED ORDER — CITALOPRAM HYDROBROMIDE 20 MG PO TABS: 20.0000 mg | ORAL_TABLET | Freq: Every day | ORAL | 2 refills | Status: DC

## 2023-12-16 MED ORDER — TEMAZEPAM 30 MG PO CAPS: 30.0000 mg | ORAL_CAPSULE | Freq: Every day | ORAL | 2 refills | Status: DC

## 2023-12-16 NOTE — Progress Notes (Signed)
 Virtual Visit via Telephone Note  I connected with [name] on 12/16/23 at  1:40 PM EDT by telephone and verified that I am speaking with the correct person using two identifiers.  Location: Patient: home Provider: office   I discussed the limitations, risks, security and privacy concerns of performing an evaluation and management service by telephone and the availability of in person appointments. I also discussed with the patient that there may be a patient responsible charge related to this service. The patient expressed understanding and agreed to proceed.      I discussed the assessment and treatment plan with the patient. The patient was provided an opportunity to ask questions and all were answered. The patient agreed with the plan and demonstrated an understanding of the instructions.   The patient was advised to call back or seek an in-person evaluation if the symptoms worsen or if the condition fails to improve as anticipated.  I provided 20 minutes of non-face-to-face time during this encounter.   Barnie Gull, MD  [facility] MD/PA/NP OP Progress Note  12/16/2023 1:53 PM [name]  MRN:  [id number]  Chief Complaint:  Chief Complaint  Patient presents with   Anxiety   Depression   Follow-up   HPI: This patient is a 63 year old divorced white female who lives with a boyfriend in [place]. She has 1 grown son and 1 grandson. She had a daughter who died in 2012/02/02 of a narcotic overdose. She is on disability   The patient returns for follow-up after 3 months regarding her depression anxiety and PTSD.  Overall she is doing fairly well.  She states a couple weeks ago she had a really bad day.  She became overwhelmed and very anxious.  She was staying at her sister's house and there were a lot of people there and this made her very uncomfortable.  However lately she has been feeling better and her mood has been stable.  She is sleeping well and not having any nightmares.   The Valium  continues to help her anxiety.  She denies significant depression or thoughts of self-harm Visit Diagnosis:    ICD-10-CM   1. Major depressive disorder, recurrent, severe without psychotic features (HCC)  F33.2     2. Generalized anxiety disorder  F41.1     3. PTSD (post-traumatic stress disorder)  F43.10       Past Psychiatric History: Past outpatient treatment  Past Medical History:  Past Medical History:  Diagnosis Date   Allergy    Anxiety    Blood transfusion without reported diagnosis    Breast cancer (HCC)    Breast cancer (HCC) 04/21/2012   Stage II (T1c N1 M0) grade 3 triple negative breast cancer, right- sided with 1 of 5 positive nodes, status post FEC in a dose dense fashion for 6 cycles followed by radiation therapy by Dr. Shannon for her high-risk disease with her initial date of surgery in February 2008.    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   COPD (chronic obstructive pulmonary disease) (HCC)    Depression    Emphysema of lung (HCC)    GERD (gastroesophageal reflux disease) 12/18/2012   Headache    Hypercholesteremia    Hyperlipidemia    Neuropathy    Osteoporosis    PONV (postoperative nausea and vomiting)    PTSD (post-traumatic stress disorder)    Substance abuse [facility])     Past Surgical History:  Procedure Laterality Date   BIOPSY  11/05/2017   Procedure: BIOPSY;  Surgeon: Harvey Margo CROME, MD;  [facility];  Service: Endoscopy;;  colon duodenum gastric   BIOPSY  03/04/2023   Procedure: BIOPSY;  Surgeon: Cindie Carlin POUR, DO;  [facility];  Service: Endoscopy;;   BREAST SURGERY     CATARACT EXTRACTION W/PHACO Left 08/15/2017   Procedure: CATARACT EXTRACTION WITH PHACOEMULSIFICATION  AND INTRAOCULAR LENS PLACEMENT LEFT EYE CDE=2.68;  Surgeon: Harrie Agent, MD;  Location: [facility] ORS;  Service: Ophthalmology;  Laterality: Left;  left   CATARACT EXTRACTION W/PHACO Right 09/06/2017   Procedure: CATARACT EXTRACTION  PHACO AND INTRAOCULAR LENS PLACEMENT (IOC);  Surgeon: Harrie Agent, MD;  Location: [facility] ORS;  Service: Ophthalmology;  Laterality: Right;  CDE: 1.61   COLONOSCOPY WITH PROPOFOL  N/A 11/05/2017    6 mm polyp in the sigmoid colon which was sessile and removed.  Rectosigmoid colon and sigmoid colon biopsies taken for evaluation for microscopic colitis.  Intermittent rectal bleeding due to sigmoid colon polyp and internal hemorrhoids. Normal colonic biopsies. Tubular adenoma. Repeat surveillance 5-10 years.    COLONOSCOPY WITH PROPOFOL  N/A 03/04/2023   Procedure: COLONOSCOPY WITH PROPOFOL ;  Surgeon: Cindie Carlin POUR, DO;  [facility];  Service: Endoscopy;  Laterality: N/A;  2:15 pm, asa 3   ESOPHAGOGASTRODUODENOSCOPY  03/10/2008   SLF: Normal esophagus without evidence of Barrett, mass, erosion  ulceration, or stricture, small bowel bx negative, gastritis with NO h.pylori   ESOPHAGOGASTRODUODENOSCOPY (EGD) WITH PROPOFOL  N/A 11/05/2017   Mild gastritis and duodenitis.    leocolonoscopy  03/10/2008   DOQ:Wnmfjo terminal ileum, approximately 10 cm visualized/Normal colon without evidence of polyps, mass, inflammatory changes, diverticula, or AVMs/Normal retroflexed view of the rectum. random colon bx negative.   MASTECTOMY     bilateral, status post reconstruction.   OPEN REDUCTION INTERNAL FIXATION (ORIF) DISTAL RADIAL FRACTURE Left 08/16/2023   Procedure: OPEN REDUCTION INTERNAL FIXATION (ORIF) DISTAL RADIUS FRACTURE;  Surgeon: Onesimo Oneil LABOR, MD;  Location: [facility];  Service: Orthopedics;  Laterality: Left;   POLYPECTOMY  11/05/2017   Procedure: POLYPECTOMY;  Surgeon: Harvey Margo CROME, MD;  [facility];  Service: Endoscopy;;  colon   TUBAL LIGATION      Family Psychiatric History: See below  Family History:  Family History  Problem Relation Age of Onset   COPD Mother    Depression Mother    Heart disease Mother 57       Stents   Hyperlipidemia Mother    Hypertension Mother     Cancer Mother        lung cancer, to bone   Cancer Father        skin   Heart disease Father 10       Stents, pacemaker   Hypertension Father    Hyperlipidemia Father    Learning disabilities Father    Drug abuse Brother        overdose at 44   Drug abuse Daughter        drug overdose 32   Crohn's disease Maternal Aunt    Cancer Maternal Uncle        lung cancer   Crohn's disease Cousin    Colon cancer Neg Hx     Social History:  Social History   Socioeconomic History   Marital status: Divorced    Spouse name: Not on file   Number of children: 2   Years of education: 13   Highest education level: Not on file  Occupational History   Occupation: disabled  Comment: neuropathy from chemo  Tobacco Use   Smoking status: Every Day    Current packs/day: 1.00    Average packs/day: 1 pack/day for 31.6 years (31.6 ttl pk-yrs)    Types: Cigarettes    Start date: 05/07/1992   Smokeless tobacco: Never   Tobacco comments:    discussed  Vaping Use   Vaping status: Never Used  Substance and Sexual Activity   Alcohol use: Not Currently   Drug use: Yes    Frequency: 5.0 times per week    Types: Marijuana   Sexual activity: Yes    Birth control/protection: Post-menopausal  Other Topics Concern   Not on file  Social History Narrative   Divorced.  Lives with boyfriend.   One child passed and one still living, lives in [place], reports sees regularly      Social Drivers of Health   Financial Resource Strain: Low Risk  (09/26/2022)   Overall Financial Resource Strain (CARDIA)    Difficulty of Paying Living Expenses: Not hard at all  Food Insecurity: No Food Insecurity (09/26/2022)   Hunger Vital Sign    Worried About Running Out of Food in the Last Year: Never true    Ran Out of Food in the Last Year: Never true  Transportation Needs: No Transportation Needs (09/26/2022)   PRAPARE - Administrator, Civil Service (Medical): No    Lack of Transportation  (Non-Medical): No  Physical Activity: Inactive (09/26/2022)   Exercise Vital Sign    Days of Exercise per Week: 0 days    Minutes of Exercise per Session: 0 min  Stress: No Stress Concern Present (09/26/2022)   [employer] of Occupational Health - Occupational Stress Questionnaire    Feeling of Stress : Not at all  Social Connections: Moderately Isolated (09/26/2022)   Social Connection and Isolation Panel    Frequency of Communication with Friends and Family: More than three times a week    Frequency of Social Gatherings with Friends and Family: Once a week    Attends Religious Services: Never    Database administrator or Organizations: No    Attends Banker Meetings: Never    Marital Status: Living with partner    Allergies:  Allergies  Allergen Reactions   Other Nausea And Vomiting    anesthesia   Percocet [Oxycodone -Acetaminophen ] Nausea Only   Vicodin [Hydrocodone -Acetaminophen ] Nausea Only    Metabolic Disorder Labs: Lab Results  Component Value Date   HGBA1C 5.5 06/03/2023   No results found for: PROLACTIN Lab Results  Component Value Date   CHOL 226 (H) 06/03/2023   TRIG 106 06/03/2023   HDL 57 06/03/2023   CHOLHDL 4.0 06/03/2023   VLDL 42 (H) 11/23/2016   LDLCALC 150 (H) 06/03/2023   LDLCALC 99 12/18/2019   Lab Results  Component Value Date   TSH 2.480 09/21/2022   TSH 3.110 01/17/2021    Therapeutic Level Labs: No results found for: LITHIUM No results found for: VALPROATE No results found for: CBMZ  Current Medications: Current Outpatient Medications  Medication Sig Dispense Refill   albuterol  (VENTOLIN  HFA) 108 (90 Base) MCG/ACT inhaler INHALE 2 PUFFS BY MOUTH EVERY 6 HOURS AS NEEDED FOR WHEEZING FOR SHORTNESS OF BREATH 9 g 2   citalopram  (CELEXA ) 20 MG tablet Take 1 tablet (20 mg total) by mouth daily. 30 tablet 2   diazepam  (VALIUM ) 10 MG tablet Take 1 tablet (10 mg total) by mouth 3 (three) times daily. 90 tablet 2  dicyclomine  (BENTYL ) 10 MG capsule TAKE 1 CAPSULE BY MOUTH THREE TIMES DAILY BEFORE MEAL(S) 90 capsule 1   diphenhydrAMINE (BENADRYL) 25 MG tablet Take 50 mg by mouth daily as needed for allergies.     omeprazole  (PRILOSEC) 40 MG capsule Take 1 capsule by mouth once daily 90 capsule 0   ondansetron  (ZOFRAN -ODT) 4 MG disintegrating tablet Take 1 tablet (4 mg total) by mouth every 8 (eight) hours as needed for nausea or vomiting. (Patient not taking: Reported on 08/13/2023) 20 tablet 0   prazosin  (MINIPRESS ) 2 MG capsule Take 1 capsule (2 mg total) by mouth at bedtime. 30 capsule 2   predniSONE  (STERAPRED UNI-PAK 21 TAB) 10 MG (21) TBPK tablet 10 mg DS 12 as directed 48 tablet 0   temazepam  (RESTORIL ) 30 MG capsule Take 1 capsule (30 mg total) by mouth at bedtime. 30 capsule 2   No current facility-administered medications for this visit.     Musculoskeletal: Strength & Muscle Tone: na Gait & Station: normal Patient leans: N/A  Psychiatric Specialty Exam: Review of Systems  Musculoskeletal:  Positive for arthralgias.  All other systems reviewed and are negative.   Last menstrual period 07/09/2013.There is no height or weight on file to calculate BMI.  General Appearance: NA  Eye Contact:  NA  Speech:  Clear and Coherent  Volume:  Normal  Mood:  Euthymic  Affect:  Congruent  Thought Process:  Goal Directed  Orientation:  Full (Time, Place, and Person)  Thought Content: WDL   Suicidal Thoughts:  No  Homicidal Thoughts:  No  Memory:  Immediate;   Good Recent;   Good Remote;   Fair  Judgement:  Good  Insight:  Good  Psychomotor Activity:  Normal  Concentration:  Concentration: Good and Attention Span: Good  Recall:  Good  Fund of Knowledge: Good  Language: Good  Akathisia:  No  Handed:  Right  AIMS (if indicated): not done  Assets:  Communication Skills Desire for Improvement Resilience Social Support  ADL's:  Intact  Cognition: WNL  Sleep:  Good   Screenings: AUDIT     Flowsheet Row Clinical Support from 04/26/2021 in [facility]  Alcohol Use Disorder Identification Test Final Score (AUDIT) 4   GAD-7    Flowsheet Row Office Visit from 06/03/2023 in [facility] Office Visit from 09/21/2022 in [facility] Office Visit from 01/10/2022 in [facility] Office Visit from 05/31/2021 in [facility] Office Visit from 01/17/2021 in [facility]  Total GAD-7 Score 21 16 19 19 17    PHQ2-9    Flowsheet Row Office Visit from 06/03/2023 in [facility] Clinical Support from 09/26/2022 in [facility] Office Visit from 09/21/2022 in [facility] Office Visit from 01/10/2022 in [facility] Video Visit from 12/06/2021 in [facility] Total Score 6 4 6  0 0  PHQ-9 Total Score 22 11 12 15  --   Flowsheet Row Admission (Discharged) from 08/16/2023 in [facility] from 08[facility]11/2023 in [facility] at [facility] Pre-Admission Testing 60 from 02/28/2023 in [place] [facility]  C-SSRS RISK CATEGORY No Risk No Risk No Risk     Assessment and Plan: This patient is a 63 year old female with a history  of depression anxiety and PTSD.  She continues to do well on her current regimen.  She will continue Celexa  20 mg daily for depression, Valium  10 mg 3 times daily for anxiety, Restoril  30 mg at bedtime for sleep and prazosin  2 mg at bedtime to prevent nightmares.  She will return to see me in 3 months  Collaboration of Care: Collaboration of Care: Primary Care Provider AEB notes to be shared with PCP at patient's request  Patient/Guardian was advised Release  of Information must be obtained prior to any record release in order to collaborate their care with an outside provider. Patient/Guardian was advised if they have not already done so to contact the registration department to sign all necessary forms in order for us  to release information regarding their care.   Consent: Patient/Guardian gives verbal consent for treatment and assignment of benefits for services provided during this visit. Patient/Guardian expressed understanding and agreed to proceed.    Barnie Gull, MD 12/16/2023, 1:53 PM

## 2024-01-21 ENCOUNTER — Ambulatory Visit: Admitting: Orthopedic Surgery

## 2024-01-21 ENCOUNTER — Encounter: Payer: Self-pay | Admitting: Family Medicine

## 2024-01-21 ENCOUNTER — Ambulatory Visit: Payer: No Typology Code available for payment source | Admitting: Family Medicine

## 2024-01-21 ENCOUNTER — Other Ambulatory Visit (HOSPITAL_COMMUNITY)
Admission: RE | Admit: 2024-01-21 | Discharge: 2024-01-21 | Disposition: A | Discharge status: Home / Self Care | Source: Ambulatory Visit | Attending: Family Medicine | Admitting: Family Medicine

## 2024-01-21 VITALS — BP 107/63 | HR 80 | Temp 97.6°F | Ht 64.0 in | Wt 160.2 lb

## 2024-01-21 DIAGNOSIS — Z124 Encounter for screening for malignant neoplasm of cervix: Secondary | ICD-10-CM

## 2024-01-21 DIAGNOSIS — H9193 Unspecified hearing loss, bilateral: Secondary | ICD-10-CM | POA: Diagnosis not present

## 2024-01-21 DIAGNOSIS — Z01419 Encounter for gynecological examination (general) (routine) without abnormal findings: Secondary | ICD-10-CM | POA: Diagnosis present

## 2024-01-21 DIAGNOSIS — Z0001 Encounter for general adult medical examination with abnormal findings: Secondary | ICD-10-CM | POA: Diagnosis not present

## 2024-01-21 DIAGNOSIS — J410 Simple chronic bronchitis: Secondary | ICD-10-CM

## 2024-01-21 DIAGNOSIS — K219 Gastro-esophageal reflux disease without esophagitis: Secondary | ICD-10-CM

## 2024-01-21 DIAGNOSIS — Z1151 Encounter for screening for human papillomavirus (HPV): Secondary | ICD-10-CM | POA: Diagnosis not present

## 2024-01-21 DIAGNOSIS — R7303 Prediabetes: Secondary | ICD-10-CM | POA: Diagnosis not present

## 2024-01-21 DIAGNOSIS — L301 Dyshidrosis [pompholyx]: Secondary | ICD-10-CM

## 2024-01-21 DIAGNOSIS — Z720 Tobacco use: Secondary | ICD-10-CM

## 2024-01-21 DIAGNOSIS — E782 Mixed hyperlipidemia: Secondary | ICD-10-CM

## 2024-01-21 DIAGNOSIS — Z0000 Encounter for general adult medical examination without abnormal findings: Secondary | ICD-10-CM

## 2024-01-21 DIAGNOSIS — L309 Dermatitis, unspecified: Secondary | ICD-10-CM

## 2024-01-21 LAB — BAYER DCA HB A1C WAIVED: HB A1C (BAYER DCA - WAIVED): 5.4 % (ref 4.8–5.6)

## 2024-01-21 MED ORDER — OMEPRAZOLE 40 MG PO CPDR
40.0000 mg | DELAYED_RELEASE_CAPSULE | Freq: Every day | ORAL | 4 refills | Status: AC
Start: 2024-01-21 — End: ?

## 2024-01-21 MED ORDER — EUCRISA 2 % EX OINT: TOPICAL_OINTMENT | CUTANEOUS | 4 refills | Status: AC

## 2024-01-21 MED ORDER — TRIAMCINOLONE ACETONIDE 0.5 % EX OINT
1.0000 | TOPICAL_OINTMENT | Freq: Two times a day (BID) | CUTANEOUS | 5 refills | Status: AC | PRN
Start: 2024-01-21 — End: ?

## 2024-01-21 NOTE — Patient Instructions (Signed)
 Preventive Care 58-61 Years Old, Female  Preventive care refers to lifestyle choices and visits with your health care provider that can promote health and wellness. Preventive care visits are also called wellness exams.  What can I expect for my preventive care visit?  Counseling  Your health care provider may ask you questions about your:  Medical history, including:  Past medical problems.  Family medical history.  Pregnancy history.  Current health, including:  Menstrual cycle.  Method of birth control.  Emotional well-being.  Home life and relationship well-being.  Sexual activity and sexual health.  Lifestyle, including:  Alcohol, nicotine or tobacco, and drug use.  Access to firearms.  Diet, exercise, and sleep habits.  Work and work Astronomer.  Sunscreen use.  Safety issues such as seatbelt and bike helmet use.  Physical exam  Your health care provider will check your:  Height and weight. These may be used to calculate your BMI (body mass index). BMI is a measurement that tells if you are at a healthy weight.  Waist circumference. This measures the distance around your waistline. This measurement also tells if you are at a healthy weight and may help predict your risk of certain diseases, such as type 2 diabetes and high blood pressure.  Heart rate and blood pressure.  Body temperature.  Skin for abnormal spots.  What immunizations do I need?    Vaccines are usually given at various ages, according to a schedule. Your health care provider will recommend vaccines for you based on your age, medical history, and lifestyle or other factors, such as travel or where you work.  What tests do I need?  Screening  Your health care provider may recommend screening tests for certain conditions. This may include:  Lipid and cholesterol levels.  Diabetes screening. This is done by checking your blood sugar (glucose) after you have not eaten for a while (fasting).  Pelvic exam and Pap test.  Hepatitis B test.  Hepatitis C  test.  HIV (human immunodeficiency virus) test.  STI (sexually transmitted infection) testing, if you are at risk.  Lung cancer screening.  Colorectal cancer screening.  Mammogram. Talk with your health care provider about when you should start having regular mammograms. This may depend on whether you have a family history of breast cancer.  BRCA-related cancer screening. This may be done if you have a family history of breast, ovarian, tubal, or peritoneal cancers.  Bone density scan. This is done to screen for osteoporosis.  Talk with your health care provider about your test results, treatment options, and if necessary, the need for more tests.  Follow these instructions at home:  Eating and drinking    Eat a diet that includes fresh fruits and vegetables, whole grains, lean protein, and low-fat dairy products.  Take vitamin and mineral supplements as recommended by your health care provider.  Do not drink alcohol if:  Your health care provider tells you not to drink.  You are pregnant, may be pregnant, or are planning to become pregnant.  If you drink alcohol:  Limit how much you have to 0-1 drink a day.  Know how much alcohol is in your drink. In the [place] one drink equals one 12 oz bottle of beer (355 mL), one 5 oz glass of wine (148 mL), or one 1 oz glass of hard liquor (44 mL).  Lifestyle  Brush your teeth every morning and night with fluoride toothpaste. Floss one time each day.  Exercise for at least  30 minutes 5 or more days each week.  Do not use any products that contain nicotine or tobacco. These products include cigarettes, chewing tobacco, and vaping devices, such as e-cigarettes. If you need help quitting, ask your health care provider.  Do not use drugs.  If you are sexually active, practice safe sex. Use a condom or other form of protection to prevent STIs.  If you do not wish to become pregnant, use a form of birth control. If you plan to become pregnant, see your health care provider for a  prepregnancy visit.  Take aspirin only as told by your health care provider. Make sure that you understand how much to take and what form to take. Work with your health care provider to find out whether it is safe and beneficial for you to take aspirin daily.  Find healthy ways to manage stress, such as:  Meditation, yoga, or listening to music.  Journaling.  Talking to a trusted person.  Spending time with friends and family.  Minimize exposure to UV radiation to reduce your risk of skin cancer.  Safety  Always wear your seat belt while driving or riding in a vehicle.  Do not drive:  If you have been drinking alcohol. Do not ride with someone who has been drinking.  When you are tired or distracted.  While texting.  If you have been using any mind-altering substances or drugs.  Wear a helmet and other protective equipment during sports activities.  If you have firearms in your house, make sure you follow all gun safety procedures.  Seek help if you have been physically or sexually abused.  What's next?  Visit your health care provider once a year for an annual wellness visit.  Ask your health care provider how often you should have your eyes and teeth checked.  Stay up to date on all vaccines.  This information is not intended to replace advice given to you by your health care provider. Make sure you discuss any questions you have with your health care provider.  Document Revised: 10/19/2020 Document Reviewed: 10/19/2020  Elsevier Patient Education  2024 [employer].

## 2024-01-21 NOTE — Progress Notes (Signed)
 [name] is a 63 y.o. female presents to office today for annual physical exam examination.    History of Present Illness  She reports overall she is doing well.  She really does not have any concerns today but notes that she needs a renewal on her PPI.  She takes Prilosec for GERD which is relatively well-controlled.  Gets Bentyl  from her GI doctor.  She sometimes has some issues with dysphagia where she feels like she cannot swallow her pills down all the way despite drinking enough fluid with it.  She denies any rectal bleeding, nausea or vomiting.  No abdominal pain reported.  She continues to follow-up with her psychiatrist as directed and is compliant with all of her medications.  No reports of memory changes or exacerbations in mental health disorders at this time.  She does report eczema between the fingers and on the hands.  It used to be really bad on the palms but it got better after cream that was prescribed by dermatology.  She has been lost to follow-up with dermatology for some time now but has not had any cream for her hands or face for some time now.  She had a separate cream for her face for the eczema but she is not sure what it is.  She reports itching and irritation at the lesions  She also reports some hearing loss per her husband.  She often has to turn up the TV really loud but does not have difficulty with conversations on the telephone.  Marital status: Married, Substance use: Current tobacco use of 1 pack/day.  Denies any wheezing, shortness of breath, hemoptysis, unplanned weight loss, chest pain or change in voice.  Does not desire to stop smoking Health Maintenance Due  Topic Date Due   Zoster Vaccines- Shingrix (2 of 2) 03/16/2021   Medicare Annual Wellness (AWV)  09/26/2023   Influenza Vaccine  12/06/2023   Refills needed today: all  Immunization History  Administered Date(s) Administered   Influenza Whole 03/21/2016   Influenza, Seasonal, Injecte,  Preservative Fre 06/03/2023   Influenza,inj,Quad PF,6+ Mos 02/14/2015, 03/14/2018, 05/11/2020   Pneumococcal Polysaccharide-23 03/14/2018   Tdap 01/19/2021   Zoster Recombinant(Shingrix) 01/19/2021   Past Medical History:  Diagnosis Date   Allergy    Anxiety    Blood transfusion without reported diagnosis    Breast cancer (HCC)    Breast cancer (HCC) 04/21/2012   Stage II (T1c N1 M0) grade 3 triple negative breast cancer, right- sided with 1 of 5 positive nodes, status post FEC in a dose dense fashion for 6 cycles followed by radiation therapy by Dr. Shannon for her high-risk disease with her initial date of surgery in February 2008.    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   COPD (chronic obstructive pulmonary disease) (HCC)    Depression    Emphysema of lung (HCC)    GERD (gastroesophageal reflux disease) 12/18/2012   Headache    Hypercholesteremia    Hyperlipidemia    Neuropathy    Osteoporosis    PONV (postoperative nausea and vomiting)    PTSD (post-traumatic stress disorder)    Substance abuse (HCC)    Social History   Socioeconomic History   Marital status: Divorced    Spouse name: Not on file   Number of children: 2   Years of education: 13   Highest education level: Not on file  Occupational History   Occupation: disabled    Comment: neuropathy from chemo  Tobacco Use  Smoking status: Every Day    Current packs/day: 1.00    Average packs/day: 1 pack/day for 31.7 years (31.7 ttl pk-yrs)    Types: Cigarettes    Start date: 05/07/1992   Smokeless tobacco: Never   Tobacco comments:    discussed  Vaping Use   Vaping status: Never Used  Substance and Sexual Activity   Alcohol use: Not Currently   Drug use: Yes    Frequency: 5.0 times per week    Types: Marijuana   Sexual activity: Yes    Birth control/protection: Post-menopausal  Other Topics Concern   Not on file  Social History Narrative   Divorced.  Lives with boyfriend.   One child passed  and one still living, lives in [place], reports sees regularly      Social Drivers of Health   Financial Resource Strain: Low Risk  (09/26/2022)   Overall Financial Resource Strain (CARDIA)    Difficulty of Paying Living Expenses: Not hard at all  Food Insecurity: No Food Insecurity (09/26/2022)   Hunger Vital Sign    Worried About Running Out of Food in the Last Year: Never true    Ran Out of Food in the Last Year: Never true  Transportation Needs: No Transportation Needs (09/26/2022)   PRAPARE - Administrator, Civil Service (Medical): No    Lack of Transportation (Non-Medical): No  Physical Activity: Inactive (09/26/2022)   Exercise Vital Sign    Days of Exercise per Week: 0 days    Minutes of Exercise per Session: 0 min  Stress: No Stress Concern Present (09/26/2022)   [employer] of Occupational Health - Occupational Stress Questionnaire    Feeling of Stress : Not at all  Social Connections: Moderately Isolated (09/26/2022)   Social Connection and Isolation Panel    Frequency of Communication with Friends and Family: More than three times a week    Frequency of Social Gatherings with Friends and Family: Once a week    Attends Religious Services: Never    Database administrator or Organizations: No    Attends Banker Meetings: Never    Marital Status: Living with partner  Intimate Partner Violence: Not At Risk (09/26/2022)   Humiliation, Afraid, Rape, and Kick questionnaire    Fear of Current or Ex-Partner: No    Emotionally Abused: No    Physically Abused: No    Sexually Abused: No   Past Surgical History:  Procedure Laterality Date   BIOPSY  11/05/2017   Procedure: BIOPSY;  Surgeon: Harvey Margo CROME, MD;  [facility]: [facility];  Service: Endoscopy;;  colon duodenum gastric   BIOPSY  03/04/2023   Procedure: BIOPSY;  Surgeon: Cindie Carlin POUR, DO;  Location: [facility];  Service: Endoscopy;;   BREAST SURGERY     CATARACT EXTRACTION  W/PHACO Left 08/15/2017   Procedure: CATARACT EXTRACTION WITH PHACOEMULSIFICATION  AND INTRAOCULAR LENS PLACEMENT LEFT EYE CDE=2.68;  Surgeon: Harrie Agent, MD;  Location: [facility];  Service: Ophthalmology;  Laterality: Left;  left   CATARACT EXTRACTION W/PHACO Right 09/06/2017   Procedure: CATARACT EXTRACTION PHACO AND INTRAOCULAR LENS PLACEMENT (IOC);  Surgeon: Harrie Agent, MD;  Location: [facility];  Service: Ophthalmology;  Laterality: Right;  CDE: 1.61   COLONOSCOPY WITH PROPOFOL  N/A 11/05/2017    6 mm polyp in the sigmoid colon which was sessile and removed.  Rectosigmoid colon and sigmoid colon biopsies taken for evaluation for microscopic colitis.  Intermittent rectal bleeding due to sigmoid colon polyp  and internal hemorrhoids. Normal colonic biopsies. Tubular adenoma. Repeat surveillance 5-10 years.    COLONOSCOPY WITH PROPOFOL  N/A 03/04/2023   Procedure: COLONOSCOPY WITH PROPOFOL ;  Surgeon: Cindie Carlin POUR, DO;  Location: [facility];  Service: Endoscopy;  Laterality: N/A;  2:15 pm, asa 3   ESOPHAGOGASTRODUODENOSCOPY  03/10/2008   SLF: Normal esophagus without evidence of Barrett, mass, erosion  ulceration, or stricture, small bowel bx negative, gastritis with NO h.pylori   ESOPHAGOGASTRODUODENOSCOPY (EGD) WITH PROPOFOL  N/A 11/05/2017   Mild gastritis and duodenitis.    leocolonoscopy  03/10/2008   DOQ:Wnmfjo terminal ileum, approximately 10 cm visualized/Normal colon without evidence of polyps, mass, inflammatory changes, diverticula, or AVMs/Normal retroflexed view of the rectum. random colon bx negative.   MASTECTOMY     bilateral, status post reconstruction.   OPEN REDUCTION INTERNAL FIXATION (ORIF) DISTAL RADIAL FRACTURE Left 08/16/2023   Procedure: OPEN REDUCTION INTERNAL FIXATION (ORIF) DISTAL RADIUS FRACTURE;  Surgeon: Onesimo Oneil LABOR, MD;  Location: [facility];  Service: Orthopedics;  Laterality: Left;   POLYPECTOMY  11/05/2017   Procedure: POLYPECTOMY;  Surgeon: Harvey Margo CROME, MD;   [facility]: [facility];  Service: Endoscopy;;  colon   TUBAL LIGATION     Family History  Problem Relation Age of Onset   COPD Mother    Depression Mother    Heart disease Mother 66       Stents   Hyperlipidemia Mother    Hypertension Mother    Cancer Mother        lung cancer, to bone   Cancer Father        skin   Heart disease Father 80       Stents, pacemaker   Hypertension Father    Hyperlipidemia Father    Learning disabilities Father    Drug abuse Brother        overdose at 17   Drug abuse Daughter        drug overdose 32   Crohn's disease Maternal Aunt    Cancer Maternal Uncle        lung cancer   Crohn's disease Cousin    Colon cancer Neg Hx     Current Outpatient Medications:    albuterol  (VENTOLIN  HFA) 108 (90 Base) MCG/ACT inhaler, INHALE 2 PUFFS BY MOUTH EVERY 6 HOURS AS NEEDED FOR WHEEZING FOR SHORTNESS OF BREATH, Disp: 9 g, Rfl: 2   citalopram  (CELEXA ) 20 MG tablet, Take 1 tablet (20 mg total) by mouth daily., Disp: 30 tablet, Rfl: 2   Crisaborole  (EUCRISA ) 2 % OINT, Apply small amount to face once daily as needed for eczema of the face., Disp: 60 g, Rfl: 4   diazepam  (VALIUM ) 10 MG tablet, Take 1 tablet (10 mg total) by mouth 3 (three) times daily., Disp: 90 tablet, Rfl: 2   dicyclomine  (BENTYL ) 10 MG capsule, TAKE 1 CAPSULE BY MOUTH THREE TIMES DAILY BEFORE MEAL(S), Disp: 90 capsule, Rfl: 3   diphenhydrAMINE (BENADRYL) 25 MG tablet, Take 50 mg by mouth daily as needed for allergies., Disp: , Rfl:    prazosin  (MINIPRESS ) 2 MG capsule, Take 1 capsule (2 mg total) by mouth at bedtime., Disp: 30 capsule, Rfl: 2   temazepam  (RESTORIL ) 30 MG capsule, Take 1 capsule (30 mg total) by mouth at bedtime., Disp: 30 capsule, Rfl: 2   triamcinolone  ointment (KENALOG ) 0.5 %, Apply 1 Application topically 2 (two) times daily as needed (dyshidrotic eczema of the hands for up to 14 days per flare up)., Disp: 30 g,  Rfl: 5   omeprazole  (PRILOSEC) 40 MG capsule, Take 1 capsule  (40 mg total) by mouth daily., Disp: 90 capsule, Rfl: 4  Allergies  Allergen Reactions   Other Nausea And Vomiting    anesthesia   Percocet [Oxycodone -Acetaminophen ] Nausea Only   Vicodin [Hydrocodone -Acetaminophen ] Nausea Only     ROS: Review of Systems Pertinent items noted in HPI and remainder of comprehensive ROS otherwise negative.    Physical exam BP 107/63   Pulse 80   Temp 97.6 F (36.4 C)   Ht 5' 4 (1.626 m)   Wt 160 lb 4 oz (72.7 kg)   LMP 07/09/2013   SpO2 93%   BMI 27.51 kg/m  General appearance: alert, cooperative, appears stated age, and no distress Head: Normocephalic, without obvious abnormality, atraumatic Eyes: negative findings: lids and lashes normal, conjunctivae and sclerae normal, corneas clear, pupils equal, round, reactive to light and accomodation, and wears glasses Ears: normal TM's and external ear canals both ears Nose: Nares normal. Septum midline. Mucosa normal. No drainage or sinus tenderness. Throat: lips, mucosa, and tongue normal; teeth absent, wears dentures and gums normal Neck: no adenopathy, no carotid bruit, supple, symmetrical, trachea midline, and thyroid  not enlarged, symmetric, no tenderness/mass/nodules Back: symmetric, no curvature. ROM normal. No CVA tenderness. Lungs: Globally decreased breath sounds with no wheezes, rhonchi or rales.  She has normal work of breathing on room air Heart: regular rate and rhythm, S1, S2 normal, no murmur, click, rub or gallop Abdomen: soft, non-tender; bowel sounds normal; no masses,  no organomegaly Extremities: extremities normal, atraumatic, no cyanosis or edema Pulses: 2+ and symmetric Skin: She has dyshidrotic eczema evident between multiple digits bilaterally of the hands.  No overt palmar abnormalities.  She has some dried and mildly flaky with inflammation skin along the cheeks, forehead and scalp line Lymph nodes: Cervical, supraclavicular, and axillary nodes normal. Neurologic: Grossly  normal      06/03/2023    3:20 PM 09/26/2022    9:58 AM 09/21/2022    2:31 PM  Depression screen PHQ 2/9  Decreased Interest 3 3 3   Down, Depressed, Hopeless 3 1 3   PHQ - 2 Score 6 4 6   Altered sleeping 3 0 0  Tired, decreased energy 3 2 0  Change in appetite 3 2 3   Feeling bad or failure about yourself  3 2 3   Trouble concentrating 2 1 0  Moving slowly or fidgety/restless 0 0 0  Suicidal thoughts 2 0 0  PHQ-9 Score 22 11 12   Difficult doing work/chores Somewhat difficult Somewhat difficult Somewhat difficult      06/03/2023    3:20 PM 06/03/2023    3:00 PM 09/21/2022    2:24 PM 01/10/2022    3:58 PM  GAD 7 : Generalized Anxiety Score  Nervous, Anxious, on Edge 3 0 0 3  Control/stop worrying 3  3 3   Worry too much - different things 3  3 3   Trouble relaxing 3  2 2   Restless 3  2 2   Easily annoyed or irritable 3  3 3   Afraid - awful might happen 3  3 3   Total GAD 7 Score 21  16 19   Anxiety Difficulty Somewhat difficult  Somewhat difficult Somewhat difficult     Assessment/ Plan: [name] here for annual physical exam.   Assessment & Plan  Annual physical exam  Screening for malignant neoplasm of cervix - Plan: Cytology - PAP  Facial eczema - Plan: Crisaborole  (EUCRISA ) 2 % OINT  Dyshidrotic eczema - Plan: triamcinolone  ointment (KENALOG ) 0.5 %  Hearing difficulty, bilateral - Plan: Ambulatory referral to Audiology  Gastroesophageal reflux disease without esophagitis - Plan: omeprazole  (PRILOSEC) 40 MG capsule  Prediabetes - Plan: CMP14+EGFR, Bayer DCA Hb A1c Waived  Tobacco abuse - Plan: CMP14+EGFR, CBC with Differential  Simple chronic bronchitis (HCC) - Plan: CMP14+EGFR, CBC with Differential  Mixed hyperlipidemia - Plan: TSH  Pap smear completed.  Will try and get her Eucrisa  for the facial eczema.  She has tried multiple topical corticosteroids and I fear placing her any more potent corticosteroid be more harmful than helpful as she already has  some hypopigmentation that is postinflammatory along the cheeks bilaterally  For the dyshidrotic eczema I placed her on Kenalog  twice daily as needed.  Advised not to use on face  Referral to audiology for hearing loss  GERD stable with PPI this has been renewed  Nonfasting labs collected today.  I counseled her on smoking cessation but she is precontemplative.  We will update her vaccines which were obtained at [facility] recently.  She declined pneumococcal vaccination today  Counseled on healthy lifestyle choices, including diet (rich in fruits, vegetables and lean meats and low in salt and simple carbohydrates) and exercise (at least 30 minutes of moderate physical activity daily).  Patient to follow up 1 year for CPE  Mayara Paulson M. Jolinda, DO

## 2024-01-22 ENCOUNTER — Ambulatory Visit: Admitting: Orthopedic Surgery

## 2024-01-22 ENCOUNTER — Ambulatory Visit: Payer: Self-pay | Admitting: Family Medicine

## 2024-01-22 LAB — CMP14+EGFR
ALT: 12 IU/L (ref 0–32)
AST: 15 IU/L (ref 0–40)
Albumin: 4.5 g/dL (ref 3.9–4.9)
Alkaline Phosphatase: 75 IU/L (ref 49–135)
BUN/Creatinine Ratio: 10 — AB (ref 12–28)
BUN: 7 mg/dL — AB (ref 8–27)
Bilirubin Total: 0.3 mg/dL (ref 0.0–1.2)
CO2: 21 mmol/L (ref 20–29)
Calcium: 9.8 mg/dL (ref 8.7–10.3)
Chloride: 101 mmol/L (ref 96–106)
Creatinine, Ser: 0.69 mg/dL (ref 0.57–1.00)
Globulin, Total: 2.3 g/dL (ref 1.5–4.5)
Glucose: 87 mg/dL (ref 70–99)
Potassium: 4.4 mmol/L (ref 3.5–5.2)
Sodium: 137 mmol/L (ref 134–144)
Total Protein: 6.8 g/dL (ref 6.0–8.5)
eGFR: 99 mL/min/1.73 (ref >59)

## 2024-01-22 LAB — CBC WITH DIFFERENTIAL/PLATELET
Basophils Absolute: 0.1 x10E3/uL (ref 0.0–0.2)
Basos: 1 %
EOS (ABSOLUTE): 0.1 x10E3/uL (ref 0.0–0.4)
Eos: 1 %
Hematocrit: 44.5 % (ref 34.0–46.6)
Hemoglobin: 15.1 g/dL (ref 11.1–15.9)
Immature Grans (Abs): 0 x10E3/uL (ref 0.0–0.1)
Immature Granulocytes: 0 %
Lymphocytes Absolute: 3.1 x10E3/uL (ref 0.7–3.1)
Lymphs: 41 %
MCH: 31.7 pg (ref 26.6–33.0)
MCHC: 33.9 g/dL (ref 31.5–35.7)
MCV: 94 fL (ref 79–97)
Monocytes Absolute: 0.5 x10E3/uL (ref 0.1–0.9)
Monocytes: 6 %
Neutrophils Absolute: 3.9 x10E3/uL (ref 1.4–7.0)
Neutrophils: 51 %
Platelets: 309 x10E3/uL (ref 150–450)
RBC: 4.76 x10E6/uL (ref 3.77–5.28)
RDW: 13 % (ref 11.7–15.4)
WBC: 7.7 x10E3/uL (ref 3.4–10.8)

## 2024-01-22 LAB — TSH: TSH: 2.08 u[IU]/mL (ref 0.450–4.500)

## 2024-01-23 LAB — CYTOLOGY - PAP
Adequacy: ABSENT
Chlamydia: NEGATIVE
Comment: NEGATIVE
Comment: NEGATIVE
Comment: NORMAL
Diagnosis: NEGATIVE
High risk HPV: NEGATIVE
Neisseria Gonorrhea: NEGATIVE

## 2024-03-12 ENCOUNTER — Ambulatory Visit (INDEPENDENT_AMBULATORY_CARE_PROVIDER_SITE_OTHER): Payer: Self-pay

## 2024-03-12 VITALS — BP 107/63 | HR 80 | Ht 64.0 in | Wt 160.0 lb

## 2024-03-12 DIAGNOSIS — Z0000 Encounter for general adult medical examination without abnormal findings: Secondary | ICD-10-CM | POA: Diagnosis not present

## 2024-03-12 NOTE — Progress Notes (Signed)
 Subjective:   [name] is a 63 y.o. female who presents for a Medicare Annual Wellness Visit.  I connected with  [name] on 03/12/24 by a audio enabled telemedicine application and verified that I am speaking with the correct person using two identifiers.  Patient Location: Home  Provider Location: Home Office  I discussed the limitations of evaluation and management by telemedicine. The patient expressed understanding and agreed to proceed.   Allergies (verified) Other, Percocet [oxycodone -acetaminophen ], and Vicodin [hydrocodone -acetaminophen ]   History: Past Medical History:  Diagnosis Date   Allergy    Anxiety    Blood transfusion without reported diagnosis    Breast cancer (HCC)    Breast cancer (HCC) 04/21/2012   Stage II (T1c N1 M0) grade 3 triple negative breast cancer, right- sided with 1 of 5 positive nodes, status post FEC in a dose dense fashion for 6 cycles followed by radiation therapy by Dr. Shannon for her high-risk disease with her initial date of surgery in February 2008.    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   COPD (chronic obstructive pulmonary disease) (HCC)    Depression    Emphysema of lung (HCC)    GERD (gastroesophageal reflux disease) 12/18/2012   Headache    Hypercholesteremia    Hyperlipidemia    Neuropathy    Osteoporosis    PONV (postoperative nausea and vomiting)    PTSD (post-traumatic stress disorder)    Substance abuse [facility])    Past Surgical History:  Procedure Laterality Date   BIOPSY  11/05/2017   Procedure: BIOPSY;  Surgeon: Harvey Margo CROME, MD;  [facility];  Service: Endoscopy;;  colon duodenum gastric   BIOPSY  03/04/2023   Procedure: BIOPSY;  Surgeon: Cindie Carlin POUR, DO;  [facility];  Service: Endoscopy;;   BREAST SURGERY     CATARACT EXTRACTION W/PHACO Left 08/15/2017   Procedure: CATARACT EXTRACTION WITH PHACOEMULSIFICATION  AND INTRAOCULAR LENS PLACEMENT LEFT EYE CDE=2.68;   Surgeon: Harrie Agent, MD;  Location: [facility];  Service: Ophthalmology;  Laterality: Left;  left   CATARACT EXTRACTION W/PHACO Right 09/06/2017   Procedure: CATARACT EXTRACTION PHACO AND INTRAOCULAR LENS PLACEMENT (IOC);  Surgeon: Harrie Agent, MD;  Location: [facility];  Service: Ophthalmology;  Laterality: Right;  CDE: 1.61   COLONOSCOPY WITH PROPOFOL  N/A 11/05/2017    6 mm polyp in the sigmoid colon which was sessile and removed.  Rectosigmoid colon and sigmoid colon biopsies taken for evaluation for microscopic colitis.  Intermittent rectal bleeding due to sigmoid colon polyp and internal hemorrhoids. Normal colonic biopsies. Tubular adenoma. Repeat surveillance 5-10 years.    COLONOSCOPY WITH PROPOFOL  N/A 03/04/2023   Procedure: COLONOSCOPY WITH PROPOFOL ;  Surgeon: Cindie Carlin POUR, DO;  [facility];  Service: Endoscopy;  Laterality: N/A;  2:15 pm, asa 3   ESOPHAGOGASTRODUODENOSCOPY  03/10/2008   SLF: Normal esophagus without evidence of Barrett, mass, erosion  ulceration, or stricture, small bowel bx negative, gastritis with NO h.pylori   ESOPHAGOGASTRODUODENOSCOPY (EGD) WITH PROPOFOL  N/A 11/05/2017   Mild gastritis and duodenitis.    leocolonoscopy  03/10/2008   DOQ:Wnmfjo terminal ileum, approximately 10 cm visualized/Normal colon without evidence of polyps, mass, inflammatory changes, diverticula, or AVMs/Normal retroflexed view of the rectum. random colon bx negative.   MASTECTOMY     bilateral, status post reconstruction.   OPEN REDUCTION INTERNAL FIXATION (ORIF) DISTAL RADIAL FRACTURE Left 08/16/2023   Procedure: OPEN REDUCTION INTERNAL FIXATION (ORIF) DISTAL RADIUS FRACTURE;  Surgeon: Onesimo Oneil LABOR, MD;  Location: [facility];  Service: Orthopedics;  Laterality: Left;   POLYPECTOMY  11/05/2017   Procedure: POLYPECTOMY;  Surgeon: Harvey Margo CROME, MD;  [facility];  Service: Endoscopy;;  colon   TUBAL LIGATION     Family History  Problem Relation Age of Onset   COPD  Mother    Depression Mother    Heart disease Mother 38       Stents   Hyperlipidemia Mother    Hypertension Mother    Cancer Mother        lung cancer, to bone   Cancer Father        skin   Heart disease Father 88       Stents, pacemaker   Hypertension Father    Hyperlipidemia Father    Learning disabilities Father    Drug abuse Brother        overdose at 30   Drug abuse Daughter        drug overdose 32   Crohn's disease Maternal Aunt    Cancer Maternal Uncle        lung cancer   Crohn's disease Cousin    Colon cancer Neg Hx    Social History   Occupational History   Occupation: disabled    Comment: neuropathy from chemo  Tobacco Use   Smoking status: Every Day    Current packs/day: 1.00    Average packs/day: 1 pack/day for 31.8 years (31.8 ttl pk-yrs)    Types: Cigarettes    Start date: 05/07/1992   Smokeless tobacco: Never   Tobacco comments:    discussed  Vaping Use   Vaping status: Never Used  Substance and Sexual Activity   Alcohol use: Not Currently   Drug use: Yes    Frequency: 5.0 times per week    Types: Marijuana   Sexual activity: Yes    Birth control/protection: Post-menopausal   Tobacco Counseling Ready to quit: No Counseling given: Yes Tobacco comments: discussed  SDOH Screenings   Food Insecurity: No Food Insecurity (09/26/2022)  Housing: Unknown (03/12/2024)  Transportation Needs: No Transportation Needs (03/12/2024)  Utilities: Not At Risk (03/12/2024)  Alcohol Screen: Low Risk  (09/26/2022)  Depression (PHQ2-9): High Risk (03/12/2024)  Financial Resource Strain: Low Risk  (09/26/2022)  Physical Activity: Inactive (03/12/2024)  Social Connections: Moderately Isolated (03/12/2024)  Stress: No Stress Concern Present (03/12/2024)  Tobacco Use: High Risk (03/12/2024)  Health Literacy: Adequate Health Literacy (03/12/2024)   Depression Screen    03/12/2024    2:35 PM 01/21/2024    4:32 PM 06/03/2023    3:20 PM 09/26/2022    9:58 AM 09/21/2022     2:31 PM 01/10/2022    3:57 PM 12/06/2021   11:41 AM  PHQ 2/9 Scores  PHQ - 2 Score 6 6 6 4 6  0   PHQ- 9 Score 21 26  22  11  12  15        Information is confidential and restricted. Go to Review Flowsheets to unlock data.   Data saved with a previous flowsheet row definition      Goals Addressed             This Visit's Progress    Quit smoking / using tobacco   On track      Visit info / Clinical Intake: Medicare Wellness Visit Type:: Subsequent Annual Wellness Visit Medicare Wellness Visit Mode:: Telephone If telephone:: video declined If telephone or video:: vitals recorded from last visit Interpreter Needed?: No Pre-visit  prep was completed: yes AWV questionnaire completed by patient prior to visit?: no Living arrangements:: other Patient's Overall Health Status Rating: very good Typical amount of pain: none Does pain affect daily life?: no Are you currently prescribed opioids?: no  Dietary Habits and Nutritional Risks How many meals a day?: (!) 1 Eats fruit and vegetables daily?: yes Most meals are obtained by: preparing own meals Diabetic:: no  Functional Status Activities of Daily Living (to include ambulation/medication): Independent Ambulation: Independent Medication Administration: Independent Home Management: Independent Manage your own finances?: yes Primary transportation is: driving Concerns about hearing?: no  Fall Screening Falls in the past year?: 1 Number of falls in past year: 1 Was there an injury with Fall?: 1 Fall Risk Category Calculator: 3 Patient Fall Risk Level: High Fall Risk  Fall Risk Patient at Risk for Falls Due to: Impaired balance/gait; Impaired mobility Fall risk Follow up: Falls evaluation completed; Education provided  Home and Transportation Safety: All rugs have non-skid backing?: yes All stairs or steps have railings?: yes Grab bars in the bathtub or shower?: (!) no Have non-skid surface in bathtub or shower?:  yes Good home lighting?: yes Regular seat belt use?: yes Hospital stays in the last year:: no  Cognitive Assessment Difficulty concentrating, remembering, or making decisions? : yes Will 6CIT or Mini Cog be Completed: yes What year is it?: 0 points What month is it?: 0 points Give patient an address phrase to remember (5 components): [address] [place], [place] About what time is it?: 0 points Count backwards from 20 to 1: 0 points Say the months of the year in reverse: 4 points Repeat the address phrase from earlier: 6 points 6 CIT Score: 10 points  Advance Directives (For Healthcare) Does Patient Have a Medical Advance Directive?: No Would patient like information on creating a medical advance directive?: -- (pt is aware info can be picked up at pcp's office)  Reviewed/Updated  Reviewed/Updated: All; Medical History; Surgical History; Family History; Medications; Allergies; Care Teams; Patient Goals        Objective:    Today's Vitals   03/12/24 1445  BP: 107/63  Pulse: 80  Weight: 160 lb (72.6 kg)  Height: 5' 4 (1.626 m)   Body mass index is 27.46 kg/m.  Current Medications (verified) Outpatient Encounter Medications as of 03/12/2024  Medication Sig   albuterol  (VENTOLIN  HFA) 108 (90 Base) MCG/ACT inhaler INHALE 2 PUFFS BY MOUTH EVERY 6 HOURS AS NEEDED FOR WHEEZING FOR SHORTNESS OF BREATH   citalopram  (CELEXA ) 20 MG tablet Take 1 tablet (20 mg total) by mouth daily.   Crisaborole  (EUCRISA ) 2 % OINT Apply small amount to face once daily as needed for eczema of the face.   diazepam  (VALIUM ) 10 MG tablet Take 1 tablet (10 mg total) by mouth 3 (three) times daily.   dicyclomine  (BENTYL ) 10 MG capsule TAKE 1 CAPSULE BY MOUTH THREE TIMES DAILY BEFORE MEAL(S)   diphenhydrAMINE (BENADRYL) 25 MG tablet Take 50 mg by mouth daily as needed for allergies.   omeprazole  (PRILOSEC) 40 MG capsule Take 1 capsule (40 mg total) by mouth daily.   prazosin  (MINIPRESS ) 2 MG capsule Take 1  capsule (2 mg total) by mouth at bedtime.   temazepam  (RESTORIL ) 30 MG capsule Take 1 capsule (30 mg total) by mouth at bedtime.   triamcinolone  ointment (KENALOG ) 0.5 % Apply 1 Application topically 2 (two) times daily as needed (dyshidrotic eczema of the hands for up to 14 days per flare up).  No facility-administered encounter medications on file as of 03/12/2024.   Hearing/Vision screen Hearing Screening - Comments:: Pt have hearing dif Vision Screening - Comments:: Pt wear glasse/pt goes [facility] in [place], [place]/last ov 6-57yrs ago Immunizations and Health Maintenance Health Maintenance  Topic Date Due   COVID-19 Vaccine (1) Never done   Zoster Vaccines- Shingrix (2 of 2) 03/16/2021   Influenza Vaccine  12/06/2023   Lung Cancer Screening  06/02/2024 (Originally 04/25/2013)   Pneumococcal Vaccine: 50+ Years (2 of 2 - PCV) 01/20/2025 (Originally 03/15/2019)   Mammogram  06/04/2024   Medicare Annual Wellness (AWV)  03/12/2025   Cervical Cancer Screening (HPV/Pap Cotest)  01/20/2029   DTaP/Tdap/Td (2 - Td or Tdap) 01/20/2031   Colonoscopy  03/03/2033   Hepatitis C Screening  Completed   HIV Screening  Completed   Hepatitis B Vaccines 19-59 Average Risk  Aged Out   HPV VACCINES  Aged Out   Meningococcal B Vaccine  Aged Out        Assessment/Plan:  This is a routine wellness examination for [name].  Patient Care Team: Jolinda Norene HERO, DO as PCP - General (Family Medicine) Shannon Agent, MD as Consulting Physician (Radiation Oncology)  I have personally reviewed and noted the following in the patient's chart:   Medical and social history Use of alcohol, tobacco or illicit drugs  Current medications and supplements including opioid prescriptions. Functional ability and status Nutritional status Physical activity Advanced directives List of other physicians Hospitalizations, surgeries, and ER visits in previous 12 months Vitals Screenings to include cognitive,  depression, and falls Referrals and appointments  No orders of the defined types were placed in this encounter.  In addition, I have reviewed and discussed with patient certain preventive protocols, quality metrics, and best practice recommendations. A written personalized care plan for preventive services as well as general preventive health recommendations were provided to patient.   [name], CMA   03/12/2024   Return in 1 year (on 03/12/2025).  After Visit Summary: (MyChart) Due to this being a telephonic visit, the after visit summary with patients personalized plan was offered to patient via MyChart   Nurse Notes: pt is aware and due the vaccines listed. Phq-score 'high' route msg to pcp

## 2024-03-13 ENCOUNTER — Telehealth: Payer: Self-pay | Admitting: Family Medicine

## 2024-03-13 DIAGNOSIS — Z720 Tobacco use: Secondary | ICD-10-CM

## 2024-03-13 MED ORDER — VARENICLINE TARTRATE 1 MG PO TABS: 1.0000 mg | ORAL_TABLET | Freq: Two times a day (BID) | ORAL | 99 refills | Status: AC

## 2024-03-13 MED ORDER — VARENICLINE TARTRATE (STARTER) 0.5 MG X 11 & 1 MG X 42 PO TBPK: ORAL_TABLET | ORAL | 0 refills | Status: AC

## 2024-03-13 NOTE — Telephone Encounter (Signed)
 sent

## 2024-03-13 NOTE — Telephone Encounter (Signed)
 Copied from CRM #[id number]. Topic: Clinical - Medication Question >> Mar 13, 2024 12:33 PM Nathanel BROCKS wrote: Reason for CRM: pt called and wants to know if Ashly could call her in chantix  so that she can try to quit smoking. Please call pt and advise.

## 2024-03-13 NOTE — Telephone Encounter (Signed)
 Left detailed message making patient aware that PCP sent Chantix  to the pharmacy for her, as requested.

## 2024-03-17 ENCOUNTER — Encounter (HOSPITAL_COMMUNITY): Payer: Self-pay | Admitting: Psychiatry

## 2024-03-17 ENCOUNTER — Telehealth (INDEPENDENT_AMBULATORY_CARE_PROVIDER_SITE_OTHER): Admitting: Psychiatry

## 2024-03-17 DIAGNOSIS — F411 Generalized anxiety disorder: Secondary | ICD-10-CM

## 2024-03-17 DIAGNOSIS — F4310 Post-traumatic stress disorder, unspecified: Secondary | ICD-10-CM

## 2024-03-17 DIAGNOSIS — F332 Major depressive disorder, recurrent severe without psychotic features: Secondary | ICD-10-CM | POA: Diagnosis not present

## 2024-03-17 MED ORDER — TEMAZEPAM 30 MG PO CAPS: 30.0000 mg | ORAL_CAPSULE | Freq: Every day | ORAL | 2 refills | Status: AC

## 2024-03-17 MED ORDER — PRAZOSIN HCL 2 MG PO CAPS: 2.0000 mg | ORAL_CAPSULE | Freq: Every day | ORAL | 2 refills | Status: AC

## 2024-03-17 MED ORDER — DIAZEPAM 10 MG PO TABS: 10.0000 mg | ORAL_TABLET | Freq: Three times a day (TID) | ORAL | 2 refills | Status: AC

## 2024-03-17 MED ORDER — CITALOPRAM HYDROBROMIDE 20 MG PO TABS: 20.0000 mg | ORAL_TABLET | Freq: Every day | ORAL | 2 refills | Status: AC

## 2024-03-17 NOTE — Progress Notes (Signed)
 Virtual Visit via Telephone Note  I connected with [name] on 03/17/24 at  1:20 PM EST by telephone and verified that I am speaking with the correct person using two identifiers.  Location: Patient: home Provider: office   I discussed the limitations, risks, security and privacy concerns of performing an evaluation and management service by telephone and the availability of in person appointments. I also discussed with the patient that there may be a patient responsible charge related to this service. The patient expressed understanding and agreed to proceed.       I discussed the assessment and treatment plan with the patient. The patient was provided an opportunity to ask questions and all were answered. The patient agreed with the plan and demonstrated an understanding of the instructions.   The patient was advised to call back or seek an in-person evaluation if the symptoms worsen or if the condition fails to improve as anticipated.  I provided 20 minutes of non-face-to-face time during this encounter.   Barnie Gull, MD  [facility] MD/PA/NP OP Progress Note  03/17/2024 1:43 PM [name]  MRN:  [id number]  Chief Complaint:  Chief Complaint  Patient presents with   Anxiety   Depression   Follow-up   This patient is a 63 year old divorced white female who lives with a boyfriend in [place]. She has 1 grown son and 1 grandson. She had a daughter who died in 04/04/12 of a narcotic overdose. She is on disability   The patient returns for follow-up after 3 months regarding her major depressive disorder, generalized anxiety disorder and posttraumatic stress disorder.  She states she is doing okay.  However she has had conflicts with her son at times.  She states that her son's girlfriend is very controlling and does not want her son to have a lot of contact with her.  She also worries about how the sons girlfriend is treating her grandson that the son had with another woman.  She  knows however that if she interferes too much it will just cause more conflict between the patient and her son.  The patient states that the medications for depression seem to be helping as well as the ones for anxiety.  She is sleeping well as long as she has her medication and she is not having nightmares HPI:  Visit Diagnosis:    ICD-10-CM   1. Major depressive disorder, recurrent, severe without psychotic features (HCC)  F33.2     2. Generalized anxiety disorder  F41.1     3. PTSD (post-traumatic stress disorder)  F43.10       Past Psychiatric History: Past outpatient treatment  Past Medical History:  Past Medical History:  Diagnosis Date   Allergy    Anxiety    Blood transfusion without reported diagnosis    Breast cancer (HCC)    Breast cancer (HCC) 04/21/2012   Stage II (T1c N1 M0) grade 3 triple negative breast cancer, right- sided with 1 of 5 positive nodes, status post FEC in a dose dense fashion for 6 cycles followed by radiation therapy by Dr. Shannon for her high-risk disease with her initial date of surgery in February 2008.    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   COPD (chronic obstructive pulmonary disease) (HCC)    Depression    Emphysema of lung (HCC)    GERD (gastroesophageal reflux disease) 12/18/2012   Headache    Hypercholesteremia    Hyperlipidemia    Neuropathy    Osteoporosis  PONV (postoperative nausea and vomiting)    PTSD (post-traumatic stress disorder)    Substance abuse [facility])     Past Surgical History:  Procedure Laterality Date   BIOPSY  11/05/2017   Procedure: BIOPSY;  Surgeon: Harvey Margo CROME, MD;  [facility];  Service: Endoscopy;;  colon duodenum gastric   BIOPSY  03/04/2023   Procedure: BIOPSY;  Surgeon: Cindie Carlin POUR, DO;  [facility];  Service: Endoscopy;;   BREAST SURGERY     CATARACT EXTRACTION W/PHACO Left 08/15/2017   Procedure: CATARACT EXTRACTION WITH PHACOEMULSIFICATION  AND INTRAOCULAR  LENS PLACEMENT LEFT EYE CDE=2.68;  Surgeon: Harrie Agent, MD;  Location: [facility];  Service: Ophthalmology;  Laterality: Left;  left   CATARACT EXTRACTION W/PHACO Right 09/06/2017   Procedure: CATARACT EXTRACTION PHACO AND INTRAOCULAR LENS PLACEMENT (IOC);  Surgeon: Harrie Agent, MD;  Location: [facility];  Service: Ophthalmology;  Laterality: Right;  CDE: 1.61   COLONOSCOPY WITH PROPOFOL  N/A 11/05/2017    6 mm polyp in the sigmoid colon which was sessile and removed.  Rectosigmoid colon and sigmoid colon biopsies taken for evaluation for microscopic colitis.  Intermittent rectal bleeding due to sigmoid colon polyp and internal hemorrhoids. Normal colonic biopsies. Tubular adenoma. Repeat surveillance 5-10 years.    COLONOSCOPY WITH PROPOFOL  N/A 03/04/2023   Procedure: COLONOSCOPY WITH PROPOFOL ;  Surgeon: Cindie Carlin POUR, DO;  [facility];  Service: Endoscopy;  Laterality: N/A;  2:15 pm, asa 3   ESOPHAGOGASTRODUODENOSCOPY  03/10/2008   SLF: Normal esophagus without evidence of Barrett, mass, erosion  ulceration, or stricture, small bowel bx negative, gastritis with NO h.pylori   ESOPHAGOGASTRODUODENOSCOPY (EGD) WITH PROPOFOL  N/A 11/05/2017   Mild gastritis and duodenitis.    leocolonoscopy  03/10/2008   DOQ:Wnmfjo terminal ileum, approximately 10 cm visualized/Normal colon without evidence of polyps, mass, inflammatory changes, diverticula, or AVMs/Normal retroflexed view of the rectum. random colon bx negative.   MASTECTOMY     bilateral, status post reconstruction.   OPEN REDUCTION INTERNAL FIXATION (ORIF) DISTAL RADIAL FRACTURE Left 08/16/2023   Procedure: OPEN REDUCTION INTERNAL FIXATION (ORIF) DISTAL RADIUS FRACTURE;  Surgeon: Onesimo Oneil LABOR, MD;  Location: [facility];  Service: Orthopedics;  Laterality: Left;   POLYPECTOMY  11/05/2017   Procedure: POLYPECTOMY;  Surgeon: Harvey Margo CROME, MD;  [facility];  Service: Endoscopy;;  colon   TUBAL LIGATION      Family Psychiatric  History: See below  Family History:  Family History  Problem Relation Age of Onset   COPD Mother    Depression Mother    Heart disease Mother 23       Stents   Hyperlipidemia Mother    Hypertension Mother    Cancer Mother        lung cancer, to bone   Cancer Father        skin   Heart disease Father 48       Stents, pacemaker   Hypertension Father    Hyperlipidemia Father    Learning disabilities Father    Drug abuse Brother        overdose at 39   Drug abuse Daughter        drug overdose 32   Crohn's disease Maternal Aunt    Cancer Maternal Uncle        lung cancer   Crohn's disease Cousin    Colon cancer Neg Hx     Social History:  Social History   Socioeconomic History  Marital status: Divorced    Spouse name: Not on file   Number of children: 2   Years of education: 75   Highest education level: Not on file  Occupational History   Occupation: disabled    Comment: neuropathy from chemo  Tobacco Use   Smoking status: Every Day    Current packs/day: 1.00    Average packs/day: 1 pack/day for 31.9 years (31.9 ttl pk-yrs)    Types: Cigarettes    Start date: 05/07/1992   Smokeless tobacco: Never   Tobacco comments:    discussed  Vaping Use   Vaping status: Never Used  Substance and Sexual Activity   Alcohol use: Not Currently   Drug use: Yes    Frequency: 5.0 times per week    Types: Marijuana   Sexual activity: Yes    Birth control/protection: Post-menopausal  Other Topics Concern   Not on file  Social History Narrative   Divorced.  Lives with boyfriend.   One child passed and one still living, lives in [place], reports sees regularly      Social Drivers of Health   Financial Resource Strain: Low Risk  (09/26/2022)   Overall Financial Resource Strain (CARDIA)    Difficulty of Paying Living Expenses: Not hard at all  Food Insecurity: No Food Insecurity (09/26/2022)   Hunger Vital Sign    Worried About Running Out of Food in the Last Year: Never  true    Ran Out of Food in the Last Year: Never true  Transportation Needs: No Transportation Needs (03/12/2024)   PRAPARE - Administrator, Civil Service (Medical): No    Lack of Transportation (Non-Medical): No  Physical Activity: Inactive (03/12/2024)   Exercise Vital Sign    Days of Exercise per Week: 0 days    Minutes of Exercise per Session: 0 min  Stress: No Stress Concern Present (03/12/2024)   [employer] of Occupational Health - Occupational Stress Questionnaire    Feeling of Stress: Not at all  Social Connections: Moderately Isolated (03/12/2024)   Social Connection and Isolation Panel    Frequency of Communication with Friends and Family: More than three times a week    Frequency of Social Gatherings with Friends and Family: Once a week    Attends Religious Services: Never    Database Administrator or Organizations: No    Attends Banker Meetings: Never    Marital Status: Living with partner    Allergies:  Allergies  Allergen Reactions   Other Nausea And Vomiting    anesthesia   Percocet [Oxycodone -Acetaminophen ] Nausea Only   Vicodin [Hydrocodone -Acetaminophen ] Nausea Only    Metabolic Disorder Labs: Lab Results  Component Value Date   HGBA1C 5.4 01/21/2024   No results found for: PROLACTIN Lab Results  Component Value Date   CHOL 226 (H) 06/03/2023   TRIG 106 06/03/2023   HDL 57 06/03/2023   CHOLHDL 4.0 06/03/2023   VLDL 42 (H) 11/23/2016   LDLCALC 150 (H) 06/03/2023   LDLCALC 99 12/18/2019   Lab Results  Component Value Date   TSH 2.080 01/21/2024   TSH 2.480 09/21/2022    Therapeutic Level Labs: No results found for: LITHIUM No results found for: VALPROATE No results found for: CBMZ  Current Medications: Current Outpatient Medications  Medication Sig Dispense Refill   albuterol  (VENTOLIN  HFA) 108 (90 Base) MCG/ACT inhaler INHALE 2 PUFFS BY MOUTH EVERY 6 HOURS AS NEEDED FOR WHEEZING FOR SHORTNESS OF BREATH  9 g 2  citalopram  (CELEXA ) 20 MG tablet Take 1 tablet (20 mg total) by mouth daily. 30 tablet 2   Crisaborole  (EUCRISA ) 2 % OINT Apply small amount to face once daily as needed for eczema of the face. 60 g 4   diazepam  (VALIUM ) 10 MG tablet Take 1 tablet (10 mg total) by mouth 3 (three) times daily. 90 tablet 2   dicyclomine  (BENTYL ) 10 MG capsule TAKE 1 CAPSULE BY MOUTH THREE TIMES DAILY BEFORE MEAL(S) 90 capsule 3   diphenhydrAMINE (BENADRYL) 25 MG tablet Take 50 mg by mouth daily as needed for allergies.     omeprazole  (PRILOSEC) 40 MG capsule Take 1 capsule (40 mg total) by mouth daily. 90 capsule 4   prazosin  (MINIPRESS ) 2 MG capsule Take 1 capsule (2 mg total) by mouth at bedtime. 30 capsule 2   temazepam  (RESTORIL ) 30 MG capsule Take 1 capsule (30 mg total) by mouth at bedtime. 30 capsule 2   triamcinolone  ointment (KENALOG ) 0.5 % Apply 1 Application topically 2 (two) times daily as needed (dyshidrotic eczema of the hands for up to 14 days per flare up). 30 g 5   varenicline  (CHANTIX  CONTINUING MONTH PAK) 1 MG tablet Take 1 tablet (1 mg total) by mouth 2 (two) times daily. Start month #2 60 tablet PRN   Varenicline  Tartrate, Starter, (CHANTIX  STARTING MONTH PAK) 0.5 MG X 11 & 1 MG X 42 TBPK Take 0.5 mg tablet by mouth once daily x3 days, then 0.5 mg tablet twice daily x4 days, then increase to one 1 mg tablet twice daily. Then call for refill 53 each 0   No current facility-administered medications for this visit.     Musculoskeletal: Strength & Muscle Tone: na Gait & Station: na Patient leans: N/A  Psychiatric Specialty Exam: Review of Systems  All other systems reviewed and are negative.   Last menstrual period 07/09/2013.There is no height or weight on file to calculate BMI.  General Appearance: NA  Eye Contact:  NA  Speech:  Clear and Coherent  Volume:  Normal  Mood:  Euthymic  Affect:  NA  Thought Process:  Goal Directed  Orientation:  Full (Time, Place, and Person)   Thought Content: Rumination   Suicidal Thoughts:  No  Homicidal Thoughts:  No  Memory:  Immediate;   Good Recent;   Good Remote;   Good  Judgement:  Good  Insight:  Fair  Psychomotor Activity:  Normal  Concentration:  Concentration: Good and Attention Span: Good  Recall:  Good  Fund of Knowledge: Fair  Language: Good  Akathisia:  No  Handed:  Right  AIMS (if indicated): not done  Assets:  Communication Skills Desire for Improvement Resilience Social Support  ADL's:  Intact  Cognition: WNL  Sleep:  Good   Screenings: AUDIT    Flowsheet Row Clinical Support from 04/26/2021 in [facility]  Alcohol Use Disorder Identification Test Final Score (AUDIT) 4   GAD-7    Flowsheet Row Office Visit from 01/21/2024 in [facility] Office Visit from 06/03/2023 in [facility] Office Visit from 09/21/2022 in [facility] Office Visit from 01/10/2022 in [facility] Office Visit from 05/31/2021 in [facility]  Total GAD-7 Score 21 21 16 19 19    PHQ2-9    Flowsheet Row Clinical Support from 03/12/2024 in [facility] Office Visit from 01/21/2024 in  [facility] Office Visit from 06/03/2023 in [facility] Clinical Support from 09/26/2022 in [facility] Office Visit from 09/21/2022 in [facility]  PHQ-2 Total Score 6 6 6 4 6   PHQ-9 Total Score 21 26 22 11 12    Flowsheet Row Clinical Support from 03/12/2024 in [facility] Admission (Discharged) from 08/16/2023 in [facility] from 08/11/2023 in [facility] Department at [facility]  C-SSRS RISK CATEGORY High Risk  No Risk No Risk     Assessment and Plan: This patient is a 63 year old female with a history of major depression, generalized anxiety and posttraumatic stress disorder.  She continues to do well on her current regimen.  She will continue Celexa  20 mg daily for major depression, Valium  10 mg 3 times daily for generalized anxiety, Restoril  30 mg at bedtime for sleep and prazosin  2 mg at bedtime for nightmares both from posttraumatic stress disorder.  She will return to see me in 3 months  Collaboration of Care: Collaboration of Care: Primary Care Provider AEB notes will be shared with PCP at patient's request  Patient/Guardian was advised Release of Information must be obtained prior to any record release in order to collaborate their care with an outside provider. Patient/Guardian was advised if they have not already done so to contact the registration department to sign all necessary forms in order for us  to release information regarding their care.   Consent: Patient/Guardian gives verbal consent for treatment and assignment of benefits for services provided during this visit. Patient/Guardian expressed understanding and agreed to proceed.    Barnie Gull, MD 03/17/2024, 1:43 PM

## 2024-06-17 ENCOUNTER — Telehealth (HOSPITAL_COMMUNITY): Admitting: Psychiatry

## 2024-06-18 ENCOUNTER — Telehealth (HOSPITAL_COMMUNITY): Admitting: Psychiatry

## 2025-03-16 ENCOUNTER — Ambulatory Visit
